# Patient Record
Sex: Female | Born: 1950 | Hispanic: No | Marital: Single | Smoking: Never smoker
Health system: Southern US, Community
[De-identification: ages and names within clinical notes are randomized; demographics above are authoritative.]

## PROBLEM LIST (undated history)

## (undated) DIAGNOSIS — K8020 Calculus of gallbladder without cholecystitis without obstruction: Secondary | ICD-10-CM

## (undated) DIAGNOSIS — C801 Malignant (primary) neoplasm, unspecified: Secondary | ICD-10-CM

## (undated) DIAGNOSIS — C7949 Secondary malignant neoplasm of other parts of nervous system: Secondary | ICD-10-CM

## (undated) DIAGNOSIS — Z9221 Personal history of antineoplastic chemotherapy: Secondary | ICD-10-CM

## (undated) DIAGNOSIS — R94131 Abnormal electromyogram [EMG]: Secondary | ICD-10-CM

## (undated) DIAGNOSIS — C7951 Secondary malignant neoplasm of bone: Secondary | ICD-10-CM

## (undated) DIAGNOSIS — D649 Anemia, unspecified: Secondary | ICD-10-CM

## (undated) DIAGNOSIS — C3490 Malignant neoplasm of unspecified part of unspecified bronchus or lung: Secondary | ICD-10-CM

## (undated) DIAGNOSIS — Z923 Personal history of irradiation: Secondary | ICD-10-CM

## (undated) DIAGNOSIS — E039 Hypothyroidism, unspecified: Secondary | ICD-10-CM

## (undated) DIAGNOSIS — M25511 Pain in right shoulder: Secondary | ICD-10-CM

## (undated) DIAGNOSIS — R599 Enlarged lymph nodes, unspecified: Secondary | ICD-10-CM

## (undated) DIAGNOSIS — R0602 Shortness of breath: Secondary | ICD-10-CM

## (undated) DIAGNOSIS — I639 Cerebral infarction, unspecified: Secondary | ICD-10-CM

## (undated) DIAGNOSIS — D242 Benign neoplasm of left breast: Secondary | ICD-10-CM

## (undated) DIAGNOSIS — C50919 Malignant neoplasm of unspecified site of unspecified female breast: Secondary | ICD-10-CM

## (undated) DIAGNOSIS — Z79811 Long term (current) use of aromatase inhibitors: Secondary | ICD-10-CM

## (undated) DIAGNOSIS — R05 Cough: Secondary | ICD-10-CM

## (undated) DIAGNOSIS — C7931 Secondary malignant neoplasm of brain: Secondary | ICD-10-CM

## (undated) DIAGNOSIS — R058 Other specified cough: Secondary | ICD-10-CM

## (undated) HISTORY — DX: Anemia, unspecified: D64.9

## (undated) HISTORY — PX: TONSILLECTOMY: SUR1361

## (undated) HISTORY — DX: Malignant (primary) neoplasm, unspecified: C80.1

## (undated) HISTORY — DX: Benign neoplasm of left breast: D24.2

## (undated) HISTORY — DX: Cerebral infarction, unspecified: I63.9

## (undated) HISTORY — DX: Abnormal electromyogram (EMG): R94.131

## (undated) HISTORY — DX: Hypothyroidism, unspecified: E03.9

## (undated) HISTORY — DX: Malignant neoplasm of unspecified part of unspecified bronchus or lung: C34.90

## (undated) HISTORY — DX: Personal history of irradiation: Z92.3

## (undated) HISTORY — DX: Malignant neoplasm of unspecified site of unspecified female breast: C50.919

## (undated) HISTORY — DX: Enlarged lymph nodes, unspecified: R59.9

## (undated) HISTORY — DX: Calculus of gallbladder without cholecystitis without obstruction: K80.20

---

## 1998-09-03 ENCOUNTER — Ambulatory Visit (HOSPITAL_COMMUNITY): Admission: RE | Admit: 1998-09-03 | Discharge: 1998-09-03 | Payer: Self-pay | Admitting: Family Medicine

## 1998-09-03 ENCOUNTER — Encounter: Payer: Self-pay | Admitting: Family Medicine

## 1998-09-15 ENCOUNTER — Ambulatory Visit (HOSPITAL_COMMUNITY): Admission: RE | Admit: 1998-09-15 | Discharge: 1998-09-15 | Payer: Self-pay | Admitting: Family Medicine

## 1998-09-18 ENCOUNTER — Encounter: Payer: Self-pay | Admitting: Family Medicine

## 1998-09-18 ENCOUNTER — Ambulatory Visit (HOSPITAL_COMMUNITY): Admission: RE | Admit: 1998-09-18 | Discharge: 1998-09-18 | Payer: Self-pay | Admitting: Family Medicine

## 2000-01-28 ENCOUNTER — Ambulatory Visit (HOSPITAL_COMMUNITY): Admission: RE | Admit: 2000-01-28 | Discharge: 2000-01-28 | Payer: Self-pay | Admitting: General Surgery

## 2001-03-29 ENCOUNTER — Encounter (INDEPENDENT_AMBULATORY_CARE_PROVIDER_SITE_OTHER): Payer: Self-pay | Admitting: *Deleted

## 2001-03-29 ENCOUNTER — Other Ambulatory Visit: Admission: RE | Admit: 2001-03-29 | Discharge: 2001-03-29 | Payer: Self-pay | Admitting: Family Medicine

## 2001-03-29 ENCOUNTER — Encounter: Admission: RE | Admit: 2001-03-29 | Discharge: 2001-03-29 | Payer: Self-pay | Admitting: Family Medicine

## 2001-03-29 ENCOUNTER — Encounter: Payer: Self-pay | Admitting: Family Medicine

## 2001-04-30 DIAGNOSIS — D242 Benign neoplasm of left breast: Secondary | ICD-10-CM

## 2001-04-30 DIAGNOSIS — C50919 Malignant neoplasm of unspecified site of unspecified female breast: Secondary | ICD-10-CM

## 2001-04-30 HISTORY — PX: BREAST LUMPECTOMY: SHX2

## 2001-04-30 HISTORY — DX: Benign neoplasm of left breast: D24.2

## 2001-04-30 HISTORY — DX: Malignant neoplasm of unspecified site of unspecified female breast: C50.919

## 2001-05-15 ENCOUNTER — Encounter: Payer: Self-pay | Admitting: General Surgery

## 2001-05-15 ENCOUNTER — Encounter: Admission: RE | Admit: 2001-05-15 | Discharge: 2001-05-15 | Payer: Self-pay | Admitting: General Surgery

## 2001-05-16 ENCOUNTER — Encounter: Admission: RE | Admit: 2001-05-16 | Discharge: 2001-05-16 | Payer: Self-pay | Admitting: General Surgery

## 2001-05-16 ENCOUNTER — Encounter (INDEPENDENT_AMBULATORY_CARE_PROVIDER_SITE_OTHER): Payer: Self-pay | Admitting: *Deleted

## 2001-05-16 ENCOUNTER — Ambulatory Visit (HOSPITAL_BASED_OUTPATIENT_CLINIC_OR_DEPARTMENT_OTHER): Admission: RE | Admit: 2001-05-16 | Discharge: 2001-05-16 | Payer: Self-pay | Admitting: General Surgery

## 2001-05-16 ENCOUNTER — Encounter: Payer: Self-pay | Admitting: General Surgery

## 2001-05-16 HISTORY — PX: BREAST LUMPECTOMY: SHX2

## 2001-05-25 ENCOUNTER — Ambulatory Visit: Admission: RE | Admit: 2001-05-25 | Discharge: 2001-08-23 | Payer: Self-pay | Admitting: Radiation Oncology

## 2001-06-13 ENCOUNTER — Encounter: Payer: Self-pay | Admitting: *Deleted

## 2001-06-13 ENCOUNTER — Ambulatory Visit (HOSPITAL_COMMUNITY): Admission: RE | Admit: 2001-06-13 | Discharge: 2001-06-13 | Payer: Self-pay | Admitting: *Deleted

## 2001-06-20 ENCOUNTER — Ambulatory Visit (HOSPITAL_COMMUNITY): Admission: RE | Admit: 2001-06-20 | Discharge: 2001-06-20 | Payer: Self-pay | Admitting: *Deleted

## 2001-06-20 ENCOUNTER — Encounter: Payer: Self-pay | Admitting: *Deleted

## 2001-09-26 ENCOUNTER — Ambulatory Visit: Admission: RE | Admit: 2001-09-26 | Discharge: 2001-12-25 | Payer: Self-pay | Admitting: Radiation Oncology

## 2001-12-14 ENCOUNTER — Encounter: Admission: RE | Admit: 2001-12-14 | Discharge: 2001-12-14 | Payer: Self-pay | Admitting: Radiation Oncology

## 2001-12-28 ENCOUNTER — Ambulatory Visit: Admission: RE | Admit: 2001-12-28 | Discharge: 2002-03-28 | Payer: Self-pay | Admitting: Radiation Oncology

## 2003-01-02 ENCOUNTER — Encounter: Admission: RE | Admit: 2003-01-02 | Discharge: 2003-01-02 | Payer: Self-pay | Admitting: *Deleted

## 2003-01-02 ENCOUNTER — Encounter: Payer: Self-pay | Admitting: *Deleted

## 2003-08-15 ENCOUNTER — Encounter: Admission: RE | Admit: 2003-08-15 | Discharge: 2003-08-15 | Payer: Self-pay | Admitting: Radiation Oncology

## 2003-11-17 ENCOUNTER — Encounter: Admission: RE | Admit: 2003-11-17 | Discharge: 2003-11-17 | Payer: Self-pay | Admitting: Family Medicine

## 2004-03-31 ENCOUNTER — Encounter (INDEPENDENT_AMBULATORY_CARE_PROVIDER_SITE_OTHER): Payer: Self-pay | Admitting: *Deleted

## 2004-03-31 LAB — CONVERTED CEMR LAB

## 2004-04-27 ENCOUNTER — Encounter: Admission: RE | Admit: 2004-04-27 | Discharge: 2004-04-27 | Payer: Self-pay | Admitting: Family Medicine

## 2004-08-18 ENCOUNTER — Encounter: Admission: RE | Admit: 2004-08-18 | Discharge: 2004-08-18 | Payer: Self-pay

## 2004-12-01 ENCOUNTER — Ambulatory Visit: Payer: Self-pay | Admitting: Family Medicine

## 2005-03-09 ENCOUNTER — Ambulatory Visit: Payer: Self-pay | Admitting: Family Medicine

## 2005-09-01 ENCOUNTER — Encounter: Admission: RE | Admit: 2005-09-01 | Discharge: 2005-09-01 | Payer: Self-pay | Admitting: General Surgery

## 2006-02-21 ENCOUNTER — Encounter: Admission: RE | Admit: 2006-02-21 | Discharge: 2006-02-21 | Payer: Self-pay | Admitting: Family Medicine

## 2006-03-01 ENCOUNTER — Encounter: Payer: Self-pay | Admitting: Family Medicine

## 2006-05-17 ENCOUNTER — Ambulatory Visit: Payer: Self-pay

## 2006-12-28 DIAGNOSIS — C50919 Malignant neoplasm of unspecified site of unspecified female breast: Secondary | ICD-10-CM | POA: Insufficient documentation

## 2006-12-28 DIAGNOSIS — E039 Hypothyroidism, unspecified: Secondary | ICD-10-CM | POA: Insufficient documentation

## 2006-12-28 DIAGNOSIS — R7303 Prediabetes: Secondary | ICD-10-CM

## 2006-12-29 ENCOUNTER — Encounter (INDEPENDENT_AMBULATORY_CARE_PROVIDER_SITE_OTHER): Payer: Self-pay | Admitting: *Deleted

## 2007-10-15 ENCOUNTER — Telehealth: Payer: Self-pay | Admitting: Family Medicine

## 2007-12-11 ENCOUNTER — Ambulatory Visit: Payer: Self-pay | Admitting: Family Medicine

## 2007-12-11 ENCOUNTER — Encounter: Payer: Self-pay | Admitting: Family Medicine

## 2007-12-13 LAB — CONVERTED CEMR LAB
AST: 15 units/L (ref 0–37)
Albumin: 4.6 g/dL (ref 3.5–5.2)
BUN: 10 mg/dL (ref 6–23)
CO2: 19 meq/L (ref 19–32)
Calcium: 9.5 mg/dL (ref 8.4–10.5)
Chloride: 107 meq/L (ref 96–112)
Cholesterol: 204 mg/dL — ABNORMAL HIGH (ref 0–200)
Creatinine, Ser: 0.75 mg/dL (ref 0.40–1.20)
Glucose, Bld: 110 mg/dL — ABNORMAL HIGH (ref 70–99)
HCT: 39.4 % (ref 36.0–46.0)
HDL: 44 mg/dL (ref >39)
Hemoglobin: 13.1 g/dL (ref 12.0–15.0)
RBC: 4.7 M/uL (ref 3.87–5.11)
RDW: 13.4 % (ref 11.5–15.5)
Total CHOL/HDL Ratio: 4.6
Triglycerides: 133 mg/dL (ref <150)

## 2007-12-14 ENCOUNTER — Telehealth: Payer: Self-pay | Admitting: *Deleted

## 2008-01-16 ENCOUNTER — Telehealth: Payer: Self-pay | Admitting: Family Medicine

## 2008-04-03 ENCOUNTER — Encounter: Admission: RE | Admit: 2008-04-03 | Discharge: 2008-04-03 | Payer: Self-pay | Admitting: Family Medicine

## 2008-07-09 ENCOUNTER — Telehealth: Payer: Self-pay | Admitting: Family Medicine

## 2008-07-24 ENCOUNTER — Telehealth: Payer: Self-pay | Admitting: Family Medicine

## 2008-07-24 ENCOUNTER — Encounter (INDEPENDENT_AMBULATORY_CARE_PROVIDER_SITE_OTHER): Payer: Self-pay | Admitting: *Deleted

## 2009-04-06 ENCOUNTER — Telehealth: Payer: Self-pay | Admitting: Family Medicine

## 2009-04-08 ENCOUNTER — Encounter: Admission: RE | Admit: 2009-04-08 | Discharge: 2009-04-08 | Payer: Self-pay | Admitting: Family Medicine

## 2009-04-09 ENCOUNTER — Ambulatory Visit: Payer: Self-pay | Admitting: Family Medicine

## 2009-04-09 ENCOUNTER — Encounter: Payer: Self-pay | Admitting: Family Medicine

## 2009-04-10 ENCOUNTER — Encounter: Payer: Self-pay | Admitting: Family Medicine

## 2009-04-13 ENCOUNTER — Telehealth: Payer: Self-pay | Admitting: Family Medicine

## 2009-04-16 ENCOUNTER — Telehealth: Payer: Self-pay | Admitting: Family Medicine

## 2010-04-09 ENCOUNTER — Ambulatory Visit: Payer: Self-pay | Admitting: Family Medicine

## 2010-04-09 ENCOUNTER — Encounter: Payer: Self-pay | Admitting: Family Medicine

## 2010-04-09 DIAGNOSIS — G4700 Insomnia, unspecified: Secondary | ICD-10-CM | POA: Insufficient documentation

## 2010-04-09 LAB — CONVERTED CEMR LAB
ALT: 17 units/L (ref 0–35)
Bilirubin Urine: NEGATIVE
Blood in Urine, dipstick: NEGATIVE
CO2: 24 meq/L (ref 19–32)
Calcium: 9.5 mg/dL (ref 8.4–10.5)
Chloride: 103 meq/L (ref 96–112)
Creatinine, Ser: 0.93 mg/dL (ref 0.40–1.20)
Glucose, Bld: 125 mg/dL — ABNORMAL HIGH (ref 70–99)
Glucose, Urine, Semiquant: NEGATIVE
HCT: 35.9 % — ABNORMAL LOW (ref 36.0–46.0)
Ketones, urine, test strip: NEGATIVE
MCV: 87.3 fL (ref 78.0–100.0)
Platelets: 250 10*3/uL (ref 150–400)
Protein, U semiquant: NEGATIVE
RBC: 4.11 M/uL (ref 3.87–5.11)
TSH: 14.954 microintl units/mL — ABNORMAL HIGH (ref 0.350–4.500)
Total Bilirubin: 0.4 mg/dL (ref 0.3–1.2)
Total Protein: 7.4 g/dL (ref 6.0–8.3)
Urobilinogen, UA: 0.2
WBC: 7 10*3/uL (ref 4.0–10.5)
pH: 5

## 2010-08-17 ENCOUNTER — Ambulatory Visit: Payer: Self-pay | Admitting: Family Medicine

## 2010-08-17 ENCOUNTER — Encounter: Admission: RE | Admit: 2010-08-17 | Discharge: 2010-08-17 | Payer: Self-pay | Admitting: Family Medicine

## 2010-08-17 ENCOUNTER — Encounter: Payer: Self-pay | Admitting: Family Medicine

## 2010-08-17 DIAGNOSIS — Z780 Asymptomatic menopausal state: Secondary | ICD-10-CM | POA: Insufficient documentation

## 2010-08-18 ENCOUNTER — Encounter: Payer: Self-pay | Admitting: Family Medicine

## 2010-08-18 LAB — CONVERTED CEMR LAB
ALT: 16 units/L (ref 0–35)
AST: 15 units/L (ref 0–37)
CO2: 23 meq/L (ref 19–32)
Chloride: 104 meq/L (ref 96–112)
Cholesterol: 188 mg/dL (ref 0–200)
Creatinine, Ser: 0.79 mg/dL (ref 0.40–1.20)
HCT: 38.5 % (ref 36.0–46.0)
MCHC: 32.5 g/dL (ref 30.0–36.0)
MCV: 84.1 fL (ref 78.0–100.0)
Platelets: 258 10*3/uL (ref 150–400)
RDW: 14.3 % (ref 11.5–15.5)
Sodium: 138 meq/L (ref 135–145)
TSH: 0.032 microintl units/mL — ABNORMAL LOW (ref 0.350–4.500)
Total Bilirubin: 0.4 mg/dL (ref 0.3–1.2)
Total Protein: 7.4 g/dL (ref 6.0–8.3)
Vit D, 25-Hydroxy: 24 ng/mL — ABNORMAL LOW (ref 30–89)
WBC: 7.4 10*3/uL (ref 4.0–10.5)

## 2010-09-02 ENCOUNTER — Telehealth: Payer: Self-pay | Admitting: *Deleted

## 2010-09-09 ENCOUNTER — Encounter: Payer: Self-pay | Admitting: Family Medicine

## 2010-09-15 ENCOUNTER — Telehealth: Payer: Self-pay | Admitting: Family Medicine

## 2010-09-20 DIAGNOSIS — K029 Dental caries, unspecified: Secondary | ICD-10-CM | POA: Insufficient documentation

## 2010-11-20 ENCOUNTER — Encounter: Payer: Self-pay | Admitting: General Surgery

## 2010-11-21 ENCOUNTER — Encounter: Payer: Self-pay | Admitting: General Surgery

## 2010-11-21 ENCOUNTER — Encounter: Payer: Self-pay | Admitting: Family Medicine

## 2010-11-22 ENCOUNTER — Encounter: Payer: Self-pay | Admitting: Family Medicine

## 2010-12-02 NOTE — Progress Notes (Signed)
Summary: referral question  Phone Note Call from Patient Call back at Home Phone [phone]   Caller: Patient Summary of Call: pt is requesting to know about dental referral Initial call taken by: De Nurse,  September 02, 2010 4:56 PM  Follow-up for Phone Call        was faxed on 08-17-10 Follow-up by: Arlyss Repress CMA,,  September 02, 2010 5:39 PM

## 2010-12-02 NOTE — Assessment & Plan Note (Signed)
Summary: chest discomfort & stomach discomfort,tcb   Vital Signs:  Patient profile:   60 year old female Weight:      136 pounds Pulse rate:   72 / minute BP sitting:   92 / 62  (left arm)  Vitals Entered By: Renato Battles slade,cma CC: chest and abdomen discomfort x 1 week. increased urination x 1 week. Is Patient Diabetic? No Pain Assessment Patient in pain? yes     Location: stomach Intensity: 7 Onset of pain  x 1 week   Primary Care Provider:  Jamie Brookes MD  CC:  chest and abdomen discomfort x 1 week. increased urination x 1 week.Marland Kitchen  History of Present Illness: Many complaints:  Thyroid problems-feels that her dose is very low cannot sleep gaining weight stomach is large acid reflux worried about diabetes Wants blood work done does not want hemoccult slides done  She wants to be healthy,looking for direction in this area.  She is not sure who she wants to be doctor. She has misunderstanding about being certified by the hospital and having insurance for diagnostic coverage.   Habits & Providers  Alcohol-Tobacco-Diet     Tobacco Status: never  Current Medications (verified): 1)  Levothroid 100 Mcg Tabs (Levothyroxine Sodium) .... One Daily  Allergies (verified): No Known Drug Allergies  Family History: Reviewed history from 12/28/2006 and no changes required. F - CAD, HTN, M - DM, no CA  Social History: Reviewed history from 12/28/2006 and no changes required. Lives alone, divorced, 1 son; no tobacco or EtOH; vegetarian; unemployed, degree in education  Review of Systems General:  Complains of fatigue and sleep disorder; denies sweats. GI:  Complains of abdominal pain, constipation, gas, and indigestion. Endo:  Complains of weight change.  Physical Exam  General:  Well developed, talkative, [place] female Lungs:  normal respiratory effort and normal breath sounds.   Heart:  normal rate and regular rhythm.   Abdomen:  soft, non-tender, normal bowel  sounds, and no distention.   Psych:  full of questions   Impression & Recommendations:  Problem # 1:  HYPOTHYROIDISM, UNSPECIFIED (ICD-244.9) TSH high, increase to 1 mg, recheck in 8 weeks The following medications were removed from the medication list:    Levothroid 50 Mcg Tabs (Levothyroxine sodium) .Marland Kitchen... Take one by mouth daily Her updated medication list for this problem includes:    Levothroid 100 Mcg Tabs (Levothyroxine sodium) ..... One daily  Orders: Comp Met-[facility] [id number]) CBC-[facility] [phone]) TSH-[facility] [phone]) [facility]- Est  Level 4 (99214)Future Orders: TSH-[facility] ([id number]) ... 04/07/2011  Problem # 2:  SCREENING, DIABETES MELLITUS (ICD-V77.1)  [place], not fasting, will have fasting bs done in August when she comes in for blood work, future orders entered  Orders: [facility]- Est  Level 4 (02725)  Problem # 3:  ACID REFLUX DISEASE (ICD-530.81)  recommended OTC H2 blocker for one month then prn The following medications were removed from the medication list:    Nexium 20 Mg Cpdr (Esomeprazole magnesium) .Marland Kitchen... Take one tablet by mouth daily in the morning Her updated medication list for this problem includes:    Pepcid 40 Mg Tabs (Famotidine) ..... Otc daily  Orders: [facility]- Est  Level 4 (36644)  Problem # 4:  INSOMNIA (ICD-780.52) recommended OTC melantonin  Complete Medication List: 1)  Levothroid 100 Mcg Tabs (Levothyroxine sodium) .... One daily 2)  Pepcid 40 Mg Tabs (Famotidine) .... Otc daily  Other Orders: Urinalysis-[facility] (00000) Future Orders: Glucose-[facility] ([id number]) ... 09/02/2011 Vit D, 25 OH-[facility] ([id number]) ... 04/07/2011 Lipid-[facility] (  [phone]) ... 05/06/2011  Patient Instructions: 1)  melatonin 3 mg at bedtime 2)  Pepcid 40 mg, take at betime for one month 3)  Exercise 40 minutes 5 times a week for one month 4)  Take thyroid medicine in the morning 5)  Return in one month Prescriptions: LEVOTHROID 100 MCG TABS (LEVOTHYROXINE SODIUM) one  daily  #90 x 3   Entered and Authorized by:   Luretha Murphy NP   Signed by:   Luretha Murphy NP on 04/12/2010   Method used:   Electronically to        [id number] (retail)       [address]       [place], [place]  [address]       Ph: [phone]       Fax: [phone]   RxID:   [id number]    Prevention & Chronic Care Immunizations   Influenza vaccine: Not documented    Tetanus booster: 10/31/2000: Done.    Pneumococcal vaccine: Not documented  Colorectal Screening   Hemoccult: Not documented    Colonoscopy: Not documented  Other Screening   Pap smear: Done.  (03/31/2004)    Mammogram: BI-RADS CATEGORY 2:  Benign finding(s).^MM DIGITAL DIAGNOSTIC BILAT  (04/08/2009)   Smoking status: never  (04/09/2010)  Diabetes Mellitus   HgbA1C: 6.6   (04/09/2009)    Eye exam: Not documented    Foot exam: Not documented   High risk foot: Not documented   Foot care education: Not documented    Urine microalbumin/creatinine ratio: Not documented  Lipids   Total Cholesterol: 204  (12/11/2007)   LDL: 133  (12/11/2007)   LDL Direct: Not documented   HDL: 44  (12/11/2007)   Triglycerides: 133  (12/11/2007)  Self-Management Support :    Diabetes self-management support: Not documented    Laboratory Results   Urine Tests  Date/Time Received: April 09, 2010 4:05 PM  Date/Time Reported: April 09, 2010 4:14 PM   Routine Urinalysis   Color: yellow Appearance: Clear Glucose: negative   (Normal Range: Negative) Bilirubin: negative   (Normal Range: Negative) Ketone: negative   (Normal Range: Negative) Spec. Gravity: <1.005   (Normal Range: 1.003-1.035) Blood: negative   (Normal Range: Negative) pH: 5.0   (Normal Range: 5.0-8.0) Protein: negative   (Normal Range: Negative) Urobilinogen: 0.2   (Normal Range: 0-1) Nitrite: negative   (Normal Range: Negative) Leukocyte Esterace: negative   (Normal Range: Negative)     Comments: ...............test performed by......Marland KitchenBonnie A. [place], MLS (ASCP)cm

## 2010-12-02 NOTE — Letter (Signed)
Summary: Generic Letter  [facility]  [address]   [place], [place] [address]   Phone: [phone]  Fax: [phone]    08/18/2010  [facility] [address] [place], [place]  [address]  Dear Ms. [facility],   It was nice to see you and talk with you on the phone.  As we discussed I would like you to: 1. Begin a walking program for 30 minutes daily 2. Begin Vitamin D 2000 International Units daily 3. At your next refill, the dosage of your thyroid replacement will change, the prescription is enclosed.  Please schedule a follow up appointment in 3-4 months.       Sincerely,   Luretha Murphy NP

## 2010-12-02 NOTE — Assessment & Plan Note (Signed)
Summary: F/U VISIT/[facility]   Vital Signs:  Patient profile:   60 year old female Height:      60 inches Weight:      131 pounds BMI:     25.68 Temp:     97.7 degrees F oral Pulse rate:   90 / minute BP sitting:   113 / 77  (right arm) Cuff size:   regular  Vitals Entered By: Tessie Fass CMA (August 17, 2010 8:48 AM) CC: F/U Thyroid Is Patient Diabetic? No Pain Assessment Patient in pain? yes     Location: lower back Intensity: 8   Primary Care Provider:  Jamie Brookes MD  CC:  F/U Thyroid.  History of Present Illness: Here to have TSH checked, was elevated at last visit in June, dose was increased to .  She complains of: back pain, worse when she sits for any length of time, ibuprofen is helpful  hair falling out-thinning is around face and on top.  being overweight, abdomen is larger than she would like, exercises some   Habits & Providers  Alcohol-Tobacco-Diet     Tobacco Status: never  Current Medications (verified): 1)  Levothroid 88 Mcg Tabs (Levothyroxine Sodium) .... One Daily (Dosage Change) 2)  Hydrocortisone 2.5 % Crea (Hydrocortisone) .... Apply To Rash Two Times A Day As Needed, 30 Gm 3)  Vitamin D 2000 Unit Caps (Cholecalciferol) .... One Daily  Allergies (verified): No Known Drug Allergies  Social History: Lives alone, divorced, 1 son; no tobacco or EtOH; vegetarian; unemployed, degree in education Originally from [place]  Review of Systems General:  Denies fatigue, malaise, sweats, and weight loss. CV:  Denies chest pain or discomfort. Resp:  Denies cough and shortness of breath. GI:  Denies abdominal pain and constipation. GU:  Denies discharge and dysuria. MS:  Complains of low back pain. Derm:  Complains of rash. Endo:  Denies cold intolerance, excessive hunger, excessive thirst, excessive urination, heat intolerance, polyuria, and weight change.  Physical Exam  General:  Well appearing [place] female, talks very fast. Mouth:   moderate gingivitis, cares and few broken teeth Lungs:  normal respiratory effort and normal breath sounds.   Heart:  Rate 90, regular, no murmur Abdomen:  distended but soft; active bowel sounds Msk:  normal ROM, no joint tenderness, and no joint swelling.     Detailed Back/Spine Exam  General:    Well-developed, well-nourished, in no acute distress; alert and oriented x 3.    Lumbosacral Exam:  Inspection-deformity:    Normal    normal lordosis Palpation-spinal tenderness:  Normal Range of Motion:    Forward Flexion:   90 degrees    Hyperextension:   5 degrees    Right Lateral Bend:   25 degrees    Left Lateral Bend:   25 degrees Lying Straight Leg Raise:    Right:  negative    Left:  negative Sitting Straight Leg Raise:    Right:  negative    Left:  negative Reverse Straight Leg Raise:    Right:  negative    Left:  negative Contralateral Straight Leg Raise:    Right:  negative    Left:  negative Sciatic Notch:    There is no sciatic notch tenderness. Toe Walking:    Right:  normal Heel Walking:    Right:  normal Patrick's Maneuver:    Right:  negative Fabere Test:    Right:  negative   Impression & Recommendations:  Problem # 1:  HYPOTHYROIDISM, UNSPECIFIED (ICD-244.9)  Overreplaced on 100 micrograms, decrease dosage Her updated medication list for this problem includes:    Levothroid 88 Mcg Tabs (Levothyroxine sodium) ..... One daily (dosage change)  Orders: TSH-[facility] ([id number]) [facility]- Est  Level 4 (99214)  Problem # 2:  LOW BACK PAIN, MILD (ICD-724.2) Gave handout of low back pain exercises, walk daily, stretch daily  Problem # 3:  DENTAL PAIN (ICD-525.9) referral Orders: [facility]- Est  Level 4 (80998) Dental Referral (Dentist)  Problem # 4:  UNSPECIFIED ANEMIA (ICD-285.9) recheck Orders: CBC-[facility] (33825) [facility]- Est  Level 4 (05397)  Problem # 5:  SCREENING, DIABETES MELLITUS (ICD-V77.1) Blood sugar mildely elevated, promote arobic  exercise  Problem # 6:  BREAST CANCER (ICD-174.9) mammogram with no signs of recurrance, she does not see Onc for routine follow up.  Complete Medication List: 1)  Levothroid 88 Mcg Tabs (Levothyroxine sodium) .... One daily (dosage change) 2)  Hydrocortisone 2.5 % Crea (Hydrocortisone) .... Apply to rash two times a day as needed, 30 gm 3)  Vitamin D 2000 Unit Caps (Cholecalciferol) .... One daily  Other Orders: Lipid-[facility] ([id number]) Comp Met-[facility] [id number]) Vit D, 25 OH-[facility] ([id number])  Patient Instructions: 1)  Keep up your exercise 2)  Do back exercises 3)  Change thyroid replacement dosage as instructed 4)  Vitamin D 2000 International Units daily 5)  return in 3 months Prescriptions: LEVOTHROID 88 MCG TABS (LEVOTHYROXINE SODIUM) one daily (dosage change)  #31 x 6   Entered and Authorized by:   Luretha Murphy NP   Signed by:   Luretha Murphy NP on 08/18/2010   Method used:   Electronically to        [id number] (retail)       [address]       [place], [place]  [address]       Ph: [phone]       Fax: [phone]   RxID:   [id number] HYDROCORTISONE 2.5 % CREA (HYDROCORTISONE) apply to rash two times a day as needed, 30 gm  #1 x 1   Entered and Authorized by:   Luretha Murphy NP   Signed by:   Luretha Murphy NP on 08/17/2010   Method used:   Electronically to        [id number] (retail)       [address]       [place], [place]  [address]       Ph: [phone]       Fax: [phone]   RxID:   [id number] LEVOTHROID 100 MCG TABS (LEVOTHYROXINE SODIUM) one daily  #31 x 6   Entered and Authorized by:   Luretha Murphy NP   Signed by:   Luretha Murphy NP on 08/17/2010   Method used:   Electronically to        [id number] (retail)       [address]       [place], [place]  [address]       Ph: [phone]       Fax: [phone]   RxID:   [id number]    Orders Added: 1)  Lipid-[facility]  [[id number]] 2)  Comp Met-[facility] [[id number]] 3)  CBC-[facility] [85027] 4)  TSH-[facility] [[id number]] 5)  Vit D, 25 OH-[facility] [[id number]] 6)  [facility]- Est  Level 4 [37342] 7)  Dental Referral [Dentist]

## 2010-12-02 NOTE — Progress Notes (Signed)
Summary: phn msg  Phone Note Call from Patient Call back at Home Phone [phone]   Caller: Patient Summary of Call: would like to talk to Darl Pikes about pain meds - pls call  Initial call taken by: De Nurse,  September 15, 2010 4:47 PM    New/Updated Medications: PENICILLIN V POTASSIUM 250 MG TABS (PENICILLIN V POTASSIUM) one three times a day for 2 weeks TRAMADOL HCL 50 MG TABS (TRAMADOL HCL) one three times a day as needed pain Prescriptions: TRAMADOL HCL 50 MG TABS (TRAMADOL HCL) one three times a day as needed pain  #90 x 0   Entered and Authorized by:   Luretha Murphy NP   Signed by:   Luretha Murphy NP on 09/20/2010   Method used:   Electronically to        [id number] (retail)       [address]       [place], [place]  [address]       Ph: [phone]       Fax: [phone]   RxID:   [id number] PENICILLIN V POTASSIUM 250 MG TABS (PENICILLIN V POTASSIUM) one three times a day for 2 weeks  #42 x 1   Entered and Authorized by:   Luretha Murphy NP   Signed by:   Luretha Murphy NP on 09/20/2010   Method used:   Electronically to        [id number] (retail)       [address]       [place], [place]  [address]       Ph: [phone]       Fax: [phone]   RxID:   [id number]   Appended Document: dental referral/ts faxed request to the dental clinic.Arlyss Repress CMA,  September 20, 2010 2:43 PM    Clinical Lists Changes  Problems: Added new problem of DENTAL CARIES (ICD-521.00) Orders: Added new Referral order of Dental Referral (Dentist) - Signed

## 2010-12-02 NOTE — Miscellaneous (Signed)
  Clinical Lists Changes  Problems: Removed problem of LOW BACK PAIN, MILD (ICD-724.2) Removed problem of DENTAL PAIN (ICD-525.9) Removed problem of UNSPECIFIED ANEMIA (ICD-285.9) Removed problem of SCREENING OTHER&UNSPEC CARDIOVASCULAR CONDITIONS (ICD-V81.2) Removed problem of SCREENING, DIABETES MELLITUS (ICD-V77.1)

## 2010-12-08 ENCOUNTER — Encounter: Payer: Self-pay | Admitting: *Deleted

## 2011-03-18 NOTE — Op Note (Signed)
[place]  Patient:    Debbie Larsen, Debbie Larsen                         MRN: [id number] Proc. Date: 05/16/01 Adm. Date:  [id number] Attending:  Janalyn Rouse CC:         Arvella Merles, M.D.  Dr. Heath Lark   Operative Report  PREOPERATIVE DIAGNOSIS: 1. Carcinoma of the right breast. 2. Fibroadenoma of the left breast.  POSTOPERATIVE DIAGNOSIS: 1. Carcinoma of the right breast. 2. Fibroadenoma of the left breast.  OPERATION PERFORMED: 1. Blue dye injection. 2. Right axillary sentinel lymph node biopsy. 3. Right partial mastectomy with needle localization and specimen mammography. 4. Excision of left breast mass with needle localization and specimen    mammography.  SURGEON:  Rose Phi. Maple Hudson, M.D.  ANESTHESIA:  General.  DESCRIPTION OF PROCEDURE:  Prior to coming to the operating room the patient had wire localizations done of the presumed fibroadenoma of the left breast and the primary malignant tumor in the right breast.  Also she had 1 mCi of technetium sulfur colloid injected intradermally in the periareolar tissue prior to coming to the operating room.  After she was put to sleep, 5 cc of Lymphazurin blue was injected in the subareolar tissue on the right side and the breast was compressed for five minutes.  After suitable general anesthesia was induced, the patient was placed in supine position with both arms extended on arm boards.  Both breast axillae and arms were prepped and draped in the usual fashion.  We did the left side first which was the presumed benign tumor.  Using the previously placed wire as our guide, a curved incision was made overlying about the 12 oclock position of the left breast and, using the cautery, we identified the wire and then widely excised the wire and surrounding tissue.  Hemostasis was obtained with the cautery.  Specimen mammography confirmed the removal of the lesion. The deeper breast  tissue was approximated with 3-0 Vicryl and the skin with subcuticular 4-0 Monocryl and Steri-Strips.  On the right side, we outlined a curvilinear incision over the presumed area of the primary tumor as defined by the previously placed wire.  This was in the upper outer quadrant of the right breast.  Incision was made and a wide excision was carried out using the wire as a guide.  The specimen was then oriented for the pathologist and submitted for specimen mammography and then to the pathologist for Touch Prep evaluation and margins.  While that was being done, a short transverse incision was made in the right axilla.  We dissected through the subcutaneous tissues to the clavipectoral fascia.   Self-retaining retractor inserted.  Using the Neoprobe as a guide, a hot and blue lymph node was identified and removed.  This was identified as sentinel  node #1.  There was then a deeper node which was not but not very blue and we removed it also and that was submitted as sentinel node #2.  Both of these were then sent to the pathologist for Touch Prep for metastatic disease.  Specimen mammography confirmed removal of the primary lesion and Touch Preps on the margins of the margins of the primary lesion showed clean margins.  The Touch Preps on the nodes were also negative.  Both incisions were closed with 3-0 Vicryl and subcuticular 4-0 Monocryl and Steri-Strips.  Dressing was applied.  The patient was  transferred to the recovery room in satisfactory condition having tolerated the procedure well. DD:  05/16/01 TD:  05/16/01 Job: [id number]

## 2012-01-03 ENCOUNTER — Ambulatory Visit: Payer: Self-pay | Admitting: Family Medicine

## 2012-03-01 ENCOUNTER — Ambulatory Visit (INDEPENDENT_AMBULATORY_CARE_PROVIDER_SITE_OTHER): Payer: Self-pay | Admitting: Family Medicine

## 2012-03-01 ENCOUNTER — Encounter: Payer: Self-pay | Admitting: Family Medicine

## 2012-03-01 VITALS — BP 123/77 | HR 71 | Temp 98.6°F | Wt 135.2 lb

## 2012-03-01 DIAGNOSIS — E039 Hypothyroidism, unspecified: Secondary | ICD-10-CM

## 2012-03-01 DIAGNOSIS — M25511 Pain in right shoulder: Secondary | ICD-10-CM | POA: Insufficient documentation

## 2012-03-01 DIAGNOSIS — R0789 Other chest pain: Secondary | ICD-10-CM

## 2012-03-01 DIAGNOSIS — M5412 Radiculopathy, cervical region: Secondary | ICD-10-CM

## 2012-03-01 LAB — TSH: TSH: 0.104 u[IU]/mL — ABNORMAL LOW (ref 0.350–4.500)

## 2012-03-01 MED ORDER — IBUPROFEN 600 MG PO TABS: 600.0000 mg | ORAL_TABLET | Freq: Four times a day (QID) | ORAL | Status: AC | PRN

## 2012-03-01 NOTE — Assessment & Plan Note (Signed)
No pain currently. Atypical. No consistent with cardiac chest pain. Tenderness to palpation suggest MSK chest pain.  Plan: -NSAIDs -Check TSH, lipids and fasting blood sugar

## 2012-03-01 NOTE — Assessment & Plan Note (Signed)
R shoulder pain with positive impingement signs.  Plan: Stop tylenol Start NAIDs Heat  Check labs to rule out inflammatory arthritis: uric acid, RF, ANA, ESR Patient to follow-up with PCP when back in town in 6 weeks. If labs wnl and pain not significantly improved recommend repeat shoulder exam and steroid injection in office or at [facility]. Patient seems interested in referral to [facility]. She does not seem interested in injection.

## 2012-03-01 NOTE — Assessment & Plan Note (Signed)
Compliant with synthroid. Check TSH.

## 2012-03-01 NOTE — Patient Instructions (Addendum)
Debbie Larsen,  Thank you for coming in today.  Please do the following for your R arm and chest pain: -start motrin 600 mg every 6 hrs for next 2 days, then as needed.  -start exercises -continue to use heat  I will call you with the results of your blood work.  F/u in 6 weeks with your new PCP Dr. Earnest Bailey to follow-up pain and discuss chronic problem.  Dr. Armen Pickup

## 2012-03-01 NOTE — Progress Notes (Signed)
Subjective:     Patient ID: Debbie Larsen, female   DOB: 10-Jun-1951, 61 y.o.   MRN: [id number]  HPI 61 yo F presents for same day visit with the following concerns: 1. Would like TSH checked: taking synthroid. TSH last checked in 2011.  2. R shoulder pain: x 3 weeks. No injury. Pain in posterior shoulder and lateral upper arm. Pain is 8/10. Comes and goes. Pain is exacerbated activity and palpation. She does not work and denies repetitive motion in the shoulder. Pain improved with rest and tylenol. She denies rash and other joint pains.   3. Chest pain: Not currently. Last occurred 3 weeks ago. Located L chest over heart and L lower ribs. Descriebd as sharp. 8/10 in severity.  Comes and goes. Occurs at rest and with activity. Nothing seems to make it better or worse. Associated with nausea, sweating, SOB and fatigue. Not associated with dizziness or presyncope. Not associated with jaw pain. But patient reports that she had an episode of severe R sided jaw pain and tightness (inability to open mouth) yesterday.   Review of Systems As pre HPI     Objective:   Physical Exam BP 123/77  Pulse 71  Temp(Src) 98.6 F (37 C) (Oral)  Wt 135 lb 3.2 oz (61.326 kg) General appearance: alert, cooperative and no distress Neck: no adenopathy, no carotid bruit, no JVD, supple, symmetrical, trachea midline and thyroid not enlarged, symmetric, no tenderness/mass/nodules Lungs: clear to auscultation bilaterally Chest: chest pain reproducible with palpation over precordium and L lower ribs Heart: S1S2, RRR, no murmurs, rubs or gallops Shoulders: symmetrical, little muscle mass, full ROM, tender to palpation anterior shoulder and lateral upper arm. Positve empty-can sign. Motor full. Sensory full. No hand edema. 2+ radial pulse.   Assessment and Plan:  See problem list

## 2012-03-02 LAB — COMPREHENSIVE METABOLIC PANEL
ALT: 16 U/L (ref 0–35)
BUN: 10 mg/dL (ref 6–23)
CO2: 24 mEq/L (ref 19–32)
Calcium: 9.6 mg/dL (ref 8.4–10.5)
Chloride: 106 mEq/L (ref 96–112)
Creat: 0.74 mg/dL (ref 0.50–1.10)
Glucose, Bld: 104 mg/dL — ABNORMAL HIGH (ref 70–99)
Total Bilirubin: 0.3 mg/dL (ref 0.3–1.2)

## 2012-03-02 LAB — LIPID PANEL
Cholesterol: 202 mg/dL — ABNORMAL HIGH (ref 0–200)
HDL: 41 mg/dL (ref >39)
LDL Cholesterol: 124 mg/dL — ABNORMAL HIGH (ref 0–99)
Triglycerides: 185 mg/dL — ABNORMAL HIGH (ref <150)

## 2012-03-02 LAB — RHEUMATOID FACTOR: Rhuematoid fact SerPl-aCnc: <10 IU/mL (ref <=14)

## 2012-03-02 LAB — URIC ACID: Uric Acid, Serum: 6.1 mg/dL (ref 2.4–7.0)

## 2012-03-02 LAB — SEDIMENTATION RATE: Sed Rate: 25 mm/hr — ABNORMAL HIGH (ref 0–22)

## 2012-03-07 ENCOUNTER — Telehealth: Payer: Self-pay | Admitting: Family Medicine

## 2012-03-07 DIAGNOSIS — M5412 Radiculopathy, cervical region: Secondary | ICD-10-CM

## 2012-03-07 NOTE — Telephone Encounter (Signed)
FWD to Dr. Armen Pickup she seen this patient

## 2012-03-07 NOTE — Telephone Encounter (Signed)
Pt is asking to speak to Dr Armen Pickup- was here last week and was supposed to get her results from her test and is insisting that someone call her before the end of today.  Earnest Bailey is her PCP

## 2012-03-08 ENCOUNTER — Encounter: Payer: Self-pay | Admitting: Family Medicine

## 2012-03-08 MED ORDER — LEVOTHYROXINE SODIUM 75 MCG PO TABS: 75.0000 ug | ORAL_TABLET | Freq: Every day | ORAL | Status: DC

## 2012-03-08 NOTE — Telephone Encounter (Signed)
Called patient gave lab results. TSH low. Have decreased synthroid to 75 mcg daily. Will need TSH rechecked in 2-3 months. Will need to f/u with PCP.   Also placed referral to sports medicine for persistent R shoulder pain. Informed patient that she will receive a call from sports medicine.

## 2012-03-08 NOTE — Telephone Encounter (Signed)
Patient is calling back for her results and she has been waiting for a week.  She would like to speak to Dr. Armen Pickup.

## 2012-05-11 ENCOUNTER — Emergency Department (HOSPITAL_COMMUNITY): Payer: Self-pay

## 2012-05-11 ENCOUNTER — Encounter (HOSPITAL_COMMUNITY): Payer: Self-pay | Admitting: *Deleted

## 2012-05-11 ENCOUNTER — Inpatient Hospital Stay (HOSPITAL_COMMUNITY)
Admission: EM | Admit: 2012-05-11 | Discharge: 2012-05-12 | DRG: 066 | Disposition: A | Discharge status: Home / Self Care | Payer: Self-pay | Attending: Family Medicine | Admitting: Family Medicine

## 2012-05-11 DIAGNOSIS — R42 Dizziness and giddiness: Secondary | ICD-10-CM | POA: Diagnosis present

## 2012-05-11 DIAGNOSIS — H8110 Benign paroxysmal vertigo, unspecified ear: Secondary | ICD-10-CM

## 2012-05-11 DIAGNOSIS — I639 Cerebral infarction, unspecified: Secondary | ICD-10-CM | POA: Diagnosis present

## 2012-05-11 DIAGNOSIS — Z853 Personal history of malignant neoplasm of breast: Secondary | ICD-10-CM

## 2012-05-11 DIAGNOSIS — I6350 Cerebral infarction due to unspecified occlusion or stenosis of unspecified cerebral artery: Principal | ICD-10-CM | POA: Diagnosis present

## 2012-05-11 DIAGNOSIS — Z780 Asymptomatic menopausal state: Secondary | ICD-10-CM

## 2012-05-11 DIAGNOSIS — E039 Hypothyroidism, unspecified: Secondary | ICD-10-CM | POA: Diagnosis present

## 2012-05-11 LAB — POCT I-STAT, CHEM 8
BUN: 7 mg/dL (ref 6–23)
Calcium, Ion: 1.17 mmol/L (ref 1.13–1.30)
Chloride: 108 mEq/L (ref 96–112)
Glucose, Bld: 128 mg/dL — ABNORMAL HIGH (ref 70–99)
HCT: 36 % (ref 36.0–46.0)
TCO2: 22 mmol/L (ref 0–100)

## 2012-05-11 LAB — DIFFERENTIAL
Basophils Relative: 0 % (ref 0–1)
Lymphocytes Relative: 29 % (ref 12–46)
Monocytes Relative: 7 % (ref 3–12)
Neutro Abs: 4 10*3/uL (ref 1.7–7.7)
Neutrophils Relative %: 63 % (ref 43–77)

## 2012-05-11 LAB — POCT I-STAT TROPONIN I: Troponin i, poc: 0 ng/mL (ref 0.00–0.08)

## 2012-05-11 LAB — URINALYSIS, ROUTINE W REFLEX MICROSCOPIC
Bilirubin Urine: NEGATIVE
Ketones, ur: NEGATIVE mg/dL
Nitrite: NEGATIVE
Protein, ur: NEGATIVE mg/dL
pH: 6.5 (ref 5.0–8.0)

## 2012-05-11 LAB — PROTIME-INR: INR: 1.09 (ref 0.00–1.49)

## 2012-05-11 LAB — CBC
Hemoglobin: 12.2 g/dL (ref 12.0–15.0)
Hemoglobin: 12.5 g/dL (ref 12.0–15.0)
MCHC: 33.4 g/dL (ref 30.0–36.0)
RBC: 4.33 MIL/uL (ref 3.87–5.11)
RBC: 4.46 MIL/uL (ref 3.87–5.11)
WBC: 6.4 10*3/uL (ref 4.0–10.5)

## 2012-05-11 LAB — APTT: aPTT: 34 seconds (ref 24–37)

## 2012-05-11 LAB — CREATININE, SERUM
Creatinine, Ser: 0.75 mg/dL (ref 0.50–1.10)
GFR calc non Af Amer: >90 mL/min (ref >90)

## 2012-05-11 MED ORDER — MECLIZINE HCL 25 MG PO TABS
25.0000 mg | ORAL_TABLET | Freq: Once | ORAL | Status: AC
  Administered 2012-05-11: 25 mg via ORAL
  Filled 2012-05-11: qty 1

## 2012-05-11 MED ORDER — ACETAMINOPHEN 325 MG PO TABS: 650.0000 mg | ORAL_TABLET | ORAL | Status: DC | PRN

## 2012-05-11 MED ORDER — LEVOTHYROXINE SODIUM 75 MCG PO TABS
75.0000 ug | ORAL_TABLET | Freq: Every day | ORAL | Status: DC
  Administered 2012-05-11: 75 ug via ORAL
  Filled 2012-05-11 (×2): qty 1

## 2012-05-11 MED ORDER — SODIUM CHLORIDE 0.9 % IV SOLN
INTRAVENOUS | Status: DC
  Administered 2012-05-11: 22:00:00 via INTRAVENOUS
  Administered 2012-05-12: 995 mL via INTRAVENOUS

## 2012-05-11 MED ORDER — ASPIRIN 325 MG PO TABS
325.0000 mg | ORAL_TABLET | Freq: Every day | ORAL | Status: DC
  Administered 2012-05-12: 325 mg via ORAL
  Filled 2012-05-11: qty 1

## 2012-05-11 MED ORDER — SIMVASTATIN 40 MG PO TABS
40.0000 mg | ORAL_TABLET | Freq: Every day | ORAL | Status: DC
  Filled 2012-05-11 (×2): qty 1

## 2012-05-11 MED ORDER — SENNOSIDES-DOCUSATE SODIUM 8.6-50 MG PO TABS: 1.0000 | ORAL_TABLET | Freq: Every evening | ORAL | Status: DC | PRN

## 2012-05-11 MED ORDER — ENOXAPARIN SODIUM 40 MG/0.4ML ~~LOC~~ SOLN
40.0000 mg | SUBCUTANEOUS | Status: DC
  Filled 2012-05-11 (×2): qty 0.4

## 2012-05-11 MED ORDER — MECLIZINE HCL 25 MG PO TABS
25.0000 mg | ORAL_TABLET | Freq: Three times a day (TID) | ORAL | Status: DC | PRN
  Administered 2012-05-12 (×3): 25 mg via ORAL
  Filled 2012-05-11 (×3): qty 1

## 2012-05-11 MED ORDER — ONDANSETRON HCL 4 MG/2ML IJ SOLN: 4.0000 mg | Freq: Four times a day (QID) | INTRAMUSCULAR | Status: DC | PRN

## 2012-05-11 MED ORDER — ACETAMINOPHEN 650 MG RE SUPP: 650.0000 mg | RECTAL | Status: DC | PRN

## 2012-05-11 MED ORDER — ASPIRIN 300 MG RE SUPP
300.0000 mg | Freq: Every day | RECTAL | Status: DC
  Filled 2012-05-11 (×2): qty 1

## 2012-05-11 MED ORDER — SODIUM CHLORIDE 0.9 % IV SOLN
Freq: Once | INTRAVENOUS | Status: AC
  Administered 2012-05-11: 15:00:00 via INTRAVENOUS

## 2012-05-11 NOTE — ED Notes (Signed)
Off floor for testing 

## 2012-05-11 NOTE — ED Notes (Signed)
Patient states dizziness improved.

## 2012-05-11 NOTE — ED Notes (Signed)
Pt reports dizziness started Wednesday night after mowing the lawn and showering.  Pt also reports weakness, falling and vomiting Wednesday, but able to keep fluids and food down since. Pt reports laying down increases dizziness. Pt reports chest pain and headache on Wednesday, but denies pain at this time.  Pt reports she feels unsteady walking.  Pt denies history of similar issues

## 2012-05-11 NOTE — ED Provider Notes (Signed)
History     CSN: [id number]  Arrival date & time 05/11/12  4098   First MD Initiated Contact with Patient 05/11/12 1008      Chief Complaint  Patient presents with  . Dizziness  . Nausea    (Consider location/radiation/quality/duration/timing/severity/associated sxs/prior treatment) HPI Comments: Pt is a 61yo female who presents with complaint of dizziness, weakness, nausea, unsteady gait. Pt states two days ago, she mowed the yard, but was feeling well. States was able to eat dinner, go to bed. States woke up early morning not feeling well. States got up, was feeling dizzy, fell while walking to the bathroom. States at that time, had a headache, nausea, diaphoresis, chest pain, unable to walk straght. States had to hold on to things as she walked. States since then, has been feeling dizzy and has had unstable gait. Denies any more chest pain, abdominal pain, nausea, vomiting. States dizziness and nausea worsens when turning her head and laying flat, states room is spinning. Denies dizziness when walking, states "I just feel like i am going to fall." No history of the same     Past Medical History  Diagnosis Date  . BREAST CANCER 04/2001    Qualifier: Diagnosis of  By: Knox Royalty    . 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 Thyroid disease     Past Surgical History  Procedure Date  . Breast lumpectomy 04/2001    excision of L breast fibroadenoma by Dr.   . Mastectomy partial / lumpectomy w/ axillary lymphadenectomy 04/2001    R breast     No family history on file.  History  Substance Use Topics  . Smoking status: Never Smoker   . Smokeless tobacco: Not on file  . Alcohol Use: No    OB History    Grav Para Term Preterm Abortions TAB SAB Ect Mult Living                  Review of Systems  Constitutional: Negative for fever and chills.  HENT: Negative for neck pain and neck stiffness.   Eyes: Negative for photophobia and redness.  Respiratory: Negative.     Cardiovascular: Negative.   Gastrointestinal: Negative for nausea, vomiting and abdominal pain.  Genitourinary: Negative for dysuria.  Musculoskeletal: Positive for gait problem. Negative for joint swelling and arthralgias.  Neurological: Positive for dizziness.    Allergies  Review of patient's allergies indicates no known allergies.  Home Medications   Current Outpatient Rx  Name Route Sig Dispense Refill  . ACETAMINOPHEN 500 MG PO TABS Oral Take 500 mg by mouth every 4 (four) hours as needed. For pain.    Marland Kitchen LEVOTHYROXINE SODIUM 75 MCG PO TABS Oral Take 1 tablet (75 mcg total) by mouth daily. (dosage change) 30 tablet 1    BP 123/77  Pulse 60  Temp 97.7 F (36.5 C) (Oral)  Resp 18  SpO2 99%  Physical Exam  Nursing note and vitals reviewed. Constitutional: She is oriented to person, place, and time. She appears well-developed and well-nourished. No distress.  HENT:  Head: Normocephalic and atraumatic.  Eyes: Conjunctivae and EOM are normal. Pupils are equal, round, and reactive to light.       No nystagmus  Neck: Neck supple.  Cardiovascular: Normal rate, regular rhythm and normal heart sounds.   Pulmonary/Chest: Effort normal and breath sounds normal. No respiratory distress. She has no wheezes. She has no rales.  Abdominal: Soft. Bowel sounds are normal. She exhibits no distension. There  is no tenderness.  Musculoskeletal: Normal range of motion. She exhibits no edema.  Neurological: She is alert and oriented to person, place, and time. No cranial nerve deficit. Coordination normal.       No pronator drift, normal coordination finger to nose, 5/5 and equal grip strength bilaterally.   Skin: Skin is warm and dry.  Psychiatric: She has a normal mood and affect.    ED Course  Procedures (including critical care time)   Date: 05/11/2012  Rate: 52  Rhythm: sinus bradycardia  QRS Axis: normal  Intervals: normal  ST/T Wave abnormalities: normal  Conduction  Disutrbances:none  Narrative Interpretation:   Old EKG Reviewed: none available  Pt with vertigo like symptoms vs a posterior stroke. Will get labs, MRI, meclizine.   Results for orders placed during the hospital encounter of 05/11/12  POCT I-STAT, CHEM 8      Component Value Range   Sodium 141  135 - 145 mEq/L   Potassium 4.1  3.5 - 5.1 mEq/L   Chloride 108  96 - 112 mEq/L   BUN 7  6 - 23 mg/dL   Creatinine, Ser 1.61  0.50 - 1.10 mg/dL   Glucose, Bld 096 (*) 70 - 99 mg/dL   Calcium, Ion 0.45  4.09 - 1.30 mmol/L   TCO2 22  0 - 100 mmol/L   Hemoglobin 12.2  12.0 - 15.0 g/dL   HCT 81.1  91.4 - 78.2 %  POCT I-STAT TROPONIN I      Component Value Range   Troponin i, poc 0.00  0.00 - 0.08 ng/mL   Comment 3           CBC      Component Value Range   WBC 6.4  4.0 - 10.5 K/uL   RBC 4.33  3.87 - 5.11 MIL/uL   Hemoglobin 12.2  12.0 - 15.0 g/dL   HCT 95.6  21.3 - 08.6 %   MCV 84.3  78.0 - 100.0 fL   MCH 28.2  26.0 - 34.0 pg   MCHC 33.4  30.0 - 36.0 g/dL   RDW 57.8  46.9 - 62.9 %   Platelets 238  150 - 400 K/uL  DIFFERENTIAL      Component Value Range   Neutrophils Relative 63  43 - 77 %   Neutro Abs 4.0  1.7 - 7.7 K/uL   Lymphocytes Relative 29  12 - 46 %   Lymphs Abs 1.9  0.7 - 4.0 K/uL   Monocytes Relative 7  3 - 12 %   Monocytes Absolute 0.4  0.1 - 1.0 K/uL   Eosinophils Relative 1  0 - 5 %   Eosinophils Absolute 0.0  0.0 - 0.7 K/uL   Basophils Relative 0  0 - 1 %   Basophils Absolute 0.0  0.0 - 0.1 K/uL  PROTIME-INR      Component Value Range   Prothrombin Time 14.3  11.6 - 15.2 seconds   INR 1.09  0.00 - 1.49  APTT      Component Value Range   aPTT 34  24 - 37 seconds   Mr Brain Wo Contrast  05/11/2012  *RADIOLOGY REPORT*  Clinical Data: Sudden onset of nausea and dizziness.  MRI HEAD WITHOUT CONTRAST  Technique:  Multiplanar, multiecho pulse sequences of the brain and surrounding structures were obtained according to standard protocol without intravenous contrast.   Comparison: None.  Findings: Diffusion imaging shows a punctate (2 mm) acute infarction affecting the left side  of the pons.  Otherwise, the brain has a normal appearance without any evidence of other old or acute infarction, mass lesion, hemorrhage, hydrocephalus or extra- axial collection.  No pituitary mass.  Sinuses, middle ears and mastoids are clear.  No skull or skull base lesion.  IMPRESSION: 2 mm acute infarction affecting the left side of the pons. Otherwise completely normal exam.  Original Report Authenticated By: Thomasenia Sales, M.D.    MRI positive for 2mm acute infarction in the left pons. Results discussed with pt and family in detail several times. Pt is a family practice pt. Will transfer.   Spoke with family practice will put in orders.   1. CVA (cerebral infarction)       MDM          Lottie Mussel, PA 05/11/12 1739

## 2012-05-11 NOTE — ED Notes (Addendum)
Patient removed tele leads-stated she doesn't need-refused to be placed back on monitoring-PA notified

## 2012-05-11 NOTE — ED Notes (Signed)
Patient ambulated 40 feet with no assistance. Patient's heart rate was constant at 64 beats a minute and O2 saturation was constant at 97%.

## 2012-05-11 NOTE — ED Notes (Signed)
Patient went to bathroom and did not get sample-encouraged patient to notify staff when she needs to go again

## 2012-05-11 NOTE — H&P (Signed)
Family Medicine Teaching [facility] Admission History and Physical  Patient name: Laressa Bolinger Medical record number: [id number] Date of birth: [birth year] Age: 61 y.o. Gender: female  Primary Care Provider: Delbert Harness, MD  Chief Complaint: dizziness History of Present Illness: Revonda Ivori Storr is a 61 y.o. year old female with hypothyroidism presenting with acute onset on dizziness yesterday morning.  She was in her usual state of good health until yesterday morning.  She reports being very dizzy and unsteady on her feet since she woke up yesterday with multiple falls over the course of the day.  States that she felt like the room was spinning and could not keep her balance.  Spinning improved with sitting and worsened with lying flat.  Associated with nausea and vomiting.  Did have some left sided chest pain and headache initially, but these resolved with a tylenol.  No focal weakness or paresthesias, no difficulty speaking or report of confusion.  Given continued dizziness, patient presented to [facility].  Patient does report having some dull chest pain after exercise (walking for >30 minutes) that is sometimes associated with dyspnea and diaphoresis.   ED Course: Found to have acute stroke on MRI.   Patient Active Problem List  Diagnosis  . BREAST CANCER  . HYPOTHYROIDISM, UNSPECIFIED  . GLUCOSE INTOLERANCE  . DENTAL CARIES  . INSOMNIA  . POSTMENOPAUSAL STATUS  . Radicular pain of shoulder  . Atypical chest pain   Past Medical History: Past Medical History  Diagnosis Date  . BREAST CANCER 04/2001    Qualifier: Diagnosis of  By: Knox Royalty    . 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 Thyroid disease     Past Surgical History: Past Surgical History  Procedure Date  . Breast lumpectomy 04/2001    excision of L breast fibroadenoma by Dr.   . Breast lumpectomy     Social History: History   Social History  . Marital Status: Divorced    Spouse Name: N/A    Number of  Children: N/A  . Years of Education: N/A   Social History Main Topics  . Smoking status: Never Smoker   . Smokeless tobacco: None  . Alcohol Use: No  . Drug Use: No  . Sexually Active:    Other Topics Concern  . None   Social History Narrative  . None    Family History: No family history on file.  Allergies: No Known Allergies  Current Facility-Administered Medications  Medication Dose Route Frequency Provider Last Rate Last Dose  . 0.9 %  sodium chloride infusion   Intravenous Once Tatyana A Kirichenko, PA 100 mL/hr at 05/11/12 1451    . 0.9 %  sodium chloride infusion   Intravenous Continuous Phebe Colla, MD      . acetaminophen (TYLENOL) tablet 650 mg  650 mg Oral Q4H PRN Phebe Colla, MD       Or  . acetaminophen (TYLENOL) suppository 650 mg  650 mg Rectal Q4H PRN Phebe Colla, MD      . aspirin suppository 300 mg  300 mg Rectal Daily Phebe Colla, MD       Or  . aspirin tablet 325 mg  325 mg Oral Daily Phebe Colla, MD      . enoxaparin (LOVENOX) injection 40 mg  40 mg Subcutaneous Q24H Phebe Colla, MD      . levothyroxine (SYNTHROID, LEVOTHROID) tablet 75 mcg  75 mcg Oral Daily Phebe Colla, MD      .  meclizine (ANTIVERT) tablet 25 mg  25 mg Oral Once Tatyana A Kirichenko, PA   25 mg at 05/11/12 1041  . meclizine (ANTIVERT) tablet 25 mg  25 mg Oral Once Tatyana A Kirichenko, PA   25 mg at 05/11/12 1446  . meclizine (ANTIVERT) tablet 25 mg  25 mg Oral TID PRN Phebe Colla, MD      . ondansetron [facility]) injection 4 mg  4 mg Intravenous Q6H PRN Phebe Colla, MD      . senna-docusate (Senokot-S) tablet 1 tablet  1 tablet Oral QHS PRN Phebe Colla, MD      . simvastatin (ZOCOR) tablet 40 mg  40 mg Oral q1800 Phebe Colla, MD       Review Of Systems: Per HPI with the following additions: none Otherwise 12 point review of systems was performed and was unremarkable.  Physical Exam: Pulse: 60  Blood Pressure: 111/57 RR: 18   O2: 100 on RA Temp: 98.5  General: alert,  cooperative, appears stated age and no distress HEENT: extra ocular movement intact, sclera clear, anicteric, oropharynx clear, no lesions and neck supple with midline trachea Heart: S1, S2 normal, no murmur, rub or gallop, regular rate and rhythm Lungs: clear to auscultation, no wheezes or rales and unlabored breathing Abdomen: abdomen is soft without significant tenderness, masses, organomegaly or guarding Extremities: extremities normal, atraumatic, no cyanosis or edema Skin:no rashes, no ecchymoses, no wounds Neurology: normal without focal findings, mental status, speech normal, alert and oriented x3, PERLA and cranial nerves 2-12 intact  Labs and Imaging:  Lab 05/11/12 2018 05/11/12 1145 05/11/12 1102  WBC 6.6 6.4 --  HGB 12.5 12.2 12.2  HCT 37.4 36.5 36.0  PLT 206 238 --    Lab 05/11/12 2018 05/11/12 1102  NA -- 141  K -- 4.1  CL -- 108  CO2 -- --  BUN -- 7  CREATININE 0.75 0.70  LABGLOM -- --  GLUCOSE -- 128*  CALCIUM -- --    Lab 05/11/12 1145  APTT 34  INR 1.09  PTT --   POC trop: negative UA: unremarkable  EKG: nsr, no ST changes  MRI Brain 2 mm acute infarction affecting the left side of the pons.  Otherwise completely normal exam.  Assessment and Plan: Treonna Sonny Poth is a 61 y.o. year old female with hypothyroidism presenting with acute stroke.  # Stroke: MRI showed small ischemic stroke in the left pons.  Primary symptom was vertigo, which is improved with meclizine. Unclear etiology.  Does have some risk factors with an A1c of 6.6 in 2010 (patient does not report diabetes) and an LDL of 124 in 02/2012.  No prior strokes, no hypertension, not taking aspirin at home.  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 - will check TSH, A1c, echo, carotids, and MRA - NPO for now - speech to evaluate in AM as failed bedside in [facility] - PT/OT evaluation - will start simvastatin 40mg  daily for secondary prevention - will start full dose aspirin for  secondary prevention - discuss neurology consult in the morning  # Vertigo: Likely related to acute stroke given location. - meclizine 25mg  q8hr prn - PT/OT for vestibular exercises (although these will likely be of limited use as etiology is likely central)  # Hypothyroidism: TSH was low at recent check in 02/2012.  Dose was decreased at that time. - will recheck TSH  # Healthcare maintenance:  With report of chest pain after exercise, may be a  good candidate for an outpatient exercise stress test.  Also needs outpatient colonoscopy.  Patient asking lots of questions regarding malignancy being cause for stroke and concern that if her brain vessels are bad the vessels around her heart may also be bad.  Discussed extensively, that these are things that will happen outpatient.   FEN/GI: NPO; NS at 100cc/hr Prophylaxis: SQ lovenox Disposition: admit to floor for stroke work up  BOOTH, ERIN 05/11/2012, 9:00 PM

## 2012-05-11 NOTE — ED Notes (Signed)
Patient states no dysphagia prior to/after dizzy episode

## 2012-05-11 NOTE — ED Notes (Signed)
Report to [facility] for [facility]

## 2012-05-11 NOTE — ED Notes (Signed)
Attempted to call report x2-placed on hold

## 2012-05-12 ENCOUNTER — Inpatient Hospital Stay (HOSPITAL_COMMUNITY): Payer: Self-pay

## 2012-05-12 DIAGNOSIS — I6350 Cerebral infarction due to unspecified occlusion or stenosis of unspecified cerebral artery: Secondary | ICD-10-CM

## 2012-05-12 LAB — TSH: TSH: 0.195 u[IU]/mL — ABNORMAL LOW (ref 0.350–4.500)

## 2012-05-12 LAB — HEMOGLOBIN A1C
Hgb A1c MFr Bld: 6.4 % — ABNORMAL HIGH (ref <5.7)
Mean Plasma Glucose: 137 mg/dL — ABNORMAL HIGH (ref <117)

## 2012-05-12 MED ORDER — LEVOTHYROXINE SODIUM 50 MCG PO TABS: 50.0000 ug | ORAL_TABLET | Freq: Every day | ORAL | Status: DC

## 2012-05-12 MED ORDER — SIMVASTATIN 40 MG PO TABS: 40.0000 mg | ORAL_TABLET | Freq: Every day | ORAL | Status: DC

## 2012-05-12 MED ORDER — ASPIRIN 325 MG PO TABS: 325.0000 mg | ORAL_TABLET | Freq: Every day | ORAL | Status: DC

## 2012-05-12 MED ORDER — MECLIZINE HCL 25 MG PO TABS: 25.0000 mg | ORAL_TABLET | Freq: Three times a day (TID) | ORAL | Status: AC | PRN

## 2012-05-12 NOTE — Progress Notes (Signed)
  Echocardiogram 2D Echocardiogram has been performed.  Kenidee Cregan FRANCES 05/12/2012, 4:49 PM

## 2012-05-12 NOTE — Evaluation (Signed)
Clinical/Bedside Swallow Evaluation Patient Details  Name: Dejia Ebron MRN: [id number] Date of Birth: 09-Oct-1951  Today's Date: 05/12/2012 Time: 4098-1191 SLP Time Calculation (min): 15 min  Past Medical History:  Past Medical History  Diagnosis Date  . BREAST CANCER 04/2001    Qualifier: Diagnosis of  By: Knox Royalty    . 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 Thyroid disease    Past Surgical History:  Past Surgical History  Procedure Date  . Breast lumpectomy 04/2001    excision of L breast fibroadenoma by Dr.   . Breast lumpectomy    HPI:  61 y/o female presented to ED with acute onset of dizziness, nausea and vomiting. Patient presented to [facility] but transferred to [facility] for neurology consult and management.  MRI indicates acute infarction left side of pons.  Patient failed RN stroke swallow screen and referred for BSE per stroke protocol.    Assessment / Plan / Recommendation Clinical Impression  Oropharyngeal swallow functional for regular consistency and thin liquids.  No outward clincial s/s of aspiration noted throughout evaluation.  Area of stroke increases concern for dysphagia however initiation and laryngeal elevation functional. No further Speech Language Pathology indicated in acute care setting or  or next level of care . ST to sign off.    Aspiration Risk  None    Diet Recommendation Regular;Thin liquid   Liquid Administration via: Cup;Straw Medication Administration: Whole meds with liquid Supervision: Patient able to self feed    Other  Recommendations Oral Care Recommendations: Oral care BID   Follow Up Recommendations  None             Swallow Study Prior Functional Status  No prior history of dysphagia     General Date of Onset: 05/11/12 HPI: 61 y/o female presented to ED with acute onset of dizziness, nausea and vomiting. Patient presented to [facility] but transferred to [facility] for neurology consult and management.  MRI indicates acute infarction left side  of pons.  Patient failed RN stroke swallow screen and referred for BSE per stroke protocol Type of Study: Bedside swallow evaluation Previous Swallow Assessment: no prior reports of dysphagia  Diet Prior to this Study: NPO Temperature Spikes Noted: No Respiratory Status: Room air History of Recent Intubation: No Behavior/Cognition: Alert;Cooperative;Pleasant mood Oral Cavity - Dentition: Adequate natural dentition Self-Feeding Abilities: Able to feed self Patient Positioning: Upright in bed Baseline Vocal Quality: Clear Volitional Cough: Strong Volitional Swallow: Able to elicit    Oral/Motor/Sensory Function Overall Oral Motor/Sensory Function: Appears within functional limits for tasks assessed   Ice Chips Ice chips: Within functional limits   Thin Liquid Thin Liquid: Within functional limits Presentation: Cup    Nectar Thick Nectar Thick Liquid: Not tested   Honey Thick Honey Thick Liquid: Not tested   Puree Puree: Within functional limits Presentation: Self Fed   Solid Solid: Within functional limits Presentation: Self Lorretta Harp MS, CCC-SLP [id number] [facility] 05/12/2012,10:23 AM

## 2012-05-12 NOTE — Progress Notes (Signed)
Patient refused Q2 neuro and vital sign checks. Patient did not want to be interrupted throughout the night. The patient did allow Q4 vital/neuro checks.

## 2012-05-12 NOTE — Evaluation (Signed)
Physical Therapy Evaluation Patient Details Name: Debbie Larsen MRN: [id number] DOB: April 14, 1951 Today's Date: 05/12/2012 Time: 4098-1191 PT Time Calculation (min): 39 min  PT Assessment / Plan / Recommendation Clinical Impression  Pt admitted with symptoms of dizziness and falls with small CVA left pons. Pt performed visual tracking, saccades, mobility, and return to supine grossly 8 min all without reproduction of symptoms or difficulty. Pt educated for vestibular system and function due to physician telling pt she may have a vestibular issue. Pt very concerned over vestibular system and extensively educated. Pt currently without any symptoms of peripheral or central vestibular dysfunction and encouraged to use gaze stabilization should symptoms reoccur. At this time pt at baseline without further needs for therapy. Pt educated that if symptoms arise physician referral for OPPT (vestibular) may be beneficial.     PT Assessment  Patent does not need any further PT services    Follow Up Recommendations  Outpatient PT (only if symptoms reoccur, currently not present)    Barriers to Discharge        Equipment Recommendations  None recommended by PT    Recommendations for Other Services     Frequency      Precautions / Restrictions Precautions Precautions: None   Pertinent Vitals/Pain No pain     Mobility  Bed Mobility Bed Mobility: Rolling Right;Rolling Left;Supine to Sit;Sit to Supine Rolling Right: 7: Independent Rolling Left: 7: Independent Supine to Sit: 7: Independent;HOB flat Sit to Supine: 7: Independent;HOB flat Transfers Transfers: Sit to Stand;Stand to Sit Sit to Stand: 7: Independent Stand to Sit: 7: Independent Ambulation/Gait Ambulation/Gait Assistance: 7: Independent Ambulation Distance (Feet): 600 Feet Assistive device: None Gait Pattern: Within Functional Limits Stairs: Yes Stairs Assistance: 7: Independent Stair Management Technique: No rails Number  of Stairs: 3  Modified Rankin (Stroke Patients Only) Pre-Morbid Rankin Score: No symptoms Modified Rankin: No symptoms    Exercises     PT Diagnosis:    PT Problem List:   PT Treatment Interventions:     PT Goals    Visit Information  Last PT Received On: 05/12/12 Assistance Needed: +1    Subjective Data  Subjective: I've been getting dizzy when lying down Patient Stated Goal: to not feel dizzy   Prior Functioning  Home Living Lives With: Alone Type of Home: House Home Access: Stairs to enter Secretary/administrator of Steps: 3 Entrance Stairs-Rails: None Home Layout: One level Bathroom Shower/Tub: Engineer, manufacturing systems: Standard Home Adaptive Equipment: None Prior Function Level of Independence: Independent Able to Take Stairs?: Yes Driving: Yes Vocation: Unemployed Communication Communication: No difficulties    Cognition  Overall Cognitive Status: Appears within functional limits for tasks assessed/performed Arousal/Alertness: Awake/alert Orientation Level: Appears intact for tasks assessed Behavior During Session: [facility] for tasks performed    Extremity/Trunk Assessment Right Upper Extremity Assessment RUE ROM/Strength/Tone: Within functional levels Left Upper Extremity Assessment LUE ROM/Strength/Tone: Within functional levels Right Lower Extremity Assessment RLE ROM/Strength/Tone: Within functional levels Left Lower Extremity Assessment LLE ROM/Strength/Tone: Within functional levels   Balance Balance Balance Assessed: Yes Static Sitting Balance Static Sitting - Balance Support: No upper extremity supported;Feet supported;Feet unsupported Static Sitting - Level of Assistance: 7: Independent Static Sitting - Comment/# of Minutes: 5 Static Standing Balance Static Standing - Balance Support: No upper extremity supported;During functional activity Static Standing - Level of Assistance: 7: Independent Static Standing - Comment/# of Minutes: 3  min High Level Balance High Level Balance Activites: Direction changes;Turns;Head turns High Level Balance Comments: pt able  to stand, bend over and put on shoes while standing. No difficulty with head turns in all directions, direction changes and turns  End of Session PT - End of Session Equipment Utilized During Treatment: Gait belt Activity Tolerance: Patient tolerated treatment well Patient left: in bed;with call bell/phone within reach Nurse Communication: Mobility status  GP     Delorse Lek 05/12/2012, 2:07 PM  Delaney Meigs, PT [phone]

## 2012-05-12 NOTE — Progress Notes (Signed)
RN notified of abnormal BP

## 2012-05-12 NOTE — H&P (Signed)
Family Medicine Teaching Service Attending Note  I interviewed and examined patient Debbie Larsen and reviewed their tests and x-rays.  I discussed with Dr. Elwyn Reach and reviewed their note for today.  I agree with their assessment and plan.     Additionally  This AM feels completely normal - no vertigo or lightheadness or visual changes or any deficits Alert oriented Normal neuro exam including romberg, finger to nose line walking etc Heart - Regular rate and rhythm.  No murmurs, gallops or rubs.     Confusing case - clinically textbook labyrinthitis or BPPV.   Very unusual for CVA. Regardless do TIA work up and start asprin I will try to review MRI with neuroradiology  Continue meclizine since this seems to give symptom relief  Follow up next week

## 2012-05-12 NOTE — Evaluation (Signed)
Speech Language Pathology Evaluation Patient Details Name: Debbie Larsen MRN: [id number] DOB: December 09, 1950 Today's Date: 05/12/2012 Time: 0945-1000 SLP Time Calculation (min): 15 min  Problem List:  Patient Active Problem List  Diagnosis  . BREAST CANCER  . HYPOTHYROIDISM, UNSPECIFIED  . GLUCOSE INTOLERANCE  . DENTAL CARIES  . INSOMNIA  . POSTMENOPAUSAL STATUS  . Radicular pain of shoulder  . Atypical chest pain  . Stroke   Past Medical History:  Past Medical History  Diagnosis Date  . BREAST CANCER 04/2001    Qualifier: Diagnosis of  By: Knox Royalty    . 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 Thyroid disease    Past Surgical History:  Past Surgical History  Procedure Date  . Breast lumpectomy 04/2001    excision of L breast fibroadenoma by Dr.   . Breast lumpectomy    HPI:  61 y/o female presented to [facility] ED with acute onset of dizziness,  nausea and vomiting. Patient transferred to [facility] for neurology consult and management. MRI indicates acurte infarction left side of pons. Patient referred for Cognitive Linguiistic evaluation  per stoke protocol.    Assessment / Plan / Recommendation Clinical Impression  Congitive Linguistic skills functional and judged to be baseline.  No further Speech Language Pathology indicated . ST to sign off.     SLP Assessment  Patient does not need any further Speech Lanaguage Pathology Services    Follow Up Recommendations  None         SLP Evaluation Prior Functioning  Cognitive/Linguistic Baseline: Within functional limits Lives With: Alone Vocation: Unemployed   Cognition  Overall Cognitive Status: Appears within functional limits for tasks assessed    Comprehension  Auditory Comprehension Overall Auditory Comprehension: Appears within functional limits for tasks assessed    Expression Expression Primary Mode of Expression: Verbal Verbal Expression Overall Verbal Expression: Appears within functional limits for tasks  assessed   Oral / Motor Oral Motor/Sensory Function Overall Oral Motor/Sensory Function: Appears within functional limits for tasks assessed Motor Speech Overall Motor Speech: Appears within functional limits for tasks assessed    Moreen Fowler MS, CCC-SLP [phone]  [facility] 05/12/2012, 10:39 AM

## 2012-05-12 NOTE — ED Provider Notes (Signed)
Medical screening examination/treatment/procedure(s) were conducted as a shared visit with non-physician practitioner(s) and myself.  I personally evaluated the patient during the encounter   Rolan Bucco, MD 05/12/12 [phone]

## 2012-05-12 NOTE — Progress Notes (Signed)
FMTS Daily Intern Progress Note  Subjective: Did well overnight.  No further vertigo, nausea.  Getting up to use the restroom with no trouble.  Did have episode of vertigo during exam this morning.   I have reviewed the patient's medications.  Objective Temp:  [97.6 F (36.4 C)-98.5 F (36.9 C)] 98.1 F (36.7 C) (07/13 0300) Pulse Rate:  [50-65] 50  (07/13 0300) Resp:  [16-20] 18  (07/13 0300) BP: (100-123)/(47-77) 100/47 mmHg (07/13 0300) SpO2:  [98 %-100 %] 100 % (07/13 0300) Weight:  [128 lb (58.06 kg)] 128 lb (58.06 kg) (07/13 0300)   Intake/Output Summary (Last 24 hours) at 05/12/12 0634 Last data filed at 05/11/12 1722  Gross per 24 hour  Intake   1000 ml  Output      0 ml  Net   1000 ml    CBG (last 3)  No results found for this basename: GLUCAP:3 in the last 72 hours  General: alert, cooperative, NAD, has lots of questions HEENT: AT/[place], sclera white, MMM, horizontal nystagmus seen when rolled to side CV: RRR, no murmurs Pulm: CTAB, no wheezes or rales Ext: no edema Neuro: alert, oriented, no focal deficits  Labs and Imaging  Lab 05/11/12 2018 05/11/12 1145 05/11/12 1102  WBC 6.6 6.4 --  HGB 12.5 12.2 12.2  HCT 37.4 36.5 36.0  PLT 206 238 --     Lab 05/11/12 2018 05/11/12 1102  NA -- 141  K -- 4.1  CL -- 108  CO2 -- --  BUN -- 7  CREATININE 0.75 0.70  LABGLOM -- --  GLUCOSE -- 128*  CALCIUM -- --   TSH: pending A1c: pending  Assessment and Plan Laiylah Prakriti Carignan is a 61 y.o. year old female with hypothyroidism presenting with acute stroke.   # Stroke: MRI showed small ischemic stroke in the left pons. Primary symptom was vertigo, which is improved with meclizine. Unclear etiology. Does have some risk factors with an A1c of 6.6 in 2010 (patient does not report diabetes) and an LDL of 124 in 02/2012. No prior strokes, no hypertension, not taking aspirin at home. 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  - f/u TSH, A1c, echo, carotids, and  MRA  - speech to evaluate in AM as failed bedside in [facility] - restart diet as able - PT/OT evaluation  - simvastatin 40mg  daily for secondary prevention  - full dose aspirin for secondary prevention  - will hold off on neurology consult while in hospital   # Vertigo: Likely related to acute stroke given location.  - meclizine 25mg  q8hr prn  - PT/OT for vestibular exercises (although these will likely be of limited use as etiology is likely central)   # Hypothyroidism: TSH was low at recent check in 02/2012. Dose was decreased at that time.  - f/u TSH   # Healthcare maintenance: With report of chest pain after exercise, may be a good candidate for an outpatient exercise stress test. Also needs outpatient colonoscopy. Patient asking lots of questions regarding malignancy being cause for stroke and concern that if her brain vessels are bad the vessels around her heart may also be bad. Discussed extensively, that these are things that will happen outpatient.   FEN/GI: NPO; NS at 100cc/hr  Prophylaxis: SQ lovenox  Disposition: pending PT/OT/speech and completion of studies  BOOTH, Noor Witte 05/12/2012, 8:30 AM    BOOTH, Trinette Vera Pager: [phone] 05/12/2012, 6:34 AM

## 2012-05-12 NOTE — Progress Notes (Signed)
Family Medicine Teaching Service Attending Note  I discussed patient Debbie Larsen  with Dr. Elwyn Reach and reviewed their note for today.  I agree with their assessment and plan.

## 2012-05-12 NOTE — Discharge Summary (Signed)
Discharge Summary 05/14/2012 8:09 AM  Debbie Larsen DOB: 1951-03-13 MRN: [id number]  Date of Admission: 05/11/2012 Date of Discharge: 05/12/2012  PCP: Delbert Harness, MD Consultants: none  Reason for Admission: stroke work up  Discharge Diagnosis Primary 1. Acute ischemic stroke 2. Vertigo 3. Hypothyroidism  Hospital Course: Debbie Larsen is a 61 y.o. year old female with hypothyroidism presenting with acute stroke.   1. Stroke: MRI showed small ischemic stroke in the left pons. Primary symptom was vertigo (peripheral vs central), which is improved with meclizine. Unclear etiology. Does have some risk factors with an A1c of 6.6 in 2010 (patient does not report diabetes) and an LDL of 124 in 02/2012. No prior strokes, no hypertension, not taking aspirin at home.  Risk stratification labs obtained as well as echo, carotids dopplers, and MRA.  PT/OT evaluated the patient prior to discharge and did not recommend follow up.  A bedside swallow was normal.  She was started on simvastatin 40mg  and aspirin 325mg  daily for secondary prophylaxis.    2. Vertigo: Likely related to acute stroke given location vs BPPV given positional component.  Started on meclizine with good results.  PT/OT were also consulted for positional exercises.  Recommended outpatient vestibular rehab if symptoms recur.  3. Hypothyroidism: TSH was low at recent check in 02/2012. Dose was decreased at that time. TSH rechecked this admission and low at 0.195.  Will decrease synthroid to on discharge.  4. Diabetes: A1c of 6.4.  Well controlled with diet alone.   Procedures: none  Discharge Medications Medication List  As of 05/14/2012  8:09 AM   START taking these medications         aspirin 325 MG tablet   Take 1 tablet (325 mg total) by mouth daily.      meclizine 25 MG tablet   Commonly known as: ANTIVERT   Take 1 tablet (25 mg total) by mouth 3 (three) times daily as needed for dizziness or nausea.     simvastatin 40 MG tablet   Commonly known as: ZOCOR   Take 1 tablet (40 mg total) by mouth daily at 6 PM.         CHANGE how you take these medications         levothyroxine 50 MCG tablet   Commonly known as: SYNTHROID, LEVOTHROID   Take 1 tablet (50 mcg total) by mouth daily. (dosage change)   What changed: - medication strength - dose         CONTINUE taking these medications         acetaminophen 500 MG tablet   Commonly known as: TYLENOL          Where to get your medications    These are the prescriptions that you need to pick up. We sent them to a specific pharmacy, so you will need to go there to get them.   [id number] - [place], [place] - [address]    [address]   [address]    Phone: [phone]        aspirin 325 MG tablet   levothyroxine 50 MCG tablet   meclizine 25 MG tablet   simvastatin 40 MG tablet            Pertinent Hospital Labs  Lab 05/11/12 2018 05/11/12 1145 05/11/12 1102  WBC 6.6 6.4 --  HGB 12.5 12.2 12.2  HCT 37.4 36.5 36.0  PLT 206 238 --    Lab 05/11/12 2018  05/11/12 1102  NA -- 141  K -- 4.1  CL -- 108  CO2 -- --  BUN -- 7  CREATININE 0.75 0.70  LABGLOM -- --  GLUCOSE -- 128*  CALCIUM -- --   TSH: 0.195 A1c: 6.4  MRI: 2 mm acute infarction affecting the left side of the pons.  Otherwise completely normal exam.  MRA: Slight focal narrowing distal right vertebral. No focal basilar  stenosis. Left vertebral dominant. No proximal flow reducing  lesion of the carotids.  Carotid Dopplers: No significant extracranial carotid artery stenosis demonstrated. Vertebrals are patent with antegrade flow.  Echo:  - Left ventricle: The cavity size was normal. Systolic function was normal. The estimated ejection fraction was in the range of 55% to 60%. Wall motion was normal; there were no regional wall motion abnormalities. - Pericardium, extracardiac: A trivial pericardial effusion was  identified.  Discharge instructions: see AVS  Condition at discharge: stable  Disposition: home  Pending Tests: carotid dopplers, echo  Follow up: Follow-up Information    Schedule an appointment as soon as possible for a visit with Delbert Harness, MD.   Contact information:   [address] [place] [address] [phone]          Follow up Issues:  1. Refer for colonoscopy - pt concerned about possible malignancy 2. Likely good candidate for exercise stress test given good function status and history suggestive of stable angina 3. Follow up vertigo 4. Follow up how she is tolerating the statin.  BOOTH, Jacque Garrels 05/14/2012, 8:09 AM

## 2012-05-12 NOTE — Progress Notes (Addendum)
*  PRELIMINARY RESULTS* Vascular Ultrasound Carotid Duplex (Doppler) has been completed.   No evidence of internal carotid artery stenosis bilaterally. Bilateral antegrade vertebral artery flow.  05/12/2012 9:21 AM Gertie Fey, RDMS, RDCS

## 2012-05-12 NOTE — Progress Notes (Signed)
RN notified of abnormal pulse and BP  

## 2012-05-12 NOTE — Progress Notes (Signed)
Patient discharged home via car by friend. This nurse escorted to car. Olevia Perches

## 2012-05-12 NOTE — Progress Notes (Signed)
OT Discharge Note  Patient is being discharged from OT services secondary to:    Baseline   Observed with PT Maija in hallway and spoke directly to the patient   No visual, sensory or balance deficits noted by PT or patient  Please see latest Therapy Progress Note for current level of functioning and progress toward goals.  Progress and discharge plan and discussed with patient/caregiver and they    Agree    Lucile Shutters   OTR/L Pager: [phone] Office: [phone] .

## 2012-05-14 NOTE — Discharge Summary (Signed)
I have reviewed this discharge summary and agree.    

## 2012-05-16 ENCOUNTER — Encounter: Payer: Self-pay | Admitting: Family Medicine

## 2012-05-16 ENCOUNTER — Ambulatory Visit (INDEPENDENT_AMBULATORY_CARE_PROVIDER_SITE_OTHER): Payer: Self-pay | Admitting: Family Medicine

## 2012-05-16 ENCOUNTER — Ambulatory Visit (INDEPENDENT_AMBULATORY_CARE_PROVIDER_SITE_OTHER): Payer: Self-pay | Admitting: Sports Medicine

## 2012-05-16 VITALS — BP 109/67 | HR 65 | Temp 98.6°F | Wt 129.0 lb

## 2012-05-16 VITALS — BP 97/67 | Ht 60.0 in | Wt 129.0 lb

## 2012-05-16 DIAGNOSIS — E739 Lactose intolerance, unspecified: Secondary | ICD-10-CM

## 2012-05-16 DIAGNOSIS — E039 Hypothyroidism, unspecified: Secondary | ICD-10-CM

## 2012-05-16 DIAGNOSIS — I69398 Other sequelae of cerebral infarction: Secondary | ICD-10-CM

## 2012-05-16 DIAGNOSIS — R42 Dizziness and giddiness: Secondary | ICD-10-CM | POA: Insufficient documentation

## 2012-05-16 DIAGNOSIS — I639 Cerebral infarction, unspecified: Secondary | ICD-10-CM

## 2012-05-16 DIAGNOSIS — I6350 Cerebral infarction due to unspecified occlusion or stenosis of unspecified cerebral artery: Secondary | ICD-10-CM

## 2012-05-16 DIAGNOSIS — M5412 Radiculopathy, cervical region: Secondary | ICD-10-CM

## 2012-05-16 MED ORDER — METFORMIN HCL 500 MG PO TABS: 500.0000 mg | ORAL_TABLET | Freq: Every day | ORAL | Status: DC

## 2012-05-16 MED ORDER — LOVASTATIN 20 MG PO TABS: 20.0000 mg | ORAL_TABLET | Freq: Every day | ORAL | Status: DC

## 2012-05-16 MED ORDER — TRAMADOL HCL 50 MG PO TABS: 50.0000 mg | ORAL_TABLET | Freq: Four times a day (QID) | ORAL | Status: AC | PRN

## 2012-05-16 NOTE — Progress Notes (Signed)
Subjective:     Patient ID: Debbie Larsen, female   DOB: Apr 07, 1951, 61 y.o.   MRN: [id number]  HPI 56 to F presents for hospital follow. She was admitted overnight between 05/11/12 and 05/12/12 for dizziness and found to hvae a small ischemic stroke n the L pons. She was discharged home on ASA 325 mg daily and simvistatin. She was given meclizine for the dizziness. Incidentally, her TSH was found to be low so her synthroid dose was decreased to 50 mcg daily.   1. Dizziness: improved with meclizine and laying elevated. Patient walks without difficulty and denies nausea. She takes meclizine once daily.   2. Lab review: elevated A1c in pre diabetes range. Patient interested in starting medication for this.   3. Hypothyroidism: patient denies weight gain, confusion and weakness except for R arm (chronic). She is taking the reduced dose of synthroid.   Past Medical History  Diagnosis Date  . BREAST CANCER 04/2001    Qualifier: Diagnosis of  By: Knox Royalty    . 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 Thyroid disease    Review of Systems As per HPI   Objective:   Physical Exam BP 109/67  Pulse 65  Temp 98.6 F (37 C) (Oral)  Wt 129 lb (58.514 kg) General appearance: alert, cooperative and no distress Head: Normocephalic, without obvious abnormality, atraumatic Eyes: conjunctivae/corneas clear. PERRL, EOM's intact. Fundi benign. Ears: normal TM's and external ear canals both ears Throat: lips, mucosa, and tongue normal; teeth and gums normal Neurologic: Mental status: Alert, oriented, thought content appropriate Cranial nerves: normal Motor: grossly normal Gait: Normal     Assessment and Plan:

## 2012-05-16 NOTE — Assessment & Plan Note (Signed)
A: improved. P: continue meclizine. Referral to vestibular rehab made.

## 2012-05-16 NOTE — Patient Instructions (Signed)
Debbie Larsen,  Thank you for coming in today. Start metformin 500 mg daily with breakfast. Remember it is common to have abdominal pain and nausea with metformin. If this intolerable I will switch it to something else.   Please f/u in 4 weeks for blood work.  My nurses will contact you when you can go to vestibular rehab.

## 2012-05-16 NOTE — Assessment & Plan Note (Signed)
A: no weakness or focal deficit. P: Continue ASA, changed to lovastatin from simvastatin due to cost.

## 2012-05-16 NOTE — Progress Notes (Signed)
Debbie Larsen is a 61 y.o. female who presents to [facility] today for right shoulder pain.  Patient has had pain in her right shoulder that radiates to her right arm for the last 4 months.  She has the pain is worse at night and is associated with loss of range of motion and weakness.  Additionally she notes numbness into her thumb.  She denies any hand weakness or loss of coordination.  Additionally she denies any neck pain.   She was seen at the family practice Center in May who recommended evaluation sports medicine Center.   She has a history of lumpectomy and x-ray therapy to the right side in 2003 and is concerned this may be lymphedema.   She denies any night sweats weight loss or injury.    PMH reviewed. As noted above right-sided lumpectomy and x-ray therapy for breast cancer 10 years ago History  Substance Use Topics  . Smoking status: Never Smoker   . Smokeless tobacco: Not on file  . Alcohol Use: No   ROS as above otherwise neg   Exam:  BP 97/67  Ht 5' (1.524 m)  Wt 129 lb (58.514 kg)  BMI 25.19 kg/m2 Gen: Well NAD MSK: Shoulder: Inspection reveals significant atrophy especially of the supraspinatus and infraspinatus and deltoid. Palpation is normal with no tenderness over AC joint or bicipital groove. ROM is limited to about 70 external rotation 90 abduction and on her degrees forward flexion Rotator cuff strength significantly diminished. SS 3/5 Infraspin 4/5 sub scap 5/5. Deltoid 4/5 No signs of impingement with negative Neer and Hawkin's tests, empty can sign. Speeds and Yergason's tests normal. No labral pathology noted with negative Obrien's, negative clunk and good stability. Normal scapular function observed. No painful arc and no drop arm sign. No apprehension sign   MSK [place]: Biceps Tendon: Intact in long and shot views in groove.  Sub Scap: Intact with small amount of calcification noted in long and short views.  Super Spin: Very atrophied.  Not  definitively noted on ultrasound.  Muscle belly  presents with significant atrophy. Infraspinatus: Noted in long and short views is intact. Significant atrophy of muscle belly A.c.: Intact with mild effusion

## 2012-05-16 NOTE — Assessment & Plan Note (Signed)
Patient has significant atrophy and weakness of the deltoid, supraspinatus, infraspinatus.   The etiology of this is could be neurogenic.  We suspect she may have a low motor neuron injury.   Plan for nerve conduction studies, with followup in a few weeks.  I will discuss the patient with a PM&R doctor to see if this is technically possible following lymph node dissection with risk for lymphedema.  We'll call the patient in a few days.  Followup with me in 3-4 weeks.  Pain management with tramadol as needed

## 2012-05-16 NOTE — Assessment & Plan Note (Signed)
A: A1c in prediabetes range. Lab Results  Component Value Date   HGBA1C 6.4* 05/12/2012  P: metformin 500 mg q AC breakfast.

## 2012-05-16 NOTE — Assessment & Plan Note (Signed)
A: tolerating decreased med dose.  P: check TFTs in one month.

## 2012-05-16 NOTE — Patient Instructions (Addendum)
Thank you for coming in today. Expect a call from me in a few days.  If you do not hear back call me within 1 week.  Please do the exercises we talked about.  Return in 3-4 weeks unless we arrange differently.  Please take tylenol or ibuprofen as needed for pain.   We think you have weakness and atropty of the shoulder muscles and we are trying to figure out why.

## 2012-05-17 ENCOUNTER — Inpatient Hospital Stay: Payer: Self-pay | Admitting: Family Medicine

## 2012-05-22 NOTE — Addendum Note (Signed)
Addended by: Annita Brod on: 05/22/2012 03:21 PM   Modules accepted: Orders

## 2012-05-23 ENCOUNTER — Telehealth: Payer: Self-pay | Admitting: Family Medicine

## 2012-05-23 NOTE — Telephone Encounter (Signed)
Called pt back today regarding her nerve conduction study. Info faxed to Dr. Jodean Lima office today. Asked pt to call me back if she has not heard anything within 1 week.

## 2012-05-28 ENCOUNTER — Encounter: Payer: Self-pay | Admitting: Physical Medicine & Rehabilitation

## 2012-05-30 ENCOUNTER — Other Ambulatory Visit: Payer: Self-pay | Admitting: *Deleted

## 2012-05-30 DIAGNOSIS — M25519 Pain in unspecified shoulder: Secondary | ICD-10-CM

## 2012-06-04 ENCOUNTER — Ambulatory Visit: Payer: Self-pay | Attending: Sports Medicine | Admitting: Physical Therapy

## 2012-06-04 DIAGNOSIS — R42 Dizziness and giddiness: Secondary | ICD-10-CM | POA: Insufficient documentation

## 2012-06-04 DIAGNOSIS — R269 Unspecified abnormalities of gait and mobility: Secondary | ICD-10-CM | POA: Insufficient documentation

## 2012-06-04 DIAGNOSIS — IMO0001 Reserved for inherently not codable concepts without codable children: Secondary | ICD-10-CM | POA: Insufficient documentation

## 2012-06-05 ENCOUNTER — Encounter: Payer: Self-pay | Admitting: Physical Therapy

## 2012-06-07 ENCOUNTER — Ambulatory Visit: Payer: Self-pay | Admitting: Physical Therapy

## 2012-06-11 ENCOUNTER — Ambulatory Visit: Payer: Self-pay | Admitting: Physical Therapy

## 2012-06-13 ENCOUNTER — Ambulatory Visit: Payer: Self-pay

## 2012-06-14 ENCOUNTER — Ambulatory Visit: Payer: Self-pay | Admitting: Physical Therapy

## 2012-06-18 ENCOUNTER — Ambulatory Visit: Payer: Self-pay

## 2012-06-19 ENCOUNTER — Ambulatory Visit: Payer: Self-pay | Admitting: Physical Therapy

## 2012-06-20 ENCOUNTER — Telehealth: Payer: Self-pay | Admitting: Family Medicine

## 2012-06-20 DIAGNOSIS — I69398 Other sequelae of cerebral infarction: Secondary | ICD-10-CM

## 2012-06-20 NOTE — Telephone Encounter (Signed)
Spoke with pt.  Her instructions say that nurses will call for her appt.  There is no referral in the chart will send back to Dr. Armen Pickup.  Pt informed [name], [name]

## 2012-06-20 NOTE — Telephone Encounter (Signed)
Vestibular rehab order placed.

## 2012-06-20 NOTE — Addendum Note (Signed)
Addended by: [name] on: 06/20/2012 04:27 PM   Modules accepted: Orders

## 2012-06-20 NOTE — Telephone Encounter (Signed)
Is asking to speak to nurse about vestibular rehab for her ear

## 2012-06-21 ENCOUNTER — Ambulatory Visit: Payer: Self-pay

## 2012-06-25 ENCOUNTER — Ambulatory Visit: Payer: Self-pay | Admitting: Physical Therapy

## 2012-06-26 ENCOUNTER — Ambulatory Visit (INDEPENDENT_AMBULATORY_CARE_PROVIDER_SITE_OTHER): Payer: Self-pay | Admitting: Family Medicine

## 2012-06-26 ENCOUNTER — Ambulatory Visit (HOSPITAL_COMMUNITY)
Admission: RE | Admit: 2012-06-26 | Discharge: 2012-06-26 | Disposition: A | Discharge status: Home / Self Care | Payer: Self-pay | Source: Ambulatory Visit | Attending: Family Medicine | Admitting: Family Medicine

## 2012-06-26 ENCOUNTER — Encounter: Payer: Self-pay | Admitting: Family Medicine

## 2012-06-26 VITALS — BP 107/79 | HR 84 | Ht 60.0 in | Wt 130.0 lb

## 2012-06-26 DIAGNOSIS — G629 Polyneuropathy, unspecified: Secondary | ICD-10-CM

## 2012-06-26 DIAGNOSIS — Z780 Asymptomatic menopausal state: Secondary | ICD-10-CM

## 2012-06-26 DIAGNOSIS — C50919 Malignant neoplasm of unspecified site of unspecified female breast: Secondary | ICD-10-CM

## 2012-06-26 DIAGNOSIS — R7309 Other abnormal glucose: Secondary | ICD-10-CM

## 2012-06-26 DIAGNOSIS — R7303 Prediabetes: Secondary | ICD-10-CM

## 2012-06-26 DIAGNOSIS — M25519 Pain in unspecified shoulder: Secondary | ICD-10-CM | POA: Insufficient documentation

## 2012-06-26 DIAGNOSIS — M25511 Pain in right shoulder: Secondary | ICD-10-CM

## 2012-06-26 DIAGNOSIS — G589 Mononeuropathy, unspecified: Secondary | ICD-10-CM

## 2012-06-26 DIAGNOSIS — E039 Hypothyroidism, unspecified: Secondary | ICD-10-CM

## 2012-06-26 DIAGNOSIS — E559 Vitamin D deficiency, unspecified: Secondary | ICD-10-CM

## 2012-06-26 NOTE — Patient Instructions (Signed)
Debbie Larsen,  Thank you for coming in today. I will call or send a letter with blood work results.   1. Bone density scan: at the breast imaging center. 2. R shoulder x-ray at [facility]. 3. Physical medicine referral for pain management to [facility] neurology. 4. The orange card will not cover colonoscopy. I recommend the stool cards.   Please f/u in 2 months.

## 2012-06-26 NOTE — Progress Notes (Signed)
Subjective:     Patient ID: Debbie Larsen, female   DOB: 1951/07/03, 61 y.o.   MRN: [id number]  HPI 61 yo F presents for f/u to discuss the following:  1. Hypothyroidism: compliant with synthroid. Denies hot/cold flashes, chest pain, palpitation, diarrhea, constipation, weight loss or gain.   2. R shoulder pain: persistent. Seen at [facility]. Sent to PT and referred to neurology for nerve conduction study given evidence of supraspinitus atrophy. Reports persistent pain from shoulder to hand. Denies injury. Request x-ray and referral to orthopedics. Taking tylenol and motrin for pain. 6-8/10 to 9-10/10.   3. L ear ringing: patient with ringing in L ear and feels faint and lying on L side. Admits to dizziness mostly while in bed but sometimes during the day. She is awaiting vestibular rehab. She is no longer taking meclizine. She is interested in ENT referral.   4. Prediabetes: stopped metformin due to GI upset. Interested in taking chromium instead, she saw this on doctor Oz. She denies polyuria, polydipsia, sweats and nausea. She eats a well rounded diet. She does not exercise.   4. Health maintenance: due for bone density scan.   Review of Systems As per HPI     Objective:   Physical Exam BP 107/79  Pulse 84  Ht 5' (1.524 m)  Wt 130 lb (58.968 kg)  BMI 25.39 kg/m2 General appearance: alert, cooperative and no distress Ears: normal TM's and external ear canals both ears Neck: no adenopathy, no carotid bruit, no JVD, supple, symmetrical, trachea midline and thyroid not enlarged, symmetric, no tenderness/mass/nodules Lungs: clear to auscultation bilaterally Heart: regular rate and rhythm, S1, S2 normal, no murmur, click, rub or gallop Extremities: extremities normal, atraumatic, no cyanosis or edema, marked atrophy of R supraspinatus.     Assessment and Plan:

## 2012-06-27 ENCOUNTER — Ambulatory Visit: Payer: Self-pay | Admitting: Physical Therapy

## 2012-06-27 LAB — TSH: TSH: 8.145 u[IU]/mL — ABNORMAL HIGH (ref 0.350–4.500)

## 2012-06-27 LAB — VITAMIN B12: Vitamin B-12: 1033 pg/mL — ABNORMAL HIGH (ref 211–911)

## 2012-06-28 ENCOUNTER — Telehealth: Payer: Self-pay | Admitting: Family Medicine

## 2012-06-28 ENCOUNTER — Encounter: Payer: Self-pay | Admitting: Family Medicine

## 2012-06-28 ENCOUNTER — Ambulatory Visit: Payer: Self-pay | Admitting: Physical Therapy

## 2012-06-28 DIAGNOSIS — E559 Vitamin D deficiency, unspecified: Secondary | ICD-10-CM | POA: Insufficient documentation

## 2012-06-28 MED ORDER — LEVOTHYROXINE SODIUM 75 MCG PO TABS: 75.0000 ug | ORAL_TABLET | Freq: Every day | ORAL | Status: DC

## 2012-06-28 MED ORDER — VITAMIN D 50 MCG (2000 UT) PO TABS: 2000.0000 [IU] | ORAL_TABLET | Freq: Every day | ORAL | Status: DC

## 2012-06-28 NOTE — Assessment & Plan Note (Signed)
A: elevated TSH.  P:  increase synthroid to 75 mcg daily.  Repeat TSH in 6 weeks.

## 2012-06-28 NOTE — Addendum Note (Signed)
Addended by: [name] on: 06/28/2012 03:17 PM   Modules accepted: Orders

## 2012-06-28 NOTE — Telephone Encounter (Signed)
Spoke with patient, she is unable to go to [facility] for pain management due to her having the orange card. [facility] Dr. Armen Pickup her referral is not popping up in the workqueue could you please change it to Pain Management instead of physical medicine. Thanks!

## 2012-06-28 NOTE — Telephone Encounter (Signed)
Put in referral to pain clinic. I could not find pain management.

## 2012-06-28 NOTE — Assessment & Plan Note (Addendum)
A: asymptomatic. Not taking metformin.  P: Discontinue metformin Read about chromium: may above blood sugar control in patient with diabetes but not those with normal glucose tolerance. Recommend dietary intake is 20-35 mcg/day.  Best course of management diet and exercise with weight loss. Goal weight: Weight loss plan:

## 2012-06-28 NOTE — Telephone Encounter (Signed)
Pt is asking to speak with nurse about her pain meds - Dr Armen Pickup had spoken with her yesterday and she was told to call the nurse

## 2012-06-28 NOTE — Assessment & Plan Note (Signed)
Replete with OTC vitamin D. 2000 Units daily. Expect 20 mg/dL increase in vitamin D level. Recheck vitamin D level in 12 weeks.

## 2012-06-28 NOTE — Assessment & Plan Note (Addendum)
A: persistent pain. Patient not taking tramadol. R shoulder x-ray significant for mild degenerative process at humeral head.  P: Tylenol for pain daily.  Motrin prn or severe pain.  F/u nerve conduction study.  Referral to PMR.

## 2012-06-29 ENCOUNTER — Ambulatory Visit: Payer: Self-pay | Admitting: Rehabilitative and Restorative Service Providers"

## 2012-07-01 DIAGNOSIS — R94131 Abnormal electromyogram [EMG]: Secondary | ICD-10-CM

## 2012-07-01 HISTORY — DX: Abnormal electromyogram (EMG): R94.131

## 2012-07-03 ENCOUNTER — Other Ambulatory Visit: Payer: Self-pay | Admitting: Family Medicine

## 2012-07-03 ENCOUNTER — Ambulatory Visit: Payer: Self-pay | Attending: Family Medicine | Admitting: Physical Therapy

## 2012-07-03 DIAGNOSIS — Z1231 Encounter for screening mammogram for malignant neoplasm of breast: Secondary | ICD-10-CM

## 2012-07-03 DIAGNOSIS — IMO0001 Reserved for inherently not codable concepts without codable children: Secondary | ICD-10-CM | POA: Insufficient documentation

## 2012-07-03 DIAGNOSIS — R269 Unspecified abnormalities of gait and mobility: Secondary | ICD-10-CM | POA: Insufficient documentation

## 2012-07-03 DIAGNOSIS — R42 Dizziness and giddiness: Secondary | ICD-10-CM | POA: Insufficient documentation

## 2012-07-04 ENCOUNTER — Ambulatory Visit: Payer: Self-pay | Admitting: Physical Therapy

## 2012-07-05 ENCOUNTER — Ambulatory Visit: Payer: Self-pay | Admitting: Physical Therapy

## 2012-07-05 ENCOUNTER — Ambulatory Visit (HOSPITAL_COMMUNITY): Payer: Self-pay

## 2012-07-06 ENCOUNTER — Telehealth: Payer: Self-pay | Admitting: *Deleted

## 2012-07-06 NOTE — Telephone Encounter (Signed)
Pt called today requesting a referral to pain management to be seen for her continued rt shoulder radicular pain with arm and hand numbness and weakness.  She said she is interested in the alternative tx options they offer there- ie acupuncture.  She is scheduled for an EMG there 07/23/12.  Advised pt I would check with Dr. Margaretha Sheffield and let her know on Monday.

## 2012-07-09 ENCOUNTER — Ambulatory Visit: Payer: Self-pay | Admitting: Physical Therapy

## 2012-07-09 NOTE — Telephone Encounter (Signed)
Per Dr. Margaretha Sheffield- advised pt she can come in here for shoulder inj, or gabapentin can be called in for her if she is still having significant pain.  Advised her that referral to PM&R is not appropriate at this time because her pain is still being evaluated.  She is scheduled to have EMG in 2 weeks.  Pt says she will call back to schedule appt with Dr. Denyse Amass to discuss tx options.

## 2012-07-11 ENCOUNTER — Encounter: Payer: Self-pay | Admitting: Rehabilitative and Restorative Service Providers"

## 2012-07-11 ENCOUNTER — Encounter: Payer: Self-pay | Admitting: Physical Therapy

## 2012-07-12 ENCOUNTER — Encounter: Payer: Self-pay | Admitting: Physical Therapy

## 2012-07-12 ENCOUNTER — Encounter: Payer: Self-pay | Admitting: Family Medicine

## 2012-07-13 ENCOUNTER — Ambulatory Visit (INDEPENDENT_AMBULATORY_CARE_PROVIDER_SITE_OTHER): Payer: Self-pay | Admitting: Family Medicine

## 2012-07-13 ENCOUNTER — Encounter: Payer: Self-pay | Admitting: Family Medicine

## 2012-07-13 VITALS — BP 124/76

## 2012-07-13 DIAGNOSIS — R42 Dizziness and giddiness: Secondary | ICD-10-CM

## 2012-07-13 DIAGNOSIS — I69998 Other sequelae following unspecified cerebrovascular disease: Secondary | ICD-10-CM

## 2012-07-13 DIAGNOSIS — R0789 Other chest pain: Secondary | ICD-10-CM

## 2012-07-13 DIAGNOSIS — I69398 Other sequelae of cerebral infarction: Secondary | ICD-10-CM

## 2012-07-13 MED ORDER — LORATADINE 10 MG PO TABS: 10.0000 mg | ORAL_TABLET | Freq: Every day | ORAL | Status: DC

## 2012-07-13 NOTE — Patient Instructions (Signed)
I would like you to start taking loratadine for your ear pressure.  Take it once per day for the next several weeks.  If this is not helping, come back to see [place]. I want you to have your blood pressure checked every couple of days for the next several weeks.  Keep a log of your readings and bring them in to your next appointment. We will try to get things set up for you to see a cardiologist for a stress test.  This may take a couple of weeks.

## 2012-07-13 NOTE — Progress Notes (Signed)
Patient ID: Debbie Larsen, female   DOB: [birth year], 61 y.o.   MRN: [id number] Subjective: The patient is a 61 y.o. year old female who presents today for blood pressure issues and left ear pain.  Beginning yesterday started having problems with feeling lightheaded/dizzy.  She called her nurse friend who checked blood pressure which was ~150 systolic.  Had recheck this AM and was again 150.  Has continued to feel dizzy.  Has intermittent chest discomfort.  Has previously been evaluated for this.  Blood work was relatively unremarkable.  Chest pain is non-exertional, central, and with no radiation.  No SOB.  Notices most in evening when she lies down.  Patient's past medical, social, and family history were reviewed and updated as appropriate. History  Substance Use Topics  . Smoking status: Never Smoker   . Smokeless tobacco: Not on file  . Alcohol Use: No   Objective:  Filed Vitals:   07/13/12 1225  BP: 124/76   Gen: NAD HEENT: TM normal b/l CV: RRR, faint 2/6 systolic murmur heard RUSB, 2+ pulses Chest: Discomfort reproducible with palpation over sternum Resp: CTABl Ext: No edema  Assessment/Plan: Blood pressure, on multiple checks, is normal.  Have encouraged patient to keep log and come back to clinic with friend's blood pressure monitor to compare to ours.  Please also see individual problems in problem list for problem-specific plans.

## 2012-07-13 NOTE — Assessment & Plan Note (Signed)
Patient continues to complain of intermittent vertigo and ear "pressure" with normal exam.  Will try treatment with loratadine for possible allergic component and would likely then try flonase prior to a referral to ENT, which is what patient wants.  I also don't think that a referral to neurology, as requested by patient, is indicated at this time.

## 2012-07-13 NOTE — Assessment & Plan Note (Signed)
Agree with prior assessment that this is atypical chest pain, likely not cardiac.  Considering age, h/o breast cancer with radiation, and hypothyroid will try to arrange for exercise stress.  (EKG from 2 months ago shows nothing that would preclude and patient can walk)  Otherwise no specific therapy indicated.

## 2012-07-16 ENCOUNTER — Encounter: Payer: Self-pay | Admitting: Physical Therapy

## 2012-07-18 ENCOUNTER — Encounter: Payer: Self-pay | Admitting: Physical Therapy

## 2012-07-19 ENCOUNTER — Encounter: Payer: Self-pay | Admitting: Physical Therapy

## 2012-07-20 ENCOUNTER — Encounter: Payer: Self-pay | Admitting: Rehabilitative and Restorative Service Providers"

## 2012-07-23 ENCOUNTER — Encounter: Payer: Self-pay | Admitting: Physical Medicine & Rehabilitation

## 2012-07-23 ENCOUNTER — Ambulatory Visit: Payer: Self-pay | Admitting: Physical Medicine & Rehabilitation

## 2012-07-23 ENCOUNTER — Encounter: Payer: Medicaid Other | Attending: Physical Medicine & Rehabilitation

## 2012-07-23 VITALS — BP 117/74 | HR 88 | Resp 14 | Wt 127.2 lb

## 2012-07-23 DIAGNOSIS — G5690 Unspecified mononeuropathy of unspecified upper limb: Secondary | ICD-10-CM

## 2012-07-23 DIAGNOSIS — G540 Brachial plexus disorders: Secondary | ICD-10-CM

## 2012-07-23 NOTE — Progress Notes (Signed)
  Subjective:    Patient ID: Debbie Larsen, female    DOB: May 30, 1951, 61 y.o.   MRN: [id number]  HPI    Review of Systems     Objective:   Physical Exam        Assessment & Plan:  EMG performed 07/23/2012.  See EMG report under media tab.

## 2012-07-24 ENCOUNTER — Ambulatory Visit
Admission: RE | Admit: 2012-07-24 | Discharge: 2012-07-24 | Disposition: A | Discharge status: Home / Self Care | Payer: Self-pay | Source: Ambulatory Visit | Attending: Family Medicine | Admitting: Family Medicine

## 2012-07-24 DIAGNOSIS — Z1231 Encounter for screening mammogram for malignant neoplasm of breast: Secondary | ICD-10-CM

## 2012-07-24 DIAGNOSIS — Z780 Asymptomatic menopausal state: Secondary | ICD-10-CM

## 2012-07-26 ENCOUNTER — Other Ambulatory Visit: Payer: Self-pay | Admitting: Family Medicine

## 2012-07-26 ENCOUNTER — Telehealth: Payer: Self-pay | Admitting: Family Medicine

## 2012-07-26 ENCOUNTER — Encounter: Payer: Self-pay | Admitting: Physical Medicine & Rehabilitation

## 2012-07-26 DIAGNOSIS — R928 Other abnormal and inconclusive findings on diagnostic imaging of breast: Secondary | ICD-10-CM

## 2012-07-26 DIAGNOSIS — G5690 Unspecified mononeuropathy of unspecified upper limb: Secondary | ICD-10-CM

## 2012-07-26 MED ORDER — GABAPENTIN 300 MG PO CAPS: 300.0000 mg | ORAL_CAPSULE | Freq: Every day | ORAL | Status: DC

## 2012-07-26 NOTE — Telephone Encounter (Signed)
Call patient about her EMG report.   Discussed with Dr. Margaretha Sheffield and musculoskeletal radiologist. Plan for brachial plexus MRI. Will treat neuropathic symptoms with gabapentin at night and followup after the MRI.

## 2012-07-30 ENCOUNTER — Other Ambulatory Visit: Payer: Self-pay | Admitting: Family Medicine

## 2012-07-30 DIAGNOSIS — R928 Other abnormal and inconclusive findings on diagnostic imaging of breast: Secondary | ICD-10-CM

## 2012-07-31 ENCOUNTER — Other Ambulatory Visit: Payer: Self-pay

## 2012-08-03 ENCOUNTER — Institutional Professional Consult (permissible substitution): Payer: Self-pay | Admitting: Cardiovascular Disease

## 2012-08-03 ENCOUNTER — Encounter: Payer: Self-pay | Admitting: Family Medicine

## 2012-08-03 DIAGNOSIS — M8580 Other specified disorders of bone density and structure, unspecified site: Secondary | ICD-10-CM | POA: Insufficient documentation

## 2012-08-07 ENCOUNTER — Ambulatory Visit
Admission: RE | Admit: 2012-08-07 | Discharge: 2012-08-07 | Disposition: A | Discharge status: Home / Self Care | Payer: Self-pay | Source: Ambulatory Visit | Attending: Family Medicine | Admitting: Family Medicine

## 2012-08-07 ENCOUNTER — Ambulatory Visit: Admission: RE | Admit: 2012-08-07 | Payer: Self-pay | Source: Ambulatory Visit

## 2012-08-07 ENCOUNTER — Ambulatory Visit: Payer: Self-pay | Admitting: *Deleted

## 2012-08-07 VITALS — BP 138/70 | HR 70

## 2012-08-07 DIAGNOSIS — R928 Other abnormal and inconclusive findings on diagnostic imaging of breast: Secondary | ICD-10-CM

## 2012-08-07 NOTE — Progress Notes (Signed)
Pt walked into the office at 4:05pm, she has a new pt appt on Thursday this week. She is concerned about the chest pain she has had off and on for a couple weeks now. It occurs usually in the morning or evening. This morning she had the discomfort while making coffee. She is also having trouble with her right shoulder and is schedule for an MRI soon. She denies chest discomfort at this time. She was just worried and thought dr Jens Som could see today, explained to pt dr Jens Som is working in the hosp today not in the office. Explained to pt we would need to do an EKG and if nothing was acute we would recommend transport to [facility] ER via EMS. Pt refused all care at this time and states she will come back on Thursday to be seen. AMA paperwork explained to the pt and she voiced understanding and signed.

## 2012-08-08 ENCOUNTER — Encounter: Payer: Self-pay | Admitting: Family Medicine

## 2012-08-08 ENCOUNTER — Telehealth: Payer: Self-pay | Admitting: Family Medicine

## 2012-08-08 ENCOUNTER — Telehealth: Payer: Self-pay | Admitting: *Deleted

## 2012-08-08 NOTE — Telephone Encounter (Signed)
Pt called requesting that rt breast MRI be added to her current MRI order of the chest so she will only have to go in for MRI once.  Discussed with Dr. Margaretha Sheffield- he advises that the physician coordinating her care re: breast CA issues should order the MRI of her breast.  He will discuss with Dr. Jennette Kettle on Friday re: proceeding with chest MRI.

## 2012-08-08 NOTE — Progress Notes (Signed)
Patient ID: Debbie Larsen, female   DOB: 1951/06/22, 61 y.o.   MRN: [id number] I received copies of her screening mammogram and diagnostic R mammogram today. I have never seen her in clinic. She has seen Dr Armen Pickup and I suppose that is how my name became attached to her mammogram report. She is also being seen by [facility] (Drs Margaretha Sheffield and Denyse Amass) for shoulder pain. Per the notes from nurse at breast center she is applying for  Assistance through the [facility] program before scheduling biopsy. I have made the [facility] aware of her liekly recurrence of breat cancer as it may impact her treatment and evaluation for her shoulder pain. I am routing this note to her PCP, Dr. Armen Pickup and will ask Dr Armen Pickup to call her and make sure that she is set up appropriately for further management of this new suspicious mass.

## 2012-08-08 NOTE — Telephone Encounter (Signed)
Dr Armen Pickup I received a mammogram report that is worrisome for breat cancer recurrence. Looks like teh breast center already has her in the system to get financial assistance and biopsy. I feel like we as her PCP office need to at least call and make sure she does not need anything (like a referral etc). I see you are listed as her PCP. Can you call her and touch base? THANKS! Denny Levy

## 2012-08-09 ENCOUNTER — Encounter: Payer: Self-pay | Admitting: Cardiology

## 2012-08-09 ENCOUNTER — Encounter: Payer: Self-pay | Admitting: Family Medicine

## 2012-08-09 ENCOUNTER — Telehealth: Payer: Self-pay | Admitting: Sports Medicine

## 2012-08-09 ENCOUNTER — Telehealth: Payer: Self-pay | Admitting: Family Medicine

## 2012-08-09 ENCOUNTER — Ambulatory Visit (INDEPENDENT_AMBULATORY_CARE_PROVIDER_SITE_OTHER): Payer: Self-pay | Admitting: Cardiology

## 2012-08-09 VITALS — BP 129/75 | HR 73 | Wt 130.0 lb

## 2012-08-09 DIAGNOSIS — E039 Hypothyroidism, unspecified: Secondary | ICD-10-CM

## 2012-08-09 DIAGNOSIS — R0789 Other chest pain: Secondary | ICD-10-CM

## 2012-08-09 MED ORDER — TRAMADOL HCL 50 MG PO TABS: 50.0000 mg | ORAL_TABLET | Freq: Three times a day (TID) | ORAL | Status: DC | PRN

## 2012-08-09 NOTE — Telephone Encounter (Signed)
1. Called patient. She has applied for BCCCP and is awaiting scheduled lymph node biopsy. She is asking for MRI of breast, chest and shoulder. I am happy to order this if recommended regarding work up. She has R shoulder pain x many months now, which may but related to lymphadenopathy but this is unclear.   2. I called the breast imaging center and Left VM asking if MRI is recommend as it was not mentioned in her report. I do know that oncologist often order MRI for complete staging. ? If I should wait on imaging until she is established with oncologist.   3. Called   IMPRESSION:  At least four right axillary masses with imaging features highly  suspicious for metastatic adenopathy. Ultrasound-guided core  needle biopsy of the largest mass is recommended. The patient  will apply for enrollment in the BCCCP program before scheduling  the biopsy.  RECOMMENDATION:  Right axillary ultrasound-guided core needle biopsy.  BI-RADS CATEGORY 5: Highly suggestive of malignancy - appropriate  action should be taken.

## 2012-08-09 NOTE — Assessment & Plan Note (Signed)
Management per primary care. 

## 2012-08-09 NOTE — Telephone Encounter (Signed)
3. Received call back from [facility] from the breast imaging center, regarding MRI. Patient is set up to register with [facility] program, she has an appt this month. Per Ms. McConnel it is in the patient's best interest to wait for insurance coverage and pathology results prior to imaging in order to make sure the test are covered. If I decide to go forth with imaging now I can call her to help schedule imaging at [facility] or [facility] at her direct #, [phone].

## 2012-08-09 NOTE — Progress Notes (Signed)
HPI: 61 year old female for evaluation of chest pain. Echocardiogram in July of 2013 revealed normal LV function with an ejection fraction of 55-60%. There was a trivial pericardial effusion. Patient states that for the past several months she has had occasional chest pain. It is in the left chest area and described as a pulsating pain. It lasts approximately 15 minutes and resolves spontaneously. It can radiate laterally. No associated symptoms. It improves with ambulating. Because of her chest pain we will asked to evaluate. She denies dyspnea on exertion, orthopnea, PND, pedal edema or syncope. Note she is scheduled to have potential right breast surgery.  Current Outpatient Prescriptions  Medication Sig Dispense Refill  . acetaminophen (TYLENOL) 500 MG tablet Take 500 mg by mouth every 4 (four) hours as needed. For pain.      Marland Kitchen aspirin 325 MG tablet Take 1 tablet (325 mg total) by mouth daily.  30 tablet  0  . cholecalciferol 2000 UNITS tablet Take 1 tablet (2,000 Units total) by mouth daily.      Marland Kitchen levothyroxine (SYNTHROID, LEVOTHROID) 75 MCG tablet Take 1 tablet (75 mcg total) by mouth daily. (dosage change)  30 tablet  1  . loratadine (CLARITIN) 10 MG tablet Take 1 tablet (10 mg total) by mouth daily.  30 tablet  11  . traMADol (ULTRAM) 50 MG tablet Take 1 tablet (50 mg total) by mouth every 8 (eight) hours as needed for pain.  30 tablet  1    No Known Allergies  Past Medical History  Diagnosis Date  . 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 Hypothyroid   . BREAST CANCER 04/2001    s/p radiation therapy, chemotherapy and lumpectomy    Past Surgical History  Procedure Date  . Breast lumpectomy 04/2001    excision of L breast fibroadenoma by Dr.   . Breast lumpectomy 05/16/2001    Right axillary sentinel lymph node biopsy.  . Tonsillectomy     History   Social History  . Marital Status: Divorced    Spouse Name: N/A    Number of Children: 1   . Years of Education: N/A    Occupational History  .     Social History Main Topics  . Smoking status: Never Smoker   . Smokeless tobacco: Not on file  . Alcohol Use: No  . Drug Use: No  . Sexually Active:    Other Topics Concern  . Not on file   Social History Narrative   Lives alone. Has help from friends in her neighborhood. Mom died of cardiac arrest, Aug 02, 2012. Brother lives in [place], [place].Sister lives in [place], [place].     Family History  Problem Relation Age of Onset  . Diabetes Mother   . Hypertension Father   . Hypertension Sister   . Hypothyroidism Brother   . Cancer Neg Hx     ROS: some pain in right breast and dizziness but no fevers or chills, productive cough, hemoptysis, dysphasia, odynophagia, melena, hematochezia, dysuria, hematuria, rash, seizure activity, orthopnea, PND, pedal edema, claudication. Remaining systems are negative.  Physical Exam:   Blood pressure 129/75, pulse 73, weight 130 lb (58.968 kg).  General:  Well developed/well nourished in NAD Skin warm/dry Patient not depressed No peripheral clubbing Back-normal HEENT-normal/normal eyelids Neck supple/normal carotid upstroke bilaterally; no bruits; no JVD; no thyromegaly chest - CTA/ normal expansion CV - RRR/normal S1 and S2; no murmurs, rubs or gallops;  PMI nondisplaced Abdomen -NT/ND, no HSM, no mass, + bowel sounds, no  bruit 2+ femoral pulses, no bruits Ext-no edema, chords, 2+ DP Neuro-grossly nonfocal  ECG sinus rhythm at a rate of 73. RV conduction delay. No ST changes.

## 2012-08-09 NOTE — Telephone Encounter (Signed)
I spoke with Debbie Larsen today on the phone. So recently had a mammogram done which is worrisome for returning metastatic breast cancer. She has at least 4 right axillary masses for which a core needle biopsy has been suggested. At the time of this dictation the patient's PCP has ordered a breast MRI to evaluate further. If in fact she has metastatic breast cancer and significant lymphadenopathy, this may in fact be the cause for her shoulder pain and abnormal findings on her recent EMG/NCS. At this point I have recommended that we cancel the MRI that we've ordered of her brachial plexus and instead I have recommended that she go ahead and continue with workup and treatment with her PCP in the breast Center for what appears to be recurring metastatic breast cancer. She will followup with me when necessary.

## 2012-08-09 NOTE — Assessment & Plan Note (Signed)
Patient symptoms are atypical. Electrocardiogram shows no ST changes. Plan stress echocardiogram for risk stratification particularly with upcoming surgery. If negative no plans for further ischemia evaluation and she would be cleared for surgery.

## 2012-08-09 NOTE — Patient Instructions (Signed)
Your physician recommends that you schedule a follow-up appointment in: AS NEEDED PENDING TEST RESULTS  Your physician has requested that you have a stress echocardiogram. For further information please visit [email or web address]. Please follow instruction sheet as given.    

## 2012-08-09 NOTE — Telephone Encounter (Signed)
Called patient.  Advised that she keep follow appt on 08/17/12 for BCCPS.  Will hold off on all imaging until pathology results as it is likely, that if her pathology is positive for recurrent breast cancer a PET scan will be obtained.   For  R shoulder pain in the meantime: 1. Gabapentin 300 mg q HS- patient has not picked up or taken yet. Advised she use prn  2. Tramadol q 8 hr prn.   Osteopenia: She received my letter. She does not want to take oral calcium. Concerned about calcinosis. She is drinking milk TID. Plan recheck bone density scan in 6-12 mos.   Patient informed me that her mother passed away 1 month from cardiac arrest. This was very sad news as I know that she was close to her mom even though she lies in [place]. Her brother is in [place]. He sister is in [place]. She has not told them of her possible cancer recurrence.

## 2012-08-17 ENCOUNTER — Ambulatory Visit (HOSPITAL_COMMUNITY)
Admission: RE | Admit: 2012-08-17 | Discharge: 2012-08-17 | Disposition: A | Discharge status: Home / Self Care | Payer: Medicaid Other | Source: Ambulatory Visit | Attending: Obstetrics and Gynecology | Admitting: Obstetrics and Gynecology

## 2012-08-17 ENCOUNTER — Other Ambulatory Visit: Payer: Self-pay | Admitting: Obstetrics and Gynecology

## 2012-08-17 ENCOUNTER — Encounter (HOSPITAL_COMMUNITY): Payer: Self-pay

## 2012-08-17 ENCOUNTER — Other Ambulatory Visit (HOSPITAL_COMMUNITY)
Admission: RE | Admit: 2012-08-17 | Discharge: 2012-08-17 | Disposition: A | Discharge status: Home / Self Care | Payer: Medicaid Other | Source: Ambulatory Visit | Attending: Obstetrics and Gynecology | Admitting: Obstetrics and Gynecology

## 2012-08-17 VITALS — BP 100/62 | Temp 97.6°F | Ht 60.0 in | Wt 127.2 lb

## 2012-08-17 DIAGNOSIS — Z01419 Encounter for gynecological examination (general) (routine) without abnormal findings: Secondary | ICD-10-CM

## 2012-08-17 DIAGNOSIS — N63 Unspecified lump in unspecified breast: Secondary | ICD-10-CM

## 2012-08-21 NOTE — Addendum Note (Signed)
Encounter addended by: Saintclair Halsted, RN on: 08/21/2012 10:01 AM<BR>     Documentation filed: Notes Section

## 2012-08-21 NOTE — Progress Notes (Addendum)
Patient referred to [facility] from the [facility] due to multiple right axillary masses that are highly suspicious of malignancy and a biopsy is recommended. Screening mammogram completed at the [facility] 07/24/2012 and right breast diagnostic mammogram completed at the [facility] 08/07/2012.  Pap Smear:    Completed Pap smear today. Last Pap smear was 01/16/2006 and normal. Per patient no history of abnormal Pap smears. Pap smear result above is in EPIC.  Physical exam: Breasts Breasts symmetrical. No skin abnormalities bilateral breasts. No nipple retraction bilateral breasts. No nipple discharge bilateral breasts. No lymphadenopathy left breast. Palpated multiple fixed masses in the right axilla.  No lumps palpated bilateral breasts. No complaints of pain or tenderness on exam. Patient is referred to the [facility] for right axillary biopsy per recommendation. Appointment scheduled for Thursday, August 23, 2012 at 1245.     Pelvic/Bimanual   Ext Genitalia No lesions, no swelling and no discharge observed on external genitalia.         Vagina Vagina pink and normal texture. No lesions or discharge observed in vagina.          Cervix Cervix is present. Cervix pink and of normal texture. No discharge observed on cervix. Cervix is tilted to the left.    Uterus Uterus is present and palpable. Uterus positioned slightly to the left and normal size.       Adnexae Bilateral ovaries present and palpable. No tenderness on palpation.        Rectovaginal No rectal exam completed today since patient had no rectal complaints. No skin abnormalities observed on exam.

## 2012-08-21 NOTE — Assessment & Plan Note (Signed)
Multiple right axillary lumps. Patient is referred to the [facility] for right axillary biopsy per recommendation. Appointment scheduled for Thursday, August 23, 2012 at 1245.

## 2012-08-21 NOTE — Patient Instructions (Signed)
Taught patient how to perform BSE and gave educational materials to take home. Let patient know that if her Pap smear today is normal that her next Pap smear will be due in 3 years. Let her know BCCCP will cover Pap smears every 3 years unless has a history of abnormal Pap smears. Patient is referred to the [facility] for right axillary biopsy per recommendation. Appointment scheduled for Thursday, August 23, 2012 at 1245.  Patient aware of appointment and will be there. Let patient know will follow up with her within the next couple weeks with results. Patient verbalized understanding.

## 2012-08-22 ENCOUNTER — Encounter: Payer: Self-pay | Admitting: *Deleted

## 2012-08-23 ENCOUNTER — Ambulatory Visit
Admission: RE | Admit: 2012-08-23 | Discharge: 2012-08-23 | Disposition: A | Discharge status: Home / Self Care | Payer: No Typology Code available for payment source | Source: Ambulatory Visit | Attending: Obstetrics and Gynecology | Admitting: Obstetrics and Gynecology

## 2012-08-23 DIAGNOSIS — N63 Unspecified lump in unspecified breast: Secondary | ICD-10-CM

## 2012-08-24 ENCOUNTER — Telehealth: Payer: Self-pay | Admitting: Family Medicine

## 2012-08-24 NOTE — Telephone Encounter (Signed)
Called patient to discuss pathology results, invasive carcinoma. She is aware of results. She will see Dr. Jamey Ripa at [facility]. To discuss surgical options.  She is still have R shoulder pain, treating with tylenol only.  She has tramadol available to her if the pain worsens. She prefers tylenol for now.   As she lives alone and has no family in town she asked about home health post surgery. I informed her that that is a possibility. I am happy to arrange home health once surgery scheduled.   She had no other questions or concerns.

## 2012-08-28 ENCOUNTER — Other Ambulatory Visit (INDEPENDENT_AMBULATORY_CARE_PROVIDER_SITE_OTHER): Payer: Self-pay | Admitting: Surgery

## 2012-08-28 ENCOUNTER — Encounter (INDEPENDENT_AMBULATORY_CARE_PROVIDER_SITE_OTHER): Payer: Medicaid Other | Admitting: Surgery

## 2012-08-28 DIAGNOSIS — C50911 Malignant neoplasm of unspecified site of right female breast: Secondary | ICD-10-CM

## 2012-08-29 ENCOUNTER — Telehealth (HOSPITAL_COMMUNITY): Payer: Self-pay | Admitting: *Deleted

## 2012-08-29 NOTE — Telephone Encounter (Signed)
Telephoned patient and set up appointment to fill out [facility] Friday Nov 1 10:30

## 2012-08-31 ENCOUNTER — Ambulatory Visit (HOSPITAL_COMMUNITY)
Admission: RE | Admit: 2012-08-31 | Discharge: 2012-08-31 | Disposition: A | Discharge status: Home / Self Care | Payer: Medicaid Other | Source: Ambulatory Visit | Attending: Surgery | Admitting: Surgery

## 2012-08-31 DIAGNOSIS — C50911 Malignant neoplasm of unspecified site of right female breast: Secondary | ICD-10-CM

## 2012-08-31 DIAGNOSIS — C50919 Malignant neoplasm of unspecified site of unspecified female breast: Secondary | ICD-10-CM | POA: Insufficient documentation

## 2012-08-31 LAB — CREATININE, SERUM
Creatinine, Ser: 0.75 mg/dL (ref 0.50–1.10)
GFR calc Af Amer: >90 mL/min (ref >90)

## 2012-08-31 MED ORDER — GADOBENATE DIMEGLUMINE 529 MG/ML IV SOLN
11.0000 mL | Freq: Once | INTRAVENOUS | Status: AC | PRN
Start: 2012-08-31 — End: 2012-08-31
  Administered 2012-08-31: 11 mL via INTRAVENOUS

## 2012-09-04 ENCOUNTER — Ambulatory Visit (INDEPENDENT_AMBULATORY_CARE_PROVIDER_SITE_OTHER): Payer: Medicaid Other | Admitting: Surgery

## 2012-09-04 ENCOUNTER — Encounter (INDEPENDENT_AMBULATORY_CARE_PROVIDER_SITE_OTHER): Payer: Self-pay | Admitting: Surgery

## 2012-09-04 VITALS — BP 112/68 | HR 74 | Temp 97.2°F | Resp 16 | Ht 60.0 in | Wt 127.1 lb

## 2012-09-04 DIAGNOSIS — C50919 Malignant neoplasm of unspecified site of unspecified female breast: Secondary | ICD-10-CM

## 2012-09-04 NOTE — Progress Notes (Signed)
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 She also needs metastatic workup and I think is going to need postoperative chemotherapy. I reviewed the possible need for a Port-A-Cath. She had her prior cancer she did not need a port. I told she may have metastatic disease. I told her that she'll be presented at the breast cancer conference tomorrow and will make further recommendations about workup et cetera.                 CC:  Chief Complaint  Patient presents with  . Breast Cancer    Axillary node, left    HPI:  Debbie Larsen is a 60 y.o.  female who presents for evaluation of a newly diagnosed recurrence in her right axilla of her breast cancer. On July 17/2002 she underwent a right sentinel lymph node evaluation and partial mastectomy with the low for a right breast cancer. This was receptor positive at 95% and 96% with a Ki-67 of 11%. Ferritin was 1%, negative. 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 One lymph node was positive for metastatic carcinoma. One lymph node was negative for metastatic carcinoma. She has subsequent radiation therapy, 6 megavolt O. Times tangential fields to the right breast and left anterior oblique field to the right supraclavicular/axillary region. The dose was prescribed at 3 cm depth. 15 megavolt electron, right breast boost. She had a total of 5040 and 28 sessions with a 1200 boost in succession.  In August of this year she felt something in the right armpit and this is gotten larger, harder  and has become painful. She was seen in the breast clinic for evaluation and a mammogram ultrasound done of the hard mass was found. This was biopsied and is invasive ductal carcinoma, ER is negative at 0% PR negative at 0% and a Ki-67 at 59%. HER-2/neu was negative. An MRI was done confirming multiple axillary lymph nodes. There is also questionable lesion in the liver.  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  PSH:   has past surgical history that includes Tonsillectomy; Breast lumpectomy (04/2001); and Breast lumpectomy (05/16/2001).  ALLERGIES:  No Known Allergies  MEDICATIONS: Current outpatient prescriptions:levothyroxine (SYNTHROID, LEVOTHROID) 75 MCG tablet, Take 1 tablet (75 mcg total) by mouth daily. (dosage change), Disp: 30 tablet, Rfl: 1;  acetaminophen (TYLENOL) 500 MG tablet, Take 500 mg by mouth every 4 (four) hours as needed. For pain., Disp: , Rfl: ;  aspirin 325 MG tablet, Take 1 tablet (325 mg total) by mouth daily., Disp: 30 tablet, Rfl: 0 cholecalciferol   2000 UNITS tablet, Take 1 tablet (2,000 Units total) by mouth daily., Disp: , Rfl: ;  loratadine (CLARITIN) 10 MG tablet, Take 1 tablet (10 mg total) by mouth daily., Disp: 30 tablet, Rfl: 11;  traMADol (ULTRAM) 50 MG tablet, Take 1 tablet (50 mg total) by mouth every 8 (eight) hours as needed for pain., Disp: 30 tablet, Rfl: 1  ROS: She has filled out our 12 point review of systems and it is negative . EXAM:   VITAL SIGNS: BP 112/68  Pulse 74  Temp 97.2 F (36.2 C) (Temporal)  Resp 16  Ht 5' (1.524 m)  Wt 127 lb 2 oz (57.664 kg)  BMI 24.83 kg/m2 GENERAL:  The patient is alert, oriented, and generally healthy-appearing, NAD. Mood and affect are normal.  HEENT:  The head is normocephalic, the eyes nonicteric, the pupils were round regular and equal. EOMs are normal. Pharynx normal. Dentition good.  NECK:  The neck is  supple and there are no masses or thyromegaly.  LUNGS: Normal respirations and clear to auscultation.  HEART: Regular rhythm, with no murmurs rubs or gallops. Pulses are intact carotid dorsalis pedis and posterior tibial. No significant varicosities are noted.  BREASTS:  breasts status post lumpectomy on the right side no evidence of a breast lesion. The left breast is unremarkable. LYMPHATICS: There are matted right axillary lymph nodes which are slightly tender. There do not appear to be any supraclavicular nodes on either side and no left axillary nodes.  ABDOMEN: Soft, flat, and nontender. No masses or organomegaly is noted. No hernias are noted. Bowel sounds are normal.  EXTREMITIES:  Good range of motion, no edema.   DATA REVIEWED:  I have reviewed pathology reports MRI and mammogram reports    Logann Whitebread J 09/04/2012  CC: Funches, Josalyn, MD, FUNCHES,JOSALYN, MD        

## 2012-09-04 NOTE — Patient Instructions (Signed)
We will discuss your situation at the breast cancer conference tomorrow and I will call you after that. We can then make plans for any other tests and surgery

## 2012-09-05 ENCOUNTER — Telehealth (INDEPENDENT_AMBULATORY_CARE_PROVIDER_SITE_OTHER): Payer: Self-pay | Admitting: Surgery

## 2012-09-05 ENCOUNTER — Other Ambulatory Visit (INDEPENDENT_AMBULATORY_CARE_PROVIDER_SITE_OTHER): Payer: Self-pay | Admitting: Surgery

## 2012-09-05 NOTE — Telephone Encounter (Signed)
Reviewed with her the discussion from breast conference this am - recommendation for staging scans, chemo, and axillary dissection, although the latter may need to be deferred till after Chemo - decision to be made by Dr Darnelle Catalan.

## 2012-09-05 NOTE — Addendum Note (Signed)
Addended byLiliana Cline on: 09/05/2012 02:33 PM   Modules accepted: Orders

## 2012-09-06 ENCOUNTER — Telehealth: Payer: Self-pay | Admitting: *Deleted

## 2012-09-06 ENCOUNTER — Telehealth: Payer: Self-pay | Admitting: Family Medicine

## 2012-09-06 ENCOUNTER — Telehealth: Payer: Self-pay | Admitting: Oncology

## 2012-09-06 ENCOUNTER — Encounter: Payer: Self-pay | Admitting: Radiation Oncology

## 2012-09-06 ENCOUNTER — Other Ambulatory Visit: Payer: Self-pay | Admitting: Oncology

## 2012-09-06 DIAGNOSIS — C50919 Malignant neoplasm of unspecified site of unspecified female breast: Secondary | ICD-10-CM

## 2012-09-06 MED ORDER — LORAZEPAM 0.5 MG PO TABS: 0.5000 mg | ORAL_TABLET | Freq: Two times a day (BID) | ORAL | Status: DC | PRN

## 2012-09-06 NOTE — Telephone Encounter (Signed)
Called patient back to discuss MRI findings and treatment plans.  She is eager to have her Pet scan and surgery done.  She is looking forward to her visit with Dr. Darnelle Catalan (oncologist).  She is still experiencing significant R shoulder pain but has not tried tramadol yet. She was worried about taking too much tylenol. I reassured her that there is no tylenol in tramadol.   Regarding anxiety, she is anxious. She would try ativan prn. Will prescribe. Advised not to mix with tramadol as the combo may be too sedating.   Regarding assistance at home, she lives alone. She would like home health RN post surgery. I will order.   Regarding transportation assistance as she is having difficulty driving:  1. Provided patient with [place] information (phone # and website).  http://www.[place].gov/index.aspx?page=2188 [phone]  2. Reminded patient that there are transportation services available through the cancer center (volunteer drivers).

## 2012-09-06 NOTE — Progress Notes (Signed)
Chart note: The patient was presented this past Wednesday at the multidisciplinary breast cancer conference. She was found to have a right axillary recurrence. I retrieved her records from 2002. She presented with a 1.6 cm invasive ductal carcinoma along with DCIS they came to within 2 mm of the surgical margin within the upper-outer quadrant of the right breast. One of 2 lymph nodes contained metastatic disease. She had a 6 mm deposit within one of the sentinel lymph nodes and there was 1 mm of extracapsular extension. Her primary tumor was strongly ER/PR positive, HER-2 negative with a low proliferation index. She received radiation therapy to her right breast and regional lymph nodes which encompassed the right supraclavicular and axillary regions. 5040 cGy/28 was delivered to her right breast and regional lymph nodes with a boost of 1200 cGy to her right breast tumor bed. I reviewed her fields and dosimetry. She is not a candidate for further radiation therapy to her right axilla. I am forwarding this information to Dr. Jamey Ripa and Dr. Darnelle Catalan.

## 2012-09-06 NOTE — Telephone Encounter (Signed)
Pt is asking for Dr Armen Pickup to call her in regard to her MRI - is expecting the call today

## 2012-09-06 NOTE — Telephone Encounter (Signed)
I called the patient to follow up with her request to see Dr. Darnelle Catalan.  He is out until 11/18 but will schedule her with him as soon as possible.  I reviewed the case with Dr> Donnie Coffin who is familiar with the case because it was presented this am.   I entered a verbal order for a PET scan for staging.

## 2012-09-06 NOTE — Telephone Encounter (Signed)
Received call from [facility] at [facility] stating patient is scheduled for surgery  and if very anxoius about upcoming surgery and her diagnosis.  Pam has spoken with her several times today.  Pam  is asking if PCP could prescribe her something for anxiety . Will forward message to Dr. Armen Pickup. Can call Pam back at [phone].

## 2012-09-06 NOTE — Telephone Encounter (Signed)
Addressed, see phone message.

## 2012-09-06 NOTE — Telephone Encounter (Signed)
Made patient appointment for pet scan on 09-12-2012 arrival time 10:15am

## 2012-09-07 ENCOUNTER — Telehealth: Payer: Self-pay | Admitting: Oncology

## 2012-09-07 NOTE — Telephone Encounter (Signed)
I have talked to the patient several times over the last two days.   She is scheduled for a PET scan ordered by Dr> Donnie Coffin for Dr. Darnelle Catalan, who is out.   She is very anxious and scared.   She has had shoulder pain since last summer and was told early Oct that her axillary nodes were enlarged.   She is very afraid that the cancer is spreading.  She is scheduled for surgery 10/01/12.  I referred her to [facility], [facility] and [facility].   I called her primary clinic to request something for anxiety.  The patient has tramadol at [employer] but was afraid to take it and I strongly encouraged her to take it for pain.   Support given.   I will follow the patient closely.

## 2012-09-10 ENCOUNTER — Other Ambulatory Visit: Payer: Self-pay | Admitting: *Deleted

## 2012-09-10 DIAGNOSIS — C50919 Malignant neoplasm of unspecified site of unspecified female breast: Secondary | ICD-10-CM

## 2012-09-11 ENCOUNTER — Other Ambulatory Visit: Payer: Self-pay | Admitting: *Deleted

## 2012-09-11 DIAGNOSIS — E039 Hypothyroidism, unspecified: Secondary | ICD-10-CM

## 2012-09-12 ENCOUNTER — Encounter: Payer: Self-pay | Admitting: Specialist

## 2012-09-12 ENCOUNTER — Encounter (HOSPITAL_COMMUNITY)
Admission: RE | Admit: 2012-09-12 | Discharge: 2012-09-12 | Disposition: A | Discharge status: Home / Self Care | Payer: Medicaid Other | Source: Ambulatory Visit | Attending: Oncology | Admitting: Oncology

## 2012-09-12 ENCOUNTER — Ambulatory Visit (HOSPITAL_BASED_OUTPATIENT_CLINIC_OR_DEPARTMENT_OTHER): Payer: Self-pay | Admitting: Oncology

## 2012-09-12 ENCOUNTER — Encounter: Payer: Self-pay | Admitting: Oncology

## 2012-09-12 ENCOUNTER — Ambulatory Visit: Payer: Self-pay

## 2012-09-12 ENCOUNTER — Other Ambulatory Visit: Payer: Self-pay

## 2012-09-12 ENCOUNTER — Telehealth: Payer: Self-pay | Admitting: Oncology

## 2012-09-12 VITALS — BP 123/76 | HR 71 | Temp 98.9°F | Resp 20 | Ht 60.0 in | Wt 127.4 lb

## 2012-09-12 DIAGNOSIS — Z170 Estrogen receptor positive status [ER+]: Secondary | ICD-10-CM

## 2012-09-12 DIAGNOSIS — C773 Secondary and unspecified malignant neoplasm of axilla and upper limb lymph nodes: Secondary | ICD-10-CM

## 2012-09-12 DIAGNOSIS — C50919 Malignant neoplasm of unspecified site of unspecified female breast: Secondary | ICD-10-CM | POA: Insufficient documentation

## 2012-09-12 DIAGNOSIS — R918 Other nonspecific abnormal finding of lung field: Secondary | ICD-10-CM | POA: Insufficient documentation

## 2012-09-12 LAB — GLUCOSE, CAPILLARY: Glucose-Capillary: 126 mg/dL — ABNORMAL HIGH (ref 70–99)

## 2012-09-12 MED ORDER — LEVOTHYROXINE SODIUM 75 MCG PO TABS: 75.0000 ug | ORAL_TABLET | Freq: Every day | ORAL | Status: DC

## 2012-09-12 MED ORDER — FLUDEOXYGLUCOSE F - 18 (FDG) INJECTION
17.5000 | Freq: Once | INTRAVENOUS | Status: AC | PRN
  Administered 2012-09-12: 17.5 via INTRAVENOUS

## 2012-09-12 NOTE — Progress Notes (Signed)
CC: Debbie Larsen    HPI: This is a 61 year old woman of [place] extraction who lives in [facility], who presents for evaluation of locally advanced and likely metastatic breast cancer.  Interval History: This patient was initially diagnosed with right breast cancer in 2002. At that time she had an ER/PR positive, HER-2 negative breast cancer. Tumor was 1.6 cm. One out of 2 lymph nodes had cancer noted. See radiation therapy to the right breast regional lymph nodes encompass the right supraclavicular and axillary regions. She also received boost. She did not take tamoxifen therapy. Of note is that she did take adjuvant chemotherapy in the form of CMF which and took approximately 6 cycles. She appears to have right shoulder pain for about a year now. She has had followup mammograms of up until 2011. Other there is no mammogram performed in 2012. She been seen by physical therapy in orthopedic surgery for evaluation of right shoulder pain. Also she had a mammogram on 07/24/2012 which showed a suspicious mass behind eczema of the right breast. Physical exam the time did show multiple involved right axillary lymph node. 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 HER-2 nonamplified the ratio 1.43. Proliferative index was elevated at 50%. MRI scan of both breasts performed on 08/31/2012 showed multiple involved lymph nodes in the right axilla, no obvious masses in either breast. Patient is been seen by surgery and recommendation was initially made to good node dissection pending. She scans. She did have a PET scan yesterday, details are below. Essentially this showed uptake in the right axilla as well as mediastinum and small pulmonary nodules seen which are also FDG avid. Patient is seen today for consultation evaluation.  Past Medical History  Diagnosis Date  . 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 Hypothyroid   . Enlarged lymph nodes     . BREAST CANCER 04/2001    s/p radiation therapy, chemotherapy and lumpectomy    Past Surgical History  Procedure Date  . Tonsillectomy   . Breast lumpectomy 04/2001    excision of L breast fibroadenoma by Dr.   . Breast lumpectomy 05/16/2001    Right axillary sentinel lymph node biopsy.    Family History  Problem Relation Age of Onset  . Diabetes Mother   . Hypertension Father   . Hypertension Sister   . Hypothyroidism Brother   . Cancer Neg Hx     Gynecologic history: G1P1  Social History: She is divorced not currently working. She's been married for 22 years divorced for 18 years. She is an adult son age 48 who works in [place] and has an Publishing copy. She is one brother 1 sister who lives in [place] and 2 siblings who live in [place]. Mother passed away in 08-09-23 of this year and year. Father died complications of heart disease in 2005. Patient herself has been educated with a has  A BSO  Health maintenance:  The patient  reports that she has never smoked. She has never used smokeless tobacco. She reports that she does not drink alcohol or use illicit drugs.   Cholesterol   Bone density   Colonoscopy  (PSA)   (PAP)   Allergies: No Known Allergies     Current Outpatient Prescriptions  Medication Sig Dispense Refill  . acetaminophen (TYLENOL) 500 MG tablet Take 500 mg by mouth every 4 (four) hours as needed. For pain.      Marland Kitchen aspirin 325 MG tablet Take  1 tablet (325 mg total) by mouth daily.  30 tablet  0  . cholecalciferol 2000 UNITS tablet Take 1 tablet (2,000 Units total) by mouth daily.      Marland Kitchen levothyroxine (SYNTHROID, LEVOTHROID) 75 MCG tablet Take 1 tablet (75 mcg total) by mouth daily. (dosage change)  30 tablet  1  . loratadine (CLARITIN) 10 MG tablet Take 1 tablet (10 mg total) by mouth daily.  30 tablet  11  . LORazepam (ATIVAN) 0.5 MG tablet Take 1 tablet (0.5 mg total) by mouth 2 (two) times daily as needed for anxiety.  30 tablet  1  . traMADol (ULTRAM) 50  MG tablet Take 1 tablet (50 mg total) by mouth every 8 (eight) hours as needed for pain.  30 tablet  1   No current facility-administered medications for this visit.   Facility-Administered Medications Ordered in Other Visits  Medication Dose Route Frequency Provider Last Rate Last Dose  . [COMPLETED] fludeoxyglucose F - 18 (FDG) injection 17.5 milli Curie  17.5 milli Curie Intravenous Once PRN Medication Radiologist, MD   17.5 milli Curie at 09/12/12 1055    ROS patient essentially complains of ongoing pain in her right axilla. She is on Vicodin for this. She also notes a slight weakness in her right arm. She denies other complaints.  Physical Exam:  Blood pressure 123/76, pulse 71, temperature 98.9 F (37.2 C), temperature source Oral, resp. rate 20, height 5' (1.524 m), weight 127 lb 6.4 oz (57.788 kg).  Sclerae unicteric Oropharynx clear No peripheral adenopathy Lungs no rales or rhonchi Heart regular rate and rhythm Abd benign MSK no focal spinal tenderness, no peripheral edema Neuro: nonfocal Breasts: There are multiple involved lymph nodes in the right axilla. He had noted to be fixed to the underlying tissue. I cannot detect any masses in either breast. Left axilla is negative.  LABS   Results for orders placed during the hospital encounter of 09/12/12 (from the past 48 hour(s))  GLUCOSE, CAPILLARY     Status: Abnormal   Collection Time   09/12/12 10:50 AM      Component Value Range Comment   Glucose-Capillary 126 (*) 70 - 99 mg/dL        FILMS:  Nm Pet Image Initial (pi) Skull Base To Thigh  09/12/2012  *RADIOLOGY REPORT*  Clinical Data: Subsequent treatment strategy for breast cancer. History right breast cancer in 2002  NUCLEAR MEDICINE PET SKULL BASE TO THIGH  Fasting Blood Glucose:  126  Technique:  17.5 mCi F-18 FDG was injected intravenously.   CT data was obtained and used for attenuation correction and anatomic localization only.  (This was not acquired as a  diagnostic CT examination.) Additional exam technical data entered  on technologist worksheet.  Comparison: None  Findings:  Neck: No hypermetabolic nodes in the neck.  There is misregistration between the CT data set and the PET data set in the head neck.  No hypermetabolic neck lymph nodes are noted.  Of the  Chest: There is hypermetabolic mass in the right axilla measuring 18 mm (image 55) with intense metabolic activity ( SUV max = 9.7. Several smaller hypermetabolic axillary nodes more superiorly are evident on the PET data set image 42 and 47.  In the mediastinum, there is hypermetabolic paratracheal lymph node on the right measuring 18 mm short axis with intense metabolic activity ( SUV max = 16.1).  Hypermetabolic subcarinal and left hilar lymph node.  Hypermetabolic prevascular lymph node measures 10 mm with SUV  max = 8.3.  Review of the lung parenchyma demonstrates several hypermetabolic nodules within the left lung.  These include 7 mm nodule (image 71) in the left upper lobe and an 8 mm nodule within the left lower lobe. These have mild metabolic activity ( S U V max 3.0)  and concerning for metastasis.   There is a fibrotic pattern in the lateral right upper lobe which likely represents radiation change.  Abdomen / Pelvis:There is no discrete abnormal hypermetabolic activity within the right hepatic lobe correspond to the MRI findings.  No hypermetabolic abdominal or pelvic lymph nodes.  Skeleton:No focal hypermetabolic activity to suggest skeletal metastasis.  IMPRESSION:  1.  Hypermetabolic right axillary lymph nodes consistent with local recurrence  of breast cancer. 2.  Hypermetabolic ipsilateral and contralateral mediastinal nodal metastasis. 3.  Hypermetabolic nodules within the left lung most concerning for pulmonary metastasis. 4. No PET CT evidence of hepatic metastasis.   Original Report Authenticated By: Genevive Bi, M.D.      Assessment: 61 year old woman who presents with new breast  cancer involving right breast and axilla with no obvious primary seen in the right breast. This appears to be a different phenotype original cancer. This appears to be a metastatic triple negative breast cancer.    Plan: I spent about 35 minutes discussing my plan with the patient. I emphasized that she needs to have chemotherapy on a semiurgent basis to try 148 any further deterioration in the strength of right hand and arm. She has weakening of the intrinsic muscles of the right hand. I think that surgery to try to remove the lymph nodes in the axilla would cause further damage to the brachial plexus. I recommended single agent chemotherapy as she is somewhat anxious in and frail. I would recommend using Abraxane. She has a port scheduled to be placed in early December so in the interim I would recommend we start with low weekly Abraxane therapy. We will schedule her for chemotherapy teaching class and hopefully can begin chemotherapy next week. I did explain the side effects of chemotherapy tumor. She is interested in some research studies in may want to investigate vaccine studies at an academic center.  Faun Mcqueen 09/12/2012, 6:44 PM

## 2012-09-12 NOTE — Patient Instructions (Addendum)
  YOU HAVE STAGE 4 BREAST CANCER  YOU NEED CHEMOTHERAPY WE WILL BEGIN BY GIVING YOU A DRUG CALLED ABRAXANE , WEEKLY FOR 2-3 WEEKS IN A ROW, THEN 1 WEEK OFF  WE WILL SCHEDULE A CHEMO CLASS SIDE EFFECTS HAIR LOSS MILD FATIGUE NO NAUSEA  DR STRECK WILL THEN PUT A PORT IN FOR GIVING CHEMOTHERAPY LONG TERM

## 2012-09-12 NOTE — Progress Notes (Signed)
The patient came to my office today between medical appointments.  She expressed feeling anxious about the results of her PET scan, which she would get later in the day in her appointment with Dr. Donnie Coffin.  She asked about support programs, specifically programs on meditation.  She remembers some of those offerings being helpful to her ten years ago when she first was diagnosed with breast cancer.  I gave her information on support programs and offered to be of assistance in supporting her.

## 2012-09-12 NOTE — Progress Notes (Signed)
Checked in new pt w/ no financial concerns. °

## 2012-09-13 ENCOUNTER — Encounter: Payer: Self-pay | Admitting: *Deleted

## 2012-09-13 ENCOUNTER — Other Ambulatory Visit: Payer: Self-pay | Admitting: Oncology

## 2012-09-13 ENCOUNTER — Other Ambulatory Visit: Payer: Self-pay

## 2012-09-13 NOTE — Telephone Encounter (Signed)
ched class added for 11/19 per Tami - see also 11/13 pof. Pt has not been contacted to hear back from Tami as it whether pt will have another f/u 11/19 also. Per Tami pt wants to s/w PR again before starting tx and Tami is going to see if PR will see pt 11/19.

## 2012-09-13 NOTE — Progress Notes (Signed)
Mailed after appt letter to pt. 

## 2012-09-14 ENCOUNTER — Telehealth: Payer: Self-pay | Admitting: Oncology

## 2012-09-14 ENCOUNTER — Other Ambulatory Visit: Payer: Self-pay | Admitting: Oncology

## 2012-09-14 ENCOUNTER — Other Ambulatory Visit (INDEPENDENT_AMBULATORY_CARE_PROVIDER_SITE_OTHER): Payer: Self-pay | Admitting: Surgery

## 2012-09-14 NOTE — Telephone Encounter (Signed)
I talked to the patient about her chemo plans, port insertion.   I called [facility] and asked them to draw her labs before she starts chemo.  We discussed her overall situation and she has family out of town.  Unfortunately her family [place] of [place] and they are not able to be with her.   She is not interested in moving.   Her neighbors are the only people that can help her.  I had referred her to the patient and family support team.

## 2012-09-17 ENCOUNTER — Other Ambulatory Visit: Payer: Self-pay | Admitting: Oncology

## 2012-09-18 ENCOUNTER — Other Ambulatory Visit: Payer: Self-pay

## 2012-09-18 ENCOUNTER — Other Ambulatory Visit: Payer: Self-pay | Admitting: *Deleted

## 2012-09-18 ENCOUNTER — Encounter (HOSPITAL_BASED_OUTPATIENT_CLINIC_OR_DEPARTMENT_OTHER)
Admission: RE | Admit: 2012-09-18 | Discharge: 2012-09-18 | Disposition: A | Discharge status: Home / Self Care | Payer: Medicaid Other | Source: Ambulatory Visit | Attending: Surgery | Admitting: Surgery

## 2012-09-18 ENCOUNTER — Encounter (HOSPITAL_COMMUNITY): Payer: Self-pay

## 2012-09-18 ENCOUNTER — Encounter: Payer: Self-pay | Admitting: *Deleted

## 2012-09-18 ENCOUNTER — Other Ambulatory Visit: Payer: Self-pay | Admitting: Oncology

## 2012-09-18 VITALS — BP 135/88 | HR 95 | Temp 98.3°F | Resp 20 | Ht 60.0 in | Wt 125.2 lb

## 2012-09-18 DIAGNOSIS — Z01810 Encounter for preprocedural cardiovascular examination: Secondary | ICD-10-CM | POA: Insufficient documentation

## 2012-09-18 DIAGNOSIS — R0789 Other chest pain: Secondary | ICD-10-CM

## 2012-09-18 DIAGNOSIS — C50919 Malignant neoplasm of unspecified site of unspecified female breast: Secondary | ICD-10-CM

## 2012-09-18 LAB — CBC WITH DIFFERENTIAL/PLATELET
Basophils Absolute: 0 10*3/uL (ref 0.0–0.1)
Eosinophils Absolute: 0 10*3/uL (ref 0.0–0.7)
Eosinophils Relative: 0 % (ref 0–5)
Lymphs Abs: 2.7 10*3/uL (ref 0.7–4.0)
MCH: 28.5 pg (ref 26.0–34.0)
MCV: 84.2 fL (ref 78.0–100.0)
Platelets: 271 10*3/uL (ref 150–400)
RDW: 13.3 % (ref 11.5–15.5)

## 2012-09-18 LAB — LACTATE DEHYDROGENASE: LDH: 193 U/L (ref 94–250)

## 2012-09-18 LAB — COMPREHENSIVE METABOLIC PANEL
AST: 18 U/L (ref 0–37)
Albumin: 4.4 g/dL (ref 3.5–5.2)
Alkaline Phosphatase: 81 U/L (ref 39–117)
Chloride: 103 mEq/L (ref 96–112)
Potassium: 3.7 mEq/L (ref 3.5–5.1)
Sodium: 138 mEq/L (ref 135–145)
Total Bilirubin: 0.3 mg/dL (ref 0.3–1.2)

## 2012-09-18 LAB — SURGICAL PCR SCREEN: MRSA, PCR: NEGATIVE

## 2012-09-18 MED ORDER — CEFAZOLIN SODIUM-DEXTROSE 2-3 GM-% IV SOLR
2.0000 g | INTRAVENOUS | Status: AC
  Administered 2012-09-19: 2 g via INTRAVENOUS
  Filled 2012-09-18: qty 50

## 2012-09-18 MED ORDER — LIDOCAINE-PRILOCAINE 2.5-2.5 % EX CREA: TOPICAL_CREAM | CUTANEOUS | Status: AC | PRN

## 2012-09-18 MED ORDER — PROCHLORPERAZINE MALEATE 10 MG PO TABS: 10.0000 mg | ORAL_TABLET | Freq: Four times a day (QID) | ORAL | Status: DC | PRN

## 2012-09-18 NOTE — Progress Notes (Addendum)
Note on chart that Dr. Donnie Coffin had orders that he wanted done during PAT. Due to them being placed in as an office visit lab reordered after speaking to Ivins, RN and verifying labs to be done as a verbal order. Spoke to nursing office at [facility] regarding separate orders for consent with different wording. State they will leave message for Dr. Jamey Ripa to return call. Revonda Standard, PA requested to review cardiac records

## 2012-09-18 NOTE — Consult Note (Signed)
Cardiology Consult Note   Patient ID: Debbie Larsen MRN: [id number], DOB/AGE: 61-Aug-1952   Admit date: 09/18/2012 Date of Consult: 09/18/2012  Primary Physician: [name], MD Primary Cardiologist: Olga Millers, MD  Reason for consult: pre-op evaluation  HPI: Debbie Larsen is a 61yo female w/ PMHx s/f breast CA (s/p lumpectomy, RXT, CXT), hypothyroidism and hyperlipidemia who presented to [facility] as an outpatient today to undergo lab work prior to Port-A-Cath insertion tomorrow. She has unfortunately developed R axillary, ipsilateral, contralateral lymphadenopathy c/w local breast cancer recurrence and left lung hypermetabolic nodules concerning for pulmonary metastasis. She was seen in the office as a referral in 07/2012 for chest pain. She has no history of CAD, and only cardiac risk factor is HL. She reported experiencing substernal dull chest discomfort on exertion for 2-3 weeks, improved with rest. On follow-up 04/2012 echo was noted to have preserved LVEF without focal WMAs. EKG was without ischemia or prior infarct. Initially, stress echo was scheduled for formal ischemic evaluation; however, the patient's chest pain improved and this was deferred. Since that time, she denies any further chest discomfort, and attributes it to her thoracic lymphadenopathy. She is fairly active going on long walks and working in the yard now without incident.  Problem List: Past Medical History  Diagnosis Date  . 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 Hypothyroid   . Enlarged lymph nodes   . BREAST CANCER 04/2001    s/p radiation therapy, chemotherapy and lumpectomy    Past Surgical History  Procedure Date  . Tonsillectomy   . Breast lumpectomy 04/2001    excision of L breast fibroadenoma by Dr.   . Breast lumpectomy 05/16/2001    Right axillary sentinel lymph node biopsy.     Allergies: No Known Allergies  Home Medications: Prior to Admission medications   Medication Sig Start Date End  Date Taking? Authorizing Provider  acetaminophen (TYLENOL) 500 MG tablet Take 500 mg by mouth every 4 (four) hours as needed. For pain.    Historical Provider, MD  aspirin 325 MG tablet Take 1 tablet (325 mg total) by mouth daily. 05/12/12 05/12/13  Phebe Colla, MD  cholecalciferol 2000 UNITS tablet Take 1 tablet (2,000 Units total) by mouth daily. 06/28/12 06/28/13  [name], MD  levothyroxine (SYNTHROID, LEVOTHROID) 75 MCG tablet Take 1 tablet (75 mcg total) by mouth daily. (dosage change) 09/11/12   [name], MD  lidocaine-prilocaine (EMLA) cream Apply topically as needed. Apply to port 1-2 hours before procedure 09/18/12   Pierce Crane, MD  loratadine (CLARITIN) 10 MG tablet Take 1 tablet (10 mg total) by mouth daily. 07/13/12 07/13/13  Brent Bulla, MD  LORazepam (ATIVAN) 0.5 MG tablet Take 1 tablet (0.5 mg total) by mouth 2 (two) times daily as needed for anxiety. 09/06/12   Josalyn Funches, MD  prochlorperazine (COMPAZINE) 10 MG tablet Take 1 tablet (10 mg total) by mouth every 6 (six) hours as needed. 09/18/12   Pierce Crane, MD  traMADol (ULTRAM) 50 MG tablet Take 1 tablet (50 mg total) by mouth every 8 (eight) hours as needed for pain. 08/09/12   [name], MD    Inpatient Medications:      (Not in a hospital admission)  Family History  Problem Relation Age of Onset  . Diabetes Mother   . Hypertension Father   . Hypertension Sister   . Hypothyroidism Brother   . Cancer Neg Hx      History   Social History  . Marital  Status: Divorced    Spouse Name: N/A    Number of Children: 1   . Years of Education: N/A   Occupational History  .     Social History Main Topics  . Smoking status: Never Smoker   . Smokeless tobacco: Never Used  . Alcohol Use: No  . Drug Use: No  . Sexually Active: Not Currently   Other Topics Concern  . Not on file   Social History Narrative   Lives alone. Has help from friends in her neighborhood. Debbie Larsen died of cardiac arrest,  07/25/2012. Brother lives in [place], [place].Sister lives in [place], [place].      Review of Systems: General: negative for chills, fever, night sweats or weight changes.  Cardiovascular: negative for chest pain, dyspnea on exertion, edema, orthopnea, palpitations, paroxysmal nocturnal dyspnea or shortness of breath Dermatological: negative for rash Respiratory: negative for cough or wheezing Urologic:  negative for hematuria Abdominal: negative for nausea, vomiting, diarrhea, bright red blood per rectum, melena, or hematemesis Neurologic: negative for visual changes, syncope, or dizziness All other systems reviewed and are otherwise negative except as noted above.  Physical Exam: Blood pressure 135/88, pulse 95, temperature 98.3 F (36.8 C), resp. rate 20, height 5' (1.524 m), weight 56.8 kg (125 lb 3.5 oz), SpO2 100.00%.   General: Well developed, well nourished, in no acute distress. Head: Normocephalic, atraumatic, sclera non-icteric, no xanthomas, nares are without discharge.  Neck:  Negative for carotid bruits. JVD not elevated. Lungs: Clear bilaterally to auscultation without wheezes, rales, or rhonchi. Breathing is unlabored. Heart:  RRR with S1 S2. No murmurs, rubs, or gallops appreciated. Abdomen: Soft, non-tender, non-distended with normoactive bowel sounds. No hepatomegaly. No rebound/guarding. No obvious abdominal masses. Msk:  Strength and tone appears normal for age. Extremities:  No clubbing, cyanosis or edema.  Distal pedal pulses are 2+ and equal bilaterally. Neuro:  Alert and oriented X 3. Moves all extremities spontaneously. Psych:  Responds to questions appropriately with a normal affect.  Labs:  None  Radiology/Studies: Mr Breast Bilateral W Wo Contrast  08/31/2012  *RADIOLOGY REPORT*  Clinical Data: right lumpectomy in 2003 with recent ultrasound- guided core biopsy demonstrating metastatic right axillary adenopathy  BILATERAL BREAST MRI WITH AND WITHOUT CONTRAST   Technique: Multiplanar, multisequence MR images of both breasts were obtained prior to and following the intravenous administration of 11ml of multihance.  Three dimensional images were evaluated at the independent DynaCad workstation.  Comparison:  prior studies  Findings: There is minimal background parenchymal enhancement. Neither breast demosntrates evidence of mass or suspicious enhancment.  There are normal appearing left axillary lymph nodes. On the right, in the high axilla, there is a 12 x 10mm lymph node. Immediately inferior to this is a 27 x 22mm lymph node demonstrating rim enhancement.  An 8mm lymph node is immediately anterolateral to this, and there is a 1cm lymph node immediately posteromedial to the dominant lymph node.  None of the lymph nodes demonstrate normal morphology or normal fatty hila.  In the right lobe of the liver, there is a T2-hyperintense, enhancing mass measuring about 1.5cm, showing progressive enhancement throughout the study, and minimal early enhancement.  IMPRESSION: 1.  Abnormal right axillary lymph nodes consistent with metastasis.  2.   No suspicious findings within either breast.  3.   Enhancing mass right lobe of liver.  While hepatic metastasis is not excluded, this may represent a hemangioma.  Recommend further imaging evaluation of this finding.  Liver protocol CT or  MRI, or PET/CT could be considered to further evaluate.  BI-RADS CATEGORY 6:  Known biopsy-proven malignancy - appropriate action should be taken.   THREE-DIMENSIONAL MR IMAGE RENDERING ON INDEPENDENT WORKSTATION:  Three-dimensional MR images were rendered by post-processing of the original MR data on an independent workstation.  The three- dimensional MR images were interpreted, and findings were reported in the accompanying complete MRI report for this study.   Original Report Authenticated By: Esperanza Heir, M.D.    Nm Pet Image Initial (pi) Skull Base To Thigh  09/12/2012  *RADIOLOGY REPORT*   Clinical Data: Subsequent treatment strategy for breast cancer. History right breast cancer in 2002  NUCLEAR MEDICINE PET SKULL BASE TO THIGH  Fasting Blood Glucose:  126  Technique:  17.5 mCi F-18 FDG was injected intravenously.   CT data was obtained and used for attenuation correction and anatomic localization only.  (This was not acquired as a diagnostic CT examination.) Additional exam technical data entered  on technologist worksheet.  Comparison: None  Findings:  Neck: No hypermetabolic nodes in the neck.  There is misregistration between the CT data set and the PET data set in the head neck.  No hypermetabolic neck lymph nodes are noted.  Of the  Chest: There is hypermetabolic mass in the right axilla measuring 18 mm (image 55) with intense metabolic activity ( SUV max = 9.7. Several smaller hypermetabolic axillary nodes more superiorly are evident on the PET data set image 42 and 47.  In the mediastinum, there is hypermetabolic paratracheal lymph node on the right measuring 18 mm short axis with intense metabolic activity ( SUV max = 16.1).  Hypermetabolic subcarinal and left hilar lymph node.  Hypermetabolic prevascular lymph node measures 10 mm with SUV max = 8.3.  Review of the lung parenchyma demonstrates several hypermetabolic nodules within the left lung.  These include 7 mm nodule (image 71) in the left upper lobe and an 8 mm nodule within the left lower lobe. These have mild metabolic activity ( S U V max 3.0)  and concerning for metastasis.   There is a fibrotic pattern in the lateral right upper lobe which likely represents radiation change.  Abdomen / Pelvis:There is no discrete abnormal hypermetabolic activity within the right hepatic lobe correspond to the MRI findings.  No hypermetabolic abdominal or pelvic lymph nodes.  Skeleton:No focal hypermetabolic activity to suggest skeletal metastasis.  IMPRESSION:  1.  Hypermetabolic right axillary lymph nodes consistent with local recurrence  of  breast cancer. 2.  Hypermetabolic ipsilateral and contralateral mediastinal nodal metastasis. 3.  Hypermetabolic nodules within the left lung most concerning for pulmonary metastasis. 4. No PET CT evidence of hepatic metastasis.   Original Report Authenticated By: Genevive Bi, M.D.    [place] Core Biopsy  08/24/2012  **ADDENDUM** CREATED: 08/24/2012 16:14:33  Histologic evaluation demonstrates high-grade invasive carcinoma. This is concordant with the imaging findings.  Results were discussed with the patient by telephone at her request.  She reports no complications from the procedure.  Surgical consultation was scheduled with Dr. Jamey Ripa for 09/04/2012 at 3:40 p.m.  **END ADDENDUM** SIGNED BY: Cain Saupe, M.D.   08/23/2012  *RADIOLOGY REPORT*  Clinical Data:  Right axillary masses.  The patient presents for biopsy of the largest, lower axillary mass.  ULTRASOUND GUIDED CORE BIOPSY OF THE  RIGHT AXILLA  Comparison: Previous exams.  I met with the patient and we discussed the procedure of ultrasound- guided biopsy, including benefits and alternatives.  We discussed the high  likelihood of a successful procedure. We discussed the risks of the procedure, including infection, bleeding, tissue injury, clip migration, and inadequate sampling.  Informed written consent was given.  Using sterile technique 2% lidocaine, ultrasound guidance and a 14 gauge automated biopsy device, biopsy was performed of the largest right axillary mass using a lateral approach.  IMPRESSION: Ultrasound guided biopsy of right axillary mass.  No apparent complications.  Original Report Authenticated By: Daryl Eastern, M.D.     EKG: 08/09/12: NSR, questionable RBBB, LAE, TWI V2  ASSESSMENT:   1. 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 Atypical chest pain, resolved 3. Hypothyroidism 4. Hyperlipidemia  DISCUSSION/PLAN:  Patient scheduled to undergo Port-A-Cath placement tomorrow. She has no  prior history of CAD. She has minimal cardiac risk factors in hyperlipidemia alone. No evidence of prior infarct on ECG or echo. Her chest discomfort, while described with some typical features, has been absent on exertion for the past 3-4 weeks. No DM, CKD, CHF or cerebrovascular disease. From a functional standpoint, she is able to achieve well beyond 4 METs. Port-A-Cath is low-risk procedure. Overall, she is deemed low-risk to undergo Port-A-Cath. No further ischemic work-up.    Signed, R. Hurman Horn, PA-C 09/18/2012, 4:29 PM    Patient seen and examined independently. Alinda Money Arguello's PA-C note reviewed carefully - agree with his assessment and plan. I have edited the note based on my findings.   Her chest pain was quite atypical and echo/ECG are normal. S has not had recurrent symptoms despite being quite active. Doubt CP was cardiac in nature. She is at low-risk for Port-A-Cath placement and can proceed without further cardiac work-up.   Tavin Vernet,MD 5:29 PM

## 2012-09-18 NOTE — Consult Note (Signed)
Anesthesia note: Patient is a 61 year old female posted for insertion of Port-A-Cath on 09/19/2012 by Dr. Jamey Ripa for new right breast cancer (likely metastatic). It is posted for for general anesthesia.  She reports that she may need additional breast surgery in ~ 3 months once her physicians know if she is responding to chemotherapy.  History includes nonsmoker, hypothyroidism, left breast fibroadenoma, right breast cancer status post partial mastectomy with sentinel node biopsy and chemoradiation therapy in 2002.  Oncologist is Dr. Donnie Coffin.  PCP is listed as Dr. [name] [facility].)  She saw Cardiologist Dr. Jens Som on 08/09/12 for evaluation of chest pain. EKG then showed SR with RV conduction delay.  Although he felt her chest pain was atypical, he recommended a stress echo--particularly in light of possible need for future breast surgery.  I do not see that it was done.  (Patient reports that she was told she would only need the stress echo if she had recurrent chest pain, which she denies.  This is not reflected in Dr. Ludwig Clarks office note, however.)  Echo on 05/12/12 showed: - Left ventricle: The cavity size was normal. Systolic function was normal. The estimated ejection fraction was in the range of 55% to 60%. Wall motion was normal; there were no regional wall motion abnormalities. - Tricuspid valve: Mild regurgitation.  - Pericardium, extracardiac: A trivial pericardial effusion was identified.  Carotid duplex on 05/12/12 showed no evidence of significant bilateral ICA stenosis. Vertebrals were patent with antegrade flow.  CBC, CMET noted from 09/18/12.  I reviewed above with Anesthesiologist Dr. Jean Rosenthal.  Will need to clarify with cardiology when and if stress echo should be done.  Dr. Jens Som is in the office today.  I spoke with his nurse Stanton Kidney who suggested patient be evaluated today at the hospital during her PAT appointment.  Arguello, PA-C and Dr.  Gala Romney evaluated Ms. Rana and ultimately felt okay to proceed with planned procedure.  No further ischemic work-up was recommended.  Shonna Chock, PA-C  09/18/12 1735

## 2012-09-18 NOTE — Pre-Procedure Instructions (Signed)
20 Debbie Larsen  09/18/2012   Your procedure is scheduled on:  November 20  Report to [facility] at 1:30 PM.  Call this number if you have problems the morning of surgery: [phone]   Remember:   Do not eat or drink:After Midnight.  Take these medicines the morning of surgery with A SIP OF WATER: Tylenol. Synthroid, Claritin, Ativan (if needed), Tramadol (if needed)   Do not wear jewelry, make-up or nail polish.  Do not wear lotions, powders, or perfumes. You may wear deodorant.  Do not shave 48 hours prior to surgery. Men may shave face and neck.  Do not bring valuables to the hospital.  Contacts, dentures or bridgework may not be worn into surgery.  Leave suitcase in the car. After surgery it may be brought to your room.  For patients admitted to the hospital, checkout time is 11:00 AM the day of discharge.   Patients discharged the day of surgery will not be allowed to drive home.  Name and phone number of your driver: [place]  Special Instructions: Shower using CHG 2 nights before surgery and the night before surgery.  If you shower the day of surgery use CHG.  Use special wash - you have one bottle of CHG for all showers.  You should use approximately 1/3 of the bottle for each shower.   Please read over the following fact sheets that you were given: Pain Booklet, Coughing and Deep Breathing and Surgical Site Infection Prevention

## 2012-09-19 ENCOUNTER — Encounter (HOSPITAL_COMMUNITY): Payer: Self-pay | Admitting: Vascular Surgery

## 2012-09-19 ENCOUNTER — Encounter (HOSPITAL_COMMUNITY)
Admission: RE | Disposition: A | Discharge status: Home / Self Care | Payer: Self-pay | Source: Ambulatory Visit | Attending: Surgery

## 2012-09-19 ENCOUNTER — Ambulatory Visit (HOSPITAL_COMMUNITY): Payer: Medicaid Other

## 2012-09-19 ENCOUNTER — Encounter (HOSPITAL_COMMUNITY): Payer: Self-pay | Admitting: *Deleted

## 2012-09-19 ENCOUNTER — Ambulatory Visit (HOSPITAL_COMMUNITY)
Admission: RE | Admit: 2012-09-19 | Discharge: 2012-09-19 | Disposition: A | Discharge status: Home / Self Care | Payer: Medicaid Other | Source: Ambulatory Visit | Attending: Surgery | Admitting: Surgery

## 2012-09-19 ENCOUNTER — Ambulatory Visit (HOSPITAL_COMMUNITY): Payer: Medicaid Other | Admitting: Vascular Surgery

## 2012-09-19 DIAGNOSIS — Z01810 Encounter for preprocedural cardiovascular examination: Secondary | ICD-10-CM | POA: Insufficient documentation

## 2012-09-19 DIAGNOSIS — Z79899 Other long term (current) drug therapy: Secondary | ICD-10-CM | POA: Insufficient documentation

## 2012-09-19 DIAGNOSIS — E039 Hypothyroidism, unspecified: Secondary | ICD-10-CM | POA: Insufficient documentation

## 2012-09-19 DIAGNOSIS — Z01812 Encounter for preprocedural laboratory examination: Secondary | ICD-10-CM | POA: Insufficient documentation

## 2012-09-19 DIAGNOSIS — C50919 Malignant neoplasm of unspecified site of unspecified female breast: Secondary | ICD-10-CM

## 2012-09-19 DIAGNOSIS — C773 Secondary and unspecified malignant neoplasm of axilla and upper limb lymph nodes: Secondary | ICD-10-CM | POA: Insufficient documentation

## 2012-09-19 DIAGNOSIS — Z853 Personal history of malignant neoplasm of breast: Secondary | ICD-10-CM | POA: Insufficient documentation

## 2012-09-19 DIAGNOSIS — Z8673 Personal history of transient ischemic attack (TIA), and cerebral infarction without residual deficits: Secondary | ICD-10-CM | POA: Insufficient documentation

## 2012-09-19 HISTORY — PX: PORTACATH PLACEMENT: SHX2246

## 2012-09-19 LAB — CANCER ANTIGEN 27.29: CA 27.29: 34 U/mL (ref 0–39)

## 2012-09-19 SURGERY — INSERTION, TUNNELED CENTRAL VENOUS DEVICE, WITH PORT
Anesthesia: General | Site: Chest | Wound class: Clean

## 2012-09-19 MED ORDER — BUPIVACAINE HCL (PF) 0.25 % IJ SOLN
INTRAMUSCULAR | Status: AC
  Filled 2012-09-19: qty 30

## 2012-09-19 MED ORDER — OXYCODONE HCL 5 MG/5ML PO SOLN: 5.0000 mg | Freq: Once | ORAL | Status: DC | PRN

## 2012-09-19 MED ORDER — FENTANYL CITRATE 0.05 MG/ML IJ SOLN
25.0000 ug | INTRAMUSCULAR | Status: DC | PRN
  Administered 2012-09-19: 25 ug via INTRAVENOUS
  Administered 2012-09-19: 18:00:00 via INTRAVENOUS

## 2012-09-19 MED ORDER — ONDANSETRON HCL 4 MG/2ML IJ SOLN
INTRAMUSCULAR | Status: DC | PRN
  Administered 2012-09-19: 4 mg via INTRAVENOUS

## 2012-09-19 MED ORDER — HEPARIN SOD (PORK) LOCK FLUSH 100 UNIT/ML IV SOLN
INTRAVENOUS | Status: DC | PRN
  Administered 2012-09-19: 500 [IU]

## 2012-09-19 MED ORDER — EPHEDRINE SULFATE 50 MG/ML IJ SOLN
INTRAMUSCULAR | Status: DC | PRN
  Administered 2012-09-19 (×2): 10 mg via INTRAVENOUS

## 2012-09-19 MED ORDER — PROMETHAZINE HCL 25 MG/ML IJ SOLN: 6.2500 mg | INTRAMUSCULAR | Status: DC | PRN

## 2012-09-19 MED ORDER — LIDOCAINE HCL (PF) 1 % IJ SOLN
INTRAMUSCULAR | Status: DC | PRN
  Administered 2012-09-19: 5 mL

## 2012-09-19 MED ORDER — CEFAZOLIN SODIUM-DEXTROSE 2-3 GM-% IV SOLR: 2.0000 g | INTRAVENOUS | Status: DC

## 2012-09-19 MED ORDER — SODIUM CHLORIDE 0.9 % IR SOLN
Status: DC | PRN
  Administered 2012-09-19: 17:00:00

## 2012-09-19 MED ORDER — MIDAZOLAM HCL 5 MG/5ML IJ SOLN
INTRAMUSCULAR | Status: DC | PRN
  Administered 2012-09-19: 2 mg via INTRAVENOUS

## 2012-09-19 MED ORDER — HEPARIN SOD (PORK) LOCK FLUSH 100 UNIT/ML IV SOLN
INTRAVENOUS | Status: AC
  Filled 2012-09-19: qty 5

## 2012-09-19 MED ORDER — ONDANSETRON HCL 4 MG/2ML IJ SOLN: 4.0000 mg | Freq: Once | INTRAMUSCULAR | Status: DC | PRN

## 2012-09-19 MED ORDER — BUPIVACAINE HCL (PF) 0.25 % IJ SOLN
INTRAMUSCULAR | Status: DC | PRN
  Administered 2012-09-19: 10 mL

## 2012-09-19 MED ORDER — MIDAZOLAM HCL 2 MG/2ML IJ SOLN: 1.0000 mg | INTRAMUSCULAR | Status: DC | PRN

## 2012-09-19 MED ORDER — OXYCODONE HCL 5 MG PO TABS: 5.0000 mg | ORAL_TABLET | Freq: Once | ORAL | Status: DC | PRN

## 2012-09-19 MED ORDER — LACTATED RINGERS IV SOLN
INTRAVENOUS | Status: DC | PRN
  Administered 2012-09-19 (×2): via INTRAVENOUS

## 2012-09-19 MED ORDER — LACTATED RINGERS IV SOLN
INTRAVENOUS | Status: DC
  Administered 2012-09-19: 14:00:00 via INTRAVENOUS

## 2012-09-19 MED ORDER — LIDOCAINE HCL (PF) 1 % IJ SOLN
INTRAMUSCULAR | Status: AC
  Filled 2012-09-19: qty 30

## 2012-09-19 MED ORDER — FENTANYL CITRATE 0.05 MG/ML IJ SOLN
INTRAMUSCULAR | Status: AC
  Filled 2012-09-19: qty 2

## 2012-09-19 MED ORDER — CHLORHEXIDINE GLUCONATE 4 % EX LIQD: 1.0000 "application " | Freq: Once | CUTANEOUS | Status: DC

## 2012-09-19 MED ORDER — FENTANYL CITRATE 0.05 MG/ML IJ SOLN
INTRAMUSCULAR | Status: DC | PRN
  Administered 2012-09-19: 25 ug via INTRAVENOUS

## 2012-09-19 MED ORDER — CHLORHEXIDINE GLUCONATE 4 % EX LIQD
1.0000 | Freq: Once | CUTANEOUS | Status: DC
Start: 2012-09-20 — End: 2012-09-19

## 2012-09-19 MED ORDER — PROPOFOL 10 MG/ML IV BOLUS
INTRAVENOUS | Status: DC | PRN
  Administered 2012-09-19: 140 mg via INTRAVENOUS

## 2012-09-19 MED ORDER — PHENYLEPHRINE HCL 10 MG/ML IJ SOLN
INTRAMUSCULAR | Status: DC | PRN
  Administered 2012-09-19 (×2): 80 ug via INTRAVENOUS

## 2012-09-19 MED ORDER — LIDOCAINE HCL (CARDIAC) 20 MG/ML IV SOLN
INTRAVENOUS | Status: DC | PRN
  Administered 2012-09-19: 80 mg via INTRAVENOUS

## 2012-09-19 MED ORDER — FENTANYL CITRATE 0.05 MG/ML IJ SOLN: 50.0000 ug | Freq: Once | INTRAMUSCULAR | Status: DC

## 2012-09-19 MED ORDER — HYDROMORPHONE HCL PF 1 MG/ML IJ SOLN: 0.2500 mg | INTRAMUSCULAR | Status: DC | PRN

## 2012-09-19 SURGICAL SUPPLY — 49 items
ADH SKN CLS APL DERMABOND .7 (GAUZE/BANDAGES/DRESSINGS) ×1
BAG DECANTER FOR FLEXI CONT (MISCELLANEOUS) ×2 IMPLANT
BLADE SURG 15 STRL LF DISP TIS (BLADE) ×1 IMPLANT
BLADE SURG 15 STRL SS (BLADE) ×2
CANISTER SUCTION 2500CC (MISCELLANEOUS) ×1 IMPLANT
CHLORAPREP W/TINT 10.5 ML (MISCELLANEOUS) ×2 IMPLANT
CLOTH BEACON ORANGE TIMEOUT ST (SAFETY) ×2 IMPLANT
COVER SURGICAL LIGHT HANDLE (MISCELLANEOUS) ×2 IMPLANT
CRADLE DONUT ADULT HEAD (MISCELLANEOUS) ×2 IMPLANT
DECANTER SPIKE VIAL GLASS SM (MISCELLANEOUS) ×4 IMPLANT
DERMABOND ADVANCED (GAUZE/BANDAGES/DRESSINGS) ×1
DERMABOND ADVANCED .7 DNX12 (GAUZE/BANDAGES/DRESSINGS) ×1 IMPLANT
DRAPE C-ARM 42X72 X-RAY (DRAPES) ×2 IMPLANT
DRAPE LAPAROTOMY TRNSV 102X78 (DRAPE) ×2 IMPLANT
DRAPE UTILITY 15X26 W/TAPE STR (DRAPE) ×4 IMPLANT
ELECT CAUTERY BLADE 6.4 (BLADE) ×2 IMPLANT
ELECT REM PT RETURN 9FT ADLT (ELECTROSURGICAL) ×2
ELECTRODE REM PT RTRN 9FT ADLT (ELECTROSURGICAL) ×1 IMPLANT
GAUZE SPONGE 4X4 16PLY XRAY LF (GAUZE/BANDAGES/DRESSINGS) ×2 IMPLANT
GLOVE EUDERMIC 7 POWDERFREE (GLOVE) ×2 IMPLANT
GOWN PREVENTION PLUS XLARGE (GOWN DISPOSABLE) ×2 IMPLANT
GOWN STRL NON-REIN LRG LVL3 (GOWN DISPOSABLE) ×2 IMPLANT
INTRODUCER 13FR (MISCELLANEOUS) IMPLANT
INTRODUCER COOK 11FR (CATHETERS) IMPLANT
KIT BASIN OR (CUSTOM PROCEDURE TRAY) ×2 IMPLANT
KIT PORT POWER 9.6FR MRI PREA (Catheter) IMPLANT
KIT PORT POWER ISP 8FR (Catheter) IMPLANT
KIT POWER CATH 8FR (Catheter) ×1 IMPLANT
KIT ROOM TURNOVER OR (KITS) ×2 IMPLANT
NDL HYPO 25GX1X1/2 BEV (NEEDLE) ×1 IMPLANT
NEEDLE HYPO 25GX1X1/2 BEV (NEEDLE) ×2 IMPLANT
NS IRRIG 1000ML POUR BTL (IV SOLUTION) ×2 IMPLANT
PACK SURGICAL SETUP 50X90 (CUSTOM PROCEDURE TRAY) ×2 IMPLANT
PAD ARMBOARD 7.5X6 YLW CONV (MISCELLANEOUS) ×4 IMPLANT
PENCIL BUTTON HOLSTER BLD 10FT (ELECTRODE) ×2 IMPLANT
SET INTRODUCER 12FR PACEMAKER (SHEATH) IMPLANT
SET SHEATH INTRODUCER 10FR (MISCELLANEOUS) IMPLANT
SHEATH COOK PEEL AWAY SET 9F (SHEATH) IMPLANT
SUT MNCRL AB 4-0 PS2 18 (SUTURE) ×2 IMPLANT
SUT PROLENE 2 0 SH DA (SUTURE) ×2 IMPLANT
SUT VIC AB 3-0 SH 18 (SUTURE) ×2 IMPLANT
SYR 20ML ECCENTRIC (SYRINGE) ×4 IMPLANT
SYR 5ML LUER SLIP (SYRINGE) ×2 IMPLANT
SYR CONTROL 10ML LL (SYRINGE) ×2 IMPLANT
TOWEL OR 17X24 6PK STRL BLUE (TOWEL DISPOSABLE) ×2 IMPLANT
TOWEL OR 17X26 10 PK STRL BLUE (TOWEL DISPOSABLE) ×2 IMPLANT
TUBE CONNECTING 12X1/4 (SUCTIONS) ×2 IMPLANT
WATER STERILE IRR 1000ML POUR (IV SOLUTION) IMPLANT
YANKAUER SUCT BULB TIP NO VENT (SUCTIONS) ×2 IMPLANT

## 2012-09-19 NOTE — Anesthesia Procedure Notes (Addendum)
Procedure Name: LMA Insertion Date/Time: 09/19/2012 3:47 PM Performed by: Jerilee Hoh Pre-anesthesia Checklist: Patient identified, Emergency Drugs available, Suction available and Patient being monitored Patient Re-evaluated:Patient Re-evaluated prior to inductionOxygen Delivery Method: Circle system utilized Preoxygenation: Pre-oxygenation with 100% oxygen Intubation Type: IV induction LMA: LMA inserted LMA Size: 3.0 Tube type: Oral Number of attempts: 1 Placement Confirmation: positive ETCO2 and breath sounds checked- equal and bilateral Tube secured with: Tape Dental Injury: Teeth and Oropharynx as per pre-operative assessment

## 2012-09-19 NOTE — Interval H&P Note (Signed)
History and Physical Interval Note:  09/19/2012 2:57 PM  Debbie Larsen Debbie Larsen  has presented today for surgery, with the diagnosis of recurrent right breast cancer  The various methods of treatment have been discussed with the patient and family. After consideration of risks, benefits and other options for treatment, the patient has consented to  Procedure(s) (LRB) with comments: INSERTION PORT-A-CATH (N/A) as a surgical intervention .  The patient's history has been reviewed, patient examined, no change in status, stable for surgery.  I have reviewed the patient's chart and labs.  Questions were answered to the patient's satisfaction.     Debbie Larsen

## 2012-09-19 NOTE — Preoperative (Signed)
Beta Blockers   Reason not to administer Beta Blockers:Not Applicable 

## 2012-09-19 NOTE — Anesthesia Postprocedure Evaluation (Signed)
  Anesthesia Post-op Note  Patient: Debbie Larsen  Procedure(s) Performed: Procedure(s) (LRB) with comments: INSERTION PORT-A-CATH (N/A)  Patient Location: PACU  Anesthesia Type:General  Level of Consciousness: awake  Airway and Oxygen Therapy: Patient Spontanous Breathing  Post-op Pain: mild  Post-op Assessment: Post-op Vital signs reviewed, Patient's Cardiovascular Status Stable, Respiratory Function Stable, Patent Airway, No signs of Nausea or vomiting and Pain level controlled  Post-op Vital Signs: stable  Complications: No apparent anesthesia complications

## 2012-09-19 NOTE — Anesthesia Preprocedure Evaluation (Signed)
Anesthesia Evaluation  Patient identified by MRN, date of birth, ID band Patient awake    Reviewed: Allergy & Precautions, H&P , NPO status , Patient's Chart, lab work & pertinent test results  Airway Mallampati: I TM Distance: >3 FB Neck ROM: Full    Dental   Pulmonary  breath sounds clear to auscultation        Cardiovascular Rhythm:Regular Rate:Normal     Neuro/Psych CVA    GI/Hepatic   Endo/Other  Hypothyroidism   Renal/GU      Musculoskeletal   Abdominal   Peds  Hematology   Anesthesia Other Findings   Reproductive/Obstetrics Breast ca                           Anesthesia Physical Anesthesia Plan  ASA: II  Anesthesia Plan: General   Post-op Pain Management:    Induction: Intravenous  Airway Management Planned: LMA  Additional Equipment:   Intra-op Plan:   Post-operative Plan: Extubation in OR  Informed Consent: I have reviewed the patients History and Physical, chart, labs and discussed the procedure including the risks, benefits and alternatives for the proposed anesthesia with the patient or authorized representative who has indicated his/her understanding and acceptance.     Plan Discussed with: CRNA and Surgeon  Anesthesia Plan Comments:         Anesthesia Quick Evaluation

## 2012-09-19 NOTE — Progress Notes (Signed)
Late entry: During PAT visit Dr. Jamey Ripa called back regarding consent order. Per Dr. Jamey Ripa use consent order that contains Porta cath placement only and discontinue other consent order.

## 2012-09-19 NOTE — Op Note (Signed)
Debbie Larsen [facility] [birth year] [id number] 09/14/2012  Preoperative diagnosis: PAC needed  Postoperative diagnosis: Same  Procedure: Portacath Placement  Surgeon: [name], MD, FACS  Anesthesia: General  Clinical History and Indications: The patient is getting ready to begin chemotherapy for her cancer. She  needs a Port-A-Cath for venous access.  Description of Procedure: I have seen the patient in the holding area and confirmed the plans for the procedure as noted above. I reviewed the risks and complications again and the patient has no further questions. She wishes to proceed.   The patient was then taken to the operating room. After satisfactory general anesthesia had been obtained the upper chest and lower neck were prepped and draped as a sterile field. The timeout was done.  The left subclavian vein was entered and the guidewire threaded into the superior vena cava right atrial area under fluoroscopic guidance. An incision was then made on the anterior chest wall and a subcutaneous pocket fashioned for the port reservoir.  The port tubing was then brought through a subcutaneous tunnel from the port site to the guidewire site. The dilator and peel-away sheath were then advanced over the guidewire while monitoring this with fluoroscopy. The guidewire and dilator were removed and the tubing threaded to approximately 22 cm. The peel-away sheath was then removed. The catheter aspirated and flushed easily. Using fluoroscopy the tip was backed out into the superior vena cava right atrial junction area. It aspirated and flushed easily. The reservoir was attached and the locking mechanism engaged. That aspirated and flushed easily.  The reservoir was secured to the fascia with 2 sutures of 2-0 Prolene. A final check with fluoroscopy was done to make sure we had no kinks and good positioning of the tip of the catheter. Everything appeared to be okay. The catheter was aspirated, flushed  with dilute heparin.  The incision was then closed with interrupted 3-0 Vicryl, and 4-0 Monocryl subcuticular with Dermabond on the skin.The port was accessed, aspirated, flushed with dilute then concentrated heparin  There were no operative complications. Estimated blood loss was minimal. All counts were correct. The patient tolerated the procedure well.  [name], MD, FACS 09/19/2012 4:38 PM

## 2012-09-19 NOTE — H&P (View-Only) (Signed)
NAMEAKESHA URESTI [facility] DOB: 27-Jan-1951 MRN: [id number]                                                                                      DATE: 09/04/2012  PCP: [name], MD Referring Provider: [name], MD  IMPRESSION:  Axillary recurrence of a right breast cancer, Possible liver metastasis  PLAN:   I think she will need an axillary dissection although I am concerned about the fact there does seem to be matted. She also needs metastatic workup and I think is going to need postoperative chemotherapy. I reviewed the possible need for a Port-A-Cath. She had her prior cancer she did not need a port. I told she may have metastatic disease. I told her that she'll be presented at the breast cancer conference tomorrow and will make further recommendations about workup et Karie Soda.                 CC:  Chief Complaint  Patient presents with  . Breast Cancer    Axillary node, left    HPI:  Inice Kayzlee Wirtanen is a 61 y.o.  female who presents for evaluation of a newly diagnosed recurrence in her right axilla of her breast cancer. On July 17/2002 she underwent a right sentinel lymph node evaluation and partial mastectomy with the low for a right breast cancer. This was receptor positive at 95% and 96% with a Ki-67 of 11%. Ferritin was 1%, negative. 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 One lymph node was positive for metastatic carcinoma. One lymph node was negative for metastatic carcinoma. She has subsequent radiation therapy, 6 megavolt O. Times tangential fields to the right breast and left anterior oblique field to the right supraclavicular/axillary region. The dose was prescribed at 3 cm depth. 15 megavolt electron, right breast boost. She had a total of 5040 and 28 sessions with a 1200 boost in succession.  In August of this year she felt something in the right armpit and this is gotten larger, harder  and has become painful. She was seen in the breast clinic for evaluation and a mammogram ultrasound done of the hard mass was found. This was biopsied and is invasive ductal carcinoma, ER is negative at 0% PR negative at 0% and a Ki-67 at 59%. HER-2/neu was negative. An MRI was done confirming multiple axillary lymph nodes. There is also questionable lesion in the liver.  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  PSH:   has past surgical history that includes Tonsillectomy; Breast lumpectomy (04/2001); and Breast lumpectomy (05/16/2001).  ALLERGIES:  No Known Allergies  MEDICATIONS: Current outpatient prescriptions:levothyroxine (SYNTHROID, LEVOTHROID) 75 MCG tablet, Take 1 tablet (75 mcg total) by mouth daily. (dosage change), Disp: 30 tablet, Rfl: 1;  acetaminophen (TYLENOL) 500 MG tablet, Take 500 mg by mouth every 4 (four) hours as needed. For pain., Disp: , Rfl: ;  aspirin 325 MG tablet, Take 1 tablet (325 mg total) by mouth daily., Disp: 30 tablet, Rfl: 0 cholecalciferol  2000 UNITS tablet, Take 1 tablet (2,000 Units total) by mouth daily., Disp: , Rfl: ;  loratadine (CLARITIN) 10 MG tablet, Take 1 tablet (10 mg total) by mouth daily., Disp: 30 tablet, Rfl: 11;  traMADol (ULTRAM) 50 MG tablet, Take 1 tablet (50 mg total) by mouth every 8 (eight) hours as needed for pain., Disp: 30 tablet, Rfl: 1  ROS: She has filled out our 12 point review of systems and it is negative . EXAM:   VITAL SIGNS: BP 112/68  Pulse 74  Temp 97.2 F (36.2 C) (Temporal)  Resp 16  Ht 5' (1.524 m)  Wt 127 lb 2 oz (57.664 kg)  BMI 24.83 kg/m2 GENERAL:  The patient is alert, oriented, and generally healthy-appearing, NAD. Mood and affect are normal.  HEENT:  The head is normocephalic, the eyes nonicteric, the pupils were round regular and equal. EOMs are normal. Pharynx normal. Dentition good.  NECK:  The neck is  supple and there are no masses or thyromegaly.  LUNGS: Normal respirations and clear to auscultation.  HEART: Regular rhythm, with no murmurs rubs or gallops. Pulses are intact carotid dorsalis pedis and posterior tibial. No significant varicosities are noted.  BREASTS:  breasts status post lumpectomy on the right side no evidence of a breast lesion. The left breast is unremarkable. LYMPHATICS: There are matted right axillary lymph nodes which are slightly tender. There do not appear to be any supraclavicular nodes on either side and no left axillary nodes.  ABDOMEN: Soft, flat, and nontender. No masses or organomegaly is noted. No hernias are noted. Bowel sounds are normal.  EXTREMITIES:  Good range of motion, no edema.   DATA REVIEWED:  I have reviewed pathology reports MRI and mammogram reports    Josalyn Dettmann J 09/04/2012  CC: [name], MD, [name], MD

## 2012-09-19 NOTE — Transfer of Care (Signed)
Immediate Anesthesia Transfer of Care Note  Patient: Debbie Larsen  Procedure(s) Performed: Procedure(s) (LRB) with comments: INSERTION PORT-A-CATH (N/A)  Patient Location: PACU  Anesthesia Type:General  Level of Consciousness: awake, alert , oriented and patient cooperative  Airway & Oxygen Therapy: Patient Spontanous Breathing and Patient connected to nasal cannula oxygen  Post-op Assessment: Report given to PACU RN, Post -op Vital signs reviewed and stable and Patient moving all extremities  Post vital signs: Reviewed and stable  Complications: No apparent anesthesia complications

## 2012-09-20 ENCOUNTER — Telehealth: Payer: Self-pay | Admitting: *Deleted

## 2012-09-20 ENCOUNTER — Ambulatory Visit (HOSPITAL_BASED_OUTPATIENT_CLINIC_OR_DEPARTMENT_OTHER): Payer: Self-pay

## 2012-09-20 ENCOUNTER — Encounter (HOSPITAL_COMMUNITY): Payer: Self-pay | Admitting: Surgery

## 2012-09-20 VITALS — BP 125/71 | HR 94 | Temp 98.1°F | Resp 20

## 2012-09-20 DIAGNOSIS — C50919 Malignant neoplasm of unspecified site of unspecified female breast: Secondary | ICD-10-CM

## 2012-09-20 DIAGNOSIS — Z5111 Encounter for antineoplastic chemotherapy: Secondary | ICD-10-CM

## 2012-09-20 DIAGNOSIS — C778 Secondary and unspecified malignant neoplasm of lymph nodes of multiple regions: Secondary | ICD-10-CM

## 2012-09-20 MED ORDER — DEXAMETHASONE SODIUM PHOSPHATE 10 MG/ML IJ SOLN
10.0000 mg | Freq: Once | INTRAMUSCULAR | Status: AC
  Administered 2012-09-20: 10 mg via INTRAVENOUS

## 2012-09-20 MED ORDER — ONDANSETRON 8 MG/50ML IVPB (CHCC)
8.0000 mg | Freq: Once | INTRAVENOUS | Status: AC
  Administered 2012-09-20: 8 mg via INTRAVENOUS

## 2012-09-20 MED ORDER — SODIUM CHLORIDE 0.9 % IJ SOLN
10.0000 mL | INTRAMUSCULAR | Status: DC | PRN
  Administered 2012-09-20: 10 mL
  Filled 2012-09-20: qty 10

## 2012-09-20 MED ORDER — PACLITAXEL PROTEIN-BOUND CHEMO INJECTION 100 MG
100.0000 mg/m2 | Freq: Once | INTRAVENOUS | Status: AC
  Administered 2012-09-20: 150 mg via INTRAVENOUS
  Filled 2012-09-20: qty 30

## 2012-09-20 MED ORDER — SODIUM CHLORIDE 0.9 % IV SOLN
Freq: Once | INTRAVENOUS | Status: AC
  Administered 2012-09-20: 10:00:00 via INTRAVENOUS

## 2012-09-20 MED ORDER — HEPARIN SOD (PORK) LOCK FLUSH 100 UNIT/ML IV SOLN
500.0000 [IU] | Freq: Once | INTRAVENOUS | Status: AC | PRN
  Administered 2012-09-20: 500 [IU]
  Filled 2012-09-20: qty 5

## 2012-09-20 NOTE — Telephone Encounter (Signed)
Per staff message and POF I have scheduled appts.  JMW  

## 2012-09-20 NOTE — Patient Instructions (Addendum)
[facility] Discharge Instructions for Patients Receiving Chemotherapy  Today you received the following chemotherapy agents Abraxane To help prevent nausea and vomiting after your treatment, we encourage you to take your nausea medication as prescribed. If you develop nausea and vomiting that is not controlled by your nausea medication, call the clinic. If it is after clinic hours your family physician or the after hours number for the clinic or go to the Emergency Department.   BELOW ARE SYMPTOMS THAT SHOULD BE REPORTED IMMEDIATELY:  *FEVER GREATER THAN 100.5 F  *CHILLS WITH OR WITHOUT FEVER  NAUSEA AND VOMITING THAT IS NOT CONTROLLED WITH YOUR NAUSEA MEDICATION  *UNUSUAL SHORTNESS OF BREATH  *UNUSUAL BRUISING OR BLEEDING  TENDERNESS IN MOUTH AND THROAT WITH OR WITHOUT PRESENCE OF ULCERS  *URINARY PROBLEMS  *BOWEL PROBLEMS  UNUSUAL RASH Items with * indicate a potential emergency and should be followed up as soon as possible.  One of the nurses will contact you 24 hours after your treatment. Please let the nurse know about any problems that you may have experienced. Feel free to call the clinic you have any questions or concerns. The clinic phone number is [phone].   I have been informed and understand all the instructions given to me. I know to contact the clinic, my physician, or go to the Emergency Department if any problems should occur. I do not have any questions at this time, but understand that I may call the clinic during office hours   should I have any questions or need assistance in obtaining follow up care.    __________________________________________  _____________  __________ Signature of Patient or Authorized Representative            Date                   Time    __________________________________________ Nurse's Signature    

## 2012-09-21 ENCOUNTER — Telehealth: Payer: Self-pay | Admitting: *Deleted

## 2012-09-21 NOTE — Telephone Encounter (Signed)
PT. IS EATING AND FORCING FLUIDS. NO NAUSEA OR VOMITING. NO DIARRHEA. LAST BOWEL MOVEMENT ON 09/20/12, PT. STATES SHE FEELS A LITTLE CONSTIPATED. SHE HAS MILK OF MAGNESIUM. ALSO SUGGESTED PT. USES SENNA DAILY. NO PROBLEMS WITH HER MOUTH. NO FEVER. PT. STATES HER LEGS FELT HEAVY YESTERDAY. SHE DOES NOT HAVE ANY PAIN,SWELLING, OR DIFFICULTY WALKING TODAY. INFORMED PT. IF SHE SHOULD HAVE ANY PROBLEMS WHEN THE OFFICE IS CLOSED TO CALL THE OFFICE NUMBER AND THE ANSWERING SERVICE WILL GET THE ON CALL PHYSICIAN TO CALL HER. PT, VOICES UNDERSTANDING.

## 2012-09-24 ENCOUNTER — Telehealth: Payer: Self-pay | Admitting: *Deleted

## 2012-09-24 NOTE — Telephone Encounter (Signed)
Per staff message I have moved chemo from 11/27 to 12/2

## 2012-09-26 ENCOUNTER — Ambulatory Visit: Payer: Self-pay

## 2012-09-26 ENCOUNTER — Other Ambulatory Visit (HOSPITAL_BASED_OUTPATIENT_CLINIC_OR_DEPARTMENT_OTHER): Payer: Self-pay | Admitting: Lab

## 2012-09-26 ENCOUNTER — Telehealth: Payer: Self-pay | Admitting: *Deleted

## 2012-09-26 ENCOUNTER — Ambulatory Visit (HOSPITAL_BASED_OUTPATIENT_CLINIC_OR_DEPARTMENT_OTHER): Payer: Self-pay | Admitting: Oncology

## 2012-09-26 VITALS — BP 124/81 | HR 85 | Temp 98.8°F | Resp 20 | Ht 60.0 in | Wt 127.2 lb

## 2012-09-26 DIAGNOSIS — C50919 Malignant neoplasm of unspecified site of unspecified female breast: Secondary | ICD-10-CM

## 2012-09-26 DIAGNOSIS — Z171 Estrogen receptor negative status [ER-]: Secondary | ICD-10-CM

## 2012-09-26 DIAGNOSIS — C773 Secondary and unspecified malignant neoplasm of axilla and upper limb lymph nodes: Secondary | ICD-10-CM

## 2012-09-26 LAB — COMPREHENSIVE METABOLIC PANEL (CC13)
ALT: 17 U/L (ref 0–55)
Albumin: 4 g/dL (ref 3.5–5.0)
Alkaline Phosphatase: 82 U/L (ref 40–150)
CO2: 26 mEq/L (ref 22–29)
Chloride: 102 mEq/L (ref 98–107)
Glucose: 121 mg/dl — ABNORMAL HIGH (ref 70–99)
Total Bilirubin: 0.35 mg/dL (ref 0.20–1.20)
Total Protein: 7.7 g/dL (ref 6.4–8.3)

## 2012-09-26 LAB — CBC WITH DIFFERENTIAL/PLATELET
Eosinophils Absolute: 0 10*3/uL (ref 0.0–0.5)
LYMPH%: 36.4 % (ref 14.0–49.7)
MCV: 86.3 fL (ref 79.5–101.0)
MONO%: 3.5 % (ref 0.0–14.0)
NEUT#: 3.3 10*3/uL (ref 1.5–6.5)
NEUT%: 58.6 % (ref 38.4–76.8)
Platelets: 239 10*3/uL (ref 145–400)
RBC: 4.2 10*6/uL (ref 3.70–5.45)

## 2012-09-26 NOTE — Telephone Encounter (Signed)
Made patient appointment for 01-16-201 thru 12-20-2012  [facility] email to set up patient for treatment

## 2012-09-26 NOTE — Telephone Encounter (Signed)
Per staff message I have scheduled appts. JMW  

## 2012-09-26 NOTE — Progress Notes (Signed)
Hematology and Oncology Follow Up Visit  Debbie Larsen [id number] [birth year] 60 y.o. 09/26/2012 2:07 PM   DIAGNOSIS:   Recurrent breast cancer in rt axilla, triple-negative.  PAST THERAPY:  CMF x 6 cycles,   Interim History:  Cycle 1 abraxane.  Medications: I have reviewed the patient's current medications.  Allergies: No Known Allergies  Past Medical History, Surgical history, Social history, and Family History were reviewed and updated.  Review of Systems: Constitutional:  Negative for fever, chills, night sweats, anorexia, weight loss, pain. Cardiovascular: no chest pain or dyspnea on exertion Respiratory: no cough, shortness of breath, or wheezing Neurological: no TIA or stroke symptoms Dermatological: negative ENT: negative Skin Gastrointestinal: negative Genito-Urinary: negative Hematological and Lymphatic: negative Breast: negative Musculoskeletal: negative Remaining ROS negative.  Physical Exam:  Blood pressure 124/81, pulse 85, temperature 98.8 F (37.1 C), temperature source Oral, resp. rate 20, height 5' (1.524 m), weight 127 lb 3.2 oz (57.698 kg).  ECOG: 1  HEENT:  Sclerae anicteric, conjunctivae pink.  Oropharynx clear.  No mucositis or candidiasis.  Nodes:  No cervical, supraclavicular, or axillary lymphadenopathy palpated.  Breast Exam:  Right breast is benign.  No masses, discharge, skin change, or nipple inversion.  Left breast is benign.  No masses, discharge, skin change, or nipple inversion. Rt axillary mass, fixed..  Lungs:  Clear to auscultation bilaterally.  No crackles, rhonchi, or wheezes.  Heart:  Regular rate and rhythm.  Abdomen:  Soft, nontender.  Positive bowel sounds.  No organomegaly or masses palpated.  Musculoskeletal:  No focal spinal tenderness to palpation.  Extremities:  Benign.  No peripheral edema or cyanosis.  Skin:  Benign.  Neuro:  Nonfocal.     Lab Results: Lab Results  Component Value Date   WBC 5.6 09/26/2012   HGB  12.3 09/26/2012   HCT 36.3 09/26/2012   MCV 86.3 09/26/2012   PLT 239 09/26/2012     Chemistry      Component Value Date/Time   NA 138 09/18/2012 1531   K 3.7 09/18/2012 1531   CL 103 09/18/2012 1531   CO2 23 09/18/2012 1531   BUN 9 09/18/2012 1531   CREATININE 0.70 09/18/2012 1531   CREATININE 0.74 03/01/2012 1715      Component Value Date/Time   CALCIUM 9.8 09/18/2012 1531   ALKPHOS 81 09/18/2012 1531   AST 18 09/18/2012 1531   ALT 13 09/18/2012 1531   BILITOT 0.3 09/18/2012 1531       Radiological Studies:  Mr Breast Bilateral W Wo Contrast  08/31/2012  *RADIOLOGY REPORT*  Clinical Data: right lumpectomy in 2003 with recent ultrasound- guided core biopsy demonstrating metastatic right axillary adenopathy  BILATERAL BREAST MRI WITH AND WITHOUT CONTRAST  Technique: Multiplanar, multisequence MR images of both breasts were obtained prior to and following the intravenous administration of 11ml of multihance.  Three dimensional images were evaluated at the independent DynaCad workstation.  Comparison:  prior studies  Findings: There is minimal background parenchymal enhancement. Neither breast demosntrates evidence of mass or suspicious enhancment.  There are normal appearing left axillary lymph nodes. On the right, in the high axilla, there is a 12 x 10mm lymph node. Immediately inferior to this is a 27 x 22mm lymph node demonstrating rim enhancement.  An 8mm lymph node is immediately anterolateral to this, and there is a 1cm lymph node immediately posteromedial to the dominant lymph node.  None of the lymph nodes demonstrate normal morphology or normal fatty hila.  In the right lobe  of the liver, there is a T2-hyperintense, enhancing mass measuring about 1.5cm, showing progressive enhancement throughout the study, and minimal early enhancement.  IMPRESSION: 1.  Abnormal right axillary lymph nodes consistent with metastasis.  2.   No suspicious findings within either breast.  3.   Enhancing  mass right lobe of liver.  While hepatic metastasis is not excluded, this may represent a hemangioma.  Recommend further imaging evaluation of this finding.  Liver protocol CT or MRI, or PET/CT could be considered to further evaluate.  BI-RADS CATEGORY 6:  Known biopsy-proven malignancy - appropriate action should be taken.   THREE-DIMENSIONAL MR IMAGE RENDERING ON INDEPENDENT WORKSTATION:  Three-dimensional MR images were rendered by post-processing of the original MR data on an independent workstation.  The three- dimensional MR images were interpreted, and findings were reported in the accompanying complete MRI report for this study.   Original Report Authenticated By: Esperanza Heir, M.D.    Nm Pet Image Initial (pi) Skull Base To Thigh  09/12/2012  *RADIOLOGY REPORT*  Clinical Data: Subsequent treatment strategy for breast cancer. History right breast cancer in 2002  NUCLEAR MEDICINE PET SKULL BASE TO THIGH  Fasting Blood Glucose:  126  Technique:  17.5 mCi F-18 FDG was injected intravenously.   CT data was obtained and used for attenuation correction and anatomic localization only.  (This was not acquired as a diagnostic CT examination.) Additional exam technical data entered  on technologist worksheet.  Comparison: None  Findings:  Neck: No hypermetabolic nodes in the neck.  There is misregistration between the CT data set and the PET data set in the head neck.  No hypermetabolic neck lymph nodes are noted.  Of the  Chest: There is hypermetabolic mass in the right axilla measuring 18 mm (image 55) with intense metabolic activity ( SUV max = 9.7. Several smaller hypermetabolic axillary nodes more superiorly are evident on the PET data set image 42 and 47.  In the mediastinum, there is hypermetabolic paratracheal lymph node on the right measuring 18 mm short axis with intense metabolic activity ( SUV max = 16.1).  Hypermetabolic subcarinal and left hilar lymph node.  Hypermetabolic prevascular lymph node  measures 10 mm with SUV max = 8.3.  Review of the lung parenchyma demonstrates several hypermetabolic nodules within the left lung.  These include 7 mm nodule (image 71) in the left upper lobe and an 8 mm nodule within the left lower lobe. These have mild metabolic activity ( S U V max 3.0)  and concerning for metastasis.   There is a fibrotic pattern in the lateral right upper lobe which likely represents radiation change.  Abdomen / Pelvis:There is no discrete abnormal hypermetabolic activity within the right hepatic lobe correspond to the MRI findings.  No hypermetabolic abdominal or pelvic lymph nodes.  Skeleton:No focal hypermetabolic activity to suggest skeletal metastasis.  IMPRESSION:  1.  Hypermetabolic right axillary lymph nodes consistent with local recurrence  of breast cancer. 2.  Hypermetabolic ipsilateral and contralateral mediastinal nodal metastasis. 3.  Hypermetabolic nodules within the left lung most concerning for pulmonary metastasis. 4. No PET CT evidence of hepatic metastasis.   Original Report Authenticated By: Genevive Bi, M.D.    Dg Chest Port 1 View  09/19/2012  *RADIOLOGY REPORT*  Clinical Data: Left-sided Port-A-Cath placement.  PORTABLE CHEST - 1 VIEW  Comparison: PET CT from 09/12/2012  Findings: 1713 hours.  Left-sided Port-A-Cath tip projects at the distal SVC level, near the junction of the SVC and RA.  No evidence for a kink in the catheter tubing.  No evidence for left-sided pneumothorax. The cardiopericardial silhouette is enlarged. Interstitial markings are diffusely coarsened with chronic features.  Multiple bilateral pulmonary nodules seen on the PET CT are not readily evident on this portable chest x-ray.  IMPRESSION: No evidence for left-sided pneumothorax status post Port-A-Cath placement.   Original Report Authenticated By: Kennith Center, M.D.    Dg Fluoro Guide Cv Line-no Report  09/19/2012  CLINICAL DATA: surgery   FLOURO GUIDE CV LINE  Fluoroscopy was utilized  by the requesting physician.  No radiographic  interpretation.       IMPRESSIONS AND PLAN: A 61 y.o. female with   Recurrent breast cancer, Triple engative on abraxane based chemotherapy. Due for week 2 cycle 1 on 10/01/12.  Spent more than half the time coordinating care, as well as discussion of BMI and its implications.      Debbie Larsen 11/27/20132:07 PM Cell [id number]

## 2012-09-27 LAB — CANCER ANTIGEN 27.29: CA 27.29: 34 U/mL (ref 0–39)

## 2012-10-01 ENCOUNTER — Encounter (HOSPITAL_BASED_OUTPATIENT_CLINIC_OR_DEPARTMENT_OTHER): Payer: Self-pay

## 2012-10-01 ENCOUNTER — Ambulatory Visit (HOSPITAL_BASED_OUTPATIENT_CLINIC_OR_DEPARTMENT_OTHER): Admit: 2012-10-01 | Payer: Self-pay | Admitting: Surgery

## 2012-10-01 ENCOUNTER — Ambulatory Visit: Payer: Self-pay

## 2012-10-01 SURGERY — LYMPHADENECTOMY, AXILLARY
Anesthesia: General | Laterality: Right

## 2012-10-03 ENCOUNTER — Ambulatory Visit: Payer: Self-pay | Admitting: Nutrition

## 2012-10-03 ENCOUNTER — Telehealth: Payer: Self-pay | Admitting: Nutrition

## 2012-10-03 NOTE — Telephone Encounter (Signed)
Patient called me on the telephone requesting information on a healthy vegetarian diet. Patient would like to consume healthy foods throughout chemotherapy for her breast cancer. She will work to ensure that she is eating the best she can. So far she has not experienced significant side effects. She did have one episode of constipation which was resolved with milk of Magnesia. I briefly educated patient on healthy vegetarian diet. I've encouraged plenty of lean protein sources and encouraged patient to achieve weight maintenance throughout treatment. I have provided written education along with recipes and menus for patient to pick up tomorrow when she comes in for treatment at her request. She has my contact information and agrees to call me with further questions or concerns.

## 2012-10-04 ENCOUNTER — Ambulatory Visit (HOSPITAL_BASED_OUTPATIENT_CLINIC_OR_DEPARTMENT_OTHER): Payer: Medicaid Other | Admitting: Oncology

## 2012-10-04 ENCOUNTER — Telehealth: Payer: Self-pay | Admitting: Oncology

## 2012-10-04 ENCOUNTER — Other Ambulatory Visit (HOSPITAL_BASED_OUTPATIENT_CLINIC_OR_DEPARTMENT_OTHER): Payer: Medicaid Other | Admitting: Lab

## 2012-10-04 ENCOUNTER — Ambulatory Visit (HOSPITAL_BASED_OUTPATIENT_CLINIC_OR_DEPARTMENT_OTHER): Payer: Medicaid Other

## 2012-10-04 VITALS — BP 136/80 | HR 84 | Temp 98.9°F | Resp 20 | Ht 60.0 in | Wt 125.3 lb

## 2012-10-04 DIAGNOSIS — C50919 Malignant neoplasm of unspecified site of unspecified female breast: Secondary | ICD-10-CM

## 2012-10-04 DIAGNOSIS — Z5111 Encounter for antineoplastic chemotherapy: Secondary | ICD-10-CM

## 2012-10-04 DIAGNOSIS — C773 Secondary and unspecified malignant neoplasm of axilla and upper limb lymph nodes: Secondary | ICD-10-CM

## 2012-10-04 DIAGNOSIS — Z171 Estrogen receptor negative status [ER-]: Secondary | ICD-10-CM

## 2012-10-04 LAB — CBC WITH DIFFERENTIAL/PLATELET
Basophils Absolute: 0 10*3/uL (ref 0.0–0.1)
EOS%: 0.2 % (ref 0.0–7.0)
HCT: 36.8 % (ref 34.8–46.6)
HGB: 12.4 g/dL (ref 11.6–15.9)
LYMPH%: 39.2 % (ref 14.0–49.7)
MCH: 28.4 pg (ref 25.1–34.0)
MCHC: 33.7 g/dL (ref 31.5–36.0)
MCV: 84.2 fL (ref 79.5–101.0)
MONO%: 13.4 % (ref 0.0–14.0)
NEUT%: 46.6 % (ref 38.4–76.8)
Platelets: 240 10*3/uL (ref 145–400)
lymph#: 2.1 10*3/uL (ref 0.9–3.3)

## 2012-10-04 LAB — COMPREHENSIVE METABOLIC PANEL (CC13)
Albumin: 4.2 g/dL (ref 3.5–5.0)
Alkaline Phosphatase: 92 U/L (ref 40–150)
BUN: 11 mg/dL (ref 7.0–26.0)
Creatinine: 0.8 mg/dL (ref 0.6–1.1)
Glucose: 92 mg/dl (ref 70–99)
Potassium: 4.2 mEq/L (ref 3.5–5.1)

## 2012-10-04 MED ORDER — SODIUM CHLORIDE 0.9 % IV SOLN
Freq: Once | INTRAVENOUS | Status: AC
  Administered 2012-10-04: 15:00:00 via INTRAVENOUS

## 2012-10-04 MED ORDER — PACLITAXEL PROTEIN-BOUND CHEMO INJECTION 100 MG
100.0000 mg/m2 | Freq: Once | INTRAVENOUS | Status: AC
  Administered 2012-10-04: 150 mg via INTRAVENOUS
  Filled 2012-10-04: qty 30

## 2012-10-04 MED ORDER — HEPARIN SOD (PORK) LOCK FLUSH 100 UNIT/ML IV SOLN
500.0000 [IU] | Freq: Once | INTRAVENOUS | Status: AC | PRN
  Administered 2012-10-04: 500 [IU]
  Filled 2012-10-04: qty 5

## 2012-10-04 MED ORDER — DEXAMETHASONE SODIUM PHOSPHATE 10 MG/ML IJ SOLN
10.0000 mg | Freq: Once | INTRAMUSCULAR | Status: AC
  Administered 2012-10-04: 10 mg via INTRAVENOUS

## 2012-10-04 MED ORDER — SODIUM CHLORIDE 0.9 % IJ SOLN
10.0000 mL | INTRAMUSCULAR | Status: DC | PRN
  Administered 2012-10-04: 10 mL
  Filled 2012-10-04: qty 10

## 2012-10-04 MED ORDER — ONDANSETRON 8 MG/50ML IVPB (CHCC)
8.0000 mg | Freq: Once | INTRAVENOUS | Status: AC
  Administered 2012-10-04: 8 mg via INTRAVENOUS

## 2012-10-04 NOTE — Patient Instructions (Addendum)
[facility] Discharge Instructions for Patients Receiving Chemotherapy  Today you received the following chemotherapy agents abraxane  To help prevent nausea and vomiting after your treatment, we encourage you to take your nausea medication  and take it as often as prescribed   If you develop nausea and vomiting that is not controlled by your nausea medication, call the clinic. If it is after clinic hours your family physician or the after hours number for the clinic or go to the Emergency Department.   BELOW ARE SYMPTOMS THAT SHOULD BE REPORTED IMMEDIATELY:  *FEVER GREATER THAN 100.5 F  *CHILLS WITH OR WITHOUT FEVER  NAUSEA AND VOMITING THAT IS NOT CONTROLLED WITH YOUR NAUSEA MEDICATION  *UNUSUAL SHORTNESS OF BREATH  *UNUSUAL BRUISING OR BLEEDING  TENDERNESS IN MOUTH AND THROAT WITH OR WITHOUT PRESENCE OF ULCERS  *URINARY PROBLEMS  *BOWEL PROBLEMS  UNUSUAL RASH Items with * indicate a potential emergency and should be followed up as soon as possible.  One of the nurses will contact you 24 hours after your treatment. Please let the nurse know about any problems that you may have experienced. Feel free to call the clinic you have any questions or concerns. The clinic phone number is [phone].   I have been informed and understand all the instructions given to me. I know to contact the clinic, my physician, or go to the Emergency Department if any problems should occur. I do not have any questions at this time, but understand that I may call the clinic during office hours   should I have any questions or need assistance in obtaining follow up care.    __________________________________________  _____________  __________ Signature of Patient or Authorized Representative            Date                   Time    __________________________________________ Nurse's Signature

## 2012-10-04 NOTE — Telephone Encounter (Signed)
gve the pt her jan,feb 2014 appt calendar

## 2012-10-04 NOTE — Progress Notes (Signed)
Hematology and Oncology Follow Up Visit  Debbie Larsen [id number] [birth year] 60 y.o. 10/04/2012 2:17 PM   DIAGNOSIS:   Recurrent breast cancer in rt axilla, triple-negative.  PAST THERAPY:  CMF x 6 cycles,   Interim History:  Cycle 1 , week 2 abraxane., she is doing fairly well, she notes some weakness, in her index and thumb of the right hand. There is no sensory change. She notes some skin changes in her upper chest.  Medications: I have reviewed the patient's current medications.  Allergies: No Known Allergies  Past Medical History, Surgical history, Social history, and Family History were reviewed and updated.  Review of Systems: Constitutional:  Negative for fever, chills, night sweats, anorexia, weight loss, pain. Cardiovascular: no chest pain or dyspnea on exertion Respiratory: no cough, shortness of breath, or wheezing Neurological: no TIA or stroke symptoms Dermatological: negative ENT: negative Skin Gastrointestinal: negative Genito-Urinary: negative Hematological and Lymphatic: negative Breast: negative Musculoskeletal: negative Remaining ROS negative.  Physical Exam:  Blood pressure 136/80, pulse 84, temperature 98.9 F (37.2 C), resp. rate 20, height 5' (1.524 m), weight 125 lb 4.8 oz (56.836 kg).  ECOG: 1  HEENT:  Sclerae anicteric, conjunctivae pink.  Oropharynx clear.  No mucositis or candidiasis.  Nodes:  No cervical, supraclavicular, or axillary lymphadenopathy palpated.  Breast Exam:  Right breast is benign.  No masses, discharge, skin change, or nipple inversion.  Left breast is benign.  No masses, discharge, skin change, or nipple inversion. Rt axillary mass, fixed..  Lungs:  Clear to auscultation bilaterally.  No crackles, rhonchi, or wheezes.  Heart:  Regular rate and rhythm.  Abdomen:  Soft, nontender.  Positive bowel sounds.  No organomegaly or masses palpated.  Musculoskeletal:  No focal spinal tenderness to palpation.  Extremities:  Benign.  No  peripheral edema or cyanosis.  Skin:  Benign.  Neuro:  Nonfocal. , note of thenar atrophy rt hand. She has weakness of muscles innevated by median nerve.    Lab Results: Lab Results  Component Value Date   WBC 5.3 10/04/2012   HGB 12.4 10/04/2012   HCT 36.8 10/04/2012   MCV 84.2 10/04/2012   PLT 240 10/04/2012     Chemistry      Component Value Date/Time   NA 138 09/26/2012 1318   NA 138 09/18/2012 1531   K 3.9 09/26/2012 1318   K 3.7 09/18/2012 1531   CL 102 09/26/2012 1318   CL 103 09/18/2012 1531   CO2 26 09/26/2012 1318   CO2 23 09/18/2012 1531   BUN 12.0 09/26/2012 1318   BUN 9 09/18/2012 1531   CREATININE 0.8 09/26/2012 1318   CREATININE 0.70 09/18/2012 1531   CREATININE 0.74 03/01/2012 1715      Component Value Date/Time   CALCIUM 9.6 09/26/2012 1318   CALCIUM 9.8 09/18/2012 1531   ALKPHOS 82 09/26/2012 1318   ALKPHOS 81 09/18/2012 1531   AST 13 09/26/2012 1318   AST 18 09/18/2012 1531   ALT 17 09/26/2012 1318   ALT 13 09/18/2012 1531   BILITOT 0.35 09/26/2012 1318   BILITOT 0.3 09/18/2012 1531       Radiological Studies:  Mr Breast Bilateral W Wo Contrast  08/31/2012  *RADIOLOGY REPORT*  Clinical Data: right lumpectomy in 2003 with recent ultrasound- guided core biopsy demonstrating metastatic right axillary adenopathy  BILATERAL BREAST MRI WITH AND WITHOUT CONTRAST  Technique: Multiplanar, multisequence MR images of both breasts were obtained prior to and following the intravenous administration of 11ml of multihance.  Three dimensional images were evaluated at the independent DynaCad workstation.  Comparison:  prior studies  Findings: There is minimal background parenchymal enhancement. Neither breast demosntrates evidence of mass or suspicious enhancment.  There are normal appearing left axillary lymph nodes. On the right, in the high axilla, there is a 12 x 10mm lymph node. Immediately inferior to this is a 27 x 22mm lymph node demonstrating rim enhancement.  An  8mm lymph node is immediately anterolateral to this, and there is a 1cm lymph node immediately posteromedial to the dominant lymph node.  None of the lymph nodes demonstrate normal morphology or normal fatty hila.  In the right lobe of the liver, there is a T2-hyperintense, enhancing mass measuring about 1.5cm, showing progressive enhancement throughout the study, and minimal early enhancement.  IMPRESSION: 1.  Abnormal right axillary lymph nodes consistent with metastasis.  2.   No suspicious findings within either breast.  3.   Enhancing mass right lobe of liver.  While hepatic metastasis is not excluded, this may represent a hemangioma.  Recommend further imaging evaluation of this finding.  Liver protocol CT or MRI, or PET/CT could be considered to further evaluate.  BI-RADS CATEGORY 6:  Known biopsy-proven malignancy - appropriate action should be taken.   THREE-DIMENSIONAL MR IMAGE RENDERING ON INDEPENDENT WORKSTATION:  Three-dimensional MR images were rendered by post-processing of the original MR data on an independent workstation.  The three- dimensional MR images were interpreted, and findings were reported in the accompanying complete MRI report for this study.   Original Report Authenticated By: Esperanza Heir, M.D.    Nm Pet Image Initial (pi) Skull Base To Thigh  09/12/2012  *RADIOLOGY REPORT*  Clinical Data: Subsequent treatment strategy for breast cancer. History right breast cancer in 2002  NUCLEAR MEDICINE PET SKULL BASE TO THIGH  Fasting Blood Glucose:  126  Technique:  17.5 mCi F-18 FDG was injected intravenously.   CT data was obtained and used for attenuation correction and anatomic localization only.  (This was not acquired as a diagnostic CT examination.) Additional exam technical data entered  on technologist worksheet.  Comparison: None  Findings:  Neck: No hypermetabolic nodes in the neck.  There is misregistration between the CT data set and the PET data set in the head neck.  No  hypermetabolic neck lymph nodes are noted.  Of the  Chest: There is hypermetabolic mass in the right axilla measuring 18 mm (image 55) with intense metabolic activity ( SUV max = 9.7. Several smaller hypermetabolic axillary nodes more superiorly are evident on the PET data set image 42 and 47.  In the mediastinum, there is hypermetabolic paratracheal lymph node on the right measuring 18 mm short axis with intense metabolic activity ( SUV max = 40.9).  Hypermetabolic subcarinal and left hilar lymph node.  Hypermetabolic prevascular lymph node measures 10 mm with SUV max = 8.3.  Review of the lung parenchyma demonstrates several hypermetabolic nodules within the left lung.  These include 7 mm nodule (image 71) in the left upper lobe and an 8 mm nodule within the left lower lobe. These have mild metabolic activity ( S U V max 3.0)  and concerning for metastasis.   There is a fibrotic pattern in the lateral right upper lobe which likely represents radiation change.  Abdomen / Pelvis:There is no discrete abnormal hypermetabolic activity within the right hepatic lobe correspond to the MRI findings.  No hypermetabolic abdominal or pelvic lymph nodes.  Skeleton:No focal hypermetabolic activity to suggest  skeletal metastasis.  IMPRESSION:  1.  Hypermetabolic right axillary lymph nodes consistent with local recurrence  of breast cancer. 2.  Hypermetabolic ipsilateral and contralateral mediastinal nodal metastasis. 3.  Hypermetabolic nodules within the left lung most concerning for pulmonary metastasis. 4. No PET CT evidence of hepatic metastasis.   Original Report Authenticated By: Genevive Bi, M.D.    Dg Chest Port 1 View  09/19/2012  *RADIOLOGY REPORT*  Clinical Data: Left-sided Port-A-Cath placement.  PORTABLE CHEST - 1 VIEW  Comparison: PET CT from 09/12/2012  Findings: 1713 hours.  Left-sided Port-A-Cath tip projects at the distal SVC level, near the junction of the SVC and RA. No evidence for a kink in the  catheter tubing.  No evidence for left-sided pneumothorax. The cardiopericardial silhouette is enlarged. Interstitial markings are diffusely coarsened with chronic features.  Multiple bilateral pulmonary nodules seen on the PET CT are not readily evident on this portable chest x-ray.  IMPRESSION: No evidence for left-sided pneumothorax status post Port-A-Cath placement.   Original Report Authenticated By: Kennith Center, M.D.    Dg Fluoro Guide Cv Line-no Report  09/19/2012  CLINICAL DATA: surgery   FLOURO GUIDE CV LINE  Fluoroscopy was utilized by the requesting physician.  No radiographic  interpretation.       IMPRESSIONS AND PLAN: A 61 y.o. female with   Recurrent breast cancer, Triple engative on abraxane based chemotherapy. Due for week 2 cycle 1 on 10/01/12.she still has hard fixed nodes in the rt axilla, I am concerned about possible brachail plexus issues and have a low threshold to switch to a different regimen. She will be sen in 1 week.  Spent more than half the time coordinating care, as well as discussion of BMI and its implications.      Roniya Tetro 12/5/20132:17 PM Cell [phone]

## 2012-10-11 ENCOUNTER — Telehealth: Payer: Self-pay | Admitting: Family Medicine

## 2012-10-11 DIAGNOSIS — Z1211 Encounter for screening for malignant neoplasm of colon: Secondary | ICD-10-CM

## 2012-10-11 DIAGNOSIS — E039 Hypothyroidism, unspecified: Secondary | ICD-10-CM

## 2012-10-11 NOTE — Telephone Encounter (Signed)
Is changing pharmacy's and needs her synthroid called to [facility] [address] - with a 3 month supply

## 2012-10-11 NOTE — Telephone Encounter (Signed)
Pt is also asking to speak with Dr Armen Pickup

## 2012-10-12 ENCOUNTER — Encounter: Payer: Self-pay | Admitting: *Deleted

## 2012-10-12 MED ORDER — LEVOTHYROXINE SODIUM 75 MCG PO TABS: 75.0000 ug | ORAL_TABLET | Freq: Every day | ORAL | Status: DC

## 2012-10-12 NOTE — Telephone Encounter (Signed)
Patient asked about screening colonoscopy placed referral to GI. Patient request screening colonoscopy in January.

## 2012-10-12 NOTE — Telephone Encounter (Addendum)
Synthroid refill sent to [facility]. Called patient. She updated me regarding her cancer treatment.

## 2012-10-12 NOTE — Progress Notes (Signed)
Clinical Social Worker contacted pt at home to address needs with transportation.  CSW informed pt of transportation resources available, and pt was agreeable to CSW contacting [facility].  CSW arranged for [facility] to provided transportation to pt's appointment on 12/19.  CSW informed pt that transportation was arranged and process for scheduling future appointments.  Pt also requested information on obtaining a wig.  CSW informed pt that she was eligible for 2 wig vouchers.  CSW completed voucher and left at front desk for pt to pick up.  CSW encouraged pt to cal with any additional needs or concerns.    Tamala Julian, MSW, LCSW Clinical Social Worker [facility] [phone]

## 2012-10-16 ENCOUNTER — Encounter (HOSPITAL_COMMUNITY): Payer: Self-pay | Admitting: Dietician

## 2012-10-16 NOTE — Progress Notes (Signed)
Breast Cancer Nutrition Class Attendance Note  Date: 10/16/2012  Pt attended [facility]'s Breast Cancer Nutrition Class, "Food For Your Fight". Pt was educated on basic cancer nutrition principles, including plant based diet and principles from [employer]  [employer]) about latest nutrition findings and recommendations. Questions answered. Handouts and recipes provided.   Melody Haver, RD, LDN Pager: [phone]

## 2012-10-16 NOTE — Progress Notes (Signed)
Breast Cancer Nutrition Class Attendance Note  Date: 10/16/2012  Pt attended [facility]'s Breast Cancer Nutrition Class, "Food For Your Fight". Pt was educated on basic cancer nutrition principles, including plant based diet and principles from [employer]  ([employer]) about latest nutrition findings and recommendations. Questions answered. Handouts and recipes provided.   Imanuel Pruiett A. Kayan, RD, LDN Pager: [phone] 

## 2012-10-18 ENCOUNTER — Ambulatory Visit (HOSPITAL_BASED_OUTPATIENT_CLINIC_OR_DEPARTMENT_OTHER): Payer: Medicaid Other | Admitting: Oncology

## 2012-10-18 ENCOUNTER — Encounter: Payer: Self-pay | Admitting: Internal Medicine

## 2012-10-18 ENCOUNTER — Telehealth (INDEPENDENT_AMBULATORY_CARE_PROVIDER_SITE_OTHER): Payer: Self-pay | Admitting: General Surgery

## 2012-10-18 ENCOUNTER — Other Ambulatory Visit (HOSPITAL_BASED_OUTPATIENT_CLINIC_OR_DEPARTMENT_OTHER): Payer: Medicaid Other | Admitting: Lab

## 2012-10-18 ENCOUNTER — Ambulatory Visit (HOSPITAL_BASED_OUTPATIENT_CLINIC_OR_DEPARTMENT_OTHER): Payer: Medicaid Other

## 2012-10-18 VITALS — BP 126/74 | HR 72 | Temp 98.6°F | Resp 20 | Ht 60.0 in | Wt 127.1 lb

## 2012-10-18 DIAGNOSIS — C50919 Malignant neoplasm of unspecified site of unspecified female breast: Secondary | ICD-10-CM

## 2012-10-18 DIAGNOSIS — C773 Secondary and unspecified malignant neoplasm of axilla and upper limb lymph nodes: Secondary | ICD-10-CM

## 2012-10-18 DIAGNOSIS — Z5111 Encounter for antineoplastic chemotherapy: Secondary | ICD-10-CM

## 2012-10-18 DIAGNOSIS — Z171 Estrogen receptor negative status [ER-]: Secondary | ICD-10-CM

## 2012-10-18 LAB — COMPREHENSIVE METABOLIC PANEL (CC13)
Albumin: 3.8 g/dL (ref 3.5–5.0)
BUN: 9 mg/dL (ref 7.0–26.0)
Calcium: 9.1 mg/dL (ref 8.4–10.4)
Chloride: 107 mEq/L (ref 98–107)
Glucose: 111 mg/dl — ABNORMAL HIGH (ref 70–99)
Potassium: 3.9 mEq/L (ref 3.5–5.1)

## 2012-10-18 LAB — CBC WITH DIFFERENTIAL/PLATELET
Basophils Absolute: 0 10*3/uL (ref 0.0–0.1)
Eosinophils Absolute: 0 10*3/uL (ref 0.0–0.5)
HCT: 33.4 % — ABNORMAL LOW (ref 34.8–46.6)
HGB: 11.4 g/dL — ABNORMAL LOW (ref 11.6–15.9)
MCV: 85.6 fL (ref 79.5–101.0)
MONO%: 11.3 % (ref 0.0–14.0)
NEUT#: 2.6 10*3/uL (ref 1.5–6.5)
RDW: 14 % (ref 11.2–14.5)
lymph#: 2.2 10*3/uL (ref 0.9–3.3)

## 2012-10-18 MED ORDER — SODIUM CHLORIDE 0.9 % IV SOLN
Freq: Once | INTRAVENOUS | Status: AC
  Administered 2012-10-18: 15:00:00 via INTRAVENOUS

## 2012-10-18 MED ORDER — DEXAMETHASONE SODIUM PHOSPHATE 10 MG/ML IJ SOLN
10.0000 mg | Freq: Once | INTRAMUSCULAR | Status: AC
  Administered 2012-10-18: 10 mg via INTRAVENOUS

## 2012-10-18 MED ORDER — SODIUM CHLORIDE 0.9 % IJ SOLN
10.0000 mL | INTRAMUSCULAR | Status: DC | PRN
  Administered 2012-10-18: 10 mL
  Filled 2012-10-18: qty 10

## 2012-10-18 MED ORDER — HEPARIN SOD (PORK) LOCK FLUSH 100 UNIT/ML IV SOLN
500.0000 [IU] | Freq: Once | INTRAVENOUS | Status: AC | PRN
  Administered 2012-10-18: 500 [IU]
  Filled 2012-10-18: qty 5

## 2012-10-18 MED ORDER — PACLITAXEL PROTEIN-BOUND CHEMO INJECTION 100 MG
100.0000 mg/m2 | Freq: Once | INTRAVENOUS | Status: AC
  Administered 2012-10-18: 150 mg via INTRAVENOUS
  Filled 2012-10-18: qty 30

## 2012-10-18 MED ORDER — LIDOCAINE-PRILOCAINE 2.5-2.5 % EX CREA: TOPICAL_CREAM | Freq: Once | CUTANEOUS | Status: DC

## 2012-10-18 MED ORDER — ONDANSETRON 8 MG/50ML IVPB (CHCC)
8.0000 mg | Freq: Once | INTRAVENOUS | Status: AC
  Administered 2012-10-18: 8 mg via INTRAVENOUS

## 2012-10-18 NOTE — Patient Instructions (Addendum)
[facility] Discharge Instructions for Patients Receiving Chemotherapy  Today you received the following chemotherapy agents Abraxane  To help prevent nausea and vomiting after your treatment, we encourage you to take your nausea medication as prescribed.   If you develop nausea and vomiting that is not controlled by your nausea medication, call the clinic. If it is after clinic hours your family physician or the after hours number for the clinic or go to the Emergency Department.   BELOW ARE SYMPTOMS THAT SHOULD BE REPORTED IMMEDIATELY:  *FEVER GREATER THAN 100.5 F  *CHILLS WITH OR WITHOUT FEVER  NAUSEA AND VOMITING THAT IS NOT CONTROLLED WITH YOUR NAUSEA MEDICATION  *UNUSUAL SHORTNESS OF BREATH  *UNUSUAL BRUISING OR BLEEDING  TENDERNESS IN MOUTH AND THROAT WITH OR WITHOUT PRESENCE OF ULCERS  *URINARY PROBLEMS  *BOWEL PROBLEMS  UNUSUAL RASH Items with * indicate a potential emergency and should be followed up as soon as possible.  Feel free to call the clinic you have any questions or concerns. The clinic phone number is [phone].   I have been informed and understand all the instructions given to me. I know to contact the clinic, my physician, or go to the Emergency Department if any problems should occur. I do not have any questions at this time, but understand that I may call the clinic during office hours   should I have any questions or need assistance in obtaining follow up care.    __________________________________________  _____________  __________ Signature of Patient or Authorized Representative            Date                   Time    __________________________________________ Nurse's Signature

## 2012-10-18 NOTE — Telephone Encounter (Signed)
Message copied by Liliana Cline on Thu Oct 18, 2012  3:07 PM ------      Message from: Jean Rosenthal A      Created: Thu Oct 18, 2012  1:56 PM       Dr. Jamey Ripa,      Debbie Larsen has been undergoing chemo and is responding well.        Dr. 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                She is being seen be Dr. Darnelle Catalan 11/01/12 who will assume her care.   We may want to restage with a PET- that Dr Darnelle Catalan can decide when he sees her.               THANKS      Tami

## 2012-10-18 NOTE — Telephone Encounter (Signed)
Appt made 11/21/2011 @ 2:40 pm. [facility] making patient aware of appt.

## 2012-10-18 NOTE — Progress Notes (Signed)
Hematology and Oncology Follow Up Visit  Debbie Larsen [id number] 1951-01-04 61 y.o. 10/18/2012 1:18 PM   DIAGNOSIS:   Recurrent breast cancer in rt axilla, triple-negative.  PAST THERAPY:  CMF x 6 cycles,   Interim History:  Cycle 1 , week 3  abraxane., she is doing fairly well, she notes some weakness, in her index and thumb of the right hand. There is no sensory change.she thinks the mass under her axilla is better, her pain control is good. She had a myriad number of questions regarding clinical trials and new data on triple negative cancer using velapirib.  Medications: I have reviewed the patient's current medications.  Allergies: No Known Allergies  Past Medical History, Surgical history, Social history, and Family History were reviewed and updated.  Review of Systems: Constitutional:  Negative for fever, chills, night sweats, anorexia, weight loss, pain. Cardiovascular: no chest pain or dyspnea on exertion Respiratory: no cough, shortness of breath, or wheezing Neurological: no TIA or stroke symptoms Dermatological: negative ENT: negative Skin Gastrointestinal: negative Genito-Urinary: negative Hematological and Lymphatic: negative Breast: negative Musculoskeletal: negative Remaining ROS negative.  Physical Exam:  Blood pressure 126/74, pulse 72, temperature 98.6 F (37 C), temperature source Oral, resp. rate 20, height 5' (1.524 m), weight 127 lb 1.6 oz (57.652 kg).  ECOG: 1  HEENT:  Sclerae anicteric, conjunctivae pink.  Oropharynx clear.  No mucositis or candidiasis.  Nodes:  No cervical, supraclavicular, or axillary lymphadenopathy palpated.  Breast Exam:  Right breast is benign.  No masses, discharge, skin change, or nipple inversion.  Left breast is benign.  No masses, discharge, skin change, or nipple inversion. Rt axillary mass,which I think has shrunk significantly. It is still  Fixed to underlying tissue..  Lungs:  Clear to auscultation bilaterally.   No crackles, rhonchi, or wheezes.  Heart:  Regular rate and rhythm.  Abdomen:  Soft, nontender.  Positive bowel sounds.  No organomegaly or masses palpated.  Musculoskeletal:  No focal spinal tenderness to palpation.  Extremities:  Benign.  No peripheral edema or cyanosis.  Skin:  Benign.  Neuro:  Nonfocal. , note of thenar atrophy rt hand. She has weakness of muscles innevated by median nerve.    Lab Results: Lab Results  Component Value Date   WBC 5.3 10/04/2012   HGB 12.4 10/04/2012   HCT 36.8 10/04/2012   MCV 84.2 10/04/2012   PLT 240 10/04/2012     Chemistry      Component Value Date/Time   NA 139 10/04/2012 1314   NA 138 09/18/2012 1531   K 4.2 10/04/2012 1314   K 3.7 09/18/2012 1531   CL 105 10/04/2012 1314   CL 103 09/18/2012 1531   CO2 25 10/04/2012 1314   CO2 23 09/18/2012 1531   BUN 11.0 10/04/2012 1314   BUN 9 09/18/2012 1531   CREATININE 0.8 10/04/2012 1314   CREATININE 0.70 09/18/2012 1531   CREATININE 0.74 03/01/2012 1715      Component Value Date/Time   CALCIUM 9.9 10/04/2012 1314   CALCIUM 9.8 09/18/2012 1531   ALKPHOS 92 10/04/2012 1314   ALKPHOS 81 09/18/2012 1531   AST 14 10/04/2012 1314   AST 18 09/18/2012 1531   ALT 12 10/04/2012 1314   ALT 13 09/18/2012 1531   BILITOT 0.46 10/04/2012 1314   BILITOT 0.3 09/18/2012 1531       Radiological Studies:  Mr Breast Bilateral W Wo Contrast  08/31/2012  *RADIOLOGY REPORT*  Clinical Data: right lumpectomy in 2003 with recent  ultrasound- guided core biopsy demonstrating metastatic right axillary adenopathy  BILATERAL BREAST MRI WITH AND WITHOUT CONTRAST  Technique: Multiplanar, multisequence MR images of both breasts were obtained prior to and following the intravenous administration of 11ml of multihance.  Three dimensional images were evaluated at the independent DynaCad workstation.  Comparison:  prior studies  Findings: There is minimal background parenchymal enhancement. Neither breast demosntrates evidence of mass or  suspicious enhancment.  There are normal appearing left axillary lymph nodes. On the right, in the high axilla, there is a 12 x 10mm lymph node. Immediately inferior to this is a 27 x 22mm lymph node demonstrating rim enhancement.  An 8mm lymph node is immediately anterolateral to this, and there is a 1cm lymph node immediately posteromedial to the dominant lymph node.  None of the lymph nodes demonstrate normal morphology or normal fatty hila.  In the right lobe of the liver, there is a T2-hyperintense, enhancing mass measuring about 1.5cm, showing progressive enhancement throughout the study, and minimal early enhancement.  IMPRESSION: 1.  Abnormal right axillary lymph nodes consistent with metastasis.  2.   No suspicious findings within either breast.  3.   Enhancing mass right lobe of liver.  While hepatic metastasis is not excluded, this may represent a hemangioma.  Recommend further imaging evaluation of this finding.  Liver protocol CT or MRI, or PET/CT could be considered to further evaluate.  BI-RADS CATEGORY 6:  Known biopsy-proven malignancy - appropriate action should be taken.   THREE-DIMENSIONAL MR IMAGE RENDERING ON INDEPENDENT WORKSTATION:  Three-dimensional MR images were rendered by post-processing of the original MR data on an independent workstation.  The three- dimensional MR images were interpreted, and findings were reported in the accompanying complete MRI report for this study.   Original Report Authenticated By: Esperanza Heir, M.D.    Nm Pet Image Initial (pi) Skull Base To Thigh  09/12/2012  *RADIOLOGY REPORT*  Clinical Data: Subsequent treatment strategy for breast cancer. History right breast cancer in 2002  NUCLEAR MEDICINE PET SKULL BASE TO THIGH  Fasting Blood Glucose:  126  Technique:  17.5 mCi F-18 FDG was injected intravenously.   CT data was obtained and used for attenuation correction and anatomic localization only.  (This was not acquired as a diagnostic CT examination.)  Additional exam technical data entered  on technologist worksheet.  Comparison: None  Findings:  Neck: No hypermetabolic nodes in the neck.  There is misregistration between the CT data set and the PET data set in the head neck.  No hypermetabolic neck lymph nodes are noted.  Of the  Chest: There is hypermetabolic mass in the right axilla measuring 18 mm (image 55) with intense metabolic activity ( SUV max = 9.7. Several smaller hypermetabolic axillary nodes more superiorly are evident on the PET data set image 42 and 47.  In the mediastinum, there is hypermetabolic paratracheal lymph node on the right measuring 18 mm short axis with intense metabolic activity ( SUV max = 11.9).  Hypermetabolic subcarinal and left hilar lymph node.  Hypermetabolic prevascular lymph node measures 10 mm with SUV max = 8.3.  Review of the lung parenchyma demonstrates several hypermetabolic nodules within the left lung.  These include 7 mm nodule (image 71) in the left upper lobe and an 8 mm nodule within the left lower lobe. These have mild metabolic activity ( S U V max 3.0)  and concerning for metastasis.   There is a fibrotic pattern in the lateral right upper lobe  which likely represents radiation change.  Abdomen / Pelvis:There is no discrete abnormal hypermetabolic activity within the right hepatic lobe correspond to the MRI findings.  No hypermetabolic abdominal or pelvic lymph nodes.  Skeleton:No focal hypermetabolic activity to suggest skeletal metastasis.  IMPRESSION:  1.  Hypermetabolic right axillary lymph nodes consistent with local recurrence  of breast cancer. 2.  Hypermetabolic ipsilateral and contralateral mediastinal nodal metastasis. 3.  Hypermetabolic nodules within the left lung most concerning for pulmonary metastasis. 4. No PET CT evidence of hepatic metastasis.   Original Report Authenticated By: Genevive Bi, M.D.    Dg Chest Port 1 View  09/19/2012  *RADIOLOGY REPORT*  Clinical Data: Left-sided  Port-A-Cath placement.  PORTABLE CHEST - 1 VIEW  Comparison: PET CT from 09/12/2012  Findings: 1713 hours.  Left-sided Port-A-Cath tip projects at the distal SVC level, near the junction of the SVC and RA. No evidence for a kink in the catheter tubing.  No evidence for left-sided pneumothorax. The cardiopericardial silhouette is enlarged. Interstitial markings are diffusely coarsened with chronic features.  Multiple bilateral pulmonary nodules seen on the PET CT are not readily evident on this portable chest x-ray.  IMPRESSION: No evidence for left-sided pneumothorax status post Port-A-Cath placement.   Original Report Authenticated By: Kennith Center, M.D.    Dg Fluoro Guide Cv Line-no Report  09/19/2012  CLINICAL DATA: surgery   FLOURO GUIDE CV LINE  Fluoroscopy was utilized by the requesting physician.  No radiographic  interpretation.       IMPRESSIONS AND PLAN: A 61 y.o. female with   Recurrent breast cancer, Triple engative on abraxane based chemotherapy. Due for week 3 cycle 1.she still has hard fixed nodes in the rt axilla,  though Ithink they have shrunk and Debbie Larsen concurs She will be off next week, and  She will return in early jan to begin cycle 2. I think imaging after 2 cycles would be appropriate with possible consideration for node dissection followed by xrt.        Modest Draeger 12/19/20131:18 PM Cell [phone]

## 2012-10-25 ENCOUNTER — Ambulatory Visit: Payer: Self-pay | Admitting: Oncology

## 2012-10-25 ENCOUNTER — Ambulatory Visit: Payer: Self-pay

## 2012-10-25 ENCOUNTER — Other Ambulatory Visit: Payer: Self-pay | Admitting: Lab

## 2012-10-29 ENCOUNTER — Other Ambulatory Visit: Payer: Self-pay | Admitting: *Deleted

## 2012-10-29 DIAGNOSIS — C50919 Malignant neoplasm of unspecified site of unspecified female breast: Secondary | ICD-10-CM

## 2012-11-01 ENCOUNTER — Telehealth: Payer: Self-pay | Admitting: *Deleted

## 2012-11-01 ENCOUNTER — Other Ambulatory Visit (HOSPITAL_BASED_OUTPATIENT_CLINIC_OR_DEPARTMENT_OTHER): Payer: Medicaid Other | Admitting: Lab

## 2012-11-01 ENCOUNTER — Other Ambulatory Visit: Payer: Self-pay | Admitting: Lab

## 2012-11-01 ENCOUNTER — Ambulatory Visit (HOSPITAL_BASED_OUTPATIENT_CLINIC_OR_DEPARTMENT_OTHER): Payer: Medicaid Other

## 2012-11-01 ENCOUNTER — Ambulatory Visit (HOSPITAL_BASED_OUTPATIENT_CLINIC_OR_DEPARTMENT_OTHER): Payer: Medicaid Other | Admitting: Oncology

## 2012-11-01 ENCOUNTER — Ambulatory Visit: Payer: Self-pay | Admitting: Oncology

## 2012-11-01 VITALS — BP 150/76 | HR 79 | Temp 97.9°F | Resp 20 | Ht 60.0 in | Wt 125.8 lb

## 2012-11-01 DIAGNOSIS — C50219 Malignant neoplasm of upper-inner quadrant of unspecified female breast: Secondary | ICD-10-CM

## 2012-11-01 DIAGNOSIS — Z5111 Encounter for antineoplastic chemotherapy: Secondary | ICD-10-CM

## 2012-11-01 DIAGNOSIS — C50919 Malignant neoplasm of unspecified site of unspecified female breast: Secondary | ICD-10-CM

## 2012-11-01 DIAGNOSIS — R911 Solitary pulmonary nodule: Secondary | ICD-10-CM

## 2012-11-01 DIAGNOSIS — C773 Secondary and unspecified malignant neoplasm of axilla and upper limb lymph nodes: Secondary | ICD-10-CM

## 2012-11-01 LAB — CBC WITH DIFFERENTIAL/PLATELET
Eosinophils Absolute: 0 10*3/uL (ref 0.0–0.5)
HCT: 34.1 % — ABNORMAL LOW (ref 34.8–46.6)
LYMPH%: 37.6 % (ref 14.0–49.7)
MCV: 82.6 fL (ref 79.5–101.0)
MONO#: 0.8 10*3/uL (ref 0.1–0.9)
MONO%: 13.3 % (ref 0.0–14.0)
NEUT#: 2.7 10*3/uL (ref 1.5–6.5)
NEUT%: 47.7 % (ref 38.4–76.8)
Platelets: 250 10*3/uL (ref 145–400)
RBC: 4.13 10*6/uL (ref 3.70–5.45)
nRBC: 0 % (ref 0–0)

## 2012-11-01 LAB — COMPREHENSIVE METABOLIC PANEL (CC13)
ALT: 10 U/L (ref 0–55)
BUN: 11 mg/dL (ref 7.0–26.0)
CO2: 23 mEq/L (ref 22–29)
Calcium: 9.6 mg/dL (ref 8.4–10.4)
Chloride: 105 mEq/L (ref 98–107)
Creatinine: 0.8 mg/dL (ref 0.6–1.1)
Glucose: 115 mg/dl — ABNORMAL HIGH (ref 70–99)
Total Bilirubin: 0.35 mg/dL (ref 0.20–1.20)

## 2012-11-01 MED ORDER — SODIUM CHLORIDE 0.9 % IV SOLN
Freq: Once | INTRAVENOUS | Status: AC
  Administered 2012-11-01: 16:00:00 via INTRAVENOUS

## 2012-11-01 MED ORDER — SODIUM CHLORIDE 0.9 % IJ SOLN
10.0000 mL | INTRAMUSCULAR | Status: DC | PRN
  Administered 2012-11-01: 10 mL
  Filled 2012-11-01: qty 10

## 2012-11-01 MED ORDER — PACLITAXEL PROTEIN-BOUND CHEMO INJECTION 100 MG
100.0000 mg/m2 | Freq: Once | INTRAVENOUS | Status: AC
  Administered 2012-11-01: 150 mg via INTRAVENOUS
  Filled 2012-11-01: qty 30

## 2012-11-01 MED ORDER — ONDANSETRON 8 MG/50ML IVPB (CHCC)
8.0000 mg | Freq: Once | INTRAVENOUS | Status: AC
  Administered 2012-11-01: 8 mg via INTRAVENOUS

## 2012-11-01 MED ORDER — HEPARIN SOD (PORK) LOCK FLUSH 100 UNIT/ML IV SOLN
500.0000 [IU] | Freq: Once | INTRAVENOUS | Status: AC | PRN
  Administered 2012-11-01: 500 [IU]
  Filled 2012-11-01: qty 5

## 2012-11-01 MED ORDER — DEXAMETHASONE SODIUM PHOSPHATE 10 MG/ML IJ SOLN
10.0000 mg | Freq: Once | INTRAMUSCULAR | Status: AC
  Administered 2012-11-01: 10 mg via INTRAVENOUS

## 2012-11-01 NOTE — Addendum Note (Signed)
Encounter addended by: Christine Poll Brannock, RN on: 11/01/2012  3:31 PM<BR>     Documentation filed: Visit Diagnoses, Charges VN

## 2012-11-01 NOTE — Telephone Encounter (Signed)
to see berry 1/16 and 1/30; no infusion 1/23; those are 30 min; see me feb 6 at 4 PM, 60 minuts, labs 2 days before that visit

## 2012-11-01 NOTE — Progress Notes (Signed)
ID: Debbie Larsen   DOB: 11-25-1950  MR#: [id number]  WUJ#:[id number]  PCP: Debbie Larsen GYN:  Debbie Larsen  OTHER Larsen: Debbie Larsen   HISTORY OF PRESENT ILLNESS: The patient's 2002-2004 records are being retrieved.  More recently, "Debbie Larsen" develop some right shoulder pain sometime in late 2012. She had had followup mammograms of until 2011. There was no mammogram performed in 2012. She was seen by physical therapy in orthopedic surgery for evaluation of her right shoulder pain. She had a mammogram on 07/24/2012 which showed a suspicious mass behind eczema of the right breast.  Physical exam the time did show multiple involved right axillary lymph nodes. 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 HER-2 nonamplified the ratio by CISH being 1.43. Proliferative index was  50%.   MRI scan of both breasts performed on 08/31/2012 showed multiple involved lymph nodes in the right axilla, no obvious masses in either breast. Unfortunately staging PET scan 09/12/2012 showed in addition to her right axillary recurrence, bilateral mediastinal and left lung nodules consistent with stage IV disease. Her subsequent history is as detailed below.  INTERVAL HISTORY: Debbie Larsen returns today for followup of her breast cancer. She is transitioning to my service. Today is day 1 of her third dose of every 2 week Abraxane  REVIEW OF SYSTEMS: In general, the patient seems to be tolerating treatment well. She tells me her legs are heavy and her eyelids are heavy, but she denies symptoms of peripheral neuropathy. She has minimal nausea, no vomiting. She is not taking anti-emetics other than using some ginger candy. "I can't use my right hand". Specifically she she cannot right or type with it. She denies unusual headaches, dizziness, gait imbalance, or visual changes. A detailed review of systems was otherwise noncontributory    PAST MEDICAL HISTORY: Past Medical History  Diagnosis Date  . 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 Hypothyroid   . Enlarged lymph nodes   . BREAST CANCER 04/2001    s/p radiation therapy, chemotherapy and lumpectomy    PAST SURGICAL HISTORY: Past Surgical History  Procedure Date  . Tonsillectomy   . Breast lumpectomy 04/2001    excision of L breast fibroadenoma by Dr.   . Breast lumpectomy 05/16/2001    Right axillary sentinel lymph node biopsy.  . Portacath placement 09/19/2012    Procedure: INSERTION PORT-A-CATH;  Surgeon: [name], Larsen;  Location: [facility] OR;  Service: General;  Laterality: N/A;    FAMILY HISTORY Family History  Problem Relation Age of Onset  . Diabetes Mother   . Hypertension Father   . Hypertension Sister   . Hypothyroidism Brother   . Cancer Neg Hx    the patient's father died from heart disease at the age of 69. The patient's mother died 2012-07-28 at the age of 30 also from heart disease. The patient has 2 sisters and 2 brothers. There is no history of breast or ovarian cancer in the family to her knowledge.  GYNECOLOGIC HISTORY: GX P1, first live birth age 7. She underwent menopause at the time of her chemotherapy in 2002. She did not use hormone replacement.  SOCIAL HISTORY: The patient is divorced and lives by herself. Her son Debbie Larsen lives in [place], where he works as an Publishing copy. The patient has no grandchildren. She attends the local [employer].   ADVANCED DIRECTIVES: The patient tells me (11/01/2012) her healthcare  power of attorney is her sister Debbie Larsen. She was unable to give me her contact information today  HEALTH MAINTENANCE: History  Substance Use Topics  . Smoking status: Never Smoker   . Smokeless tobacco: Never Used  . Alcohol Use: No     Colonoscopy: never  PAP:  Bone density: never  Lipid panel:  No Known Allergies  Current Outpatient Prescriptions  Medication Sig Dispense Refill  .  acetaminophen (TYLENOL) 500 MG tablet Take 500 mg by mouth every 4 (four) hours as needed. For pain.      Marland Kitchen aspirin 325 MG tablet Take 1 tablet (325 mg total) by mouth daily.  30 tablet  0  . cholecalciferol 2000 UNITS tablet Take 1 tablet (2,000 Units total) by mouth daily.      Marland Kitchen levothyroxine (SYNTHROID, LEVOTHROID) 75 MCG tablet Take 1 tablet (75 mcg total) by mouth daily. (dosage change)  30 tablet  1  . lidocaine-prilocaine (EMLA) cream Apply topically as needed. Apply to port 1-2 hours before procedure  30 g  0  . loratadine (CLARITIN) 10 MG tablet Take 1 tablet (10 mg total) by mouth daily.  30 tablet  11  . LORazepam (ATIVAN) 0.5 MG tablet Take 1 tablet (0.5 mg total) by mouth 2 (two) times daily as needed for anxiety.  30 tablet  1  . prochlorperazine (COMPAZINE) 10 MG tablet Take 1 tablet (10 mg total) by mouth every 6 (six) hours as needed.  30 tablet  2  . traMADol (ULTRAM) 50 MG tablet Take 1 tablet (50 mg total) by mouth every 8 (eight) hours as needed for pain.  30 tablet  1    OBJECTIVE: Middle aged [place] woman who appears anxious Filed Vitals:   11/01/12 1427  BP: 150/76  Pulse: 79  Temp: 97.9 F (36.6 C)  Resp: 20     Body mass index is 24.57 kg/(m^2).    ECOG FS: 2  Sclerae unicteric Oropharynx clear No cervical or supraclavicular adenopathy Lungs no rales or rhonchi, good excursion bilaterally Heart regular rate and rhythm, no murmur appreciated Abd benign MSK no focal spinal tenderness, no peripheral edema, decreased range of motion in the right upper extremity Neuro: Decreased grip on the right, otherwise nonfocal Breasts: I do not palpate any mass in the right breast, and there is no skin or nipple change. The right axilla appears full, without well circumscribed adenopathy. The left breast is unremarkable   LAB RESULTS: Lab Results  Component Value Date   WBC 5.6 10/18/2012   NEUTROABS 2.6 10/18/2012   HGB 11.4* 10/18/2012   HCT 33.4* 10/18/2012    MCV 85.6 10/18/2012   PLT 247 10/18/2012      Chemistry      Component Value Date/Time   NA 139 10/18/2012 1116   NA 138 09/18/2012 1531   K 3.9 10/18/2012 1116   K 3.7 09/18/2012 1531   CL 107 10/18/2012 1116   CL 103 09/18/2012 1531   CO2 24 10/18/2012 1116   CO2 23 09/18/2012 1531   BUN 9.0 10/18/2012 1116   BUN 9 09/18/2012 1531   CREATININE 0.8 10/18/2012 1116   CREATININE 0.70 09/18/2012 1531   CREATININE 0.74 03/01/2012 1715      Component Value Date/Time   CALCIUM 9.1 10/18/2012 1116   CALCIUM 9.8 09/18/2012 1531   ALKPHOS 80 10/18/2012 1116   ALKPHOS 81 09/18/2012 1531   AST 13 10/18/2012 1116   AST 18 09/18/2012 1531   ALT 11  10/18/2012 1116   ALT 13 09/18/2012 1531   BILITOT 0.28 10/18/2012 1116   BILITOT 0.3 09/18/2012 1531       Lab Results  Component Value Date   LABCA2 34 10/04/2012    No components found with this basename: [id number]    No results found for this basename: INR:1;PROTIME:1 in the last 168 hours  Urinalysis    Component Value Date/Time   COLORURINE YELLOW 05/11/2012 1538   APPEARANCEUR CLEAR 05/11/2012 1538   LABSPEC 1.009 05/11/2012 1538   PHURINE 6.5 05/11/2012 1538   GLUCOSEU NEGATIVE 05/11/2012 1538   HGBUR NEGATIVE 05/11/2012 1538   HGBUR negative 04/09/2010 1512   BILIRUBINUR NEGATIVE 05/11/2012 1538   KETONESUR NEGATIVE 05/11/2012 1538   PROTEINUR NEGATIVE 05/11/2012 1538   UROBILINOGEN 0.2 05/11/2012 1538   NITRITE NEGATIVE 05/11/2012 1538   LEUKOCYTESUR NEGATIVE 05/11/2012 1538    STUDIES: No results found.  ASSESSMENT: 62 y.o. [place] woman originally from [place] with stage IV breast cancer  (1) status post right lumpectomy and sentinel lymph node sampling 05/16/2001 for a pT1c pN1, stage IIA invasive ductal carcinoma, grade 2, estrogen receptor 95% and progesterone receptor 96% positive, with an MIB-1 of 11% and no HER-2 amplification by immunocytochemistry.  (2) treated adjuvantly with cyclophosphamide, methotrexate and  fluorouracil, likely for 8 cycles (data unavailable), completed February 2003  (3) completed radiation to the right breast and regional lymph nodes 02/13/1999  (4) the patient apparently refused completion axillary lymph node dissection and antiestrogen therapy (data unavailable).  (5) recurrent disease documented by right axillary lymph node biopsy 08/23/2012, showing a high-grade invasive ductal carcinoma which was estrogen and progesterone receptor negative, with an MIB-1 of 59%, and no HER-2 amplification; staging studies show hyper metabolic mediastinal and left lung lesions, with no involvement of the bones, liver or brain  (6) Abraxane every 2 weeks started 09/20/2012, with good tolerance and initial evidence of response.   PLAN: The patient tells me she did not know she had stage IV breast cancer, and that stage IV breast cancer is incurable. Staff tells me these facts have been discussed with her in the past. I gave her again the information in writing and explained that although we do not know how to cure stage IV breast cancer we do have many ways of treating it.  Currently she seems to be tolerating the Abraxane well, and I think it would be reasonable for her to receive 3 more doses and then get restaged with a repeat PET scan. I do not believe she would benefit from an axillary lymph node dissection in the setting of metastatic disease. Likely this would only make her right arm problems worse.  The patient expresses an interest in clinical trials. After her next PET scan we will refer her to [facility] so she may be able to explore of that option more fully. We do have 2 trials available here for triple-negative disease and we will explore whether she may qualify for those.  She will see my physician's assistant with her next 2 doses of Abraxane, and then will see me again in February 6, with a PET scan 2 days prior to that visit.   MAGRINAT,GUSTAV C    11/01/2012

## 2012-11-01 NOTE — Patient Instructions (Addendum)
[facility] Discharge Instructions for Patients Receiving Chemotherapy  Today you received the following chemotherapy agents Abraxane.  To help prevent nausea and vomiting after your treatment, we encourage you to take your nausea medication.  If you develop nausea and vomiting that is not controlled by your nausea medication, call the clinic. If it is after clinic hours your family physician or the after hours number for the clinic or go to the Emergency Department.   BELOW ARE SYMPTOMS THAT SHOULD BE REPORTED IMMEDIATELY:  *FEVER GREATER THAN 100.5 F  *CHILLS WITH OR WITHOUT FEVER  NAUSEA AND VOMITING THAT IS NOT CONTROLLED WITH YOUR NAUSEA MEDICATION  *UNUSUAL SHORTNESS OF BREATH  *UNUSUAL BRUISING OR BLEEDING  TENDERNESS IN MOUTH AND THROAT WITH OR WITHOUT PRESENCE OF ULCERS  *URINARY PROBLEMS  *BOWEL PROBLEMS  UNUSUAL RASH Items with * indicate a potential emergency and should be followed up as soon as possible.  One of the nurses will contact you 24 hours after your treatment. Please let the nurse know about any problems that you may have experienced. Feel free to call the clinic you have any questions or concerns. The clinic phone number is [phone].   I have been informed and understand all the instructions given to me. I know to contact the clinic, my physician, or go to the Emergency Department if any problems should occur. I do not have any questions at this time, but understand that I may call the clinic during office hours   should I have any questions or need assistance in obtaining follow up care.    __________________________________________  _____________  __________ Signature of Patient or Authorized Representative            Date                   Time    __________________________________________ Nurse's Signature    

## 2012-11-01 NOTE — Telephone Encounter (Signed)
na

## 2012-11-02 ENCOUNTER — Telehealth: Payer: Self-pay | Admitting: *Deleted

## 2012-11-02 NOTE — Telephone Encounter (Signed)
Per staff message I have adjusted appts. JM W 

## 2012-11-03 ENCOUNTER — Encounter: Payer: Self-pay | Admitting: Oncology

## 2012-11-04 ENCOUNTER — Other Ambulatory Visit: Payer: Self-pay | Admitting: Oncology

## 2012-11-07 ENCOUNTER — Other Ambulatory Visit: Payer: Self-pay | Admitting: Family Medicine

## 2012-11-07 DIAGNOSIS — E039 Hypothyroidism, unspecified: Secondary | ICD-10-CM

## 2012-11-07 MED ORDER — LEVOTHYROXINE SODIUM 75 MCG PO TABS: 75.0000 ug | ORAL_TABLET | Freq: Every day | ORAL | Status: DC

## 2012-11-08 ENCOUNTER — Telehealth: Payer: Self-pay | Admitting: Oncology

## 2012-11-08 ENCOUNTER — Other Ambulatory Visit: Payer: Self-pay | Admitting: Oncology

## 2012-11-08 ENCOUNTER — Encounter: Payer: Self-pay | Admitting: Internal Medicine

## 2012-11-08 DIAGNOSIS — C50919 Malignant neoplasm of unspecified site of unspecified female breast: Secondary | ICD-10-CM

## 2012-11-08 NOTE — Telephone Encounter (Signed)
Pt called asking about more clinical trials.   She said she wants a Brain MRI stating she has dizziness.   I will relay her request.   We reviewed the same information about her stage and how she was diagnosis and her treatment.   I also sent a request to medical records to set up a second opinion at [facility].   We reviewed the same information over and over.

## 2012-11-09 ENCOUNTER — Other Ambulatory Visit: Payer: Self-pay | Admitting: Oncology

## 2012-11-09 ENCOUNTER — Ambulatory Visit: Payer: Medicaid Other | Admitting: Internal Medicine

## 2012-11-11 ENCOUNTER — Other Ambulatory Visit: Payer: Self-pay | Admitting: Oncology

## 2012-11-11 NOTE — Progress Notes (Signed)
ID: Debbie Larsen   DOB: [birth year]  MR#: [id number]  NWG#:[id number]  PCP: [name], MD GYN:  SUCicero Duck  OTHER MD: Nicholes Mango, Chipper Herb   HISTORY OF PRESENT ILLNESS: The patient's 2002-2004 records have been retrieved. The sugar the patient underwent a right breast core biopsy 03/29/2001 for an invasive ductal carcinoma associated with necrosis ([id number]). 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 The tumor was estrogen receptor 95% positive, progesterone receptor 96% positive, with an MIB-1 of 11%, and no HER-2 amplification by the HercepTest. She completed 6 cycles of CMF chemotherapy on 12/12/2001. There were minor delays, but no dose reductions. She received radiation to the right breast and regional lymph nodes to a total of 5040 cGy with a right breast boost of 12 [name], completed 02/12/2002  More recently, "Debbie Larsen" develop some right shoulder pain sometime in late 2012. She had had followup mammograms of until 2011. There was no mammogram performed in 2012. She was seen by physical therapy in orthopedic surgery for evaluation of her right shoulder pain. She had a mammogram on 07/24/2012 which showed a suspicious mass behind eczema of the right breast.  Physical exam the time did show multiple involved right axillary lymph nodes. 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 HER-2 nonamplified the ratio by CISH being 1.43. Proliferative index was  50%.   MRI scan of both breasts performed on 08/31/2012 showed multiple involved lymph nodes in the right axilla, no obvious masses in either breast. Unfortunately staging PET scan 09/12/2012 showed in addition to her right axillary recurrence, bilateral mediastinal and left lung nodules consistent  with stage IV disease. Her subsequent history is as detailed below.  INTERVAL HISTORY: Debbie Larsen returns today for followup of her breast cancer. She is transitioning to my service. Today is day 1 of her third dose of every 2 week Abraxane  REVIEW OF SYSTEMS: In general, the patient seems to be tolerating treatment well. She tells me her legs are heavy and her eyelids are heavy, but she denies symptoms of peripheral neuropathy. She has minimal nausea, no vomiting. She is not taking anti-emetics other than using some ginger candy. "I can't use my right hand". Specifically she she cannot right or type with it. She denies unusual headaches, dizziness, gait imbalance, or visual changes. A detailed review of systems was otherwise noncontributory   PAST MEDICAL HISTORY: Past Medical History  Diagnosis Date  . 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 Hypothyroid   . Enlarged lymph nodes   . BREAST CANCER 04/2001    s/p radiation therapy, chemotherapy and lumpectomy    PAST SURGICAL HISTORY: Past Surgical History  Procedure Date  . Tonsillectomy   . Breast lumpectomy 04/2001    excision of L breast fibroadenoma by Dr.   . Breast lumpectomy 05/16/2001    Right axillary sentinel lymph node biopsy.  . Portacath placement 09/19/2012    Procedure: INSERTION PORT-A-CATH;  Surgeon: [name], MD;  Location: [facility] OR;  Service: General;  Laterality: N/A;    FAMILY HISTORY Family History  Problem Relation Age of Onset  . Diabetes Mother   . Hypertension Father   . Hypertension Sister   . Hypothyroidism Brother   . Cancer Neg Hx    the patient's father died from heart disease  at the age of 86. The patient's mother died Jul 29, 2012 at the age of 40 also from heart disease. The patient has 2 sisters and 2 brothers. There is no history of breast or ovarian cancer in the family to her knowledge.  GYNECOLOGIC HISTORY: GX P1, first live birth age 62. She underwent menopause at the time of her  chemotherapy in 2002. She did not use hormone replacement.  SOCIAL HISTORY: The patient is divorced and lives by herself. Her son Keylin Ferryman lives in [place], where he works as an Publishing copy. The patient has no grandchildren. She attends the local [employer].   ADVANCED DIRECTIVES: The patient tells me (11/01/2012) her healthcare power of attorney is her sister Angeleah Labrake. She was unable to give me her contact information today  HEALTH MAINTENANCE: History  Substance Use Topics  . Smoking status: Never Smoker   . Smokeless tobacco: Never Used  . Alcohol Use: No     Colonoscopy: never  PAP:  Bone density: never  Lipid panel:  No Known Allergies  Current Outpatient Prescriptions  Medication Sig Dispense Refill  . acetaminophen (TYLENOL) 500 MG tablet Take 500 mg by mouth every 4 (four) hours as needed. For pain.      Marland Kitchen aspirin 325 MG tablet Take 1 tablet (325 mg total) by mouth daily.  30 tablet  0  . cholecalciferol 2000 UNITS tablet Take 1 tablet (2,000 Units total) by mouth daily.      Marland Kitchen levothyroxine (SYNTHROID, LEVOTHROID) 75 MCG tablet Take 1 tablet (75 mcg total) by mouth daily. (dosage change)  90 tablet  1  . lidocaine-prilocaine (EMLA) cream Apply topically as needed. Apply to port 1-2 hours before procedure  30 g  0  . loratadine (CLARITIN) 10 MG tablet Take 1 tablet (10 mg total) by mouth daily.  30 tablet  11  . LORazepam (ATIVAN) 0.5 MG tablet Take 1 tablet (0.5 mg total) by mouth 2 (two) times daily as needed for anxiety.  30 tablet  1  . prochlorperazine (COMPAZINE) 10 MG tablet Take 1 tablet (10 mg total) by mouth every 6 (six) hours as needed.  30 tablet  2  . traMADol (ULTRAM) 50 MG tablet Take 1 tablet (50 mg total) by mouth every 8 (eight) hours as needed for pain.  30 tablet  1    OBJECTIVE: Middle aged [place] woman who appears anxious There were no vitals filed for this visit.   There is no height or weight on file to calculate BMI.    ECOG FS:  2  Sclerae unicteric Oropharynx clear No cervical or supraclavicular adenopathy Lungs no rales or rhonchi, good excursion bilaterally Heart regular rate and rhythm, no murmur appreciated Abd benign MSK no focal spinal tenderness, no peripheral edema, decreased range of motion in the right upper extremity Neuro: Decreased grip on the right, otherwise nonfocal Breasts: I do not palpate any mass in the right breast, and there is no skin or nipple change. The right axilla appears full, without well circumscribed adenopathy. The left breast is unremarkable   LAB RESULTS: Lab Results  Component Value Date   WBC 5.7 11/01/2012   NEUTROABS 2.7 11/01/2012   HGB 11.5* 11/01/2012   HCT 34.1* 11/01/2012   MCV 82.6 11/01/2012   PLT 250 11/01/2012      Chemistry      Component Value Date/Time   NA 139 11/01/2012 1413   NA 138 09/18/2012 1531   K 4.1 11/01/2012 1413  K 3.7 09/18/2012 1531   CL 105 11/01/2012 1413   CL 103 09/18/2012 1531   CO2 23 11/01/2012 1413   CO2 23 09/18/2012 1531   BUN 11.0 11/01/2012 1413   BUN 9 09/18/2012 1531   CREATININE 0.8 11/01/2012 1413   CREATININE 0.70 09/18/2012 1531   CREATININE 0.74 03/01/2012 1715      Component Value Date/Time   CALCIUM 9.6 11/01/2012 1413   CALCIUM 9.8 09/18/2012 1531   ALKPHOS 88 11/01/2012 1413   ALKPHOS 81 09/18/2012 1531   AST 13 11/01/2012 1413   AST 18 09/18/2012 1531   ALT 10 11/01/2012 1413   ALT 13 09/18/2012 1531   BILITOT 0.35 11/01/2012 1413   BILITOT 0.3 09/18/2012 1531       Lab Results  Component Value Date   LABCA2 34 10/04/2012    No components found with this basename: [id number]    No results found for this basename: INR:1;PROTIME:1 in the last 168 hours  Urinalysis    Component Value Date/Time   COLORURINE YELLOW 05/11/2012 1538   APPEARANCEUR CLEAR 05/11/2012 1538   LABSPEC 1.009 05/11/2012 1538   PHURINE 6.5 05/11/2012 1538   GLUCOSEU NEGATIVE 05/11/2012 1538   HGBUR NEGATIVE 05/11/2012 1538   HGBUR negative 04/09/2010  1512   BILIRUBINUR NEGATIVE 05/11/2012 1538   KETONESUR NEGATIVE 05/11/2012 1538   PROTEINUR NEGATIVE 05/11/2012 1538   UROBILINOGEN 0.2 05/11/2012 1538   NITRITE NEGATIVE 05/11/2012 1538   LEUKOCYTESUR NEGATIVE 05/11/2012 1538    STUDIES: No results found.  ASSESSMENT: 62 y.o. [place] woman originally from [place] with stage IV breast cancer  (1) status post right lumpectomy and sentinel lymph node sampling 05/16/2001 for a pT1c pN1, stage IIA invasive ductal carcinoma, grade 2, estrogen receptor 95% and progesterone receptor 96% positive, with an MIB-1 of 11% and no HER-2 amplification by immunocytochemistry.  (2) treated adjuvantly with cyclophosphamide, methotrexate and fluorouracil, likely for 8 cycles (data unavailable), completed February 2003  (3) completed radiation to the right breast and regional lymph nodes 02/13/1999  (4) the patient apparently refused completion axillary lymph node dissection and antiestrogen therapy (data unavailable).  (5) recurrent disease documented by right axillary lymph node biopsy 08/23/2012, showing a high-grade invasive ductal carcinoma which was estrogen and progesterone receptor negative, with an MIB-1 of 59%, and no HER-2 amplification; staging studies show hyper metabolic mediastinal and left lung lesions, with no involvement of the bones, liver or brain  (6) Abraxane every 2 weeks started 09/20/2012, with good tolerance and initial evidence of response.   PLAN: The patient tells me she did not know she had stage IV breast cancer, and that stage IV breast cancer is incurable. Staff tells me these facts have been discussed with her in the past. I gave her again the information in writing and explained that although we do not know how to cure stage IV breast cancer we do have many ways of treating it.  Currently she seems to be tolerating the Abraxane well, and I think it would be reasonable for her to receive 3 more doses and then get restaged with  a repeat PET scan. I do not believe she would benefit from an axillary lymph node dissection in the setting of metastatic disease. Likely this would only make her right arm problems worse.  The patient expresses an interest in clinical trials. After her next PET scan we will refer her to [facility] so she may be able to explore of that option more fully. We do  have 2 trials available here for triple-negative disease and we will explore whether she may qualify for those.  She will see my physician's assistant with her next 2 doses of Abraxane, and then will see me again in February 6, with a PET scan 2 days prior to that visit.   Ardythe Klute C    11/11/2012

## 2012-11-14 ENCOUNTER — Other Ambulatory Visit: Payer: Self-pay | Admitting: Physician Assistant

## 2012-11-14 ENCOUNTER — Telehealth: Payer: Self-pay | Admitting: *Deleted

## 2012-11-14 DIAGNOSIS — C50919 Malignant neoplasm of unspecified site of unspecified female breast: Secondary | ICD-10-CM

## 2012-11-14 NOTE — Telephone Encounter (Signed)
Looked at patient's schedule the scan has been scheduled

## 2012-11-15 ENCOUNTER — Ambulatory Visit (HOSPITAL_BASED_OUTPATIENT_CLINIC_OR_DEPARTMENT_OTHER): Payer: Medicaid Other | Admitting: Physician Assistant

## 2012-11-15 ENCOUNTER — Ambulatory Visit (HOSPITAL_BASED_OUTPATIENT_CLINIC_OR_DEPARTMENT_OTHER): Payer: Medicaid Other

## 2012-11-15 ENCOUNTER — Other Ambulatory Visit (HOSPITAL_BASED_OUTPATIENT_CLINIC_OR_DEPARTMENT_OTHER): Payer: Medicaid Other | Admitting: Lab

## 2012-11-15 ENCOUNTER — Encounter: Payer: Self-pay | Admitting: Physician Assistant

## 2012-11-15 VITALS — BP 136/84 | HR 85 | Temp 98.1°F | Resp 20 | Ht 60.0 in | Wt 123.4 lb

## 2012-11-15 DIAGNOSIS — C773 Secondary and unspecified malignant neoplasm of axilla and upper limb lymph nodes: Secondary | ICD-10-CM

## 2012-11-15 DIAGNOSIS — C50919 Malignant neoplasm of unspecified site of unspecified female breast: Secondary | ICD-10-CM

## 2012-11-15 DIAGNOSIS — M25519 Pain in unspecified shoulder: Secondary | ICD-10-CM

## 2012-11-15 DIAGNOSIS — C50219 Malignant neoplasm of upper-inner quadrant of unspecified female breast: Secondary | ICD-10-CM

## 2012-11-15 DIAGNOSIS — M25511 Pain in right shoulder: Secondary | ICD-10-CM

## 2012-11-15 DIAGNOSIS — C7800 Secondary malignant neoplasm of unspecified lung: Secondary | ICD-10-CM

## 2012-11-15 DIAGNOSIS — Z170 Estrogen receptor positive status [ER+]: Secondary | ICD-10-CM

## 2012-11-15 DIAGNOSIS — Z5111 Encounter for antineoplastic chemotherapy: Secondary | ICD-10-CM

## 2012-11-15 LAB — CBC WITH DIFFERENTIAL/PLATELET
BASO%: 0.6 % (ref 0.0–2.0)
EOS%: 0.7 % (ref 0.0–7.0)
MCH: 28.4 pg (ref 25.1–34.0)
MCHC: 34.4 g/dL (ref 31.5–36.0)
MONO#: 0.7 10*3/uL (ref 0.1–0.9)
RDW: 14.8 % — ABNORMAL HIGH (ref 11.2–14.5)
WBC: 5.4 10*3/uL (ref 3.9–10.3)
lymph#: 2 10*3/uL (ref 0.9–3.3)

## 2012-11-15 LAB — COMPREHENSIVE METABOLIC PANEL (CC13)
ALT: 9 U/L (ref 0–55)
AST: 11 U/L (ref 5–34)
Albumin: 3.8 g/dL (ref 3.5–5.0)
CO2: 23 mEq/L (ref 22–29)
Calcium: 9.4 mg/dL (ref 8.4–10.4)
Chloride: 105 mEq/L (ref 98–107)
Potassium: 4.1 mEq/L (ref 3.5–5.1)
Sodium: 137 mEq/L (ref 136–145)
Total Protein: 7.4 g/dL (ref 6.4–8.3)

## 2012-11-15 MED ORDER — ONDANSETRON 8 MG/50ML IVPB (CHCC)
8.0000 mg | Freq: Once | INTRAVENOUS | Status: AC
  Administered 2012-11-15: 8 mg via INTRAVENOUS

## 2012-11-15 MED ORDER — SODIUM CHLORIDE 0.9 % IJ SOLN
10.0000 mL | INTRAMUSCULAR | Status: DC | PRN
  Filled 2012-11-15: qty 10

## 2012-11-15 MED ORDER — HEPARIN SOD (PORK) LOCK FLUSH 100 UNIT/ML IV SOLN
500.0000 [IU] | Freq: Once | INTRAVENOUS | Status: DC | PRN
  Filled 2012-11-15: qty 5

## 2012-11-15 MED ORDER — DEXAMETHASONE SODIUM PHOSPHATE 10 MG/ML IJ SOLN
10.0000 mg | Freq: Once | INTRAMUSCULAR | Status: AC
  Administered 2012-11-15: 10 mg via INTRAVENOUS

## 2012-11-15 MED ORDER — SODIUM CHLORIDE 0.9 % IV SOLN
Freq: Once | INTRAVENOUS | Status: AC
  Administered 2012-11-15: 15:00:00 via INTRAVENOUS

## 2012-11-15 MED ORDER — PACLITAXEL PROTEIN-BOUND CHEMO INJECTION 100 MG
100.0000 mg/m2 | Freq: Once | INTRAVENOUS | Status: AC
  Administered 2012-11-15: 150 mg via INTRAVENOUS
  Filled 2012-11-15: qty 30

## 2012-11-15 NOTE — Patient Instructions (Addendum)
[facility] Discharge Instructions for Patients Receiving Chemotherapy  Today you received the following chemotherapy agents abraxane  To help prevent nausea and vomiting after your treatment, we encourage you to take your nausea medication  and take it as often as prescribed   If you develop nausea and vomiting that is not controlled by your nausea medication, call the clinic. If it is after clinic hours your family physician or the after hours number for the clinic or go to the Emergency Department.   BELOW ARE SYMPTOMS THAT SHOULD BE REPORTED IMMEDIATELY:  *FEVER GREATER THAN 100.5 F  *CHILLS WITH OR WITHOUT FEVER  NAUSEA AND VOMITING THAT IS NOT CONTROLLED WITH YOUR NAUSEA MEDICATION  *UNUSUAL SHORTNESS OF BREATH  *UNUSUAL BRUISING OR BLEEDING  TENDERNESS IN MOUTH AND THROAT WITH OR WITHOUT PRESENCE OF ULCERS  *URINARY PROBLEMS  *BOWEL PROBLEMS  UNUSUAL RASH Items with * indicate a potential emergency and should be followed up as soon as possible.  One of the nurses will contact you 24 hours after your treatment. Please let the nurse know about any problems that you may have experienced. Feel free to call the clinic you have any questions or concerns. The clinic phone number is [phone].   I have been informed and understand all the instructions given to me. I know to contact the clinic, my physician, or go to the Emergency Department if any problems should occur. I do not have any questions at this time, but understand that I may call the clinic during office hours   should I have any questions or need assistance in obtaining follow up care.    __________________________________________  _____________  __________ Signature of Patient or Authorized Representative            Date                   Time    __________________________________________ Nurse's Signature    

## 2012-11-15 NOTE — Progress Notes (Signed)
ID: [name]   DOB: 05-Dec-1950  MR#: [id number]  WUJ#:[id number]  PCP: [name], MD GYN:  SUCicero Duck  OTHER MD: Nicholes Mango, Chipper Herb   HISTORY OF PRESENT ILLNESS: The patient's 2002-2004 records have been retrieved. The sugar the patient underwent a right breast core biopsy 03/29/2001 for an invasive ductal carcinoma associated with necrosis ([id number]). 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 The tumor was estrogen receptor 95% positive, progesterone receptor 96% positive, with an MIB-1 of 11%, and no HER-2 amplification by the HercepTest. She completed 6 cycles of CMF chemotherapy on 12/12/2001. There were minor delays, but no dose reductions. She received radiation to the right breast and regional lymph nodes to a total of 5040 cGy with a right breast boost of 12 [name], completed 02/12/2002  More recently, "Debbie Larsen" develop some right shoulder pain sometime in late 2012. She had had followup mammograms of until 2011. There was no mammogram performed in 2012. She was seen by physical therapy in orthopedic surgery for evaluation of her right shoulder pain. She had a mammogram on 07/24/2012 which showed a suspicious mass behind eczema of the right breast.  Physical exam the time did show multiple involved right axillary lymph nodes. 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 HER-2 nonamplified the ratio by C[facility] being 1.43. Proliferative index was  50%.   MRI scan of both breasts performed on 08/31/2012 showed multiple involved lymph nodes in the right axilla, no obvious masses in either breast. Unfortunately staging PET scan 09/12/2012 showed in addition to her right axillary recurrence, bilateral mediastinal and left lung nodules consistent  with stage IV disease. Her subsequent history is as detailed below.  INTERVAL HISTORY: Debbie Larsen returns today for followup of her metastatic breast cancer. She continues to receive Abraxane every 2 weeks and is due for her fifth dose today.  Interval history is generally unremarkable, and with the exception of some mild nausea, Debbie Larsen continues to tolerate the treatment extremely well. Specifically, she has had no emesis, no mouth ulcers, and he denies any signs of peripheral neuropathy.  REVIEW OF SYSTEMS: Debbie Larsen denies fevers or chills. She's had no skin changes and denies abnormal bleeding. She continues to have weakness in the right upper extremity with limited range of motion. She denies any new or unusual myalgias, arthralgias, or bony pain. She's having regular bowel movements. She's had no increased shortness of breath and has an occasional nonproductive cough. No hemoptysis. She denies chest pain or palpitations. She's had no abnormal headaches, dizziness, change in vision, gait imbalance, or seizure activity.  A detailed review of systems is otherwise stable and noncontributory.   PAST MEDICAL HISTORY: Past Medical History  Diagnosis Date  . 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 Hypothyroid   . Enlarged lymph nodes   . BREAST CANCER 04/2001    s/p radiation therapy, chemotherapy and lumpectomy    PAST SURGICAL HISTORY: Past Surgical History  Procedure Date  . Tonsillectomy   . Breast lumpectomy 04/2001    excision of L breast fibroadenoma by Dr.   . Breast lumpectomy 05/16/2001    Right axillary sentinel lymph node biopsy.  . Portacath placement 09/19/2012    Procedure: INSERTION PORT-A-CATH;  Surgeon: [name], MD;  Location: [facility] OR;  Service: General;  Laterality:  N/A;    FAMILY HISTORY Family History  Problem Relation Age of Onset  . Diabetes Mother   . Hypertension Father   . Hypertension Sister   . Hypothyroidism Brother   . Cancer Neg Hx    the patient's father  died from heart disease at the age of 59. The patient's mother died 07-Aug-2012 at the age of 19 also from heart disease. The patient has 2 sisters and 2 brothers. There is no history of breast or ovarian cancer in the family to her knowledge.  GYNECOLOGIC HISTORY: GX P1, first live birth age 66. She underwent menopause at the time of her chemotherapy in 2002. She did not use hormone replacement.  SOCIAL HISTORY: The patient is divorced and lives by herself. Her son Micheala Morissette lives in [place], where he works as an Publishing copy. The patient has no grandchildren. She attends the local [employer].   ADVANCED DIRECTIVES: The patient tells me (11/01/2012) her healthcare power of attorney is her sister Beyonca Wisz. She was unable to give me her contact information today  HEALTH MAINTENANCE: History  Substance Use Topics  . Smoking status: Never Smoker   . Smokeless tobacco: Never Used  . Alcohol Use: No     Colonoscopy: never  PAP:  Bone density: never  Lipid panel:  No Known Allergies  Current Outpatient Prescriptions  Medication Sig Dispense Refill  . acetaminophen (TYLENOL) 500 MG tablet Take 500 mg by mouth every 4 (four) hours as needed. For pain.      Marland Kitchen aspirin 325 MG tablet Take 1 tablet (325 mg total) by mouth daily.  30 tablet  0  . cholecalciferol 2000 UNITS tablet Take 1 tablet (2,000 Units total) by mouth daily.      Marland Kitchen levothyroxine (SYNTHROID, LEVOTHROID) 75 MCG tablet Take 1 tablet (75 mcg total) by mouth daily. (dosage change)  90 tablet  1  . lidocaine-prilocaine (EMLA) cream Apply topically as needed. Apply to port 1-2 hours before procedure  30 g  0  . loratadine (CLARITIN) 10 MG tablet Take 1 tablet (10 mg total) by mouth daily.  30 tablet  11  . LORazepam (ATIVAN) 0.5 MG tablet Take 1 tablet (0.5 mg total) by mouth 2 (two) times daily as needed for anxiety.  30 tablet  1  . prochlorperazine (COMPAZINE) 10 MG tablet Take 1 tablet (10 mg total) by mouth every 6  (six) hours as needed.  30 tablet  2  . traMADol (ULTRAM) 50 MG tablet Take 1 tablet (50 mg total) by mouth every 8 (eight) hours as needed for pain.  30 tablet  1    OBJECTIVE: Middle aged [place] woman who appears anxious Filed Vitals:   11/15/12 1354  BP: 136/84  Pulse: 85  Temp: 98.1 F (36.7 C)  Resp: 20     Body mass index is 24.10 kg/(m^2).    ECOG FS: 2  Sclerae unicteric Oropharynx clear No cervical or supraclavicular adenopathy Lungs no rales or rhonchi, good excursion bilaterally Heart regular rate and rhythm, no murmur appreciated Abdomen benign, positive bowel sounds MSK no focal spinal tenderness, no peripheral edema, decreased range of motion in the right upper extremity Neuro: Decreased grip on the right, strength 3/5 in the right upper extremity, 5 out of 5 in the left. Otherwise nonfocal, alert and oriented x3 Breasts: Deferred. Fullness and thickening is palpated in the right axilla, but no frank adenopathy is noted.    LAB RESULTS: Today's labs are  pending, 11/15/2012.  Lab Results  Component Value Date   WBC 5.7 11/01/2012   NEUTROABS 2.7 11/01/2012   HGB 11.5* 11/01/2012   HCT 34.1* 11/01/2012   MCV 82.6 11/01/2012   PLT 250 11/01/2012      Chemistry      Component Value Date/Time   NA 139 11/01/2012 1413   NA 138 09/18/2012 1531   K 4.1 11/01/2012 1413   K 3.7 09/18/2012 1531   CL 105 11/01/2012 1413   CL 103 09/18/2012 1531   CO2 23 11/01/2012 1413   CO2 23 09/18/2012 1531   BUN 11.0 11/01/2012 1413   BUN 9 09/18/2012 1531   CREATININE 0.8 11/01/2012 1413   CREATININE 0.70 09/18/2012 1531   CREATININE 0.74 03/01/2012 1715      Component Value Date/Time   CALCIUM 9.6 11/01/2012 1413   CALCIUM 9.8 09/18/2012 1531   ALKPHOS 88 11/01/2012 1413   ALKPHOS 81 09/18/2012 1531   AST 13 11/01/2012 1413   AST 18 09/18/2012 1531   ALT 10 11/01/2012 1413   ALT 13 09/18/2012 1531   BILITOT 0.35 11/01/2012 1413   BILITOT 0.3 09/18/2012 1531       Lab Results  Component  Value Date   LABCA2 34 10/04/2012     STUDIES: No results found.   ASSESSMENT: 62 y.o. [place] woman originally from [place] with stage IV breast cancer  (1) status post right lumpectomy and sentinel lymph node sampling 05/16/2001 for a pT1c pN1, stage IIA invasive ductal carcinoma, grade 2, estrogen receptor 95% and progesterone receptor 96% positive, with an MIB-1 of 11% and no HER-2 amplification by immunocytochemistry.  (2) treated adjuvantly with cyclophosphamide, methotrexate and fluorouracil, likely for 8 cycles (data unavailable), completed February 2003  (3) completed radiation to the right breast and regional lymph nodes 02/13/1999  (4) the patient apparently refused completion axillary lymph node dissection and antiestrogen therapy (data unavailable).  (5) recurrent disease documented by right axillary lymph node biopsy 08/23/2012, showing a high-grade invasive ductal carcinoma which was estrogen and progesterone receptor negative, with an MIB-1 of 59%, and no HER-2 amplification; staging studies show hyper metabolic mediastinal and left lung lesions, with no involvement of the bones, liver or brain  (6) Abraxane every 2 weeks started 09/20/2012, with good tolerance and initial evidence of response.   PLAN:  Over half of our 45 minute appointment today was spent again counseling the patient regarding her diagnosis of stage IV breast cancer, reviewing her treatment plan, and coordinating care.  She'll proceed to treatment today as scheduled for her fifth every 2 week dose of Abraxane. She will have her labs drawn with port access in the infusion room. She's also interested in speaking to our genetics counselor, and I will try to organize that as well.   Debbie Larsen had originally been scheduled for restaging scans following her sixth dose, but does not want to wait, and has already scheduled dose scans for January 28, just prior to dose 6 of Abraxane on January 30. She'll be seeing Dr.  Darnelle Catalan that time to review those results and review her treatment plan once again. I will mention that she is also scheduled to see her surgeon, Dr. Jamey Ripa, following the scans in the 28th.  The patient expresses an interest in clinical trials. After her next PET scan we will refer her to [facility] so she may be able to explore of that option more fully. Unfortunately we do not currently have a study available here that  Ms. Pettinato for which would qualify.   Patient voices understanding and agreement with this plan, and will call with any changes or problems.  Kieth Hartis    11/15/2012

## 2012-11-16 ENCOUNTER — Telehealth: Payer: Self-pay | Admitting: Oncology

## 2012-11-16 NOTE — Telephone Encounter (Signed)
CALLED PT TO ADVISE 1/30 APPT TIME HAS BEEN CHANGED FROM 9.15 TO 10.15 DUE TO HER NEEDING TO REVIEW SCANS WITH MD. ALSO GAVE PT APPT FOR GENETICS ON 2/3 AT 1PM AND ADVISED HER 2/4 LABS WILL BE DRAWN TON 2/3.

## 2012-11-20 ENCOUNTER — Encounter (INDEPENDENT_AMBULATORY_CARE_PROVIDER_SITE_OTHER): Payer: Medicaid Other | Admitting: Surgery

## 2012-11-20 ENCOUNTER — Telehealth: Payer: Self-pay | Admitting: Oncology

## 2012-11-20 NOTE — Telephone Encounter (Signed)
Per staff message from Clydie Braun moved 2/3 appt due to not enough time. appt moved to next available 3/17. S/w pt today re change w/new d/t for 3/17 @ 1pm - time per pt. Pt aware she will keep 2/3 lb as this was also a lb to be done prior to 2/6 appt. Pt will get schedule when she comes in 1/30.

## 2012-11-21 ENCOUNTER — Telehealth: Payer: Self-pay | Admitting: Oncology

## 2012-11-21 NOTE — Telephone Encounter (Signed)
Pt appt @ [facility] 12/12/12. Medicals records faxed, slides and scans will be fedex'ed. Pt is aware

## 2012-11-22 ENCOUNTER — Ambulatory Visit: Payer: Self-pay

## 2012-11-22 ENCOUNTER — Other Ambulatory Visit: Payer: Self-pay | Admitting: Lab

## 2012-11-22 ENCOUNTER — Ambulatory Visit: Payer: Self-pay | Admitting: Oncology

## 2012-11-27 ENCOUNTER — Encounter (HOSPITAL_COMMUNITY)
Admission: RE | Admit: 2012-11-27 | Discharge: 2012-11-27 | Disposition: A | Discharge status: Home / Self Care | Payer: Medicaid Other | Source: Ambulatory Visit | Attending: Oncology | Admitting: Oncology

## 2012-11-27 ENCOUNTER — Ambulatory Visit (HOSPITAL_BASED_OUTPATIENT_CLINIC_OR_DEPARTMENT_OTHER): Payer: Medicaid Other

## 2012-11-27 ENCOUNTER — Ambulatory Visit (HOSPITAL_COMMUNITY)
Admission: RE | Admit: 2012-11-27 | Discharge: 2012-11-27 | Disposition: A | Discharge status: Home / Self Care | Payer: Medicaid Other | Source: Ambulatory Visit | Attending: Oncology | Admitting: Oncology

## 2012-11-27 VITALS — BP 149/83 | HR 93 | Temp 98.5°F | Resp 20

## 2012-11-27 DIAGNOSIS — C771 Secondary and unspecified malignant neoplasm of intrathoracic lymph nodes: Secondary | ICD-10-CM | POA: Insufficient documentation

## 2012-11-27 DIAGNOSIS — R918 Other nonspecific abnormal finding of lung field: Secondary | ICD-10-CM | POA: Insufficient documentation

## 2012-11-27 DIAGNOSIS — Z452 Encounter for adjustment and management of vascular access device: Secondary | ICD-10-CM

## 2012-11-27 DIAGNOSIS — R229 Localized swelling, mass and lump, unspecified: Secondary | ICD-10-CM | POA: Insufficient documentation

## 2012-11-27 DIAGNOSIS — C50919 Malignant neoplasm of unspecified site of unspecified female breast: Secondary | ICD-10-CM | POA: Insufficient documentation

## 2012-11-27 DIAGNOSIS — C50219 Malignant neoplasm of upper-inner quadrant of unspecified female breast: Secondary | ICD-10-CM

## 2012-11-27 DIAGNOSIS — M899 Disorder of bone, unspecified: Secondary | ICD-10-CM | POA: Insufficient documentation

## 2012-11-27 DIAGNOSIS — C770 Secondary and unspecified malignant neoplasm of lymph nodes of head, face and neck: Secondary | ICD-10-CM | POA: Insufficient documentation

## 2012-11-27 DIAGNOSIS — R42 Dizziness and giddiness: Secondary | ICD-10-CM | POA: Insufficient documentation

## 2012-11-27 DIAGNOSIS — M949 Disorder of cartilage, unspecified: Secondary | ICD-10-CM | POA: Insufficient documentation

## 2012-11-27 DIAGNOSIS — I319 Disease of pericardium, unspecified: Secondary | ICD-10-CM | POA: Insufficient documentation

## 2012-11-27 MED ORDER — SODIUM CHLORIDE 0.9 % IJ SOLN
10.0000 mL | INTRAMUSCULAR | Status: DC | PRN
  Administered 2012-11-27: 10 mL via INTRAVENOUS
  Filled 2012-11-27: qty 10

## 2012-11-27 MED ORDER — HEPARIN SOD (PORK) LOCK FLUSH 100 UNIT/ML IV SOLN
500.0000 [IU] | Freq: Once | INTRAVENOUS | Status: AC
  Administered 2012-11-27: 500 [IU] via INTRAVENOUS
  Filled 2012-11-27: qty 5

## 2012-11-27 MED ORDER — FLUDEOXYGLUCOSE F - 18 (FDG) INJECTION
18.5000 | Freq: Once | INTRAVENOUS | Status: AC | PRN
  Administered 2012-11-27: 18.5 via INTRAVENOUS

## 2012-11-27 MED ORDER — GADOBENATE DIMEGLUMINE 529 MG/ML IV SOLN
11.0000 mL | Freq: Once | INTRAVENOUS | Status: AC | PRN
  Administered 2012-11-27: 11 mL via INTRAVENOUS

## 2012-11-27 NOTE — Progress Notes (Signed)
Power port accessed for ct scan.  dmr

## 2012-11-28 ENCOUNTER — Other Ambulatory Visit: Payer: Self-pay | Admitting: *Deleted

## 2012-11-28 ENCOUNTER — Encounter (INDEPENDENT_AMBULATORY_CARE_PROVIDER_SITE_OTHER): Payer: Medicaid Other | Admitting: Surgery

## 2012-11-28 ENCOUNTER — Other Ambulatory Visit: Payer: Self-pay | Admitting: Oncology

## 2012-11-28 DIAGNOSIS — C50919 Malignant neoplasm of unspecified site of unspecified female breast: Secondary | ICD-10-CM

## 2012-11-29 ENCOUNTER — Other Ambulatory Visit: Payer: Medicaid Other | Admitting: Lab

## 2012-11-29 ENCOUNTER — Telehealth: Payer: Self-pay | Admitting: Oncology

## 2012-11-29 ENCOUNTER — Other Ambulatory Visit: Payer: Self-pay | Admitting: Lab

## 2012-11-29 ENCOUNTER — Ambulatory Visit: Payer: Self-pay | Admitting: Oncology

## 2012-11-29 ENCOUNTER — Other Ambulatory Visit (HOSPITAL_BASED_OUTPATIENT_CLINIC_OR_DEPARTMENT_OTHER): Payer: Medicaid Other | Admitting: Lab

## 2012-11-29 ENCOUNTER — Ambulatory Visit: Payer: Medicaid Other | Admitting: Physician Assistant

## 2012-11-29 ENCOUNTER — Ambulatory Visit (HOSPITAL_BASED_OUTPATIENT_CLINIC_OR_DEPARTMENT_OTHER): Payer: Medicaid Other | Admitting: Oncology

## 2012-11-29 ENCOUNTER — Ambulatory Visit: Payer: Self-pay

## 2012-11-29 ENCOUNTER — Ambulatory Visit (HOSPITAL_BASED_OUTPATIENT_CLINIC_OR_DEPARTMENT_OTHER): Payer: Medicaid Other

## 2012-11-29 VITALS — BP 137/83 | HR 91 | Temp 98.3°F | Resp 20 | Ht 60.0 in | Wt 124.1 lb

## 2012-11-29 DIAGNOSIS — C50919 Malignant neoplasm of unspecified site of unspecified female breast: Secondary | ICD-10-CM

## 2012-11-29 DIAGNOSIS — C50419 Malignant neoplasm of upper-outer quadrant of unspecified female breast: Secondary | ICD-10-CM

## 2012-11-29 DIAGNOSIS — J984 Other disorders of lung: Secondary | ICD-10-CM

## 2012-11-29 DIAGNOSIS — C773 Secondary and unspecified malignant neoplasm of axilla and upper limb lymph nodes: Secondary | ICD-10-CM

## 2012-11-29 DIAGNOSIS — Z171 Estrogen receptor negative status [ER-]: Secondary | ICD-10-CM

## 2012-11-29 DIAGNOSIS — Z5111 Encounter for antineoplastic chemotherapy: Secondary | ICD-10-CM

## 2012-11-29 LAB — CBC WITH DIFFERENTIAL/PLATELET
BASO%: 0.3 % (ref 0.0–2.0)
Basophils Absolute: 0 10*3/uL (ref 0.0–0.1)
EOS%: 1.2 % (ref 0.0–7.0)
HCT: 35.5 % (ref 34.8–46.6)
HGB: 11.6 g/dL (ref 11.6–15.9)
LYMPH%: 32.1 % (ref 14.0–49.7)
MCH: 27.1 pg (ref 25.1–34.0)
MCHC: 32.7 g/dL (ref 31.5–36.0)
MCV: 82.9 fL (ref 79.5–101.0)
NEUT%: 51 % (ref 38.4–76.8)
Platelets: 271 10*3/uL (ref 145–400)

## 2012-11-29 LAB — COMPREHENSIVE METABOLIC PANEL (CC13)
AST: 12 U/L (ref 5–34)
Albumin: 3.8 g/dL (ref 3.5–5.0)
Alkaline Phosphatase: 77 U/L (ref 40–150)
BUN: 11.9 mg/dL (ref 7.0–26.0)
Calcium: 9.6 mg/dL (ref 8.4–10.4)
Creatinine: 0.8 mg/dL (ref 0.6–1.1)
Glucose: 103 mg/dl — ABNORMAL HIGH (ref 70–99)

## 2012-11-29 MED ORDER — HEPARIN SOD (PORK) LOCK FLUSH 100 UNIT/ML IV SOLN
500.0000 [IU] | Freq: Once | INTRAVENOUS | Status: AC | PRN
  Administered 2012-11-29: 500 [IU]
  Filled 2012-11-29: qty 5

## 2012-11-29 MED ORDER — ONDANSETRON 8 MG/50ML IVPB (CHCC)
8.0000 mg | Freq: Once | INTRAVENOUS | Status: AC
  Administered 2012-11-29: 8 mg via INTRAVENOUS

## 2012-11-29 MED ORDER — SODIUM CHLORIDE 0.9 % IJ SOLN
10.0000 mL | INTRAMUSCULAR | Status: DC | PRN
  Administered 2012-11-29: 10 mL
  Filled 2012-11-29: qty 10

## 2012-11-29 MED ORDER — DEXAMETHASONE SODIUM PHOSPHATE 10 MG/ML IJ SOLN
10.0000 mg | Freq: Once | INTRAMUSCULAR | Status: AC
  Administered 2012-11-29: 10 mg via INTRAVENOUS

## 2012-11-29 MED ORDER — SODIUM CHLORIDE 0.9 % IV SOLN
Freq: Once | INTRAVENOUS | Status: AC
  Administered 2012-11-29: 13:00:00 via INTRAVENOUS

## 2012-11-29 MED ORDER — PACLITAXEL PROTEIN-BOUND CHEMO INJECTION 100 MG
100.0000 mg/m2 | Freq: Once | INTRAVENOUS | Status: AC
  Administered 2012-11-29: 150 mg via INTRAVENOUS
  Filled 2012-11-29: qty 30

## 2012-11-29 NOTE — Patient Instructions (Addendum)
[facility] Discharge Instructions for Patients Receiving Chemotherapy  Today you received the following chemotherapy agents :  Abraxane.  To help prevent nausea and vomiting after your treatment, we encourage you to take your nausea medication as instructed by your physician.    If you develop nausea and vomiting that is not controlled by your nausea medication, call the clinic. If it is after clinic hours your family physician or the after hours number for the clinic or go to the Emergency Department.   BELOW ARE SYMPTOMS THAT SHOULD BE REPORTED IMMEDIATELY:  *FEVER GREATER THAN 100.5 F  *CHILLS WITH OR WITHOUT FEVER  NAUSEA AND VOMITING THAT IS NOT CONTROLLED WITH YOUR NAUSEA MEDICATION  *UNUSUAL SHORTNESS OF BREATH  *UNUSUAL BRUISING OR BLEEDING  TENDERNESS IN MOUTH AND THROAT WITH OR WITHOUT PRESENCE OF ULCERS  *URINARY PROBLEMS  *BOWEL PROBLEMS  UNUSUAL RASH Items with * indicate a potential emergency and should be followed up as soon as possible.  One of the nurses will contact you 24 hours after your treatment. Please let the nurse know about any problems that you may have experienced. Feel free to call the clinic you have any questions or concerns. The clinic phone number is [phone].   I have been informed and understand all the instructions given to me. I know to contact the clinic, my physician, or go to the Emergency Department if any problems should occur. I do not have any questions at this time, but understand that I may call the clinic during office hours   should I have any questions or need assistance in obtaining follow up care.    __________________________________________  _____________  __________ Signature of Patient or Authorized Representative            Date                   Time    __________________________________________ Nurse's Signature    

## 2012-11-29 NOTE — Telephone Encounter (Signed)
gv pt appt schedule for February thru [facility]. Pt needs PM appt - no availability 2/13, 2/27, and 3/13. Pt aware message would be sent to provider re PM appt. Message to desk nurse.

## 2012-11-29 NOTE — Progress Notes (Signed)
ID: Debbie Larsen   DOB: 11-24-1950  MR#: [id number]  ION#:[id number]  PCP: [name], MD GYN:  SUCicero Duck  OTHER MD: Nicholes Mango, Chipper Herb   HISTORY OF PRESENT ILLNESS: The patient's 2002-2004 records have been retrieved. The patient underwent a right breast core biopsy 03/29/2001 for an invasive ductal carcinoma associated with necrosis ([id number]). 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 The tumor was estrogen receptor 95% positive, progesterone receptor 96% positive, with an MIB-1 of 11%, and no HER-2 amplification by the HercepTest. She completed 6 cycles of CMF chemotherapy on 12/12/2001. There were minor delays, but no dose reductions. She received radiation to the right breast and regional lymph nodes to a total of 5040 cGy with a right breast boost of 12 [name], completed 02/12/2002  More recently, "Debbie Larsen" develop some right shoulder pain sometime in late 2012. She had had followup mammograms until 2011. There was no mammogram performed in 2012. She was seen by physical therapy in orthopedic surgery for evaluation of her right shoulder pain. She had a mammogram on 07/24/2012 which showed a suspicious mass inthe right breast. Physical exam at the time did show multiple involved right axillary lymph nodes. 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 HER-2 nonamplified the ratio by CISH being 1.43. Proliferative index was  50%.   MRI scan of both breasts performed on 08/31/2012 showed multiple involved lymph nodes in the right axilla, no obvious masses in either breast. Unfortunately staging PET scan 09/12/2012 showed in addition to her right axillary recurrence, bilateral mediastinal and left lung nodules consistent with stage IV disease. Her  subsequent history is as detailed below.  INTERVAL HISTORY: Debbie Larsen returns today for followup of her metastatic breast cancer. She is receiving Abraxane every 2 weeks, with good tolerance. Since her last visit here she had her restaging scans.  REVIEW OF SYSTEMS: Debbie Larsen has good energy, no nausea or vomiting, and really no significant problems with peripheral neuropathy. She tells me her legs are "heavy" and she has some numbness and tingling in her right hand which is going to be related to her prior axillary surgery. Sometimes her eyes "flicker". Unfortunately her hair did fall out. Otherwise a detailed review of systems today was noncontributory  PAST MEDICAL HISTORY: Past Medical History  Diagnosis Date  . 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 Hypothyroid   . Enlarged lymph nodes   . BREAST CANCER 04/2001    s/p radiation therapy, chemotherapy and lumpectomy    PAST SURGICAL HISTORY: Past Surgical History  Procedure Date  . Tonsillectomy   . Breast lumpectomy 04/2001    excision of L breast fibroadenoma by Dr.   . Breast lumpectomy 05/16/2001    Right axillary sentinel lymph node biopsy.  . Portacath placement 09/19/2012    Procedure: INSERTION PORT-A-CATH;  Surgeon: [name], MD;  Location: [facility] OR;  Service: General;  Laterality: N/A;    FAMILY HISTORY Family History  Problem Relation Age of Onset  . Diabetes Mother   . Hypertension Father   . Hypertension Sister   . Hypothyroidism Brother   . Cancer Neg Hx    the patient's father died from heart disease at the age of 24. The patient's mother died 2012-07-31 at the age of 3 also from heart  disease. The patient has 2 sisters and 2 brothers. There is no history of breast or ovarian cancer in the family to her knowledge.  GYNECOLOGIC HISTORY: GX P1, first live birth age 68. She underwent menopause at the time of her chemotherapy in 2002. She did not use hormone replacement.  SOCIAL HISTORY: The patient is  divorced and lives by herself. Her son Debbie Larsen lives in [place], where he works as an Publishing copy. The patient tells me she does not like to burden him because "he is very emotional". The patient has no grandchildren. She attends the local [employer].   ADVANCED DIRECTIVES: The patient tells me (11/01/2012) her healthcare power of attorney is her sister Debbie Larsen. She was unable to give me her contact information   HEALTH MAINTENANCE: History  Substance Use Topics  . Smoking status: Never Smoker   . Smokeless tobacco: Never Used  . Alcohol Use: No     Colonoscopy: never  PAP:  Bone density: never  Lipid panel:  No Known Allergies  Current Outpatient Prescriptions  Medication Sig Dispense Refill  . acetaminophen (TYLENOL) 500 MG tablet Take 500 mg by mouth every 4 (four) hours as needed. For pain.      Marland Kitchen aspirin 325 MG tablet Take 1 tablet (325 mg total) by mouth daily.  30 tablet  0  . cholecalciferol 2000 UNITS tablet Take 1 tablet (2,000 Units total) by mouth daily.      Marland Kitchen levothyroxine (SYNTHROID, LEVOTHROID) 75 MCG tablet Take 1 tablet (75 mcg total) by mouth daily. (dosage change)  90 tablet  1  . lidocaine-prilocaine (EMLA) cream Apply topically as needed. Apply to port 1-2 hours before procedure  30 g  0  . loratadine (CLARITIN) 10 MG tablet Take 1 tablet (10 mg total) by mouth daily.  30 tablet  11  . LORazepam (ATIVAN) 0.5 MG tablet Take 1 tablet (0.5 mg total) by mouth 2 (two) times daily as needed for anxiety.  30 tablet  1  . prochlorperazine (COMPAZINE) 10 MG tablet Take 1 tablet (10 mg total) by mouth every 6 (six) hours as needed.  30 tablet  2  . traMADol (ULTRAM) 50 MG tablet Take 1 tablet (50 mg total) by mouth every 8 (eight) hours as needed for pain.  30 tablet  1    OBJECTIVE: Middle aged [place] Asian woman in no acute distress  Filed Vitals:   11/29/12 1106  BP: 137/83  Pulse: 91  Temp: 98.3 F (36.8 C)  Resp: 20     Body mass index is 24.24 kg/(m^2).     ECOG FS: 2  Sclerae unicteric Oropharynx clear No cervical or supraclavicular adenopathy Lungs no rales or rhonchi, good excursion bilaterally Heart regular rate and rhythm, no murmur appreciated Abdomen benign MSK no focal spinal tenderness, no peripheral edema, decreased range of motion in the right upper extremity Neuro: Decreased grip on the right, strength 3/5 in the right upper extremity, 5 out of 5 in the left. Otherwise nonfocal, well oriented, anxious but pleasant affect Breasts: Deferred. I do not palpate well-defined adenopathy in the right axilla    LAB RESULTS:  Lab Results  Component Value Date   WBC 6.0 11/29/2012   NEUTROABS 3.1 11/29/2012   HGB 11.6 11/29/2012   HCT 35.5 11/29/2012   MCV 82.9 11/29/2012   PLT 271 11/29/2012      Chemistry      Component Value Date/Time   NA 137 11/15/2012 1343   NA  138 09/18/2012 1531   K 4.1 11/15/2012 1343   K 3.7 09/18/2012 1531   CL 105 11/15/2012 1343   CL 103 09/18/2012 1531   CO2 23 11/15/2012 1343   CO2 23 09/18/2012 1531   BUN 7.0 11/15/2012 1343   BUN 9 09/18/2012 1531   CREATININE 0.8 11/15/2012 1343   CREATININE 0.70 09/18/2012 1531   CREATININE 0.74 03/01/2012 1715      Component Value Date/Time   CALCIUM 9.4 11/15/2012 1343   CALCIUM 9.8 09/18/2012 1531   ALKPHOS 79 11/15/2012 1343   ALKPHOS 81 09/18/2012 1531   AST 11 11/15/2012 1343   AST 18 09/18/2012 1531   ALT 9 11/15/2012 1343   ALT 13 09/18/2012 1531   BILITOT 0.46 11/15/2012 1343   BILITOT 0.3 09/18/2012 1531       Lab Results  Component Value Date   LABCA2 34 10/04/2012     STUDIES: Mr Laqueta Jean ZO Contrast  12-22-2012  *RADIOLOGY REPORT*  Clinical Data: Breast cancer.  Dizziness. Question of intracranial metastatic disease.  MRI HEAD WITHOUT AND WITH CONTRAST  Technique:  Multiplanar, multiecho pulse sequences of the brain and surrounding structures were obtained according to standard protocol without and with intravenous contrast  Contrast: 11mL  MULTIHANCE GADOBENATE DIMEGLUMINE 529 MG/ML IV SOLN  Comparison: 05/11/2012  Findings: No intracranial enhancing lesion to suggest presence of intracranial metastatic disease.  The heterogeneous appearance of bone marrow involving the clivus and upper cervical spine was noted prior exam and is without change.  This may be normal for this patient rather representing infiltration by tumor.  Mild left-sided C5-6 cervical spondylotic changes and spinal stenosis.  Asymmetric partially empty sella stable.  No acute infarct.  No intracranial hemorrhage.  Major intracranial vascular structures are patent.  Minimal paranasal sinus mucosal thickening.  IMPRESSION: No evidence of intracranial metastatic disease.  Please see above.   Original Report Authenticated By: Lacy Duverney, M.D.    Nm Pet Image Restag (ps) Skull Base To Thigh  12-22-2012  *RADIOLOGY REPORT*  Clinical Data: Subsequent treatment strategy for stage IV breast cancer. Abraxane therapy.  NUCLEAR MEDICINE PET SKULL BASE TO THIGH  Fasting Blood Glucose:  110  Technique:  18.5 mCi F-18 FDG was injected intravenously. CT data was obtained and used for attenuation correction and anatomic localization only.  (This was not acquired as a diagnostic CT examination.) Additional exam technical data entered on technologist worksheet.  Comparison:  PET CT 09/12/2012.  Findings:  Neck: No hypermetabolic lymph nodes in the neck.  Chest:  The irregular right axillary mass appears slightly smaller, measuring 1.7 x 1.4 cm on image 55.  It demonstrates less hypermetabolic activity (SUV max 5.8; previously 9.7).  Right paratracheal, subcarinal and bilateral hilar nodal metastases have also improved, all smaller and with less hypermetabolic activity. For example, the right paratracheal node has an SUV max of 7.0 compared with 11.4 previously.  The previously demonstrated left lung nodules are all smaller and without residual hypermetabolic activity.  No new lesions are  identified.  A small pericardial effusion is unchanged.  Abdomen/Pelvis:  No abnormal hypermetabolic activity within the liver, pancreas, adrenal glands, or spleen.  No hypermetabolic lymph nodes in the abdomen or pelvis.  Skeleton:  It is difficult to exclude a small hypermetabolic focus within the anterior cortex of the proximal left femoral diaphysis. This has an SUV max of 3.6.  There is no clear corresponding lytic or blastic lesion on the CT images.  There is no  other evidence of osseous metastatic disease.  IMPRESSION:  1.  Interval decrease size and metabolic activity of the right axillary mass, multiple mediastinal and hilar lymph nodes and left lung nodules consistent with a pulse response to interval therapy. 2.  No evidence of metastatic disease within the abdomen or pelvis. 3.  No definite osseous metastases. However, a tiny cortical lesion involving the proximal left femoral diaphysis anteriorly is difficult to exclude.   Original Report Authenticated By: Carey Bullocks, M.D.      ASSESSMENT: 62 y.o. [place] woman originally from [place] with stage IV breast cancer  (1) status post right lumpectomy and sentinel lymph node sampling 05/16/2001 for a pT1c pN1, stage IIA invasive ductal carcinoma, grade 2, estrogen receptor 95% and progesterone receptor 96% positive, with an MIB-1 of 11% and no HER-2 amplification by immunocytochemistry.  (2) treated adjuvantly with cyclophosphamide, methotrexate and fluorouracil, likely for 8 cycles (data unavailable), completed February 2003  (3) completed radiation to the right breast and regional lymph nodes 02/13/1999  (4) the patient apparently refused completion axillary lymph node dissection and antiestrogen therapy (data unavailable).  (5) recurrent disease documented by right axillary lymph node biopsy 08/23/2012, showing a high-grade invasive ductal carcinoma which was estrogen and progesterone receptor negative, with an MIB-1 of 59%, and no HER-2  amplification; staging studies show hyper metabolic mediastinal and left lung lesions, with no involvement of the bones, liver or brain  (6) Abraxane every 2 weeks started 09/20/2012, with good tolerance and initial evidence of response.   PLAN:  Stool will have her sixth dose of Abraxane today, given every 2 weeks, of 12 doses planned. The brain MRI was negative, and a repeat PET scan shows an excellent initial response. We are going to continue to see her every 2 weeks, and repeat her PET scan after the 12 doses. Of course we will be watching for neuropathy or other possible side effects of concern.  She really has an appointment at [facility] at to consider whatever studies she might qualify for their after she completes her treatments here. She does not qualify for any of the studies we have for metastatic breast cancer here in [place]. She is also scheduled to meet with our genetic counselor March 17.  She knows to call for any problems that may develop before the next visit.   MAGRINAT,GUSTAV C    11/29/2012

## 2012-12-03 ENCOUNTER — Other Ambulatory Visit: Payer: Medicaid Other | Admitting: Lab

## 2012-12-03 ENCOUNTER — Ambulatory Visit (HOSPITAL_BASED_OUTPATIENT_CLINIC_OR_DEPARTMENT_OTHER): Payer: Medicaid Other | Admitting: Genetic Counselor

## 2012-12-03 ENCOUNTER — Encounter: Payer: Medicaid Other | Admitting: Genetic Counselor

## 2012-12-03 DIAGNOSIS — C773 Secondary and unspecified malignant neoplasm of axilla and upper limb lymph nodes: Secondary | ICD-10-CM

## 2012-12-03 DIAGNOSIS — C50219 Malignant neoplasm of upper-inner quadrant of unspecified female breast: Secondary | ICD-10-CM

## 2012-12-03 DIAGNOSIS — Z853 Personal history of malignant neoplasm of breast: Secondary | ICD-10-CM

## 2012-12-03 DIAGNOSIS — IMO0002 Reserved for concepts with insufficient information to code with codable children: Secondary | ICD-10-CM

## 2012-12-03 DIAGNOSIS — C50919 Malignant neoplasm of unspecified site of unspecified female breast: Secondary | ICD-10-CM

## 2012-12-04 ENCOUNTER — Encounter: Payer: Self-pay | Admitting: Genetic Counselor

## 2012-12-04 ENCOUNTER — Other Ambulatory Visit: Payer: Medicaid Other | Admitting: Lab

## 2012-12-04 NOTE — Progress Notes (Signed)
Dr.  Raymond Gurney Magrinat requested a consultation for genetic counseling and risk assessment for Debbie Larsen, a 62 y.o. female, for discussion of her personal history of breast cancer. She presents to clinic today to discuss the possibility of a genetic predisposition to cancer, and to further clarify her risks, as well as her family members' risks for cancer.   HISTORY OF PRESENT ILLNESS: In 2002, at the age of 51, Debbie Larsen was diagnosed with breast cancer. This was treated with chemotherapy, surgery and radiation.  In 2013, at the age of 72, Debbie Larsen was diagnosed with triple negative breast cancer.    Past Medical History  Diagnosis Date  . 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 Hypothyroid   . Enlarged lymph nodes   . BREAST CANCER 04/2001    s/p radiation therapy, chemotherapy and lumpectomy    Past Surgical History  Procedure Date  . Tonsillectomy   . Breast lumpectomy 04/2001    excision of L breast fibroadenoma by Dr.   . Breast lumpectomy 05/16/2001    Right axillary sentinel lymph node biopsy.  . Portacath placement 09/19/2012    Procedure: INSERTION PORT-A-CATH;  Surgeon: [name], MD;  Location: [facility] OR;  Service: General;  Laterality: N/A;    History  Substance Use Topics  . Smoking status: Never Smoker   . Smokeless tobacco: Never Used  . Alcohol Use: No    REPRODUCTIVE HISTORY AND PERSONAL RISK ASSESSMENT FACTORS: Menarche was at age 19 1/2.   Post Menopausal Uterus Intact: Yes Ovaries Intact: Yes G1P1A0 , first live birth at age 59  She has not previously undergone treatment for infertility.   OCP use for 4 years   She has not used HRT in the past.    FAMILY HISTORY:  We obtained a detailed, 4-generation family history.  Significant diagnoses are listed below: Family History  Problem Relation Age of Onset  . Diabetes Mother   . Hypertension Father   . Hypertension Sister   . Hypothyroidism Brother   . Cancer Neg Hx   The  patient was diagnosed with breast cancer at ages 65 and 64.  Her father died at 38 and at the time showed signs of lung scarring.  He had a maternal uncle and cousin who were diagnosed with pulmonary fibrosis.  There is no other family history of cancer.  Patient's maternal ancestors are of [place] descent, and paternal ancestors are of [place] descent. There is no reported Ashkenazi Jewish ancestry. There is no  known consanguinity.  GENETIC COUNSELING RISK ASSESSMENT, DISCUSSION, AND SUGGESTED FOLLOW UP: We reviewed the natural history and genetic etiology of sporadic, familial and hereditary cancer syndromes.  About 5-10% of breast cancer is hereditary.  Of this, about 85% is the result of a BRCA1 or BRCA2 mutation.  We reviewed the red flags of hereditary cancer syndromes and the dominant inheritance patterns.  If the BRCA testing is negative, we discussed that we could be testing for the wrong gene.  We discussed gene panels, and that several cancer genes that are associated with different cancers can be tested at the same time.  Because of the different types of cancer that are in the patient's family, we will consider one of the panel tests if she is negative for BRCA mutations.  We discussed that the chance of her having a hereditary cancer gene, with nobody else in her large family having cancer is low.  Unfortunately, she has [place]  Medicaid, which will not pay for genetic testing.  We discussed sending her to [facility] for testing through their research protocol, waiting until she starts on Medicare, or waiting to see if we receive funding through the [employer].    The patient's personal history is suggestive of the following possible diagnosis: sporadic vs. Hereditary cancer syndrome  We discussed that identification of a hereditary cancer syndrome may help her care providers tailor the patients medical management. If a mutation indicating a hereditary cancer syndrome is  detected in this case, the [employer] recommendations would include increased cancer surveillance and possible prophylactic sugery. If a mutation is detected, the patient will be referred back to the referring provider and to any additional appropriate care providers to discuss the relevant options.   If a mutation is not found in the patient, this will decrease the likelihood of a hereditary cancer syndrome as the explanation for her breast cancer. Cancer surveillance options would be discussed for the patient according to the appropriate standard National Comprehensive Cancer Network and American Cancer Society guidelines, with consideration of their personal and family history risk factors. In this case, the patient will be referred back to their care providers for discussions of management.   In order to estimate her chance of having a BRCA mutation, we used statistical models (Penn II) and laboratory data that take into account her personal medical history, family history and ancestry.  Because each model is different, there can be a lot of variability in the risks they give.  Therefore, these numbers must be considered a rough range and not a precise risk of having a BRCA mutation.  These models estimate that she has approximately a 7% chance of having a mutation. Based on this assessment of her family and personal history, genetic testing is recommended.  After considering the risks, benefits, and limitations, the patient declined testing at this time.   Per the patient's request, we will contact her by telephone to discuss these results. A follow up genetic counseling visit will be scheduled if indicated.  The patient was seen for a total of 60 minutes, greater than 50% of which was spent face-to-face counseling.  This plan is being carried out per Dr. Ruthann Cancer recommendations.  This note will also be sent to the referring provider via the electronic medical  record. The patient will be supplied with a summary of this genetic counseling discussion as well as educational information on the discussed hereditary cancer syndromes following the conclusion of their visit.   Patient was discussed with Dr. Drue Second.   _______________________________________________________________________ For Office Staff:  Number of people involved in session: 3 Was an Intern/ student involved with case: yes

## 2012-12-05 ENCOUNTER — Telehealth: Payer: Self-pay | Admitting: Oncology

## 2012-12-05 NOTE — Telephone Encounter (Signed)
All appts moved to PM except 2/13. No availability w/GM or APP's for PM appt 2/13. S/w pt she is aware and will get new schedule 2/13. GM has no availability 3/13 and was emailed re time. Pt has f/u appts w/APP again 2/13 and 2/27. GM f/u can be resolved at either visit.

## 2012-12-06 ENCOUNTER — Ambulatory Visit: Payer: Medicaid Other | Admitting: Family Medicine

## 2012-12-06 ENCOUNTER — Telehealth: Payer: Self-pay | Admitting: *Deleted

## 2012-12-06 ENCOUNTER — Encounter: Payer: Self-pay | Admitting: Family Medicine

## 2012-12-06 ENCOUNTER — Ambulatory Visit: Payer: Medicaid Other | Admitting: Oncology

## 2012-12-06 VITALS — BP 125/80 | HR 72

## 2012-12-06 DIAGNOSIS — C50919 Malignant neoplasm of unspecified site of unspecified female breast: Secondary | ICD-10-CM

## 2012-12-06 DIAGNOSIS — F411 Generalized anxiety disorder: Secondary | ICD-10-CM

## 2012-12-06 DIAGNOSIS — M25519 Pain in unspecified shoulder: Secondary | ICD-10-CM

## 2012-12-06 NOTE — Progress Notes (Signed)
Subjective:     Patient ID: Debbie Larsen, female   DOB: January 18, 1951, 62 y.o.   MRN: [id number]  HPI 62 yo F patient seen for home visit to evaluate level of function and to assess how she is coping with recurrent breast cancer.   1. Function: Patient lives alone.  She cooks, cleans, exercises.  She does not drive due to R hand weakness and pain.  She has a transportation service to take her to and from appointments    2. Recurrent breat cancer: R breast stage IV.  She is currently undergoing chemo with Abraxane. She has bilateral leg heaviness/numbness post infusion. She also reports lightheadedness upon standing bout 2-3 days post infusion. She is concerned about hypertension. She denies documented elevated BP.  She controls nausea with ginger tea. She admits to regret that her recurrence was not found sooner. She is has been referred to [facility] cancer center to discuss gene directed therapy.   3. R hand weakness: thumb and thenar eminence. Associated with nighttime pain.   Review of Systems As per HPI     Objective:   Physical Exam BP 125/80  Pulse 72 General appearance: alert, cooperative and no distress R hand: thenar wasting. Weakened thumb opposition.      Assessment and Plan:     62 yo F with seen for home visit.  A:  No needs or functional limitations.  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 There is no evidence of hypertension.

## 2012-12-06 NOTE — Telephone Encounter (Signed)
12/06/12 at 4:26pm - Dr. Darnelle Catalan referred the pt to any "triple negative breast cancer trial" on 11/05/12.  The pt's chart was reviewed by Genella Rife, research nurse.  The pt was not eligible for the [employer] trial.  The 2nd trial that the pt may have been eligible for closed to accrual.  Zollie Scale, Dr. Darrall Dears PA, notified the pt that the [facility] had no trials for her type of cancer on 11/15/12.  The pt was referred to Marilu Favre, Teacher, English as a foreign language.  Marilu Favre, Teacher, English as a foreign language, asked the research nurse to call the pt directly and tell her that our research department has no current trials that she is eligible for. The pt was thanked for her interest in clinical trials.  The pt was encouraged to go to [facility] for their clinical trials.

## 2012-12-10 ENCOUNTER — Telehealth: Payer: Self-pay | Admitting: Oncology

## 2012-12-10 NOTE — Telephone Encounter (Signed)
F/u added to 3/13 lb/tx. Per GM pt to see AB. Pt already aware to get new schedule when she comes in 2/13.

## 2012-12-12 ENCOUNTER — Other Ambulatory Visit: Payer: Self-pay | Admitting: Physician Assistant

## 2012-12-12 DIAGNOSIS — C50919 Malignant neoplasm of unspecified site of unspecified female breast: Secondary | ICD-10-CM

## 2012-12-13 ENCOUNTER — Other Ambulatory Visit: Payer: Self-pay | Admitting: *Deleted

## 2012-12-13 ENCOUNTER — Ambulatory Visit: Payer: Medicaid Other | Admitting: Physician Assistant

## 2012-12-13 ENCOUNTER — Ambulatory Visit: Payer: Self-pay

## 2012-12-13 ENCOUNTER — Telehealth: Payer: Self-pay | Admitting: *Deleted

## 2012-12-13 ENCOUNTER — Other Ambulatory Visit: Payer: Medicaid Other | Admitting: Lab

## 2012-12-13 ENCOUNTER — Ambulatory Visit: Payer: Self-pay | Admitting: Oncology

## 2012-12-13 ENCOUNTER — Other Ambulatory Visit: Payer: Self-pay | Admitting: Lab

## 2012-12-13 ENCOUNTER — Telehealth: Payer: Self-pay | Admitting: Oncology

## 2012-12-13 NOTE — Telephone Encounter (Signed)
Sent michelle a staff message to cancel the pt's appts for today and r/s lab and infusion appt for 12/14/2012 per pt's request

## 2012-12-13 NOTE — Telephone Encounter (Signed)
Called and spoke with pt; not able to come due to weather.  Pt stated she will re-schedule.

## 2012-12-14 ENCOUNTER — Telehealth: Payer: Self-pay | Admitting: Oncology

## 2012-12-14 ENCOUNTER — Other Ambulatory Visit (HOSPITAL_BASED_OUTPATIENT_CLINIC_OR_DEPARTMENT_OTHER): Payer: Medicaid Other | Admitting: Lab

## 2012-12-14 ENCOUNTER — Ambulatory Visit (HOSPITAL_BASED_OUTPATIENT_CLINIC_OR_DEPARTMENT_OTHER): Payer: Medicaid Other

## 2012-12-14 VITALS — BP 157/91 | HR 83 | Resp 18

## 2012-12-14 DIAGNOSIS — C50919 Malignant neoplasm of unspecified site of unspecified female breast: Secondary | ICD-10-CM

## 2012-12-14 DIAGNOSIS — C50219 Malignant neoplasm of upper-inner quadrant of unspecified female breast: Secondary | ICD-10-CM

## 2012-12-14 DIAGNOSIS — C773 Secondary and unspecified malignant neoplasm of axilla and upper limb lymph nodes: Secondary | ICD-10-CM

## 2012-12-14 DIAGNOSIS — Z5111 Encounter for antineoplastic chemotherapy: Secondary | ICD-10-CM

## 2012-12-14 DIAGNOSIS — Z170 Estrogen receptor positive status [ER+]: Secondary | ICD-10-CM

## 2012-12-14 LAB — CBC WITH DIFFERENTIAL/PLATELET
Basophils Absolute: 0.1 10*3/uL (ref 0.0–0.1)
EOS%: 0.6 % (ref 0.0–7.0)
Eosinophils Absolute: 0 10*3/uL (ref 0.0–0.5)
HCT: 33.5 % — ABNORMAL LOW (ref 34.8–46.6)
HGB: 10.9 g/dL — ABNORMAL LOW (ref 11.6–15.9)
MCH: 26.7 pg (ref 25.1–34.0)
MCV: 81.8 fL (ref 79.5–101.0)
NEUT#: 2.6 10*3/uL (ref 1.5–6.5)
NEUT%: 48.2 % (ref 38.4–76.8)
RDW: 16.1 % — ABNORMAL HIGH (ref 11.2–14.5)
lymph#: 2 10*3/uL (ref 0.9–3.3)

## 2012-12-14 LAB — COMPREHENSIVE METABOLIC PANEL (CC13)
Albumin: 3.8 g/dL (ref 3.5–5.0)
BUN: 9.7 mg/dL (ref 7.0–26.0)
Calcium: 9.6 mg/dL (ref 8.4–10.4)
Chloride: 106 mEq/L (ref 98–107)
Creatinine: 0.8 mg/dL (ref 0.6–1.1)
Glucose: 120 mg/dl — ABNORMAL HIGH (ref 70–99)
Potassium: 3.9 mEq/L (ref 3.5–5.1)

## 2012-12-14 MED ORDER — PACLITAXEL PROTEIN-BOUND CHEMO INJECTION 100 MG
100.0000 mg/m2 | Freq: Once | INTRAVENOUS | Status: AC
  Administered 2012-12-14: 150 mg via INTRAVENOUS
  Filled 2012-12-14: qty 30

## 2012-12-14 MED ORDER — HEPARIN SOD (PORK) LOCK FLUSH 100 UNIT/ML IV SOLN
500.0000 [IU] | Freq: Once | INTRAVENOUS | Status: AC | PRN
  Administered 2012-12-14: 500 [IU]
  Filled 2012-12-14: qty 5

## 2012-12-14 MED ORDER — LIDOCAINE-PRILOCAINE 2.5-2.5 % EX CREA
TOPICAL_CREAM | Freq: Once | CUTANEOUS | Status: AC
  Administered 2012-12-14: 17:00:00 via TOPICAL

## 2012-12-14 MED ORDER — SODIUM CHLORIDE 0.9 % IV SOLN
Freq: Once | INTRAVENOUS | Status: AC
  Administered 2012-12-14: 15:00:00 via INTRAVENOUS

## 2012-12-14 MED ORDER — DEXAMETHASONE SODIUM PHOSPHATE 10 MG/ML IJ SOLN
10.0000 mg | Freq: Once | INTRAMUSCULAR | Status: AC
  Administered 2012-12-14: 10 mg via INTRAVENOUS

## 2012-12-14 MED ORDER — SODIUM CHLORIDE 0.9 % IJ SOLN
10.0000 mL | INTRAMUSCULAR | Status: DC | PRN
Start: 2012-12-14 — End: 2012-12-14
  Administered 2012-12-14: 10 mL
  Filled 2012-12-14: qty 10

## 2012-12-14 MED ORDER — ONDANSETRON 8 MG/50ML IVPB (CHCC)
8.0000 mg | Freq: Once | INTRAVENOUS | Status: AC
  Administered 2012-12-14: 8 mg via INTRAVENOUS

## 2012-12-14 NOTE — Telephone Encounter (Signed)
Pt is aware of her appt for today@2 :00pm.

## 2012-12-14 NOTE — Patient Instructions (Signed)
[facility] Discharge Instructions for Patients Receiving Chemotherapy  Today you received the following chemotherapy agents: Abraxane. To help prevent nausea and vomiting after your treatment, we encourage you to take your nausea medication, Prochlorperazine as needed.    If you develop nausea and vomiting that is not controlled by your nausea medication, call the clinic. If it is after clinic hours your family physician or the after hours number for the clinic or go to the Emergency Department.   BELOW ARE SYMPTOMS THAT SHOULD BE REPORTED IMMEDIATELY:  *FEVER GREATER THAN 100.5 F  *CHILLS WITH OR WITHOUT FEVER  NAUSEA AND VOMITING THAT IS NOT CONTROLLED WITH YOUR NAUSEA MEDICATION  *UNUSUAL SHORTNESS OF BREATH  *UNUSUAL BRUISING OR BLEEDING  TENDERNESS IN MOUTH AND THROAT WITH OR WITHOUT PRESENCE OF ULCERS  *URINARY PROBLEMS  *BOWEL PROBLEMS  UNUSUAL RASH Items with * indicate a potential emergency and should be followed up as soon as possible.  Feel free to call the clinic you have any questions or concerns. The clinic phone number is [phone].   I have been informed and understand all the instructions given to me. I know to contact the clinic, my physician, or go to the Emergency Department if any problems should occur. I do not have any questions at this time, but understand that I may call the clinic during office hours   should I have any questions or need assistance in obtaining follow up care.

## 2012-12-20 ENCOUNTER — Ambulatory Visit: Payer: Self-pay | Admitting: Oncology

## 2012-12-20 ENCOUNTER — Other Ambulatory Visit: Payer: Self-pay | Admitting: Lab

## 2012-12-20 ENCOUNTER — Ambulatory Visit: Payer: Self-pay

## 2012-12-21 ENCOUNTER — Encounter (INDEPENDENT_AMBULATORY_CARE_PROVIDER_SITE_OTHER): Payer: Medicaid Other | Admitting: Surgery

## 2012-12-25 ENCOUNTER — Other Ambulatory Visit: Payer: Medicaid Other | Admitting: Lab

## 2012-12-25 ENCOUNTER — Ambulatory Visit: Payer: Medicaid Other | Admitting: Physician Assistant

## 2012-12-25 DIAGNOSIS — F411 Generalized anxiety disorder: Secondary | ICD-10-CM | POA: Insufficient documentation

## 2012-12-25 NOTE — Assessment & Plan Note (Signed)
Improved right hand function. Surgery on axillary nodes pending.

## 2012-12-25 NOTE — Assessment & Plan Note (Signed)
Tolerating her chemotherapy for recurrence

## 2012-12-25 NOTE — Assessment & Plan Note (Addendum)
Controlled by tramadol and acetaminophen

## 2012-12-26 ENCOUNTER — Other Ambulatory Visit: Payer: Self-pay | Admitting: Family

## 2012-12-26 DIAGNOSIS — C50919 Malignant neoplasm of unspecified site of unspecified female breast: Secondary | ICD-10-CM

## 2012-12-27 ENCOUNTER — Ambulatory Visit: Payer: Medicaid Other

## 2012-12-27 ENCOUNTER — Other Ambulatory Visit (HOSPITAL_BASED_OUTPATIENT_CLINIC_OR_DEPARTMENT_OTHER): Payer: Medicaid Other | Admitting: Lab

## 2012-12-27 ENCOUNTER — Ambulatory Visit: Payer: Medicaid Other | Admitting: Physician Assistant

## 2012-12-27 ENCOUNTER — Encounter: Payer: Self-pay | Admitting: Family

## 2012-12-27 ENCOUNTER — Ambulatory Visit (HOSPITAL_BASED_OUTPATIENT_CLINIC_OR_DEPARTMENT_OTHER): Payer: Medicaid Other

## 2012-12-27 ENCOUNTER — Ambulatory Visit (HOSPITAL_BASED_OUTPATIENT_CLINIC_OR_DEPARTMENT_OTHER): Payer: Medicaid Other | Admitting: Family

## 2012-12-27 VITALS — BP 124/63 | HR 83 | Temp 98.2°F | Resp 18

## 2012-12-27 DIAGNOSIS — C50919 Malignant neoplasm of unspecified site of unspecified female breast: Secondary | ICD-10-CM

## 2012-12-27 DIAGNOSIS — C50219 Malignant neoplasm of upper-inner quadrant of unspecified female breast: Secondary | ICD-10-CM

## 2012-12-27 DIAGNOSIS — C773 Secondary and unspecified malignant neoplasm of axilla and upper limb lymph nodes: Secondary | ICD-10-CM

## 2012-12-27 DIAGNOSIS — Z170 Estrogen receptor positive status [ER+]: Secondary | ICD-10-CM

## 2012-12-27 DIAGNOSIS — Z5111 Encounter for antineoplastic chemotherapy: Secondary | ICD-10-CM

## 2012-12-27 LAB — CBC WITH DIFFERENTIAL/PLATELET
Basophils Absolute: 0 10*3/uL (ref 0.0–0.1)
EOS%: 0.4 % (ref 0.0–7.0)
Eosinophils Absolute: 0 10*3/uL (ref 0.0–0.5)
HCT: 33.6 % — ABNORMAL LOW (ref 34.8–46.6)
HGB: 10.9 g/dL — ABNORMAL LOW (ref 11.6–15.9)
MCH: 26.3 pg (ref 25.1–34.0)
MONO#: 0.6 10*3/uL (ref 0.1–0.9)
NEUT#: 2.4 10*3/uL (ref 1.5–6.5)
NEUT%: 53.7 % (ref 38.4–76.8)
RDW: 15.1 % — ABNORMAL HIGH (ref 11.2–14.5)
WBC: 4.5 10*3/uL (ref 3.9–10.3)
lymph#: 1.4 10*3/uL (ref 0.9–3.3)

## 2012-12-27 LAB — COMPREHENSIVE METABOLIC PANEL (CC13)
ALT: 11 U/L (ref 0–55)
AST: 14 U/L (ref 5–34)
Calcium: 9.5 mg/dL (ref 8.4–10.4)
Chloride: 106 mEq/L (ref 98–107)
Creatinine: 0.7 mg/dL (ref 0.6–1.1)
Sodium: 140 mEq/L (ref 136–145)
Total Protein: 7.5 g/dL (ref 6.4–8.3)

## 2012-12-27 LAB — CEA: CEA: 2 ng/mL (ref 0.0–5.0)

## 2012-12-27 LAB — CANCER ANTIGEN 27.29: CA 27.29: 27 U/mL (ref 0–39)

## 2012-12-27 MED ORDER — SODIUM CHLORIDE 0.9 % IJ SOLN
10.0000 mL | INTRAMUSCULAR | Status: DC | PRN
  Filled 2012-12-27: qty 10

## 2012-12-27 MED ORDER — PACLITAXEL PROTEIN-BOUND CHEMO INJECTION 100 MG
100.0000 mg/m2 | Freq: Once | INTRAVENOUS | Status: AC
  Administered 2012-12-27: 150 mg via INTRAVENOUS
  Filled 2012-12-27: qty 30

## 2012-12-27 MED ORDER — ONDANSETRON 8 MG/50ML IVPB (CHCC)
8.0000 mg | Freq: Once | INTRAVENOUS | Status: AC
  Administered 2012-12-27: 8 mg via INTRAVENOUS

## 2012-12-27 MED ORDER — HEPARIN SOD (PORK) LOCK FLUSH 100 UNIT/ML IV SOLN
500.0000 [IU] | Freq: Once | INTRAVENOUS | Status: DC | PRN
  Filled 2012-12-27: qty 5

## 2012-12-27 MED ORDER — DEXAMETHASONE SODIUM PHOSPHATE 10 MG/ML IJ SOLN
10.0000 mg | Freq: Once | INTRAMUSCULAR | Status: AC
  Administered 2012-12-27: 10 mg via INTRAVENOUS

## 2012-12-27 MED ORDER — SODIUM CHLORIDE 0.9 % IV SOLN
Freq: Once | INTRAVENOUS | Status: AC
  Administered 2012-12-27: 15:00:00 via INTRAVENOUS

## 2012-12-27 NOTE — Patient Instructions (Signed)
Please contact us at [phone] if you have any questions or concerns. 

## 2012-12-27 NOTE — Progress Notes (Signed)
ID: Debbie Larsen   DOB: 28-Oct-1951  MR#: [id number]  MVH#:[id number]  PCP: [name], MD SU: [name], MD OTHER MD: Nicholes Mango, MD, [name], MD   HISTORY OF PRESENT ILLNESS: The patient's 2002-2004 records have been retrieved. The sugar the patient underwent a right breast core biopsy 03/29/2001 for an invasive ductal carcinoma associated with necrosis ([id number]). 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 The tumor was estrogen receptor 95% positive, progesterone receptor 96% positive, with an MIB-1 of 11%, and no HER-2 amplification by the HercepTest. She completed 6 cycles of CMF chemotherapy on 12/12/2001. There were minor delays, but no dose reductions. She received radiation to the right breast and regional lymph nodes to a total of 5040 cGy with a right breast boost of 12 [name], completed 02/12/2002  More recently, "Debbie Larsen" develop some right shoulder pain sometime in late 2012. She had followup mammograms of until 2011. There was no mammogram performed in 2012. She was seen by physical therapy in orthopedic surgery for evaluation of her right shoulder pain. She had a mammogram on 07/24/2012 which showed a suspicious mass behind eczema of the right breast.  Physical exam the time did show multiple involved right axillary lymph nodes. 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 HER-2 nonamplified the ratio by C[facility] being 1.43. Proliferative index was  50%.   MRI scan of both breasts performed on 08/31/2012 showed multiple involved lymph nodes in the right axilla, no obvious masses in either breast. Unfortunately staging PET scan 09/12/2012 showed in addition to her right axillary recurrence, bilateral mediastinal and left lung nodules  consistent with stage IV disease. Her subsequent history is as detailed below.  INTERVAL HISTORY: Debbie Larsen returns today for followup of her metastatic breast cancer. She continues to receive Abraxane every 2 weeks and is due for her eighth dose today. She was over 30 minutes late for her appointment today and was seen in the infusion area.  Interval history is generally unremarkable, and with the exception of some mild dizziness that she reports having a few days after chemotherapy that resolves spontaneously. Debbie Larsen continues to tolerate the treatment extremely well. Specifically, she has had no emesis, no mouth ulcers, and he denies any signs of peripheral neuropathy.  REVIEW OF SYSTEMS: A detailed review of systems was completed and is otherwise stable and noncontributory. Debbie Larsen denies any other symptomatology.   PAST MEDICAL HISTORY: Past Medical History  Diagnosis Date  . 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 Hypothyroid   . Enlarged lymph nodes   . BREAST CANCER 04/2001    s/p radiation therapy, chemotherapy and lumpectomy  . 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    PAST SURGICAL HISTORY: Past Surgical History  Procedure Laterality Date  . Tonsillectomy    . Breast lumpectomy  04/2001    excision of L breast fibroadenoma by Dr.   . Breast lumpectomy  05/16/2001    Right axillary sentinel lymph node biopsy.  . Portacath placement  09/19/2012    Procedure: INSERTION PORT-A-CATH;  Surgeon: [name], MD;  Location: [facility] OR;  Service: General;  Laterality: N/A;    FAMILY HISTORY Family History  Problem Relation Age of  Onset  . Diabetes Mother   . Hypertension Father   . Hypertension Sister   . Hypothyroidism Brother   . Cancer Neg Hx     The patient's father died from heart disease at the age of 93. The patient's mother died 2012/08/05 at the age of 24 also from heart disease. The  patient has 2 sisters and 2 brothers. There is no history of breast or ovarian cancer in the family to her knowledge.  GYNECOLOGIC HISTORY: G1 P1, first live birth age 69. She underwent menopause at the time of her chemotherapy in 2002. She did not use hormone replacement.  SOCIAL HISTORY: The patient is divorced and lives by herself. Her son Zoeie Ritter lives in [place], where he works as an Publishing copy. The patient has no grandchildren. She attends the local [employer].   ADVANCED DIRECTIVES: The patient tells me (11/01/2012) her healthcare power of attorney is her sister Keydi Giel. She was unable to give me her contact information today  HEALTH MAINTENANCE: History  Substance Use Topics  . Smoking status: Never Smoker   . Smokeless tobacco: Never Used  . Alcohol Use: No     Colonoscopy: never  PAP: 08/17/2012  Bone density: never  Lipid panel: Not on file  No Known Allergies  Current Outpatient Prescriptions  Medication Sig Dispense Refill  . acetaminophen (TYLENOL) 500 MG tablet Take 500 mg by mouth every 4 (four) hours as needed. For pain.      Marland Kitchen aspirin 325 MG tablet Take 325 mg by mouth every other day.      . cholecalciferol 2000 UNITS tablet Take 1 tablet (2,000 Units total) by mouth daily.      Marland Kitchen levothyroxine (SYNTHROID, LEVOTHROID) 75 MCG tablet Take 1 tablet (75 mcg total) by mouth daily. (dosage change)  90 tablet  1  . lidocaine-prilocaine (EMLA) cream Apply topically as needed. Apply to port 1-2 hours before procedure  30 g  0  . loratadine (CLARITIN) 10 MG tablet Take 1 tablet (10 mg total) by mouth daily.  30 tablet  11  . LORazepam (ATIVAN) 0.5 MG tablet Take 1 tablet (0.5 mg total) by mouth 2 (two) times daily as needed for anxiety.  30 tablet  1  . prochlorperazine (COMPAZINE) 10 MG tablet Take 1 tablet (10 mg total) by mouth every 6 (six) hours as needed.  30 tablet  2  . traMADol (ULTRAM) 50 MG tablet Take 1 tablet (50 mg total) by mouth every 8 (eight)  hours as needed for pain.  30 tablet  1   No current facility-administered medications for this visit.   Facility-Administered Medications Ordered in Other Visits  Medication Dose Route Frequency Provider Last Rate Last Dose  . heparin lock flush 100 unit/mL  500 Units Intracatheter Once PRN Lowella Dell, MD      . sodium chloride 0.9 % injection 10 mL  10 mL Intracatheter PRN Lowella Dell, MD        OBJECTIVE:  There were no vitals filed for this visit.  General appearance: Alert, cooperative, well nourished, no apparent distress Head: Normocephalic, without obvious abnormality, atraumatic Eyes: Conjunctivae/corneas clear, PERRLA, EOMI Nose: Nares, septum and mucosa are normal, no drainage or sinus tenderness. Neck: No adenopathy, supple, symmetrical, trachea midline, thyroid not enlarged, no tenderness Resp: Clear to auscultation bilaterally Cardio: Regular rate and rhythm, S1, S2 normal, no murmur, click, rub or gallop, left chest Port-A-Cath accessed at the time of examination Breasts:  Deferred GI: Soft, distended, non-tender, hypoactive bowel sounds, no organomegaly from a seated position Extremities: Extremities normal, atraumatic, no cyanosis or edema, limited strength on RUE Lymph nodes: Cervical, supraclavicular, and axillary nodes normal Neurologic: Grossly normal   LAB RESULTS: Lab Results  Component Value Date   WBC 4.5 12/27/2012   NEUTROABS 2.4 12/27/2012   HGB 10.9* 12/27/2012   HCT 33.6* 12/27/2012   MCV 81.0 12/27/2012   PLT 254 12/27/2012      Chemistry      Component Value Date/Time   NA 139 12/14/2012 1356   NA 138 09/18/2012 1531   K 3.9 12/14/2012 1356   K 3.7 09/18/2012 1531   CL 106 12/14/2012 1356   CL 103 09/18/2012 1531   CO2 23 12/14/2012 1356   CO2 23 09/18/2012 1531   BUN 9.7 12/14/2012 1356   BUN 9 09/18/2012 1531   CREATININE 0.8 12/14/2012 1356   CREATININE 0.70 09/18/2012 1531   CREATININE 0.74 03/01/2012 1715      Component Value  Date/Time   CALCIUM 9.6 12/14/2012 1356   CALCIUM 9.8 09/18/2012 1531   ALKPHOS 77 12/14/2012 1356   ALKPHOS 81 09/18/2012 1531   AST 13 12/14/2012 1356   AST 18 09/18/2012 1531   ALT 12 12/14/2012 1356   ALT 13 09/18/2012 1531   BILITOT 0.33 12/14/2012 1356   BILITOT 0.3 09/18/2012 1531       Lab Results  Component Value Date   LABCA2 34 10/04/2012     STUDIES: Mr Laqueta Jean RU Contrast 11/27/2012  *RADIOLOGY REPORT*  Clinical Data: Breast cancer.  Dizziness. Question of intracranial metastatic disease.  MRI HEAD WITHOUT AND WITH CONTRAST  Technique:  Multiplanar, multiecho pulse sequences of the brain and surrounding structures were obtained according to standard protocol without and with intravenous contrast  Contrast: 11mL MULTIHANCE GADOBENATE DIMEGLUMINE 529 MG/ML IV SOLN  Comparison: 05/11/2012  Findings: No intracranial enhancing lesion to suggest presence of intracranial metastatic disease.  The heterogeneous appearance of bone marrow involving the clivus and upper cervical spine was noted prior exam and is without change.  This may be normal for this patient rather representing infiltration by tumor.  Mild left-sided C5-6 cervical spondylotic changes and spinal stenosis.  Asymmetric partially empty sella stable.  No acute infarct.  No intracranial hemorrhage.  Major intracranial vascular structures are patent.  Minimal paranasal sinus mucosal thickening.  IMPRESSION: No evidence of intracranial metastatic disease.  Please see above.   Original Report Authenticated By: Lacy Duverney, M.D.    Nm Pet Image Restag (ps) Skull Base To Thigh 11/27/2012  *RADIOLOGY REPORT*  Clinical Data: Subsequent treatment strategy for stage IV breast cancer. Abraxane therapy.  NUCLEAR MEDICINE PET SKULL BASE TO THIGH  Fasting Blood Glucose:  110  Technique:  18.5 mCi F-18 FDG was injected intravenously. CT data was obtained and used for attenuation correction and anatomic localization only.  (This was not  acquired as a diagnostic CT examination.) Additional exam technical data entered on technologist worksheet.  Comparison:  PET CT 09/12/2012.  Findings:  Neck: No hypermetabolic lymph nodes in the neck.  Chest:  The irregular right axillary mass appears slightly smaller, measuring 1.7 x 1.4 cm on image 55.  It demonstrates less hypermetabolic activity (SUV max 5.8; previously 9.7).  Right paratracheal, subcarinal and bilateral hilar nodal metastases have also improved, all smaller and with less hypermetabolic activity. For example, the right paratracheal node has an SUV max of 7.0 compared with 11.4 previously.  The  previously demonstrated left lung nodules are all smaller and without residual hypermetabolic activity.  No new lesions are identified.  A small pericardial effusion is unchanged.  Abdomen/Pelvis:  No abnormal hypermetabolic activity within the liver, pancreas, adrenal glands, or spleen.  No hypermetabolic lymph nodes in the abdomen or pelvis.  Skeleton:  It is difficult to exclude a small hypermetabolic focus within the anterior cortex of the proximal left femoral diaphysis. This has an SUV max of 3.6.  There is no clear corresponding lytic or blastic lesion on the CT images.  There is no other evidence of osseous metastatic disease.  IMPRESSION:  1.  Interval decrease size and metabolic activity of the right axillary mass, multiple mediastinal and hilar lymph nodes and left lung nodules consistent with a pulse response to interval therapy. 2.  No evidence of metastatic disease within the abdomen or pelvis. 3.  No definite osseous metastases. However, a tiny cortical lesion involving the proximal left femoral diaphysis anteriorly is difficult to exclude.   Original Report Authenticated By: Carey Bullocks, M.D.     ASSESSMENT: 62 y.o. [place] woman originally from [place] with stage IV breast cancer:  (1) Status post right lumpectomy and sentinel lymph node sampling 05/16/2001 for a pT1c pN1, stage  IIA invasive ductal carcinoma, grade 2, estrogen receptor 95% and progesterone receptor 96% positive, with an MIB-1 of 11% and no HER-2 amplification by immunocytochemistry.  (2) Treated adjuvantly with Cyclophosphamide, Methotrexate and Fluorouracil, likely for 8 cycles (data unavailable), completed February 2003.  (3) Completed radiation to the right breast and regional lymph nodes 02/12/2002.  (4) The patient apparently refused completion axillary lymph node dissection and antiestrogen therapy (data unavailable).  (5) Recurrent disease documented by right axillary lymph node biopsy 08/23/2012, showing a high-grade invasive ductal carcinoma which was estrogen and progesterone receptor negative, with an MIB-1 of 59%, and no HER-2 amplification; staging studies show hyper metabolic mediastinal and left lung lesions, with no involvement of the bones, liver or brain  (6) Abraxane every 2 weeks started 09/20/2012, with good tolerance and initial evidence of response.   PLAN:  Debbie Larsen will proceed to treatment today as scheduled for her eighth  every 2 week dose of Abraxane. She will have her labs drawn with port access in the infusion room. She met with the genetics counselor  on 12/04/2012. She declined genetic testing.  Debbie Larsen had originally been scheduled for restaging scans following her sixth dose, but she did not want to wait.  Debbie Larsen had a PET scan on 11/27/2012 and MRI of the brain with and without contrast on 11/27/2012.  Please refer to the imaging results listed above.  The patient was recently seen at [facility] regarding medical trials.  Unfortunately we do not currently have a study available here that Debbie Larsen.   We plan to see Debbie Larsen again on 01/10/2013 at which time she will be due for another Abraxane infusion if her laboratories are adequate.  We will check a CBC and CMP at that time.  Debbie Larsen voices understanding and agreement with this  plan, and will call with any changes or problems.   Larina Bras, NP-C 12/27/2012   6:15 pm

## 2012-12-27 NOTE — Patient Instructions (Addendum)
[facility] Discharge Instructions for Patients Receiving Chemotherapy  Today you received the following chemotherapy agents   To help prevent nausea and vomiting after your treatment, we encourage you to take your nausea medication  and take it as often as prescribed   If you develop nausea and vomiting that is not controlled by your nausea medication, call the clinic. If it is after clinic hours your family physician or the after hours number for the clinic or go to the Emergency Department.   BELOW ARE SYMPTOMS THAT SHOULD BE REPORTED IMMEDIATELY:  *FEVER GREATER THAN 100.5 F  *CHILLS WITH OR WITHOUT FEVER  NAUSEA AND VOMITING THAT IS NOT CONTROLLED WITH YOUR NAUSEA MEDICATION  *UNUSUAL SHORTNESS OF BREATH  *UNUSUAL BRUISING OR BLEEDING  TENDERNESS IN MOUTH AND THROAT WITH OR WITHOUT PRESENCE OF ULCERS  *URINARY PROBLEMS  *BOWEL PROBLEMS  UNUSUAL RASH Items with * indicate a potential emergency and should be followed up as soon as possible.  One of the nurses will contact you 24 hours after your treatment. Please let the nurse know about any problems that you may have experienced. Feel free to call the clinic you have any questions or concerns. The clinic phone number is [phone].   I have been informed and understand all the instructions given to me. I know to contact the clinic, my physician, or go to the Emergency Department if any problems should occur. I do not have any questions at this time, but understand that I may call the clinic during office hours   should I have any questions or need assistance in obtaining follow up care.    __________________________________________  _____________  __________ Signature of Patient or Authorized Representative            Date                   Time    __________________________________________ Nurse's Signature    

## 2012-12-28 ENCOUNTER — Telehealth: Payer: Self-pay

## 2012-12-28 NOTE — Telephone Encounter (Signed)
Called and spoke with pt regarding lab results. Pt voiced understanding and had no questions at this time. TMB

## 2013-01-10 ENCOUNTER — Ambulatory Visit (HOSPITAL_BASED_OUTPATIENT_CLINIC_OR_DEPARTMENT_OTHER): Payer: Medicaid Other

## 2013-01-10 ENCOUNTER — Encounter: Payer: Self-pay | Admitting: Physician Assistant

## 2013-01-10 ENCOUNTER — Ambulatory Visit (HOSPITAL_BASED_OUTPATIENT_CLINIC_OR_DEPARTMENT_OTHER): Payer: Medicaid Other | Admitting: Physician Assistant

## 2013-01-10 ENCOUNTER — Other Ambulatory Visit (HOSPITAL_BASED_OUTPATIENT_CLINIC_OR_DEPARTMENT_OTHER): Payer: Medicaid Other | Admitting: Lab

## 2013-01-10 ENCOUNTER — Other Ambulatory Visit: Payer: Medicaid Other | Admitting: Lab

## 2013-01-10 DIAGNOSIS — C50919 Malignant neoplasm of unspecified site of unspecified female breast: Secondary | ICD-10-CM

## 2013-01-10 DIAGNOSIS — Z5111 Encounter for antineoplastic chemotherapy: Secondary | ICD-10-CM

## 2013-01-10 DIAGNOSIS — C773 Secondary and unspecified malignant neoplasm of axilla and upper limb lymph nodes: Secondary | ICD-10-CM

## 2013-01-10 DIAGNOSIS — Z170 Estrogen receptor positive status [ER+]: Secondary | ICD-10-CM

## 2013-01-10 DIAGNOSIS — M25511 Pain in right shoulder: Secondary | ICD-10-CM

## 2013-01-10 DIAGNOSIS — C50219 Malignant neoplasm of upper-inner quadrant of unspecified female breast: Secondary | ICD-10-CM

## 2013-01-10 DIAGNOSIS — R911 Solitary pulmonary nodule: Secondary | ICD-10-CM

## 2013-01-10 LAB — CBC WITH DIFFERENTIAL/PLATELET
Basophils Absolute: 0 10*3/uL (ref 0.0–0.1)
EOS%: 0.6 % (ref 0.0–7.0)
Eosinophils Absolute: 0 10*3/uL (ref 0.0–0.5)
HGB: 10.9 g/dL — ABNORMAL LOW (ref 11.6–15.9)
MCH: 26.5 pg (ref 25.1–34.0)
NEUT#: 2.8 10*3/uL (ref 1.5–6.5)
RDW: 15.6 % — ABNORMAL HIGH (ref 11.2–14.5)
WBC: 5.2 10*3/uL (ref 3.9–10.3)
lymph#: 1.9 10*3/uL (ref 0.9–3.3)

## 2013-01-10 LAB — COMPREHENSIVE METABOLIC PANEL (CC13)
AST: 13 U/L (ref 5–34)
Albumin: 3.7 g/dL (ref 3.5–5.0)
BUN: 11.4 mg/dL (ref 7.0–26.0)
Calcium: 9.4 mg/dL (ref 8.4–10.4)
Chloride: 108 mEq/L — ABNORMAL HIGH (ref 98–107)
Potassium: 4.1 mEq/L (ref 3.5–5.1)

## 2013-01-10 MED ORDER — HEPARIN SOD (PORK) LOCK FLUSH 100 UNIT/ML IV SOLN
500.0000 [IU] | Freq: Once | INTRAVENOUS | Status: AC | PRN
  Administered 2013-01-10: 500 [IU]
  Filled 2013-01-10: qty 5

## 2013-01-10 MED ORDER — PACLITAXEL PROTEIN-BOUND CHEMO INJECTION 100 MG
100.0000 mg/m2 | Freq: Once | INTRAVENOUS | Status: AC
  Administered 2013-01-10: 150 mg via INTRAVENOUS
  Filled 2013-01-10: qty 30

## 2013-01-10 MED ORDER — SODIUM CHLORIDE 0.9 % IV SOLN: Freq: Once | INTRAVENOUS | Status: DC

## 2013-01-10 MED ORDER — SODIUM CHLORIDE 0.9 % IJ SOLN
10.0000 mL | INTRAMUSCULAR | Status: DC | PRN
  Administered 2013-01-10: 10 mL
  Filled 2013-01-10: qty 10

## 2013-01-10 MED ORDER — DEXAMETHASONE SODIUM PHOSPHATE 10 MG/ML IJ SOLN
10.0000 mg | Freq: Once | INTRAMUSCULAR | Status: AC
  Administered 2013-01-10: 10 mg via INTRAVENOUS

## 2013-01-10 MED ORDER — ONDANSETRON 8 MG/50ML IVPB (CHCC)
8.0000 mg | Freq: Once | INTRAVENOUS | Status: AC
  Administered 2013-01-10: 8 mg via INTRAVENOUS

## 2013-01-10 NOTE — Progress Notes (Signed)
ID: Debbie Larsen   DOB: 05/22/1951  MR#: [id number]  NWG#:[id number]  PCP: [name], MD SU: [name], MD OTHER MD: Nicholes Mango, MD, [name], MD   HISTORY OF PRESENT ILLNESS: The patient's 2002-2004 records have been retrieved. The sugar the patient underwent a right breast core biopsy 03/29/2001 for an invasive ductal carcinoma associated with necrosis ([id number]). 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 The tumor was estrogen receptor 95% positive, progesterone receptor 96% positive, with an MIB-1 of 11%, and no HER-2 amplification by the HercepTest. She completed 6 cycles of CMF chemotherapy on 12/12/2001. There were minor delays, but no dose reductions. She received radiation to the right breast and regional lymph nodes to a total of 5040 cGy with a right breast boost of 12 [name], completed 02/12/2002  More recently, "Debbie Larsen" develop some right shoulder pain sometime in late 2012. She had followup mammograms of until 2011. There was no mammogram performed in 2012. She was seen by physical therapy in orthopedic surgery for evaluation of her right shoulder pain. She had a mammogram on 07/24/2012 which showed a suspicious mass behind eczema of the right breast.  Physical exam the time did show multiple involved right axillary lymph nodes. 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 HER-2 nonamplified the ratio by [facility] being 1.43. Proliferative index was  50%.   MRI scan of both breasts performed on 08/31/2012 showed multiple involved lymph nodes in the right axilla, no obvious masses in either breast. Unfortunately staging PET scan 09/12/2012 showed in addition to her right axillary recurrence, bilateral mediastinal and left lung nodules  consistent with stage IV disease. Her subsequent history is as detailed below.  INTERVAL HISTORY: [name] returns today for followup of her metastatic breast cancer. She continues to receive Abraxane every 2 weeks and is due for her ninth dose today.   She tolerates treatment reasonably well. She does feel little dizzy for 2-3 days following treatment, then that resolves. She's also had some increased tearing for which she has used over-the-counter eyedrops. This also tends to occur for 2-3 days following treatment, then resolves. She has some numbness in her right hand associated with neuropathic pain, but has no increased signs of peripheral neuropathy. She continues to have some pain and weakness in the right upper Chairman the as well.  Interval history is remarkable for the fact that Debbie Larsen was recently evaluated by Dr. Kandice Hams at [facility] Dr. Amie Critchley has ordered several staging studies to be obtained over the next couple of weeks, after which Debbie Larsen will return for a followup visit.  REVIEW OF SYSTEMS: Debbie Larsen denies any fevers or chills. She's had no skin changes. She denies any signs of abnormal bruising or bleeding. She tells me she is keeping herself well hydrated and denies any nausea or emesis. She's having regular bowel movements, and utilizes milk of magnesia as needed for mild constipation. She has no new cough, no phlegm production, and has shortness of breath only with exertion. She denies orthopnea. She's had no chest pain or palpitations.  A detailed review of systems is otherwise stable and noncontributory.   PAST MEDICAL HISTORY: Past Medical History  Diagnosis Date  . 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 Hypothyroid   . Enlarged lymph nodes   .  BREAST CANCER 04/2001    s/p radiation therapy, chemotherapy and lumpectomy  . 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    PAST SURGICAL HISTORY: Past  Surgical History  Procedure Laterality Date  . Tonsillectomy    . Breast lumpectomy  04/2001    excision of L breast fibroadenoma by Dr.   . Breast lumpectomy  05/16/2001    Right axillary sentinel lymph node biopsy.  . Portacath placement  09/19/2012    Procedure: INSERTION PORT-A-CATH;  Surgeon: [name], MD;  Location: [facility] OR;  Service: General;  Laterality: N/A;    FAMILY HISTORY Family History  Problem Relation Age of Onset  . Diabetes Mother   . Hypertension Father   . Hypertension Sister   . Hypothyroidism Brother   . Cancer Neg Hx     The patient's father died from heart disease at the age of 23. The patient's mother died 10-Aug-2012 at the age of 63 also from heart disease. The patient has 2 sisters and 2 brothers. There is no history of breast or ovarian cancer in the family to her knowledge.  GYNECOLOGIC HISTORY: G1 P1, first live birth age 86. She underwent menopause at the time of her chemotherapy in 2002. She did not use hormone replacement.  SOCIAL HISTORY: The patient is divorced and lives by herself. Her son Debbie Larsen lives in [place], where he works as an Publishing copy. The patient has no grandchildren. She attends the local [employer].   ADVANCED DIRECTIVES: The patient tells me (11/01/2012) her healthcare power of attorney is her sister Debbie Larsen. She was unable to give me her contact information today  HEALTH MAINTENANCE: History  Substance Use Topics  . Smoking status: Never Smoker   . Smokeless tobacco: Never Used  . Alcohol Use: No     Colonoscopy: never  PAP: 08/17/2012  Bone density: never  Lipid panel: Not on file  No Known Allergies  Current Outpatient Prescriptions  Medication Sig Dispense Refill  . acetaminophen (TYLENOL) 500 MG tablet Take 500 mg by mouth every 4 (four) hours as needed. For pain.      Marland Kitchen aspirin 325 MG tablet Take 325 mg by mouth every other day.      . cholecalciferol 2000 UNITS tablet Take 1 tablet (2,000  Units total) by mouth daily.      Marland Kitchen levothyroxine (SYNTHROID, LEVOTHROID) 75 MCG tablet Take 1 tablet (75 mcg total) by mouth daily. (dosage change)  90 tablet  1  . lidocaine-prilocaine (EMLA) cream Apply topically as needed. Apply to port 1-2 hours before procedure  30 g  0  . loratadine (CLARITIN) 10 MG tablet Take 1 tablet (10 mg total) by mouth daily.  30 tablet  11  . LORazepam (ATIVAN) 0.5 MG tablet Take 1 tablet (0.5 mg total) by mouth 2 (two) times daily as needed for anxiety.  30 tablet  1  . prochlorperazine (COMPAZINE) 10 MG tablet Take 1 tablet (10 mg total) by mouth every 6 (six) hours as needed.  30 tablet  2  . traMADol (ULTRAM) 50 MG tablet Take 1 tablet (50 mg total) by mouth every 8 (eight) hours as needed for pain.  30 tablet  1   No current facility-administered medications for this visit.    OBJECTIVE:  Filed Vitals:   01/10/13 1410  BP: 143/85  Pulse: 81  Temp: 98.5 F (36.9 C)  Resp:  20  Body mass index is 23.67 kg/(m^2). ECOG: 1 Filed Vitals:   01/10/13 1410  Height: 5' (1.524 m)  Weight: 121 lb 3.2 oz (54.976 kg)   Eyes: Sclerae anicteric  Oropharynx is clear with no mucositis or evidence of candidiasis. Nodes: No cervical, supraclavicular, or palpable axillary adenopathy noted today. Resp: Clear to auscultation bilaterally, no crackles, rhonchi, or wheezes Cardio: Regular rate and rhythm, Port is intact in the left upper chest wall, currently accessed, with no erythema or evidence of infection. Breasts:  Deferred GI: Soft, mildly distended, non-tender to palpation,  positive bowel sounds Extremities: No peripheral edema. Range of motion and strength are both decrease in the right upper extremity. Neuro: Nonfocal, well oriented, with anxious affect  LAB RESULTS: Lab Results  Component Value Date   WBC 5.2 01/10/2013   NEUTROABS 2.8 01/10/2013   HGB 10.9* 01/10/2013   HCT 33.4* 01/10/2013   MCV 81.3 01/10/2013   PLT 253 01/10/2013      Chemistry       Component Value Date/Time   NA 141 01/10/2013 1257   NA 138 09/18/2012 1531   K 4.1 01/10/2013 1257   K 3.7 09/18/2012 1531   CL 108* 01/10/2013 1257   CL 103 09/18/2012 1531   CO2 24 01/10/2013 1257   CO2 23 09/18/2012 1531   BUN 11.4 01/10/2013 1257   BUN 9 09/18/2012 1531   CREATININE 0.8 01/10/2013 1257   CREATININE 0.70 09/18/2012 1531   CREATININE 0.74 03/01/2012 1715      Component Value Date/Time   CALCIUM 9.4 01/10/2013 1257   CALCIUM 9.8 09/18/2012 1531   ALKPHOS 77 01/10/2013 1257   ALKPHOS 81 09/18/2012 1531   AST 13 01/10/2013 1257   AST 18 09/18/2012 1531   ALT 13 01/10/2013 1257   ALT 13 09/18/2012 1531   BILITOT 0.29 01/10/2013 1257   BILITOT 0.3 09/18/2012 1531       Lab Results  Component Value Date   LABCA2 27 12/27/2012     STUDIES: Mr Laqueta Jean NF Contrast 11/27/2012  *RADIOLOGY REPORT*  Clinical Data: Breast cancer.  Dizziness. Question of intracranial metastatic disease.  MRI HEAD WITHOUT AND WITH CONTRAST  Technique:  Multiplanar, multiecho pulse sequences of the brain and surrounding structures were obtained according to standard protocol without and with intravenous contrast  Contrast: 11mL MULTIHANCE GADOBENATE DIMEGLUMINE 529 MG/ML IV SOLN  Comparison: 05/11/2012  Findings: No intracranial enhancing lesion to suggest presence of intracranial metastatic disease.  The heterogeneous appearance of bone marrow involving the clivus and upper cervical spine was noted prior exam and is without change.  This may be normal for this patient rather representing infiltration by tumor.  Mild left-sided C5-6 cervical spondylotic changes and spinal stenosis.  Asymmetric partially empty sella stable.  No acute infarct.  No intracranial hemorrhage.  Major intracranial vascular structures are patent.  Minimal paranasal sinus mucosal thickening.  IMPRESSION: No evidence of intracranial metastatic disease.  Please see above.   Original Report Authenticated By: Lacy Duverney, M.D.    Nm  Pet Image Restag (ps) Skull Base To Thigh 11/27/2012  *RADIOLOGY REPORT*  Clinical Data: Subsequent treatment strategy for stage IV breast cancer. Abraxane therapy.  NUCLEAR MEDICINE PET SKULL BASE TO THIGH  Fasting Blood Glucose:  110  Technique:  18.5 mCi F-18 FDG was injected intravenously. CT data was obtained and used for attenuation correction and anatomic localization only.  (This was not acquired as a diagnostic CT examination.) Additional exam technical data  entered on technologist worksheet.  Comparison:  PET CT 09/12/2012.  Findings:  Neck: No hypermetabolic lymph nodes in the neck.  Chest:  The irregular right axillary mass appears slightly smaller, measuring 1.7 x 1.4 cm on image 55.  It demonstrates less hypermetabolic activity (SUV max 5.8; previously 9.7).  Right paratracheal, subcarinal and bilateral hilar nodal metastases have also improved, all smaller and with less hypermetabolic activity. For example, the right paratracheal node has an SUV max of 7.0 compared with 11.4 previously.  The previously demonstrated left lung nodules are all smaller and without residual hypermetabolic activity.  No new lesions are identified.  A small pericardial effusion is unchanged.  Abdomen/Pelvis:  No abnormal hypermetabolic activity within the liver, pancreas, adrenal glands, or spleen.  No hypermetabolic lymph nodes in the abdomen or pelvis.  Skeleton:  It is difficult to exclude a small hypermetabolic focus within the anterior cortex of the proximal left femoral diaphysis. This has an SUV max of 3.6.  There is no clear corresponding lytic or blastic lesion on the CT images.  There is no other evidence of osseous metastatic disease.  IMPRESSION:  1.  Interval decrease size and metabolic activity of the right axillary mass, multiple mediastinal and hilar lymph nodes and left lung nodules consistent with a pulse response to interval therapy. 2.  No evidence of metastatic disease within the abdomen or pelvis. 3.   No definite osseous metastases. However, a tiny cortical lesion involving the proximal left femoral diaphysis anteriorly is difficult to exclude.   Original Report Authenticated By: Carey Bullocks, M.D.     ASSESSMENT: 62 y.o. [place] woman originally from [place] with stage IV breast cancer:  (1) Status post right lumpectomy and sentinel lymph node sampling 05/16/2001 for a pT1c pN1, stage IIA invasive ductal carcinoma, grade 2, estrogen receptor 95% and progesterone receptor 96% positive, with an MIB-1 of 11% and no HER-2 amplification by immunocytochemistry.  (2) Treated adjuvantly with Cyclophosphamide, Methotrexate and Fluorouracil, likely for 8 cycles (data unavailable), completed February 2003.  (3) Completed radiation to the right breast and regional lymph nodes 02/12/2002.  (4) The patient apparently refused completion axillary lymph node dissection and antiestrogen therapy (data unavailable).  (5) Recurrent disease documented by right axillary lymph node biopsy 08/23/2012, showing a high-grade invasive ductal carcinoma which was estrogen and progesterone receptor negative, with an MIB-1 of 59%, and no HER-2 amplification; staging studies show hyper metabolic mediastinal and left lung lesions, with no involvement of the bones, liver or brain  (6) Abraxane every 2 weeks started 09/20/2012, with good tolerance and initial evidence of response.   PLAN:  Ms. Bailey will proceed to treatment today as scheduled for her ninth every 2 week dose of Abraxane. She'll receive treatment again in 2 weeks, and return to see Dr. Darnelle Catalan in 4 weeks for followup visit. In the meanwhile, she is already scheduled for repeat scans at [facility], after which she will be following up with Dr. Amie Critchley.  Over half of our 50 minute appointment today was spent reviewing the patient's concerns, answering questions, reviewing her treatment plan, and coordinating care. Ms. Rossa voices understanding and agreement with  this plan, and will call with any changes or problems.   BERRY,AMY, PA-C 01/10/2013   6:15 pm

## 2013-01-10 NOTE — Patient Instructions (Addendum)

## 2013-01-14 ENCOUNTER — Other Ambulatory Visit: Payer: Medicaid Other | Admitting: Lab

## 2013-01-14 ENCOUNTER — Telehealth: Payer: Self-pay | Admitting: Oncology

## 2013-01-14 ENCOUNTER — Encounter: Payer: Medicaid Other | Admitting: Genetic Counselor

## 2013-01-14 ENCOUNTER — Other Ambulatory Visit: Payer: Self-pay | Admitting: Physician Assistant

## 2013-01-14 NOTE — Telephone Encounter (Signed)
Per 3/17 pof moved 4/10 lb/flush to 4/9 and added appt w/GM @ 4pm. tx remains on 4/10 per pof. S/w pt she is aware and will get new schedule 3/27.

## 2013-01-24 ENCOUNTER — Other Ambulatory Visit (HOSPITAL_BASED_OUTPATIENT_CLINIC_OR_DEPARTMENT_OTHER): Payer: Medicaid Other | Admitting: Lab

## 2013-01-24 ENCOUNTER — Ambulatory Visit (HOSPITAL_BASED_OUTPATIENT_CLINIC_OR_DEPARTMENT_OTHER): Payer: Medicaid Other

## 2013-01-24 VITALS — BP 135/82 | HR 64 | Temp 98.2°F | Resp 18

## 2013-01-24 DIAGNOSIS — C50919 Malignant neoplasm of unspecified site of unspecified female breast: Secondary | ICD-10-CM

## 2013-01-24 DIAGNOSIS — C773 Secondary and unspecified malignant neoplasm of axilla and upper limb lymph nodes: Secondary | ICD-10-CM

## 2013-01-24 DIAGNOSIS — Z5111 Encounter for antineoplastic chemotherapy: Secondary | ICD-10-CM

## 2013-01-24 LAB — COMPREHENSIVE METABOLIC PANEL (CC13)
Alkaline Phosphatase: 77 U/L (ref 40–150)
BUN: 12.8 mg/dL (ref 7.0–26.0)
CO2: 24 mEq/L (ref 22–29)
Creatinine: 0.8 mg/dL (ref 0.6–1.1)
Glucose: 117 mg/dl — ABNORMAL HIGH (ref 70–99)
Total Bilirubin: 0.41 mg/dL (ref 0.20–1.20)
Total Protein: 7.2 g/dL (ref 6.4–8.3)

## 2013-01-24 LAB — CBC WITH DIFFERENTIAL/PLATELET
Eosinophils Absolute: 0 10*3/uL (ref 0.0–0.5)
HCT: 33.1 % — ABNORMAL LOW (ref 34.8–46.6)
LYMPH%: 35.1 % (ref 14.0–49.7)
MCV: 81 fL (ref 79.5–101.0)
MONO#: 0.5 10*3/uL (ref 0.1–0.9)
MONO%: 8.8 % (ref 0.0–14.0)
NEUT#: 3.2 10*3/uL (ref 1.5–6.5)
NEUT%: 54.9 % (ref 38.4–76.8)
Platelets: 238 10*3/uL (ref 145–400)
RBC: 4.09 10*6/uL (ref 3.70–5.45)
WBC: 5.8 10*3/uL (ref 3.9–10.3)

## 2013-01-24 MED ORDER — SODIUM CHLORIDE 0.9 % IV SOLN
Freq: Once | INTRAVENOUS | Status: AC
  Administered 2013-01-24: 13:00:00 via INTRAVENOUS

## 2013-01-24 MED ORDER — ONDANSETRON 8 MG/50ML IVPB (CHCC)
8.0000 mg | Freq: Once | INTRAVENOUS | Status: AC
  Administered 2013-01-24: 8 mg via INTRAVENOUS

## 2013-01-24 MED ORDER — SODIUM CHLORIDE 0.9 % IJ SOLN
10.0000 mL | INTRAMUSCULAR | Status: DC | PRN
  Administered 2013-01-24: 10 mL
  Filled 2013-01-24: qty 10

## 2013-01-24 MED ORDER — HEPARIN SOD (PORK) LOCK FLUSH 100 UNIT/ML IV SOLN
500.0000 [IU] | Freq: Once | INTRAVENOUS | Status: AC | PRN
  Administered 2013-01-24: 500 [IU]
  Filled 2013-01-24: qty 5

## 2013-01-24 MED ORDER — PACLITAXEL PROTEIN-BOUND CHEMO INJECTION 100 MG
100.0000 mg/m2 | Freq: Once | INTRAVENOUS | Status: AC
  Administered 2013-01-24: 150 mg via INTRAVENOUS
  Filled 2013-01-24: qty 30

## 2013-01-24 MED ORDER — DEXAMETHASONE SODIUM PHOSPHATE 10 MG/ML IJ SOLN
10.0000 mg | Freq: Once | INTRAMUSCULAR | Status: AC
  Administered 2013-01-24: 10 mg via INTRAVENOUS

## 2013-01-24 NOTE — Patient Instructions (Addendum)
[facility] Discharge Instructions for Patients Receiving Chemotherapy  Today you received the following chemotherapy agents Abraxane.  To help prevent nausea and vomiting after your treatment, we encourage you to take your nausea medication.  If you develop nausea and vomiting that is not controlled by your nausea medication, call the clinic. If it is after clinic hours your family physician or the after hours number for the clinic or go to the Emergency Department.   BELOW ARE SYMPTOMS THAT SHOULD BE REPORTED IMMEDIATELY:  *FEVER GREATER THAN 100.5 F  *CHILLS WITH OR WITHOUT FEVER  NAUSEA AND VOMITING THAT IS NOT CONTROLLED WITH YOUR NAUSEA MEDICATION  *UNUSUAL SHORTNESS OF BREATH  *UNUSUAL BRUISING OR BLEEDING  TENDERNESS IN MOUTH AND THROAT WITH OR WITHOUT PRESENCE OF ULCERS  *URINARY PROBLEMS  *BOWEL PROBLEMS  UNUSUAL RASH Items with * indicate a potential emergency and should be followed up as soon as possible.  One of the nurses will contact you 24 hours after your treatment. Please let the nurse know about any problems that you may have experienced. Feel free to call the clinic you have any questions or concerns. The clinic phone number is [phone].   I have been informed and understand all the instructions given to me. I know to contact the clinic, my physician, or go to the Emergency Department if any problems should occur. I do not have any questions at this time, but understand that I may call the clinic during office hours   should I have any questions or need assistance in obtaining follow up care.    __________________________________________  _____________  __________ Signature of Patient or Authorized Representative            Date                   Time    __________________________________________ Nurse's Signature

## 2013-02-06 ENCOUNTER — Ambulatory Visit (HOSPITAL_BASED_OUTPATIENT_CLINIC_OR_DEPARTMENT_OTHER): Payer: Medicaid Other | Admitting: Oncology

## 2013-02-06 ENCOUNTER — Other Ambulatory Visit: Payer: Self-pay | Admitting: *Deleted

## 2013-02-06 ENCOUNTER — Other Ambulatory Visit: Payer: Medicaid Other | Admitting: Lab

## 2013-02-06 VITALS — BP 132/83 | HR 85 | Temp 99.4°F | Resp 20 | Ht 60.0 in | Wt 118.6 lb

## 2013-02-06 DIAGNOSIS — F411 Generalized anxiety disorder: Secondary | ICD-10-CM

## 2013-02-06 DIAGNOSIS — C773 Secondary and unspecified malignant neoplasm of axilla and upper limb lymph nodes: Secondary | ICD-10-CM

## 2013-02-06 DIAGNOSIS — C50419 Malignant neoplasm of upper-outer quadrant of unspecified female breast: Secondary | ICD-10-CM

## 2013-02-06 DIAGNOSIS — J984 Other disorders of lung: Secondary | ICD-10-CM

## 2013-02-06 DIAGNOSIS — C50919 Malignant neoplasm of unspecified site of unspecified female breast: Secondary | ICD-10-CM

## 2013-02-06 NOTE — Progress Notes (Signed)
ID: [name]   DOB: 1951/04/13  MR#: [id number]  WUJ#:[id number]  PCP: [name], MD SU: [name], MD OTHER MD: Nicholes Mango, [name], [name]   HISTORY OF PRESENT ILLNESS: The patient's 2002-2004 records have been retrieved. The sugar the patient underwent a right breast core biopsy 03/29/2001 for an invasive ductal carcinoma associated with necrosis ([id number]). 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 The tumor was estrogen receptor 95% positive, progesterone receptor 96% positive, with an MIB-1 of 11%, and no HER-2 amplification by the HercepTest. She completed 6 cycles of CMF chemotherapy on 12/12/2001. There were minor delays, but no dose reductions. She received radiation to the right breast and regional lymph nodes to a total of 5040 cGy with a right breast boost of 12 [name], completed 02/12/2002  More recently, "Debbie Larsen" develop some right shoulder pain sometime in late 2012. She had followup mammograms of until 2011. There was no mammogram performed in 2012. She was seen by physical therapy in orthopedic surgery for evaluation of her right shoulder pain. She had a mammogram on 07/24/2012 which showed a suspicious mass behind eczema of the right breast.  Physical exam the time did show multiple involved right axillary lymph nodes. 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 HER-2 nonamplified the ratio by C[facility] being 1.43. Proliferative index was  50%.   MRI scan of both breasts performed on 08/31/2012 showed multiple involved lymph nodes in the right axilla, no obvious masses in either breast. Unfortunately staging PET scan 09/12/2012 showed in addition to her right axillary recurrence, bilateral mediastinal and left lung  nodules consistent with stage IV disease. Her subsequent history is as detailed below.  INTERVAL HISTORY: Debbie Larsen returns today for followup of her metastatic breast cancer. She is due for her 11th of 12 planned every 2 week Abraxane doses tomorrow. Since the last visit here she was evaluated at [facility], with a bone scan CT scan MRI of the right brachial plexus and EMG. I do not yet have copies of those results.  REVIEW OF SYSTEMS: She feels mildly fatigued. She has aches and pains intermittently. 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 She has some gum disease but no tooth problems that she is aware of. She continues to be anxious, but not depressed. A detailed review of systems today was otherwise noncontributory   PAST MEDICAL HISTORY: Past Medical History  Diagnosis Date  . 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 Hypothyroid   . Enlarged lymph nodes   . BREAST CANCER 04/2001    s/p radiation therapy, chemotherapy and lumpectomy  . 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    PAST SURGICAL HISTORY: Past Surgical History  Procedure Laterality Date  . Tonsillectomy    . Breast lumpectomy  04/2001    excision of L breast fibroadenoma by Dr.   . Breast lumpectomy  05/16/2001    Right axillary sentinel lymph node biopsy.  . Portacath placement  09/19/2012    Procedure: INSERTION PORT-A-CATH;  Surgeon: [name], MD;  Location: [facility] OR;  Service: General;  Laterality: N/A;    FAMILY HISTORY Family History  Problem  Relation Age of Onset  . Diabetes Mother   . Hypertension Father   . Hypertension Sister   . Hypothyroidism Brother   . Cancer Neg Hx     The patient's father died from heart disease at the age of 23. The patient's mother died August 09, 2012 at the age of 20 also from heart disease. The patient has 2 sisters and 2 brothers. There is no  history of breast or ovarian cancer in the family to her knowledge.  GYNECOLOGIC HISTORY: G1 P1, first live birth age 62. She underwent menopause at the time of her chemotherapy in 2002. She did not use hormone replacement.  SOCIAL HISTORY: The patient is divorced and lives by herself. Her son [name] lives in [place], where he works as an Publishing copy. The patient has no grandchildren. She attends the local [employer].   ADVANCED DIRECTIVES: The patient tells me (11/01/2012) her healthcare power of attorney is her sister [name]. She was unable to give me her contact information today  HEALTH MAINTENANCE: History  Substance Use Topics  . Smoking status: Never Smoker   . Smokeless tobacco: Never Used  . Alcohol Use: No     Colonoscopy: never  PAP: 08/17/2012  Bone density: never  Lipid panel: Not on file  No Known Allergies  Current Outpatient Prescriptions  Medication Sig Dispense Refill  . acetaminophen (TYLENOL) 500 MG tablet Take 500 mg by mouth every 4 (four) hours as needed. For pain.      Marland Kitchen aspirin 325 MG tablet Take 325 mg by mouth every other day.      . cholecalciferol 2000 UNITS tablet Take 1 tablet (2,000 Units total) by mouth daily.      Marland Kitchen levothyroxine (SYNTHROID, LEVOTHROID) 75 MCG tablet Take 1 tablet (75 mcg total) by mouth daily. (dosage change)  90 tablet  1  . lidocaine-prilocaine (EMLA) cream Apply topically as needed. Apply to port 1-2 hours before procedure  30 g  0  . loratadine (CLARITIN) 10 MG tablet Take 1 tablet (10 mg total) by mouth daily.  30 tablet  11  . LORazepam (ATIVAN) 0.5 MG tablet Take 1 tablet (0.5 mg total) by mouth 2 (two) times daily as needed for anxiety.  30 tablet  1  . prochlorperazine (COMPAZINE) 10 MG tablet Take 1 tablet (10 mg total) by mouth every 6 (six) hours as needed.  30 tablet  2  . traMADol (ULTRAM) 50 MG tablet Take 1 tablet (50 mg total) by mouth every 8 (eight) hours as needed for pain.  30 tablet  1   No  current facility-administered medications for this visit.    OBJECTIVE: Middle-aged [place] Asian woman in no acute distress  Filed Vitals:   02/06/13 1537  BP: 132/83  Pulse: 85  Temp: 99.4 F (37.4 C)  Resp: 20  Body mass index is 23.16 kg/(m^2). ECOG: 1 Filed Vitals:   02/06/13 1537  Height: 5' (1.524 m)  Weight: 118 lb 9.6 oz (53.797 kg)   Sclerae unicteric Oropharynx clear No cervical or supraclavicular adenopathy Lungs no rales or rhonchi Heart regular rate and rhythm Abd soft, nontender, positive bowel sounds MSK no focal spinal tenderness, no peripheral edema and particularly at nose lymphedema of the right upper extremity noted Neuro: nonfocal, well oriented, some pressure of speech Breasts: The right breast is status post lumpectomy and radiation. I do not palpate a well-defined mass and the right axilla is benign to exam. Left breast is unremarkable  LAB RESULTS: Lab Results  Component Value Date   WBC 5.8 01/24/2013   NEUTROABS 3.2 01/24/2013   HGB 11.2* 01/24/2013   HCT 33.1* 01/24/2013   MCV 81.0 01/24/2013   PLT 238 01/24/2013      Chemistry      Component Value Date/Time   NA 139 01/24/2013 1254   NA 138 09/18/2012 1531   K 4.0 01/24/2013 1254   K 3.7 09/18/2012 1531   CL 107 01/24/2013 1254   CL 103 09/18/2012 1531   CO2 24 01/24/2013 1254   CO2 23 09/18/2012 1531   BUN 12.8 01/24/2013 1254   BUN 9 09/18/2012 1531   CREATININE 0.8 01/24/2013 1254   CREATININE 0.70 09/18/2012 1531   CREATININE 0.74 03/01/2012 1715      Component Value Date/Time   CALCIUM 9.6 01/24/2013 1254   CALCIUM 9.8 09/18/2012 1531   ALKPHOS 77 01/24/2013 1254   ALKPHOS 81 09/18/2012 1531   AST 13 01/24/2013 1254   AST 18 09/18/2012 1531   ALT 11 01/24/2013 1254   ALT 13 09/18/2012 1531   BILITOT 0.41 01/24/2013 1254   BILITOT 0.3 09/18/2012 1531       Lab Results  Component Value Date   LABCA2 27 12/27/2012     STUDIES: No results found.   ASSESSMENT: 62 y.o.  [place] woman originally from [place] with stage IV breast cancer:  (1) Status post right lumpectomy and sentinel lymph node sampling 05/16/2001 for a pT1c pN1, stage IIA invasive ductal carcinoma, grade 2, estrogen receptor 95% and progesterone receptor 96% positive, with an MIB-1 of 11% and no HER-2 amplification by immunocytochemistry.  (2) Treated adjuvantly with Cyclophosphamide, Methotrexate and Fluorouracil, likely for 8 cycles (data unavailable), completed February 2003.  (3) Completed radiation to the right breast and regional lymph nodes 02/12/2002.  (4) The patient apparently refused completion axillary lymph node dissection and antiestrogen therapy (data unavailable).  (5) Recurrent disease documented by right axillary lymph node biopsy 08/23/2012, showing a high-grade invasive ductal carcinoma which was estrogen and progesterone receptor negative, with an MIB-1 of 59%, and no HER-2 amplification; staging studies showed hyper metabolic mediastinal and left lung lesions, with no involvement of the bones, liver or brain  (6) Abraxane every 2 weeks started 09/20/2012, with good tolerance and evidence of response.  (7) genetic testing pending at [facility]   PLAN:  Debbie Larsen had a thorough workup at [facility], but I do not yet have those results or their recommendations. She is going to receive the 11th of 12 planned doses of Abraxane tomorrow, and then the final 1 on April 24. I am going to add zoledronic acid that day. Between now and the next visit here, which will be April 23, we should have the results from [facility] and should be able to come up with a plan for the next 6 months. She particularly wants to know whether it would be a good idea or not to proceed to surgery and whether we will continue some form of treatment or initiate observation. She knows to call for any problems that may develop before the next visit   Lowella Dell, MD  02/06/2013

## 2013-02-07 ENCOUNTER — Ambulatory Visit (HOSPITAL_BASED_OUTPATIENT_CLINIC_OR_DEPARTMENT_OTHER): Payer: Medicaid Other

## 2013-02-07 ENCOUNTER — Other Ambulatory Visit (HOSPITAL_BASED_OUTPATIENT_CLINIC_OR_DEPARTMENT_OTHER): Payer: Medicaid Other | Admitting: Lab

## 2013-02-07 ENCOUNTER — Telehealth: Payer: Self-pay | Admitting: *Deleted

## 2013-02-07 ENCOUNTER — Other Ambulatory Visit: Payer: Medicaid Other | Admitting: Lab

## 2013-02-07 VITALS — BP 116/67 | HR 64 | Temp 98.3°F | Resp 18

## 2013-02-07 DIAGNOSIS — C50219 Malignant neoplasm of upper-inner quadrant of unspecified female breast: Secondary | ICD-10-CM

## 2013-02-07 DIAGNOSIS — Z5111 Encounter for antineoplastic chemotherapy: Secondary | ICD-10-CM

## 2013-02-07 DIAGNOSIS — C50919 Malignant neoplasm of unspecified site of unspecified female breast: Secondary | ICD-10-CM

## 2013-02-07 DIAGNOSIS — C773 Secondary and unspecified malignant neoplasm of axilla and upper limb lymph nodes: Secondary | ICD-10-CM

## 2013-02-07 LAB — COMPREHENSIVE METABOLIC PANEL (CC13)
AST: 13 U/L (ref 5–34)
Alkaline Phosphatase: 78 U/L (ref 40–150)
BUN: 10.1 mg/dL (ref 7.0–26.0)
Creatinine: 0.8 mg/dL (ref 0.6–1.1)
Glucose: 94 mg/dl (ref 70–99)
Total Bilirubin: 0.44 mg/dL (ref 0.20–1.20)

## 2013-02-07 LAB — CBC WITH DIFFERENTIAL/PLATELET
Basophils Absolute: 0 10*3/uL (ref 0.0–0.1)
EOS%: 0.7 % (ref 0.0–7.0)
Eosinophils Absolute: 0 10*3/uL (ref 0.0–0.5)
HGB: 11.1 g/dL — ABNORMAL LOW (ref 11.6–15.9)
LYMPH%: 42 % (ref 14.0–49.7)
MCH: 26.4 pg (ref 25.1–34.0)
MCV: 80.2 fL (ref 79.5–101.0)
MONO%: 12.4 % (ref 0.0–14.0)
NEUT#: 2.4 10*3/uL (ref 1.5–6.5)
Platelets: 254 10*3/uL (ref 145–400)
RDW: 17.4 % — ABNORMAL HIGH (ref 11.2–14.5)

## 2013-02-07 MED ORDER — HEPARIN SOD (PORK) LOCK FLUSH 100 UNIT/ML IV SOLN
500.0000 [IU] | Freq: Once | INTRAVENOUS | Status: AC | PRN
  Administered 2013-02-07: 500 [IU]
  Filled 2013-02-07: qty 5

## 2013-02-07 MED ORDER — DEXAMETHASONE SODIUM PHOSPHATE 10 MG/ML IJ SOLN
10.0000 mg | Freq: Once | INTRAMUSCULAR | Status: AC
  Administered 2013-02-07: 10 mg via INTRAVENOUS

## 2013-02-07 MED ORDER — PACLITAXEL PROTEIN-BOUND CHEMO INJECTION 100 MG
100.0000 mg/m2 | Freq: Once | INTRAVENOUS | Status: AC
  Administered 2013-02-07: 150 mg via INTRAVENOUS
  Filled 2013-02-07: qty 30

## 2013-02-07 MED ORDER — SODIUM CHLORIDE 0.9 % IV SOLN
Freq: Once | INTRAVENOUS | Status: AC
  Administered 2013-02-07: 14:00:00 via INTRAVENOUS

## 2013-02-07 MED ORDER — SODIUM CHLORIDE 0.9 % IJ SOLN
10.0000 mL | INTRAMUSCULAR | Status: DC | PRN
  Administered 2013-02-07: 10 mL
  Filled 2013-02-07: qty 10

## 2013-02-07 MED ORDER — ONDANSETRON 8 MG/50ML IVPB (CHCC)
8.0000 mg | Freq: Once | INTRAVENOUS | Status: AC
  Administered 2013-02-07: 8 mg via INTRAVENOUS

## 2013-02-07 NOTE — Telephone Encounter (Signed)
sw pt gv her appt d/t for 02/20/13 @4 :30pm. The pt is aware and plan on attending.

## 2013-02-07 NOTE — Patient Instructions (Addendum)
[facility] Discharge Instructions for Patients Receiving Chemotherapy  Today you received the following chemotherapy agent Abraxane.  To help prevent nausea and vomiting after your treatment, we encourage you to take your nausea medication. Begin taking your nausea medication as often as prescribed for by Dr. Darnelle Catalan.    If you develop nausea and vomiting that is not controlled by your nausea medication, call the clinic. If it is after clinic hours your family physician or the after hours number for the clinic or go to the Emergency Department.   BELOW ARE SYMPTOMS THAT SHOULD BE REPORTED IMMEDIATELY:  *FEVER GREATER THAN 100.5 F  *CHILLS WITH OR WITHOUT FEVER  NAUSEA AND VOMITING THAT IS NOT CONTROLLED WITH YOUR NAUSEA MEDICATION  *UNUSUAL SHORTNESS OF BREATH  *UNUSUAL BRUISING OR BLEEDING  TENDERNESS IN MOUTH AND THROAT WITH OR WITHOUT PRESENCE OF ULCERS  *URINARY PROBLEMS  *BOWEL PROBLEMS  UNUSUAL RASH Items with * indicate a potential emergency and should be followed up as soon as possible.  One of the nurses will contact you 24 hours after your treatment. Please let the nurse know about any problems that you may have experienced. Feel free to call the clinic you have any questions or concerns. The clinic phone number is [phone].   I have been informed and understand all the instructions given to me. I know to contact the clinic, my physician, or go to the Emergency Department if any problems should occur. I do not have any questions at this time, but understand that I may call the clinic during office hours   should I have any questions or need assistance in obtaining follow up care.    __________________________________________  _____________  __________ Signature of Patient or Authorized Representative            Date                   Time    __________________________________________ Nurse's Signature

## 2013-02-20 ENCOUNTER — Ambulatory Visit (HOSPITAL_BASED_OUTPATIENT_CLINIC_OR_DEPARTMENT_OTHER): Payer: Medicaid Other | Admitting: Oncology

## 2013-02-20 VITALS — BP 123/77 | HR 91 | Temp 98.7°F | Resp 20 | Ht 60.0 in | Wt 117.9 lb

## 2013-02-20 DIAGNOSIS — C50912 Malignant neoplasm of unspecified site of left female breast: Secondary | ICD-10-CM

## 2013-02-20 DIAGNOSIS — C50919 Malignant neoplasm of unspecified site of unspecified female breast: Secondary | ICD-10-CM

## 2013-02-20 NOTE — Progress Notes (Signed)
ID: Debbie Larsen   DOB: 01-23-1951  MR#: [id number]  OZH#:[id number]  PCP: [name], MD SU: [name], MD OTHER MD: Nicholes Mango, [name], [name]   HISTORY OF PRESENT ILLNESS: The patient's 2002-2004 records have been retrieved. The sugar the patient underwent a right breast core biopsy 03/29/2001 for an invasive ductal carcinoma associated with necrosis ([id number]). 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 The tumor was estrogen receptor 95% positive, progesterone receptor 96% positive, with an MIB-1 of 11%, and no HER-2 amplification by the HercepTest. She completed 6 cycles of CMF chemotherapy on 12/12/2001. There were minor delays, but no dose reductions. She received radiation to the right breast and regional lymph nodes to a total of 5040 cGy with a right breast boost of 12 [name], completed 02/12/2002  More recently, "Debbie Larsen" develop some right shoulder pain sometime in late 2012. She had followup mammograms of until 2011. There was no mammogram performed in 2012. She was seen by physical therapy in orthopedic surgery for evaluation of her right shoulder pain. She had a mammogram on 07/24/2012 which showed a suspicious mass behind eczema of the right breast. Physical exam the time did show multiple involved right axillary lymph nodes. 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 HER-2 nonamplified the ratio by CISH being 1.43. Proliferative index was  50%.   MRI scan of both breasts performed on 08/31/2012 showed multiple involved lymph nodes in the right axilla, no obvious masses in either breast. Unfortunately staging PET scan 09/12/2012 showed in addition to her right axillary recurrence, bilateral mediastinal and left lung  nodules consistent with stage IV disease. Her subsequent history is as detailed below.  INTERVAL HISTORY: Debbie Larsen returns today for followup of her metastatic breast cancer. She will receive her 12th and final planned dose of every 2 week Abraxane tomorrow. She is also here to discuss the recommendations from her visits at [facility].  REVIEW OF SYSTEMS: She continues to have weakness and discomfort in the right upper extremity, with limited motion of the radial side.(Cannot fully flexed the thumb and index fingers). She tells me her energy is low, she doesn't like her hair loss, and she has taste alteration for a few days after each treatment. However she has maintained a good walking program, maintaining her weight, and has normal bowel and bladder function. She denies any unusual headaches, visual changes, or dizziness. There is no left upper extremity discomfort or limitation. A detailed review of systems was otherwise noncontributory  PAST MEDICAL HISTORY: Past Medical History  Diagnosis Date  . 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 Hypothyroid   . Enlarged lymph nodes   . BREAST CANCER 04/2001    s/p radiation therapy, chemotherapy and lumpectomy  . 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    PAST SURGICAL HISTORY: Past Surgical History  Procedure Laterality Date  . Tonsillectomy    . Breast lumpectomy  04/2001    excision of L breast fibroadenoma by Dr.   . Breast lumpectomy  05/16/2001    Right axillary sentinel lymph node biopsy.  . Portacath placement  09/19/2012    Procedure: INSERTION PORT-A-CATH;  Surgeon: [name], MD;  Location: [facility] OR;  Service: General;  Laterality: N/A;    FAMILY HISTORY Family History  Problem Relation Age of Onset  . Diabetes Mother   . Hypertension Father   . Hypertension Sister   . Hypothyroidism Brother   . Cancer Neg Hx   The patient's father died from heart  disease at the age of 75. The patient's mother died 07-14-2012 at the age of 95 also from heart disease. The patient has 2 sisters and 2 brothers. There is no history of breast or ovarian cancer in the family to her knowledge.  GYNECOLOGIC HISTORY: G1 P1, first live birth age 10. She underwent menopause at the time of her chemotherapy in 2002. She did not use hormone replacement.  SOCIAL HISTORY: The patient is divorced and lives by herself. Her son Debbie Larsen lives in [place], where he works as an Publishing copy. The patient has no grandchildren. She attends the local [employer].   ADVANCED DIRECTIVES: The patient tells me (11/01/2012) her healthcare power of attorney is her sister Debbie Larsen. She was unable to give me her contact information   HEALTH MAINTENANCE: History  Substance Use Topics  . Smoking status: Never Smoker   . Smokeless tobacco: Never Used  . Alcohol Use: No     Colonoscopy: never  PAP: 08/17/2012  Bone density: never  Lipid panel: Not on file  No Known Allergies  Current Outpatient Prescriptions  Medication Sig Dispense Refill  . acetaminophen (TYLENOL) 500 MG tablet Take 500 mg by mouth every 4 (four) hours as needed. For pain.      Marland Kitchen aspirin 325 MG tablet Take 325 mg by mouth every other day.      . cholecalciferol 2000 UNITS tablet Take 1 tablet (2,000 Units total) by mouth daily.      Marland Kitchen levothyroxine (SYNTHROID, LEVOTHROID) 75 MCG tablet Take 1 tablet (75 mcg total) by mouth daily. (dosage change)  90 tablet  1  . lidocaine-prilocaine (EMLA) cream Apply topically as needed. Apply to port 1-2 hours before procedure  30 g  0  . loratadine (CLARITIN) 10 MG tablet Take 1 tablet (10 mg total) by mouth daily.  30 tablet  11  . LORazepam (ATIVAN) 0.5 MG tablet Take 1 tablet (0.5 mg total) by mouth 2 (two) times daily as needed for anxiety.  30 tablet  1  . prochlorperazine (COMPAZINE) 10 MG tablet Take 1 tablet (10 mg total) by mouth every 6 (six) hours as  needed.  30 tablet  2  . traMADol (ULTRAM) 50 MG tablet Take 1 tablet (50 mg total) by mouth every 8 (eight) hours as needed for pain.  30 tablet  1   No current facility-administered medications for this visit.    OBJECTIVE: Middle-aged [place] Asian woman who appears anxious Filed Vitals:   02/20/13 1628  BP: 123/77  Pulse: 91  Temp: 98.7 F (37.1 C)  Resp: 20  Body mass index is 23.03 kg/(m^2). ECOG: 1 Filed Vitals:   02/20/13 1628  Height: 5' (1.524 m)  Weight: 117 lb 14.4 oz (53.479 kg)   Sclerae unicteric Oropharynx clear No cervical or supraclavicular adenopathy, no significant limitation of motion in the neck 2 flexion-extension or rotation Lungs no rales or rhonchi Heart regular rate and rhythm Abd soft, nontender, positive bowel sounds MSK no focal spinal tenderness, no peripheral edema; Neuro:  right upper to mid strength is 4+ over 5. She has good grip with the third, fourth and fifth digits of the right hand.. Well  oriented, some pressure of speech, appears surprised at news she has previously received (for example that she has stage IV disease and that it is not curable). Breasts: The right breast is status post lumpectomy and radiation. I do not palpate a well-defined mass and the right axilla is benign to exam. Left breast is unremarkable   LAB RESULTS: Lab Results  Component Value Date   WBC 5.4 02/07/2013   NEUTROABS 2.4 02/07/2013   HGB 11.1* 02/07/2013   HCT 33.6* 02/07/2013   MCV 80.2 02/07/2013   PLT 254 02/07/2013      Chemistry      Component Value Date/Time   NA 139 02/07/2013 1301   NA 138 09/18/2012 1531   K 3.8 02/07/2013 1301   K 3.7 09/18/2012 1531   CL 106 02/07/2013 1301   CL 103 09/18/2012 1531   CO2 23 02/07/2013 1301   CO2 23 09/18/2012 1531   BUN 10.1 02/07/2013 1301   BUN 9 09/18/2012 1531   CREATININE 0.8 02/07/2013 1301   CREATININE 0.70 09/18/2012 1531   CREATININE 0.74 03/01/2012 1715      Component Value Date/Time   CALCIUM 9.3  02/07/2013 1301   CALCIUM 9.8 09/18/2012 1531   ALKPHOS 78 02/07/2013 1301   ALKPHOS 81 09/18/2012 1531   AST 13 02/07/2013 1301   AST 18 09/18/2012 1531   ALT 10 02/07/2013 1301   ALT 13 09/18/2012 1531   BILITOT 0.44 02/07/2013 1301   BILITOT 0.3 09/18/2012 1531       Lab Results  Component Value Date   LABCA2 27 12/27/2012     STUDIES: Staging studies obtained at [facility] 01/18/2013 were reviewed and copies were given to the patient. MRI of the brachial plexus showed nonspecific myositis involving the deltoid, subscapularis, teres minor and infraspinatus. There was no mass demonstrated. Incidental disc bulge at C5-C6 was noted. Whole body bone scan showed focal tracer activity in the left acetabulum, left 12th rib posteriorly, and right sixth rib posteriorly. CT of the chest, abdomen and pelvis shows slight decrease in size of some mediastinal lymph nodes,, persistent unchanged nodularity in the right axilla, normal liver, and a 9 mm sclerotic sacral lesion which was new as compared to November of 2013.   ASSESSMENT: 62 y.o. [place] woman originally from [place] with stage IV breast cancer:  (1) Status post right lumpectomy and sentinel lymph node sampling 05/16/2001 for a pT1c pN1, stage IIA invasive ductal carcinoma, grade 2, estrogen receptor 95% and progesterone receptor 96% positive, with an MIB-1 of 11% and no HER-2 amplification by immunocytochemistry.  (2) Treated adjuvantly with Cyclophosphamide, Methotrexate and Fluorouracil, likely for 8 cycles (data unavailable), completed February 2003.  (3) Completed radiation to the right breast and regional lymph nodes 02/12/2002.  (4) The patient apparently refused completion axillary lymph node dissection and antiestrogen therapy (data unavailable).  (5) Recurrent disease documented by right axillary lymph node biopsy 08/23/2012, showing a high-grade invasive ductal carcinoma which was estrogen and progesterone receptor negative, with an  MIB-1 of 59%, and no HER-2 amplification; staging studies showed hyper metabolic mediastinal and left lung lesions, with no involvement of the liver or brain  (6) Abraxane every 2 weeks started 09/20/2012, with good tolerance and evidence of response, completing 12 doses 02/21/2013.  (7) restaging studies at [facility] March 2014 as follows  (a) bone involvement: bone scan showed focal tracer activity in the left acetabulum, left 12th rib posteriorly, and right sixth rib posteriorly; on CT scan a 9 mm  sclerotic sacral lesion was new as compared to November of 2013.  (b) CT of the chest, abdomen and pelvis showe slight decrease in size of some mediastinal lymph nodes, persistent unchanged nodularity in the right axilla, normal liver, and no new or enlarging pulmonary nodules  (7) genetic testing pending at [facility]  (8) zolendronate started 02/21/2013, to continue monthly  (9) nonspecific myositis RUE documented at [facility] by EMG March 2014; cervical MRI pending   PLAN:  Debbie Larsen had many questions today. I encouraged her to followup on Dr. Toma Aran suggestions, which include starting zoledronic acid, getting her genetics done at [facility], so that, if she does carry a mutation, she can participate in a PARP study available at [facility]. Dr. Amie Critchley also suggested a cervical MRI with referral to neurosurgery depending on results.  Debbie Larsen finds it difficult to get to [facility] and would like to have as much of this done here as possible. She understands she will have to go to [facility] for the genetic testing, and if she does qualify for the PARP study she will have to go to [facility] to participate. The cervical MRI and consideration of neurosurgery can be done here, of course, as can the zoledronic acid.  After much discussion, then, she agreed to start zoledronate tomorrow. She will have an MRI of the cervical spine next week and call me the next day for results. If there is an actionable lesion we will refer her to Dr. Maeola Harman. I am  also referring her to Dr. Maris Berger to evaluate Sue's recurrent bilateral chalazions. Debbie Larsen also wanted referral to a primary care physician, but she did not want to follow my suggestion of of a referral to the [facility] group.  I made her a return appointment mid May. She should have been back to [facility] by then, as well as had the studies discussed above. She knows to call for any problems that may develop before that visit.     Lowella Dell, MD  02/20/2013

## 2013-02-21 ENCOUNTER — Other Ambulatory Visit: Payer: Self-pay | Admitting: Oncology

## 2013-02-21 ENCOUNTER — Ambulatory Visit: Payer: Medicaid Other

## 2013-02-21 ENCOUNTER — Other Ambulatory Visit (HOSPITAL_BASED_OUTPATIENT_CLINIC_OR_DEPARTMENT_OTHER): Payer: Medicaid Other | Admitting: Lab

## 2013-02-21 ENCOUNTER — Other Ambulatory Visit: Payer: Self-pay | Admitting: *Deleted

## 2013-02-21 ENCOUNTER — Ambulatory Visit (HOSPITAL_COMMUNITY)
Admission: RE | Admit: 2013-02-21 | Discharge: 2013-02-21 | Disposition: A | Discharge status: Home / Self Care | Payer: Medicaid Other | Source: Ambulatory Visit | Attending: Oncology | Admitting: Oncology

## 2013-02-21 ENCOUNTER — Ambulatory Visit (HOSPITAL_BASED_OUTPATIENT_CLINIC_OR_DEPARTMENT_OTHER): Payer: Medicaid Other

## 2013-02-21 DIAGNOSIS — C50219 Malignant neoplasm of upper-inner quadrant of unspecified female breast: Secondary | ICD-10-CM

## 2013-02-21 DIAGNOSIS — M8580 Other specified disorders of bone density and structure, unspecified site: Secondary | ICD-10-CM

## 2013-02-21 DIAGNOSIS — C50912 Malignant neoplasm of unspecified site of left female breast: Secondary | ICD-10-CM

## 2013-02-21 DIAGNOSIS — C50919 Malignant neoplasm of unspecified site of unspecified female breast: Secondary | ICD-10-CM

## 2013-02-21 DIAGNOSIS — M5020 Other cervical disc displacement, unspecified cervical region: Secondary | ICD-10-CM | POA: Insufficient documentation

## 2013-02-21 DIAGNOSIS — R209 Unspecified disturbances of skin sensation: Secondary | ICD-10-CM | POA: Insufficient documentation

## 2013-02-21 DIAGNOSIS — Z5111 Encounter for antineoplastic chemotherapy: Secondary | ICD-10-CM

## 2013-02-21 DIAGNOSIS — M542 Cervicalgia: Secondary | ICD-10-CM | POA: Insufficient documentation

## 2013-02-21 DIAGNOSIS — C773 Secondary and unspecified malignant neoplasm of axilla and upper limb lymph nodes: Secondary | ICD-10-CM

## 2013-02-21 DIAGNOSIS — M129 Arthropathy, unspecified: Secondary | ICD-10-CM | POA: Insufficient documentation

## 2013-02-21 DIAGNOSIS — Z79899 Other long term (current) drug therapy: Secondary | ICD-10-CM | POA: Insufficient documentation

## 2013-02-21 DIAGNOSIS — E559 Vitamin D deficiency, unspecified: Secondary | ICD-10-CM

## 2013-02-21 DIAGNOSIS — C7951 Secondary malignant neoplasm of bone: Secondary | ICD-10-CM

## 2013-02-21 DIAGNOSIS — R29898 Other symptoms and signs involving the musculoskeletal system: Secondary | ICD-10-CM | POA: Insufficient documentation

## 2013-02-21 DIAGNOSIS — C7952 Secondary malignant neoplasm of bone marrow: Secondary | ICD-10-CM

## 2013-02-21 LAB — COMPREHENSIVE METABOLIC PANEL (CC13)
Albumin: 3.7 g/dL (ref 3.5–5.0)
Alkaline Phosphatase: 78 U/L (ref 40–150)
BUN: 11.3 mg/dL (ref 7.0–26.0)
CO2: 24 mEq/L (ref 22–29)
Calcium: 9.3 mg/dL (ref 8.4–10.4)
Chloride: 106 mEq/L (ref 98–107)
Glucose: 88 mg/dl (ref 70–99)
Potassium: 4 mEq/L (ref 3.5–5.1)
Sodium: 139 mEq/L (ref 136–145)
Total Protein: 7.3 g/dL (ref 6.4–8.3)

## 2013-02-21 LAB — CBC WITH DIFFERENTIAL/PLATELET
BASO%: 0.7 % (ref 0.0–2.0)
EOS%: 0.4 % (ref 0.0–7.0)
HCT: 33.6 % — ABNORMAL LOW (ref 34.8–46.6)
LYMPH%: 34.4 % (ref 14.0–49.7)
MCH: 26.4 pg (ref 25.1–34.0)
MCHC: 33.2 g/dL (ref 31.5–36.0)
MONO%: 11.6 % (ref 0.0–14.0)
NEUT%: 52.9 % (ref 38.4–76.8)
Platelets: 252 10*3/uL (ref 145–400)
WBC: 5.4 10*3/uL (ref 3.9–10.3)
lymph#: 1.8 10*3/uL (ref 0.9–3.3)

## 2013-02-21 LAB — TSH: TSH: 1.882 u[IU]/mL (ref 0.350–4.500)

## 2013-02-21 LAB — T4: T4, Total: 8.7 ug/dL (ref 5.0–12.5)

## 2013-02-21 LAB — T3: T3, Total: 61.4 ng/dL — ABNORMAL LOW (ref 80.0–204.0)

## 2013-02-21 MED ORDER — SODIUM CHLORIDE 0.9 % IV SOLN
Freq: Once | INTRAVENOUS | Status: AC
  Administered 2013-02-21: 14:00:00 via INTRAVENOUS

## 2013-02-21 MED ORDER — HEPARIN SOD (PORK) LOCK FLUSH 100 UNIT/ML IV SOLN
500.0000 [IU] | Freq: Once | INTRAVENOUS | Status: DC | PRN
  Filled 2013-02-21: qty 5

## 2013-02-21 MED ORDER — GADOBENATE DIMEGLUMINE 529 MG/ML IV SOLN
10.0000 mL | Freq: Once | INTRAVENOUS | Status: AC | PRN
  Administered 2013-02-21: 10 mL via INTRAVENOUS

## 2013-02-21 MED ORDER — SODIUM CHLORIDE 0.9 % IJ SOLN
10.0000 mL | INTRAMUSCULAR | Status: AC | PRN
  Filled 2013-02-21: qty 10

## 2013-02-21 MED ORDER — ONDANSETRON 8 MG/50ML IVPB (CHCC)
8.0000 mg | Freq: Once | INTRAVENOUS | Status: AC
  Administered 2013-02-21: 8 mg via INTRAVENOUS

## 2013-02-21 MED ORDER — ZOLEDRONIC ACID 4 MG/100ML IV SOLN
4.0000 mg | Freq: Once | INTRAVENOUS | Status: AC
  Administered 2013-02-21: 4 mg via INTRAVENOUS
  Filled 2013-02-21: qty 100

## 2013-02-21 MED ORDER — DEXAMETHASONE SODIUM PHOSPHATE 10 MG/ML IJ SOLN
10.0000 mg | Freq: Once | INTRAMUSCULAR | Status: AC
  Administered 2013-02-21: 10 mg via INTRAVENOUS

## 2013-02-21 MED ORDER — SODIUM CHLORIDE 0.9 % IJ SOLN
10.0000 mL | INTRAMUSCULAR | Status: DC | PRN
  Filled 2013-02-21: qty 10

## 2013-02-21 MED ORDER — HEPARIN SOD (PORK) LOCK FLUSH 100 UNIT/ML IV SOLN
500.0000 [IU] | Freq: Once | INTRAVENOUS | Status: AC
  Filled 2013-02-21: qty 5

## 2013-02-21 MED ORDER — PACLITAXEL PROTEIN-BOUND CHEMO INJECTION 100 MG
100.0000 mg/m2 | Freq: Once | INTRAVENOUS | Status: AC
  Administered 2013-02-21: 150 mg via INTRAVENOUS
  Filled 2013-02-21: qty 30

## 2013-02-21 NOTE — Patient Instructions (Addendum)
Zoledronic Acid injection (Hypercalcemia, Oncology) What is this medicine? ZOLEDRONIC ACID (ZOE le dron ik AS id) lowers the amount of calcium loss from bone. It is used to treat too much calcium in your blood from cancer. It is also used to prevent complications of cancer that has spread to the bone. This medicine may be used for other purposes; ask your health care provider or pharmacist if you have questions. What should I tell my health care provider before I take this medicine? They need to know if you have any of these conditions: -aspirin-sensitive asthma -dental disease -kidney disease -an unusual or allergic reaction to zoledronic acid, other medicines, foods, dyes, or preservatives -pregnant or trying to get pregnant -breast-feeding How should I use this medicine? This medicine is for infusion into a vein. It is given by a health care professional in a hospital or clinic setting. Talk to your pediatrician regarding the use of this medicine in children. Special care may be needed. Overdosage: If you think you have taken too much of this medicine contact a poison control center or emergency room at once. NOTE: This medicine is only for you. Do not share this medicine with others. What if I miss a dose? It is important not to miss your dose. Call your doctor or health care professional if you are unable to keep an appointment. What may interact with this medicine? -certain antibiotics given by injection -NSAIDs, medicines for pain and inflammation, like ibuprofen or naproxen -some diuretics like bumetanide, furosemide -teriparatide -thalidomide This list may not describe all possible interactions. Give your health care provider a list of all the medicines, herbs, non-prescription drugs, or dietary supplements you use. Also tell them if you smoke, drink alcohol, or use illegal drugs. Some items may interact with your medicine. What should I watch for while using this medicine? Visit  your doctor or health care professional for regular checkups. It may be some time before you see the benefit from this medicine. Do not stop taking your medicine unless your doctor tells you to. Your doctor may order blood tests or other tests to see how you are doing. Women should inform their doctor if they wish to become pregnant or think they might be pregnant. There is a potential for serious side effects to an unborn child. Talk to your health care professional or pharmacist for more information. You should make sure that you get enough calcium and vitamin D while you are taking this medicine. Discuss the foods you eat and the vitamins you take with your health care professional. Some people who take this medicine have severe bone, joint, and/or muscle pain. This medicine may also increase your risk for a broken thigh bone. Tell your doctor right away if you have pain in your upper leg or groin. Tell your doctor if you have any pain that does not go away or that gets worse. What side effects may I notice from receiving this medicine? Side effects that you should report to your doctor or health care professional as soon as possible: -allergic reactions like skin rash, itching or hives, swelling of the face, lips, or tongue -anxiety, confusion, or depression -breathing problems -changes in vision -feeling faint or lightheaded, falls -jaw burning, cramping, pain -muscle cramps, stiffness, or weakness -trouble passing urine or change in the amount of urine Side effects that usually do not require medical attention (report to your doctor or health care professional if they continue or are bothersome): -bone, joint, or muscle pain -  fever -hair loss -irritation at site where injected -loss of appetite -nausea, vomiting -stomach upset -tired This list may not describe all possible side effects. Call your doctor for medical advice about side effects. You may report side effects to FDA at  1-800-FDA-1088. Where should I keep my medicine? This drug is given in a hospital or clinic and will not be stored at home. NOTE: This sheet is a summary. It may not cover all possible information. If you have questions about this medicine, talk to your doctor, pharmacist, or health care provider.  2013, Elsevier/Gold Standard. (04/15/2011 9:06:58 AM) Nanoparticle Albumin-Bound Paclitaxel injection What is this medicine? NANOPARTICLE ALBUMIN-BOUND PACLITAXEL (Na no PAHR ti kuhl al BYOO muhn-bound PAK li TAX el) is a chemotherapy drug. It targets fast dividing cells, like cancer cells, and causes these cells to die. This medicine is used to treat advanced breast cancer and advanced lung cancer. This medicine may be used for other purposes; ask your health care provider or pharmacist if you have questions. What should I tell my health care provider before I take this medicine? They need to know if you have any of these conditions: -kidney disease -liver disease -low blood counts, like low platelets, red blood cells, or white blood cells -recent or ongoing radiation therapy -an unusual or allergic reaction to paclitaxel, albumin, other chemotherapy, other medicines, foods, dyes, or preservatives -pregnant or trying to get pregnant -breast-feeding How should I use this medicine? This drug is given as an infusion into a vein. It is administered in a hospital or clinic by a specially trained health care professional. Talk to your pediatrician regarding the use of this medicine in children. Special care may be needed. Overdosage: If you think you have taken too much of this medicine contact a poison control center or emergency room at once. NOTE: This medicine is only for you. Do not share this medicine with others. What if I miss a dose? It is important not to miss your dose. Call your doctor or health care professional if you are unable to keep an appointment. What may interact with this  medicine? This medicine may also interact with the following medications: -cyclosporine -dexamethasone -diazepam -ketoconazole -medicines to increase blood counts like filgrastim, pegfilgrastim, sargramostim -other chemotherapy drugs like cisplatin, doxorubicin, epirubicin, etoposide, teniposide, vincristine -quinidine -testosterone -vaccines -verapamil Talk to your doctor or health care professional before taking any of these medicines: -acetaminophen -aspirin -ibuprofen -ketoprofen -naproxen This list may not describe all possible interactions. Give your health care provider a list of all the medicines, herbs, non-prescription drugs, or dietary supplements you use. Also tell them if you smoke, drink alcohol, or use illegal drugs. Some items may interact with your medicine. What should I watch for while using this medicine? Your condition will be monitored carefully while you are receiving this medicine. You will need important blood work done while you are taking this medicine. This drug may make you feel generally unwell. This is not uncommon, as chemotherapy can affect healthy cells as well as cancer cells. Report any side effects. Continue your course of treatment even though you feel ill unless your doctor tells you to stop. In some cases, you may be given additional medicines to help with side effects. Follow all directions for their use. Call your doctor or health care professional for advice if you get a fever, chills or sore throat, or other symptoms of a cold or flu. Do not treat yourself. This drug decreases your body's ability to   fight infections. Try to avoid being around people who are sick. This medicine may increase your risk to bruise or bleed. Call your doctor or health care professional if you notice any unusual bleeding. Be careful brushing and flossing your teeth or using a toothpick because you may get an infection or bleed more easily. If you have any dental work  done, tell your dentist you are receiving this medicine. Avoid taking products that contain aspirin, acetaminophen, ibuprofen, naproxen, or ketoprofen unless instructed by your doctor. These medicines may hide a fever. Do not become pregnant while taking this medicine. Women should inform their doctor if they wish to become pregnant or think they might be pregnant. There is a potential for serious side effects to an unborn child. Talk to your health care professional or pharmacist for more information. Do not breast-feed an infant while taking this medicine. Men are advised not to father a child while receiving this medicine. What side effects may I notice from receiving this medicine? Side effects that you should report to your doctor or health care professional as soon as possible: -allergic reactions like skin rash, itching or hives, swelling of the face, lips, or tongue -low blood counts - This drug may decrease the number of white blood cells, red blood cells and platelets. You may be at increased risk for infections and bleeding. -signs of infection - fever or chills, cough, sore throat, pain or difficulty passing urine -signs of decreased platelets or bleeding - bruising, pinpoint red spots on the skin, black, tarry stools, nosebleeds -signs of decreased red blood cells - unusually weak or tired, fainting spells, lightheadedness -breathing problems -changes in vision -chest pain -high or low blood pressure -mouth sores -nausea and vomiting -pain, swelling, redness or irritation at the injection site -pain, tingling, numbness in the hands or feet -slow or irregular heartbeat -swelling of the ankle, feet, hands Side effects that usually do not require medical attention (report to your doctor or health care professional if they continue or are bothersome): -aches, pains -changes in the color of fingernails -diarrhea -hair loss -loss of appetite This list may not describe all possible  side effects. Call your doctor for medical advice about side effects. You may report side effects to FDA at 1-800-FDA-1088. Where should I keep my medicine? This drug is given in a hospital or clinic and will not be stored at home. NOTE: This sheet is a summary. It may not cover all possible information. If you have questions about this medicine, talk to your doctor, pharmacist, or health care provider.  2013, Elsevier/Gold Standard. (08/22/2011 4:13:49 PM)  

## 2013-02-22 ENCOUNTER — Telehealth: Payer: Self-pay | Admitting: *Deleted

## 2013-02-22 NOTE — Telephone Encounter (Signed)
This RN returned call to pt stating " I have a fever ". Per inquiry pt's temp is 99.5. [name] stated concern due to " I have been getting chemo for 6 months and I usually don't get a fever after "  Discussed above may be secondary to zometa infusion yesterday which was a new drug for her. Discussed use of increased fluids and moderate use of tylenol. This RN also answered questions per pt's inquiry of lab values from yesterday as well as questions regarding " what can I eat ". Pt also inquired about MRI results which this RN informed her with pt requesting copy to be sent to her neurosurgeon Dr Venetia Maxon.  Per end of conversation all questions answered with no additional needs at this time.

## 2013-02-22 NOTE — Telephone Encounter (Signed)
sw pt gv appt for Dr. Jamey Ripa, Dr. Charlotte Sanes, and [facility]. Pt stated she had an MRI completed yesterday.pt is aware all her appts d/t....td

## 2013-02-25 ENCOUNTER — Other Ambulatory Visit: Payer: Self-pay | Admitting: Oncology

## 2013-02-25 DIAGNOSIS — C50919 Malignant neoplasm of unspecified site of unspecified female breast: Secondary | ICD-10-CM

## 2013-02-26 ENCOUNTER — Encounter (INDEPENDENT_AMBULATORY_CARE_PROVIDER_SITE_OTHER): Payer: Medicaid Other | Admitting: Surgery

## 2013-02-26 ENCOUNTER — Other Ambulatory Visit: Payer: Self-pay | Admitting: *Deleted

## 2013-02-26 ENCOUNTER — Other Ambulatory Visit: Payer: Self-pay | Admitting: Oncology

## 2013-02-26 MED ORDER — LEVOTHYROXINE SODIUM 100 MCG PO TABS: 100.0000 ug | ORAL_TABLET | Freq: Every day | ORAL | Status: DC

## 2013-02-27 ENCOUNTER — Ambulatory Visit (HOSPITAL_COMMUNITY): Payer: Medicaid Other

## 2013-03-06 ENCOUNTER — Telehealth: Payer: Self-pay | Admitting: *Deleted

## 2013-03-06 NOTE — Telephone Encounter (Signed)
Pt called to get Val's number and I informed her that Val was not here today and then she proceeded to ask tons of questions about Dr. Donnie Coffin and Tressie Ellis.  Some I could answer, others I could not.  She was in question about her scans being scheduled and that Val was going to take care of that.  I looked and did not see an order, so I told her that I would have to get w/ Val tomorrow.  She then said that she needs to see the doctor cause she is not feeling well and is hurting.  I asked her what her symptoms were and she told me - not feeling well, dizzy, faint, and she feels that it is from her meds.  I told her that I would give the message to Val, however, if she got worse to please go to the ER.  She agreed.

## 2013-03-07 ENCOUNTER — Other Ambulatory Visit: Payer: Self-pay | Admitting: *Deleted

## 2013-03-07 ENCOUNTER — Telehealth: Payer: Self-pay | Admitting: *Deleted

## 2013-03-07 NOTE — Telephone Encounter (Signed)
This RN spoke with pt per her calls regarding obtaining a PET scan for restaging. Debbie Larsen states she has noticed new pain in her pelvis area. Per further questioning she states " bones" vs soft abdominal area. Pain noted more with walking " and when I am cooking and then at night when I am sleeping ".  This RN reviewed above with MD. Obtained recommendation for PET scan.  Discussed with pt - note pt also inquired if reports from [facility] have been scanned into chart. Noted myelogram available but not records per visit with Dr Dossie Der. ( noted as received by RN and MD ). This RN left message with medical records per VM for allocation of above records.  Order for PET scan entered per MD request.

## 2013-03-13 ENCOUNTER — Encounter (HOSPITAL_COMMUNITY)
Admission: RE | Admit: 2013-03-13 | Discharge: 2013-03-13 | Disposition: A | Discharge status: Home / Self Care | Payer: Medicaid Other | Source: Ambulatory Visit | Attending: Oncology | Admitting: Oncology

## 2013-03-13 ENCOUNTER — Encounter (HOSPITAL_COMMUNITY): Payer: Self-pay

## 2013-03-13 DIAGNOSIS — C50919 Malignant neoplasm of unspecified site of unspecified female breast: Secondary | ICD-10-CM | POA: Insufficient documentation

## 2013-03-13 DIAGNOSIS — C7951 Secondary malignant neoplasm of bone: Secondary | ICD-10-CM

## 2013-03-13 DIAGNOSIS — C7800 Secondary malignant neoplasm of unspecified lung: Secondary | ICD-10-CM | POA: Insufficient documentation

## 2013-03-13 HISTORY — DX: Secondary malignant neoplasm of bone: C79.51

## 2013-03-13 LAB — GLUCOSE, CAPILLARY: Glucose-Capillary: 114 mg/dL — ABNORMAL HIGH (ref 70–99)

## 2013-03-13 MED ORDER — FLUDEOXYGLUCOSE F - 18 (FDG) INJECTION
19.4000 | Freq: Once | INTRAVENOUS | Status: AC | PRN
  Administered 2013-03-13: 19.4 via INTRAVENOUS

## 2013-03-14 DIAGNOSIS — Z79811 Long term (current) use of aromatase inhibitors: Secondary | ICD-10-CM

## 2013-03-14 HISTORY — DX: Long term (current) use of aromatase inhibitors: Z79.811

## 2013-03-15 ENCOUNTER — Telehealth: Payer: Self-pay | Admitting: *Deleted

## 2013-03-15 ENCOUNTER — Ambulatory Visit (HOSPITAL_BASED_OUTPATIENT_CLINIC_OR_DEPARTMENT_OTHER): Payer: Medicaid Other | Admitting: Oncology

## 2013-03-15 ENCOUNTER — Encounter: Payer: Self-pay | Admitting: Oncology

## 2013-03-15 ENCOUNTER — Other Ambulatory Visit: Payer: Self-pay | Admitting: *Deleted

## 2013-03-15 ENCOUNTER — Other Ambulatory Visit: Payer: Medicaid Other | Admitting: Lab

## 2013-03-15 VITALS — BP 115/70 | HR 82 | Temp 98.5°F | Resp 20 | Ht 60.0 in | Wt 118.5 lb

## 2013-03-15 DIAGNOSIS — C50919 Malignant neoplasm of unspecified site of unspecified female breast: Secondary | ICD-10-CM

## 2013-03-15 DIAGNOSIS — F329 Major depressive disorder, single episode, unspecified: Secondary | ICD-10-CM

## 2013-03-15 MED ORDER — LETROZOLE 2.5 MG PO TABS
2.5000 mg | ORAL_TABLET | Freq: Every day | ORAL | Status: DC
Start: 2013-03-15 — End: 2013-05-09

## 2013-03-15 NOTE — Telephone Encounter (Signed)
I tried to call and  inform the pt that GCM placed another pof stating that he wanted the pt to see Dr. Venetia Maxon as well as the other dr from the first call. sw Thayer Ohm the coordinator at Dr. Venetia Maxon office she's faxing a referral sheet once its completed and returned they will get the pt scheduled...td

## 2013-03-15 NOTE — Progress Notes (Signed)
Pt is approved for 100% financial assistance effective 03/15/13 - 09/15/13.  I will give pt her approval letter and green card on her next visit on 03/21/13.

## 2013-03-15 NOTE — Telephone Encounter (Signed)
i tried to call the Debbie Larsen and inform them about Dr. Teressa Senter office, but no one answered. Dr. Teressa Senter office request the Debbie Larsen progress notes i gv the form to Selena Batten to fax info. After they review the notes they will call the Debbie Larsen to reschedule them an appt for rehab of the right hand. I will try to call the Debbie Larsen back once MW schedule their tx so that the knowledge of their appt d/t for 5/22 will be given...td

## 2013-03-15 NOTE — Progress Notes (Signed)
ID: [name]   DOB: 14-May-1951  MR#: [id number]  WUJ#:[id number]  PCP: [name], MD SU: [name], MD OTHER MD: Nicholes Mango, [name], [name]   HISTORY OF PRESENT ILLNESS: The patient's 2002-2004 records have been retrieved. The sugar the patient underwent a right breast core biopsy 03/29/2001 for an invasive ductal carcinoma associated with necrosis ([id number]). 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 The tumor was estrogen receptor 95% positive, progesterone receptor 96% positive, with an MIB-1 of 11%, and no HER-2 amplification by the HercepTest. She completed 6 cycles of CMF chemotherapy on 12/12/2001. There were minor delays, but no dose reductions. She received radiation to the right breast and regional lymph nodes to a total of 5040 cGy with a right breast boost of 12 [name], completed 02/12/2002  More recently, "Debbie Larsen" develop some right shoulder pain sometime in late 2012. She had followup mammograms of until 2011. There was no mammogram performed in 2012. She was seen by physical therapy in orthopedic surgery for evaluation of her right shoulder pain. She had a mammogram on 07/24/2012 which showed a suspicious mass behind eczema of the right breast. Physical exam the time did show multiple involved right axillary lymph nodes. 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 HER-2 nonamplified the ratio by CISH being 1.43. Proliferative index was  50%.   MRI scan of both breasts performed on 08/31/2012 showed multiple involved lymph nodes in the right axilla, no obvious masses in either breast. Unfortunately staging PET scan 09/12/2012 showed in addition to her right axillary recurrence, bilateral mediastinal and left lung  nodules consistent with stage IV disease. Her subsequent history is as detailed below.  INTERVAL HISTORY: Debbie Larsen returns today for followup of her metastatic breast cancer. 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 She has a dry cough at times. She has on and off crampy aches, not always in the same places, some loose BMs, and feels anxious though not depressed. A detailed review of systems was otherwise stable.  PAST MEDICAL HISTORY: Past Medical History  Diagnosis Date  . 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 Hypothyroid   . Enlarged lymph nodes   . BREAST CANCER 04/2001    s/p radiation therapy, chemotherapy and lumpectomy  . 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    PAST SURGICAL HISTORY: Past Surgical History  Procedure Laterality Date  . Tonsillectomy    . Breast lumpectomy  04/2001    excision of L breast fibroadenoma by Dr.   . Breast lumpectomy  05/16/2001    Right axillary sentinel lymph node biopsy.  . Portacath placement  09/19/2012    Procedure: INSERTION PORT-A-CATH;  Surgeon: [name], MD;  Location: [facility] OR;  Service: General;  Laterality: N/A;    FAMILY HISTORY Family History  Problem Relation Age of  Onset  . Diabetes Mother   . Hypertension Father   . Hypertension Sister   . Hypothyroidism Brother   . Cancer Neg Hx   The patient's father died from heart disease at the age of 55. The patient's mother died 08-01-2012 at the age of 59 also from heart disease. The patient has 2 sisters and 2 brothers. There is no history of breast or ovarian  cancer in the family to her knowledge.  GYNECOLOGIC HISTORY: G1 P1, first live birth age 63. She underwent menopause at the time of her chemotherapy in 2002. She did not use hormone replacement.  SOCIAL HISTORY: The patient is divorced and lives by herself. Her son Aliz Meritt lives in [place], where he works as an Publishing copy. The patient has no grandchildren. She attends the local [employer].   ADVANCED DIRECTIVES: The patient tells me (11/01/2012) her healthcare power of attorney is her sister Waynetta Metheny. She was unable to give me her contact information   HEALTH MAINTENANCE: History  Substance Use Topics  . Smoking status: Never Smoker   . Smokeless tobacco: Never Used  . Alcohol Use: No     Colonoscopy: never  PAP: 08/17/2012  Bone density: never  Lipid panel: Not on file  No Known Allergies  Current Outpatient Prescriptions  Medication Sig Dispense Refill  . acetaminophen (TYLENOL) 500 MG tablet Take 500 mg by mouth every 4 (four) hours as needed. For pain.      Marland Kitchen aspirin 325 MG tablet Take 325 mg by mouth every other day.      . cholecalciferol 2000 UNITS tablet Take 1 tablet (2,000 Units total) by mouth daily.      Marland Kitchen letrozole (FEMARA) 2.5 MG tablet Take 1 tablet (2.5 mg total) by mouth daily.  30 tablet  12  . levothyroxine (SYNTHROID, LEVOTHROID) 100 MCG tablet Take 1 tablet (100 mcg total) by mouth daily before breakfast.  30 tablet  6  . lidocaine-prilocaine (EMLA) cream Apply topically as needed. Apply to port 1-2 hours before procedure  30 g  0  . loratadine (CLARITIN) 10 MG tablet Take 1 tablet (10 mg total) by mouth daily.  30 tablet  11  . LORazepam (ATIVAN) 0.5 MG tablet Take 1 tablet (0.5 mg total) by mouth 2 (two) times daily as needed for anxiety.  30 tablet  1  . prochlorperazine (COMPAZINE) 10 MG tablet Take 1 tablet (10 mg total) by mouth every 6 (six) hours as needed.  30 tablet  2  . traMADol (ULTRAM) 50 MG tablet Take 1 tablet (50 mg total) by mouth  every 8 (eight) hours as needed for pain.  30 tablet  1   No current facility-administered medications for this visit.   Facility-Administered Medications Ordered in Other Visits  Medication Dose Route Frequency Provider Last Rate Last Dose  . heparin lock flush 100 unit/mL  500 Units Intravenous Once Lowella Dell, MD      . sodium chloride 0.9 % injection 10 mL  10 mL Intravenous PRN Lowella Dell, MD        OBJECTIVE: Middle-aged [place] Asian woman who appears anxious Filed Vitals:   03/15/13 1056  BP: 115/70  Pulse: 82  Temp: 98.5 F (36.9 C)  Resp: 20  Body mass index is 23.14 kg/(m^2). ECOG: 1 Filed Vitals:   03/15/13 1056  Height: 5' (1.524 m)  Weight: 118 lb 8 oz (53.751 kg)   Sclerae unicteric Oropharynx clear No cervical or supraclavicular  adenopathy, fair ROM neck flexion-extension or rotation Lungs no rales or rhonchi, good excursion bilaterally Heart regular rate and rhythm Abd soft, nontender, positive bowel sounds MSK no focal spinal tenderness, no peripheral edema; Neuro:  right upper to mid strength is 4+ over 5. She has good grip with the third, fourth and fifth digits of the right hand.. Well oriented, some pressure of speech, continues to express surprise at news she has previously received (for example that she has stage IV disease and that it is not curable). Breasts: The right breast is status post lumpectomy and radiation. I do not palpate a well-defined mass and the right axilla is benign to exam. Left breast is unremarkable   LAB RESULTS: Lab Results  Component Value Date   WBC 5.4 02/21/2013   NEUTROABS 2.8 02/21/2013   HGB 11.1* 02/21/2013   HCT 33.6* 02/21/2013   MCV 79.5 02/21/2013   PLT 252 02/21/2013      Chemistry      Component Value Date/Time   NA 139 02/21/2013 1309   NA 138 09/18/2012 1531   K 4.0 02/21/2013 1309   K 3.7 09/18/2012 1531   CL 106 02/21/2013 1309   CL 103 09/18/2012 1531   CO2 24 02/21/2013 1309   CO2 23  09/18/2012 1531   BUN 11.3 02/21/2013 1309   BUN 9 09/18/2012 1531   CREATININE 0.8 02/21/2013 1309   CREATININE 0.70 09/18/2012 1531   CREATININE 0.74 03/01/2012 1715      Component Value Date/Time   CALCIUM 9.3 02/21/2013 1309   CALCIUM 9.8 09/18/2012 1531   ALKPHOS 78 02/21/2013 1309   ALKPHOS 81 09/18/2012 1531   AST 13 02/21/2013 1309   AST 18 09/18/2012 1531   ALT 8 02/21/2013 1309   ALT 13 09/18/2012 1531   BILITOT 0.34 02/21/2013 1309   BILITOT 0.3 09/18/2012 1531       Lab Results  Component Value Date   LABCA2 27 12/27/2012     STUDIES: Staging studies obtained at [facility] 01/18/2013 were reviewed and copies were given to the patient.The cervical MRI and PET scans were also discussed and copies provided to [facility]  ASSESSMENT: 62 y.o. [place] woman originally from [place] with stage IV breast cancer:  (1) Status post right lumpectomy and sentinel lymph node sampling 05/16/2001 for a pT1c pN1, stage IIA invasive ductal carcinoma, grade 2, estrogen receptor 95% and progesterone receptor 96% positive, with an MIB-1 of 11% and no HER-2 amplification by immunocytochemistry.  (2) Treated adjuvantly with cyclophosphamide, methotrexate and fluorouracil, likely for 8 cycles (data unavailable), completed February 2003.  (3) Completed radiation to the right breast and regional lymph nodes 02/12/2002.  (4) The patient apparently refused completion axillary lymph node dissection and antiestrogen therapy (data unavailable).  (5) Recurrent disease documented by right axillary lymph node biopsy 08/23/2012, showing a high-grade invasive ductal carcinoma which was estrogen and progesterone receptor negative, with an MIB-1 of 59%, and no HER-2 amplification; staging studies showed hyper metabolic mediastinal and left lung lesions, with no involvement of the liver or brain  (6) Abraxane every 2 weeks started 09/20/2012, with good tolerance and evidence of response, completing 12 doses  02/21/2013.  (7) restaging studies at [facility] March 2014 and here April-May 2014 as follows  (a) bone involvement: [facility] bone scan showed focal tracer activity in the left acetabulum, left 12th rib posteriorly, and right sixth rib posteriorly; on CT scan a 9 mm sclerotic sacral lesion was new as compared to November of 2013.  (  b) [facility] CT of the chest, abdomen and pelvis showed slight decrease in size of some mediastinal lymph nodes, persistent unchanged nodularity in the right axilla, normal liver, and no new or enlarging pulmonary nodules  (c) MRI of the cervical spine shows no obvious lesion to explain the patient's Right arm symptoms  (d) PET scan shows multiple bone lesions (including C7), mild growth in index lymph nodes, no change in possible left lung nodules, and no liver involvement  (7) genetic testing scheduled at [facility] 03/19/2013  (8) zolendronate started 02/21/2013, to continue monthly  (9) nonspecific myositis RUE documented at [facility] by [facility] March 2014; cervical MRI pending  (10) letrozole started 03/14/2013   PLAN:  We had an hour-long discussion of Sue's situation. I think though her more recent biopsy was triple negative, a trial of anti-estrogens may be worthwhile since her original tumor was strongly positive, cancers are heterogeneous, and currently she has primarily bone and nodal involvement, giving [place] a window to try letrozole. The alternative of course is lifelong chemotherapy. We will continue the zolendronate monthly and will see her monthly as well. She will have a chest X ray in June and a repeat PET in July. If the antiestrogens fail to control the tumors we will return to chemotherapy, perhaps again Abraxane. In the meantime she will have genetic testing at [facility] and if positive may qualify for studies at [facility].  She will see Dr Venetia Maxon regarding her R plexopathy (cervical MRI does not suggest a clear cause but PET shows a C7 lesion, which may be true but unrelated). She is also  very distressed that she does not have full use of the Right hand. We are asking Dr Teressa Senter to evaluate for possible rehabbin.  Debbie Larsen is a lovely patient but exceedingly anxious. I am never sure she actually hears what we discuss (she keeps being surprised at hearing that her cancer is not curable). I give her all the information in writing and this at least may allow her to review things in a less stressful environment. I am also concerned that she does not seem to be discussing her situation with her son (she says he "couldn't handle it.")     Lowella Dell, MD  03/15/2013

## 2013-03-18 ENCOUNTER — Telehealth: Payer: Self-pay | Admitting: *Deleted

## 2013-03-18 NOTE — Telephone Encounter (Signed)
Per staff message and POF I have scheduled appts.  JMW  

## 2013-03-19 ENCOUNTER — Telehealth: Payer: Self-pay | Admitting: *Deleted

## 2013-03-19 ENCOUNTER — Other Ambulatory Visit: Payer: Self-pay | Admitting: *Deleted

## 2013-03-19 NOTE — Telephone Encounter (Signed)
i called Dr. Venetia Maxon and Syper office this am to checked to see if the pt has been scheduled yet. At Dr.Syper office Tammi requested for me to send office notes. At Dr. Venetia Maxon office i left another message on Welcome vm.

## 2013-03-19 NOTE — Progress Notes (Signed)
This RN spoke with pt per her call regarding concerns " that I am not doing maintenance chemo ",.  This RN inquired if she has started the letrozole which Debbie Larsen stated " no because I thought I should wait until we do the maintenance chemo "  Per discussion this RN reviewed recommendation to use letrozole first for evaluation of response before resuming chemo-  Questions answered regarding her diagnosis, previous treatment, need for another biopsy as well as possible surgery to remove lymph nodes that have cancer in them.  This RN validated pt's verbalized concern of " I am scared that this cancer is going to spread to my brain "- with Debbie Larsen stating " several women in my support group have had the cancer go to their brain ". Per discussion pt's diagnosis discussed with recommendation for pt to be careful related to " other people stories" again giving pt verbal support for her known diagnosis and history.  Debbie Larsen inquired about targeted therapies which may be of benefit for her " like one you get once a month that I read in a magazine "- this RN informed pt she is not Her 2 + therefore monthly herceptin would not benefit her. Debbie Larsen states she had genetic testing at [facility] " but they said the results would take weeks - should I call [facility] and see if they have any therapies that would help me "  This RN again validated pt's concern for wanting to be under more aggressive therapy due to her verbalized fears- and again reassured to proceed with current recommendation and maintain appointments so we can monitor her and initiate other therapy as appropriate. Informed pt she can call [facility] since she has been seen there before to inquire if they have any studies she would be interested in.  Plan per conversation at present is Debbie Larsen will start the letrozole,and continue zometa as well as  a referral for a second opinion with Dr Welton Flakes will be placed with this RN again reiterating appointment may not be this week as she is  hoping. Note Debbie Larsen stated " but I only need 10-15 minutes of her time- this RN informed her MD would need at least an hour for appropriate discussion for second opinion.  At end of conversation pt stated " I feel better talking with you ".  MD made aware of call and pt's concern.  At end of phone call Debbie Larsen stated she will start the letrozole.

## 2013-03-19 NOTE — Telephone Encounter (Signed)
Faxed pt medical records to Dr Teressa Senter

## 2013-03-19 NOTE — Telephone Encounter (Signed)
i tried to call the pt and inform her about her appts scheduled for 03/21/13, in which is a lab @ 12:45pm and tx @ 1:15pm...td

## 2013-03-20 ENCOUNTER — Telehealth: Payer: Self-pay | Admitting: Oncology

## 2013-03-20 ENCOUNTER — Telehealth: Payer: Self-pay | Admitting: *Deleted

## 2013-03-20 NOTE — Telephone Encounter (Signed)
sw pt informed her that she has an appt for 03/21/13 for lab @ 12:45pm and tx @ 1:15pm. Pt is aware...td

## 2013-03-21 ENCOUNTER — Ambulatory Visit (HOSPITAL_BASED_OUTPATIENT_CLINIC_OR_DEPARTMENT_OTHER): Payer: Medicaid Other

## 2013-03-21 ENCOUNTER — Other Ambulatory Visit (HOSPITAL_BASED_OUTPATIENT_CLINIC_OR_DEPARTMENT_OTHER): Payer: Medicaid Other

## 2013-03-21 ENCOUNTER — Other Ambulatory Visit: Payer: Medicaid Other | Admitting: Lab

## 2013-03-21 ENCOUNTER — Other Ambulatory Visit: Payer: Self-pay | Admitting: *Deleted

## 2013-03-21 VITALS — BP 110/68 | HR 86 | Temp 97.5°F

## 2013-03-21 DIAGNOSIS — C7951 Secondary malignant neoplasm of bone: Secondary | ICD-10-CM

## 2013-03-21 DIAGNOSIS — C50219 Malignant neoplasm of upper-inner quadrant of unspecified female breast: Secondary | ICD-10-CM

## 2013-03-21 DIAGNOSIS — D649 Anemia, unspecified: Secondary | ICD-10-CM

## 2013-03-21 DIAGNOSIS — M8580 Other specified disorders of bone density and structure, unspecified site: Secondary | ICD-10-CM

## 2013-03-21 DIAGNOSIS — C50919 Malignant neoplasm of unspecified site of unspecified female breast: Secondary | ICD-10-CM

## 2013-03-21 DIAGNOSIS — E559 Vitamin D deficiency, unspecified: Secondary | ICD-10-CM

## 2013-03-21 LAB — CBC WITH DIFFERENTIAL/PLATELET
BASO%: 0.6 % (ref 0.0–2.0)
EOS%: 0.4 % (ref 0.0–7.0)
HGB: 10.7 g/dL — ABNORMAL LOW (ref 11.6–15.9)
MCH: 26.8 pg (ref 25.1–34.0)
MCHC: 33.5 g/dL (ref 31.5–36.0)
RBC: 3.99 10*6/uL (ref 3.70–5.45)
RDW: 17.3 % — ABNORMAL HIGH (ref 11.2–14.5)
lymph#: 1.8 10*3/uL (ref 0.9–3.3)

## 2013-03-21 LAB — COMPREHENSIVE METABOLIC PANEL (CC13)
ALT: 13 U/L (ref 0–55)
AST: 14 U/L (ref 5–34)
Albumin: 3.6 g/dL (ref 3.5–5.0)
Calcium: 8.7 mg/dL (ref 8.4–10.4)
Chloride: 106 mEq/L (ref 98–107)
Potassium: 4.1 mEq/L (ref 3.5–5.1)
Sodium: 138 mEq/L (ref 136–145)

## 2013-03-21 LAB — FERRITIN: Ferritin: 68 ng/mL (ref 10–291)

## 2013-03-21 LAB — RETICULOCYTES
Immature Retic Fract: 2.8 % (ref 1.60–10.00)
RBC: 4.03 10*6/uL (ref 3.70–5.45)
Retic %: 1.31 % (ref 0.70–2.10)

## 2013-03-21 MED ORDER — ZOLEDRONIC ACID 4 MG/100ML IV SOLN
4.0000 mg | Freq: Once | INTRAVENOUS | Status: AC
  Administered 2013-03-21: 4 mg via INTRAVENOUS
  Filled 2013-03-21: qty 100

## 2013-03-21 NOTE — Patient Instructions (Signed)
Zoledronic Acid injection (Hypercalcemia, Oncology) What is this medicine? ZOLEDRONIC ACID (ZOE le dron ik AS id) lowers the amount of calcium loss from bone. It is used to treat too much calcium in your blood from cancer. It is also used to prevent complications of cancer that has spread to the bone. This medicine may be used for other purposes; ask your health care provider or pharmacist if you have questions. What should I tell my health care provider before I take this medicine? They need to know if you have any of these conditions: -aspirin-sensitive asthma -dental disease -kidney disease -an unusual or allergic reaction to zoledronic acid, other medicines, foods, dyes, or preservatives -pregnant or trying to get pregnant -breast-feeding How should I use this medicine? This medicine is for infusion into a vein. It is given by a health care professional in a hospital or clinic setting. Talk to your pediatrician regarding the use of this medicine in children. Special care may be needed. Overdosage: If you think you have taken too much of this medicine contact a poison control center or emergency room at once. NOTE: This medicine is only for you. Do not share this medicine with others. What if I miss a dose? It is important not to miss your dose. Call your doctor or health care professional if you are unable to keep an appointment. What may interact with this medicine? -certain antibiotics given by injection -NSAIDs, medicines for pain and inflammation, like ibuprofen or naproxen -some diuretics like bumetanide, furosemide -teriparatide -thalidomide This list may not describe all possible interactions. Give your health care provider a list of all the medicines, herbs, non-prescription drugs, or dietary supplements you use. Also tell them if you smoke, drink alcohol, or use illegal drugs. Some items may interact with your medicine. What should I watch for while using this medicine? Visit  your doctor or health care professional for regular checkups. It may be some time before you see the benefit from this medicine. Do not stop taking your medicine unless your doctor tells you to. Your doctor may order blood tests or other tests to see how you are doing. Women should inform their doctor if they wish to become pregnant or think they might be pregnant. There is a potential for serious side effects to an unborn child. Talk to your health care professional or pharmacist for more information. You should make sure that you get enough calcium and vitamin D while you are taking this medicine. Discuss the foods you eat and the vitamins you take with your health care professional. Some people who take this medicine have severe bone, joint, and/or muscle pain. This medicine may also increase your risk for a broken thigh bone. Tell your doctor right away if you have pain in your upper leg or groin. Tell your doctor if you have any pain that does not go away or that gets worse. What side effects may I notice from receiving this medicine? Side effects that you should report to your doctor or health care professional as soon as possible: -allergic reactions like skin rash, itching or hives, swelling of the face, lips, or tongue -anxiety, confusion, or depression -breathing problems -changes in vision -feeling faint or lightheaded, falls -jaw burning, cramping, pain -muscle cramps, stiffness, or weakness -trouble passing urine or change in the amount of urine Side effects that usually do not require medical attention (report to your doctor or health care professional if they continue or are bothersome): -bone, joint, or muscle pain -  fever -hair loss -irritation at site where injected -loss of appetite -nausea, vomiting -stomach upset -tired This list may not describe all possible side effects. Call your doctor for medical advice about side effects. You may report side effects to FDA at  1-800-FDA-1088. Where should I keep my medicine? This drug is given in a hospital or clinic and will not be stored at home. NOTE: This sheet is a summary. It may not cover all possible information. If you have questions about this medicine, talk to your doctor, pharmacist, or health care provider.  2012, Elsevier/Gold Standard. (04/15/2011 9:06:58 AM) 

## 2013-03-22 ENCOUNTER — Telehealth: Payer: Self-pay | Admitting: *Deleted

## 2013-03-22 ENCOUNTER — Other Ambulatory Visit: Payer: Self-pay | Admitting: *Deleted

## 2013-03-22 ENCOUNTER — Telehealth: Payer: Self-pay | Admitting: Oncology

## 2013-03-22 NOTE — Telephone Encounter (Signed)
Debbie Larsen from Dr.Syphers called stating that he would like to see the pt after she attends her appt on 04/17/13 w/ Dr. Venetia Maxon. She requested for me to have the pt to call her and schedule the appt. sw pt gv her Debbie Larsen number to call and schedule her appt....td

## 2013-03-22 NOTE — Telephone Encounter (Signed)
Rec'ds faxed to Truett Perna [phone] @ Dr. Rush Farmer office.

## 2013-03-26 ENCOUNTER — Telehealth: Payer: Self-pay | Admitting: *Deleted

## 2013-03-26 NOTE — Telephone Encounter (Signed)
sw pt gv appt for 04/17/13 @2 :45pm due to pt has an ov during the am. However pt still wants a different time frame. i connected her with MW to talk about her tx time...td

## 2013-03-26 NOTE — Telephone Encounter (Signed)
Per patient request and POF I have rescheduled appts for 6/18. JMW

## 2013-04-02 ENCOUNTER — Other Ambulatory Visit (HOSPITAL_COMMUNITY): Payer: Self-pay | Admitting: Oncology

## 2013-04-02 DIAGNOSIS — C50911 Malignant neoplasm of unspecified site of right female breast: Secondary | ICD-10-CM

## 2013-04-11 ENCOUNTER — Telehealth: Payer: Self-pay | Admitting: *Deleted

## 2013-04-11 NOTE — Telephone Encounter (Signed)
Debbie Larsen from Dr. Budd Palmer office called to inform me that they are calling the pt this am to schedule an appt w/ them...td

## 2013-04-12 ENCOUNTER — Other Ambulatory Visit: Payer: Self-pay | Admitting: *Deleted

## 2013-04-15 ENCOUNTER — Other Ambulatory Visit: Payer: Self-pay | Admitting: *Deleted

## 2013-04-17 ENCOUNTER — Other Ambulatory Visit: Payer: Medicaid Other | Admitting: Lab

## 2013-04-17 ENCOUNTER — Ambulatory Visit: Payer: Medicaid Other | Admitting: Physician Assistant

## 2013-04-17 ENCOUNTER — Ambulatory Visit: Payer: Medicaid Other

## 2013-04-17 ENCOUNTER — Telehealth: Payer: Self-pay

## 2013-04-17 NOTE — Telephone Encounter (Signed)
Informed by Melodie Bouillon, infusion RN, that pt had not shown for lab and infusion yet.  She tried to call pt's home number listed with no answer and no vm.  This RN attempted to call pt's home with no answer, also spoke with pt's sister listed in emergency contacts- Oceane Fosse, and she states she had tried to call pt earlier with no answer.  She states she will try to get in touch with pt and call our office or have her call our office.  Called and left messages for Atilano Median on identified vm asking to call office back or have pt call office.  Zollie Scale, PA made aware.

## 2013-04-18 ENCOUNTER — Other Ambulatory Visit: Payer: Self-pay | Admitting: Oncology

## 2013-04-18 ENCOUNTER — Other Ambulatory Visit (HOSPITAL_COMMUNITY): Payer: Medicaid Other

## 2013-04-18 ENCOUNTER — Other Ambulatory Visit (HOSPITAL_COMMUNITY): Payer: Self-pay | Admitting: Oncology

## 2013-04-18 ENCOUNTER — Other Ambulatory Visit: Payer: Medicaid Other | Admitting: Lab

## 2013-04-18 ENCOUNTER — Other Ambulatory Visit: Payer: Self-pay | Admitting: *Deleted

## 2013-04-18 ENCOUNTER — Ambulatory Visit
Admission: RE | Admit: 2013-04-18 | Discharge: 2013-04-18 | Disposition: A | Discharge status: Home / Self Care | Payer: Medicaid Other | Source: Ambulatory Visit | Attending: Oncology | Admitting: Oncology

## 2013-04-18 DIAGNOSIS — C50911 Malignant neoplasm of unspecified site of right female breast: Secondary | ICD-10-CM

## 2013-04-18 NOTE — Progress Notes (Signed)
LATE ADDENDUM-  This RN spoke with pt late Tuesday evening post review with MD per pt's questions regarding need to proceed with IV chemo.  Note pt repeated questions in different inquiries - per discussion this RN informed pt need to ask MD questions and write the answers down so she can refer back to them.   Plan per conversation plan is for pt to obtain mammogram as scheduled - she will see MD on 6/20 at 1130.  This RN did not send POF per above appropriately for rescheduling of zometa and AB appointment on 6/18.

## 2013-04-19 ENCOUNTER — Telehealth: Payer: Self-pay | Admitting: *Deleted

## 2013-04-19 ENCOUNTER — Other Ambulatory Visit: Payer: Medicaid Other | Admitting: Lab

## 2013-04-19 ENCOUNTER — Ambulatory Visit: Payer: Medicaid Other | Admitting: Oncology

## 2013-04-19 NOTE — Telephone Encounter (Signed)
Messages left by patient stating she is unable to come in for appointment today due to not feeling well- and is requesting a return call.  This RN spoke with pt who states she felt dizzy this am post some diarrhea yesterday.  Per call [name] stated understanding that she needs to see Dr Dayton Scrape for radiation to her cervical spine. This RN informed pt of time and date of appointment. She states she also had mammogram yesterday with u/s showing increased growth in axillary lymph node - she states she spoke with Dr Manson Passey " who suggested I see Dr Jamey Ripa to get it removed but when I called his office they said Dr Darnelle Catalan would need to speak with Dr Jamey Ripa ".  Of note [name] asked a lot of questions that she would then give an answer to - she asked this RN several in depth medical questions regarding what to do for increased growth and other inquiries about previous treatments. She requested for surgery to remove lymph node to be schedule for next week.  This RN informed [name] current questions need to be answered by MD's due to medical nature. This RN also informed pt surgery scheduling is done per surgical office only.  This RN answered appropriate nursing questions including scheduling concerns and interventions if diarrhea was to recur.   Per call plan pt is requesting is :  1. Obtain zometa next week. 2. See Dr Darnelle Catalan to discuss benefit of surgery and obtain a referral if needed. 3. See Dr Dayton Scrape on 6/26 as scheduled.  POF placed for rescheduling of zometa. This note will be given to MD for review and to obtain an appointment time.

## 2013-04-22 ENCOUNTER — Other Ambulatory Visit: Payer: Medicaid Other | Admitting: Lab

## 2013-04-22 ENCOUNTER — Telehealth: Payer: Self-pay | Admitting: *Deleted

## 2013-04-22 NOTE — Telephone Encounter (Signed)
Per staff phone call and POF I have schedueld appts.  JMW  

## 2013-04-22 NOTE — Telephone Encounter (Signed)
sw pt gv lab and tx appt for 04/25/13 starting at 12:45pm. Pt is aware.Marland Kitchentd

## 2013-04-23 ENCOUNTER — Other Ambulatory Visit: Payer: Self-pay | Admitting: *Deleted

## 2013-04-23 NOTE — Progress Notes (Signed)
This RN per MD review of contact with pt 6/20 requested referral for pt to be seen by surgeon for consult regarding suggestion per pt by radiologist to have lymph node removed.  This RN also received call and fax from the [facility] per pt's call to them for trials that would be of benefit.  Request forwarded to Medical Records.  Referral placed to surgeon.

## 2013-04-24 ENCOUNTER — Telehealth: Payer: Self-pay | Admitting: *Deleted

## 2013-04-24 ENCOUNTER — Encounter: Payer: Self-pay | Admitting: Radiation Oncology

## 2013-04-24 NOTE — Progress Notes (Signed)
Location of Breast Cancer:Right Axilla, metastatic  Histology per Pathology Report: Invasive Carcinoma  Receptor Status: ER(0%), PR (0%), Her2-neu (), Ki-67 (59%)  Did patient present with symptoms (if so, please note symptoms) or was this found on screening mammography?: Screening Mammogram  Past/Anticipated interventions by surgeon, if ZOX:WRUEA axilla needle core biopsy  Past/Anticipated interventions by medical oncology, if any: Chemotherapy prior chemotherapy for right breast cancer - 6 cycles of CMF completed on 12/12/2001, refused anti-estrogen therapy  Lymphedema issues, if any:   Pain issues, if any:  Yes, right shoulder  SAFETY ISSUES: Prior radiation? Yes,  right breast/regional lymph nodes/ 5040 cGy / 28 fractions, with a boost of 1200 cGy / 6 fractions,   Pacemaker/ICD? no  Possible current pregnancy?no  Is the patient on methotrexate? no  Current Complaints / other details:  05/16/01 - diagnosed with Invasive Mammary Carcinoma of the right breast, with a left breast adenoma.   Has bilateral mediastinal and left lung nodules, Letrozole started on 03/14/2013    Delynn Flavin, RN 04/24/2013,4:22 PM

## 2013-04-24 NOTE — Telephone Encounter (Signed)
sw pt gv appt d/t for Dr. Jamey Ripa office. Pt wants to see [facility] on 04/25/13. gv her an appt w/[facility] for 04/25/13 @ 4pm. Pt is aware...td

## 2013-04-25 ENCOUNTER — Other Ambulatory Visit (HOSPITAL_BASED_OUTPATIENT_CLINIC_OR_DEPARTMENT_OTHER): Payer: Medicaid Other

## 2013-04-25 ENCOUNTER — Encounter: Payer: Self-pay | Admitting: Specialist

## 2013-04-25 ENCOUNTER — Telehealth: Payer: Self-pay | Admitting: *Deleted

## 2013-04-25 ENCOUNTER — Encounter: Payer: Self-pay | Admitting: Radiation Oncology

## 2013-04-25 ENCOUNTER — Ambulatory Visit (HOSPITAL_BASED_OUTPATIENT_CLINIC_OR_DEPARTMENT_OTHER): Payer: Medicaid Other

## 2013-04-25 ENCOUNTER — Ambulatory Visit
Admission: RE | Admit: 2013-04-25 | Discharge: 2013-04-25 | Disposition: A | Discharge status: Home / Self Care | Payer: Medicaid Other | Source: Ambulatory Visit | Attending: Radiation Oncology | Admitting: Radiation Oncology

## 2013-04-25 ENCOUNTER — Encounter: Payer: Self-pay | Admitting: *Deleted

## 2013-04-25 ENCOUNTER — Ambulatory Visit (HOSPITAL_BASED_OUTPATIENT_CLINIC_OR_DEPARTMENT_OTHER): Payer: Medicaid Other | Admitting: Oncology

## 2013-04-25 VITALS — BP 102/64 | HR 64 | Temp 98.4°F | Resp 20 | Ht 60.0 in | Wt 117.1 lb

## 2013-04-25 VITALS — BP 102/64 | HR 64 | Temp 98.4°F | Resp 20

## 2013-04-25 VITALS — BP 99/79 | HR 89 | Temp 98.1°F | Resp 20 | Wt 117.1 lb

## 2013-04-25 DIAGNOSIS — C773 Secondary and unspecified malignant neoplasm of axilla and upper limb lymph nodes: Secondary | ICD-10-CM | POA: Insufficient documentation

## 2013-04-25 DIAGNOSIS — C7951 Secondary malignant neoplasm of bone: Secondary | ICD-10-CM

## 2013-04-25 DIAGNOSIS — C50911 Malignant neoplasm of unspecified site of right female breast: Secondary | ICD-10-CM

## 2013-04-25 DIAGNOSIS — Z923 Personal history of irradiation: Secondary | ICD-10-CM | POA: Insufficient documentation

## 2013-04-25 DIAGNOSIS — Z171 Estrogen receptor negative status [ER-]: Secondary | ICD-10-CM | POA: Insufficient documentation

## 2013-04-25 DIAGNOSIS — C7952 Secondary malignant neoplasm of bone marrow: Secondary | ICD-10-CM

## 2013-04-25 DIAGNOSIS — C50219 Malignant neoplasm of upper-inner quadrant of unspecified female breast: Secondary | ICD-10-CM

## 2013-04-25 DIAGNOSIS — C7800 Secondary malignant neoplasm of unspecified lung: Secondary | ICD-10-CM

## 2013-04-25 DIAGNOSIS — C50919 Malignant neoplasm of unspecified site of unspecified female breast: Secondary | ICD-10-CM

## 2013-04-25 DIAGNOSIS — Z9221 Personal history of antineoplastic chemotherapy: Secondary | ICD-10-CM | POA: Insufficient documentation

## 2013-04-25 DIAGNOSIS — M8580 Other specified disorders of bone density and structure, unspecified site: Secondary | ICD-10-CM

## 2013-04-25 DIAGNOSIS — G5690 Unspecified mononeuropathy of unspecified upper limb: Secondary | ICD-10-CM | POA: Insufficient documentation

## 2013-04-25 HISTORY — DX: Other specified cough: R05.8

## 2013-04-25 HISTORY — DX: Personal history of irradiation: Z92.3

## 2013-04-25 HISTORY — DX: Pain in right shoulder: M25.511

## 2013-04-25 HISTORY — DX: Personal history of antineoplastic chemotherapy: Z92.21

## 2013-04-25 HISTORY — DX: Cough: R05

## 2013-04-25 HISTORY — DX: Shortness of breath: R06.02

## 2013-04-25 HISTORY — DX: Secondary malignant neoplasm of bone: C79.51

## 2013-04-25 HISTORY — DX: Long term (current) use of aromatase inhibitors: Z79.811

## 2013-04-25 LAB — CBC WITH DIFFERENTIAL/PLATELET
Eosinophils Absolute: 0 10*3/uL (ref 0.0–0.5)
HCT: 34.4 % — ABNORMAL LOW (ref 34.8–46.6)
LYMPH%: 37.6 % (ref 14.0–49.7)
MCV: 80.6 fL (ref 79.5–101.0)
MONO#: 0.7 10*3/uL (ref 0.1–0.9)
MONO%: 9.3 % (ref 0.0–14.0)
NEUT#: 3.9 10*3/uL (ref 1.5–6.5)
NEUT%: 52.5 % (ref 38.4–76.8)
Platelets: 209 10*3/uL (ref 145–400)
RBC: 4.27 10*6/uL (ref 3.70–5.45)

## 2013-04-25 LAB — COMPREHENSIVE METABOLIC PANEL (CC13)
ALT: 17 U/L (ref 0–55)
Alkaline Phosphatase: 71 U/L (ref 40–150)
Creatinine: 0.8 mg/dL (ref 0.6–1.1)
Glucose: 89 mg/dl (ref 70–140)
Sodium: 135 mEq/L — ABNORMAL LOW (ref 136–145)
Total Bilirubin: 0.44 mg/dL (ref 0.20–1.20)
Total Protein: 7.6 g/dL (ref 6.4–8.3)

## 2013-04-25 MED ORDER — HEPARIN SOD (PORK) LOCK FLUSH 100 UNIT/ML IV SOLN
500.0000 [IU] | Freq: Once | INTRAVENOUS | Status: AC
  Administered 2013-04-25: 500 [IU] via INTRAVENOUS
  Filled 2013-04-25: qty 5

## 2013-04-25 MED ORDER — ZOLEDRONIC ACID 4 MG/100ML IV SOLN
4.0000 mg | Freq: Once | INTRAVENOUS | Status: AC
  Administered 2013-04-25: 4 mg via INTRAVENOUS
  Filled 2013-04-25: qty 100

## 2013-04-25 MED ORDER — ZOLEDRONIC ACID 4 MG/5ML IV CONC: 4.0000 mg | Freq: Once | INTRAVENOUS | Status: DC

## 2013-04-25 MED ORDER — SODIUM CHLORIDE 0.9 % IJ SOLN
10.0000 mL | INTRAMUSCULAR | Status: DC | PRN
  Administered 2013-04-25: 10 mL via INTRAVENOUS
  Filled 2013-04-25: qty 10

## 2013-04-25 NOTE — Progress Notes (Signed)
Histology and Location of Primary Cancer: breast  Sites of Visceral and Bony Metastatic Disease: C7, PET scan 03/13/13  Location(s) of Symptomatic Metastases: C7  Past/Anticipated chemotherapy by medical oncology, if any:  prior chemotherapy for right breast cancer - 6 cycles of CMF completed on 12/12/2001, refused anti-estrogen therapy  Pain on a scale of 0-10 is: 5 in back of neck   If Spine Met(s), symptoms, if any, include:  Bowel/Bladder retention or incontinence (please describe): no  Numbness or weakness in extremities (please describe): weakness of both arms, legs, numbness and weakness in right arm, right thumb and index finger  Current Decadron regimen, if applicable: no  Ambulatory status? Walker? Wheelchair?: ambulatory  SAFETY ISSUES: Prior radiation? right breast/regional lymph nodes/ 5040 cGy / 28 fractions, with a boost of 1200 cGy / 6 fractions  12/26/01- 02/12/02    Pacemaker/ICD? no  Possible current pregnancy? no  Is the patient on methotrexate? no  Current Complaints / other details:  Moderate pain in back of neck, pt states pain medications did not work as well as OTC cream and massaging the area Saw Dr Venetia Maxon 04/17/13

## 2013-04-25 NOTE — Progress Notes (Signed)
Please see the Nurse Progress Note in the MD Initial Consult Encounter for this patient. 

## 2013-04-25 NOTE — Patient Instructions (Addendum)
Zoledronic Acid injection (Paget's Disease, Osteoporosis) What is this medicine? ZOLEDRONIC ACID (ZOE le dron ik AS id) lowers the amount of calcium loss from bone. It is used to treat Paget's disease and osteoporosis in women. This medicine may be used for other purposes; ask your health care provider or pharmacist if you have questions. What should I tell my health care provider before I take this medicine? They need to know if you have any of these conditions: -aspirin-sensitive asthma -dental disease -kidney disease -low levels of calcium in the blood -past surgery on the parathyroid gland or intestines -an unusual or allergic reaction to zoledronic acid, other medicines, foods, dyes, or preservatives -pregnant or trying to get pregnant -breast-feeding How should I use this medicine? This medicine is for infusion into a vein. It is given by a health care professional in a hospital or clinic setting. Talk to your pediatrician regarding the use of this medicine in children. This medicine is not approved for use in children. Overdosage: If you think you have taken too much of this medicine contact a poison control center or emergency room at once. NOTE: This medicine is only for you. Do not share this medicine with others. What if I miss a dose? It is important not to miss your dose. Call your doctor or health care professional if you are unable to keep an appointment. What may interact with this medicine? -certain antibiotics given by injection -NSAIDs, medicines for pain and inflammation, like ibuprofen or naproxen -some diuretics like bumetanide, furosemide -teriparatide This list may not describe all possible interactions. Give your health care provider a list of all the medicines, herbs, non-prescription drugs, or dietary supplements you use. Also tell them if you smoke, drink alcohol, or use illegal drugs. Some items may interact with your medicine. What should I watch for while  using this medicine? Visit your doctor or health care professional for regular checkups. It may be some time before you see the benefit from this medicine. Do not stop taking your medicine unless your doctor tells you to. Your doctor may order blood tests or other tests to see how you are doing. Women should inform their doctor if they wish to become pregnant or think they might be pregnant. There is a potential for serious side effects to an unborn child. Talk to your health care professional or pharmacist for more information. You should make sure that you get enough calcium and vitamin D while you are taking this medicine. Discuss the foods you eat and the vitamins you take with your health care professional. Some people who take this medicine have severe bone, joint, and/or muscle pain. This medicine may also increase your risk for a broken thigh bone. Tell your doctor right away if you have pain in your upper leg or groin. Tell your doctor if you have any pain that does not go away or that gets worse. What side effects may I notice from receiving this medicine? Side effects that you should report to your doctor or health care professional as soon as possible: -allergic reactions like skin rash, itching or hives, swelling of the face, lips, or tongue -breathing problems -changes in vision -feeling faint or lightheaded, falls -jaw burning, cramping, or pain -muscle cramps, stiffness, or weakness -trouble passing urine or change in the amount of urine Side effects that usually do not require medical attention (report to your doctor or health care professional if they continue or are bothersome): -bone, joint, or muscle pain -fever -  irritation at site where injected -loss of appetite -nausea, vomiting -stomach upset -tired This list may not describe all possible side effects. Call your doctor for medical advice about side effects. You may report side effects to FDA at 1-800-FDA-1088. Where  should I keep my medicine? This drug is given in a hospital or clinic and will not be stored at home. NOTE: This sheet is a summary. It may not cover all possible information. If you have questions about this medicine, talk to your doctor, pharmacist, or health care provider.  2013, [employer]. (04/15/2011 9:08:15 AM)  

## 2013-04-25 NOTE — Progress Notes (Signed)
ID: [name]   DOB: 01-04-1951  MR#: [id number]  WUJ#:[id number]  PCP: [name], MD SU: [name], MD OTHER MD: Nicholes Mango, [name], [employer]   HISTORY OF PRESENT ILLNESS: The patient's 2002-2004 records have been retrieved. The sugar the patient underwent a right breast core biopsy 03/29/2001 for an invasive ductal carcinoma associated with necrosis ([id number]). 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 The tumor was estrogen receptor 95% positive, progesterone receptor 96% positive, with an MIB-1 of 11%, and no HER-2 amplification by the HercepTest. She completed 6 cycles of CMF chemotherapy on 12/12/2001. There were minor delays, but no dose reductions. She received radiation to the right breast and regional lymph nodes to a total of 5040 cGy with a right breast boost of 12 [name], completed 02/12/2002  More recently, "Debbie Larsen" develop some right shoulder pain sometime in late 2012. She had followup mammograms of until 2011. There was no mammogram performed in 2012. She was seen by physical therapy in orthopedic surgery for evaluation of her right shoulder pain. She had a mammogram on 07/24/2012 which showed a suspicious mass behind eczema of the right breast. Physical exam the time did show multiple involved right axillary lymph nodes. 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 HER-2 nonamplified the ratio by [facility] being 1.43. Proliferative index was  50%.   MRI scan of both breasts performed on 08/31/2012 showed multiple involved lymph nodes in the right axilla, no obvious masses in either breast. Unfortunately staging PET scan 09/12/2012 showed in addition to her right axillary recurrence, bilateral mediastinal and  left lung nodules consistent with stage IV disease. Her subsequent history is as detailed below.  INTERVAL HISTORY: Debbie Larsen returns today for followup of her metastatic breast cancer. She started the letrozole approximately a month ago and is tolerating it with no side effects that she is aware of.  REVIEW OF SYSTEMS: Debbie Larsen exercises regularly, by walking, going to the gym, and doing some resume and swimming as well. She is deeply involved in researching treatment options, and [facility] see if they have some studies she might be able to participate in. They have requested records on her, which we have faxed. She denies unusual headaches, visual changes, nausea, vomiting, dizziness, or gait imbalance. There has been no cough, phlegm production, pleurisy, or worsening shortness of breath. There has been no change in bowel or bladder habits. Would really bothers her is that the first 2 digits of the right hand don't work all that well and she has discomfort in the right axilla/medial upper right extremity areas. A detailed review of systems was otherwise stable  PAST MEDICAL HISTORY: Past Medical History  Diagnosis Date  . 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 Hypothyroid   . Enlarged lymph nodes   . BREAST CANCER 04/2001    s/p radiation therapy, chemotherapy and lumpectomy  . 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  . S/P radiation therapy     Right breast/regional lymph nodes/ 5040 cGy / 28 fractions/ bost of 1200 cGy/ 6 Fractions  . Status post chemotherapy     6 cycles of CMF completed on 12/12/2001  . Right  shoulder pain     started late 2012  . Dry cough     intermittent  . SOB (shortness of breath)   . Use of letrozole (Femara) 03/14/2013  . Metastasis to bone 03/13/13    PET scan results  . Metastasis to bone     breast primary    PAST SURGICAL HISTORY: Past Surgical  History  Procedure Laterality Date  . Tonsillectomy    . Breast lumpectomy  04/2001    excision of L breast fibroadenoma by Dr.   . Breast lumpectomy  05/16/2001    Right axillary sentinel lymph node biopsy.  . Portacath placement  09/19/2012    Procedure: INSERTION PORT-A-CATH;  Surgeon: [name], MD;  Location: [facility] OR;  Service: General;  Laterality: N/A;    FAMILY HISTORY Family History  Problem Relation Age of Onset  . Diabetes Mother   . Hypertension Father   . Hypertension Sister   . Hypothyroidism Brother   . Cancer Neg Hx   The patient's father died from heart disease at the age of 68. The patient's mother died 07-25-2012 at the age of 2 also from heart disease. The patient has 2 sisters and 2 brothers. There is no history of breast or ovarian cancer in the family to her knowledge.  GYNECOLOGIC HISTORY: G1 P1, first live birth age 83. She underwent menopause at the time of her chemotherapy in 2002. She did not use hormone replacement.  SOCIAL HISTORY: The patient is divorced and lives by herself. Her son Tomisha Reppucci lives in [place], where he works as an Publishing copy. The patient has no grandchildren. She attends the local [employer].   ADVANCED DIRECTIVES: The patient tells me (11/01/2012) her healthcare power of attorney is her sister Angelee Bahr. She was unable to give me her contact information   HEALTH MAINTENANCE: History  Substance Use Topics  . Smoking status: Never Smoker   . Smokeless tobacco: Never Used  . Alcohol Use: No     Colonoscopy: never  PAP: 08/17/2012  Bone density: never  Lipid panel: Not on file  No Known Allergies  Current Outpatient Prescriptions  Medication Sig Dispense Refill  . letrozole (FEMARA) 2.5 MG tablet Take 1 tablet (2.5 mg total) by mouth daily.  30 tablet  12  . levothyroxine (SYNTHROID, LEVOTHROID) 100 MCG tablet Take 1 tablet (100 mcg total) by mouth daily before breakfast.  30 tablet  6  .  lidocaine-prilocaine (EMLA) cream Apply topically as needed. Apply to port 1-2 hours before procedure  30 g  0   No current facility-administered medications for this visit.   Facility-Administered Medications Ordered in Other Visits  Medication Dose Route Frequency Provider Last Rate Last Dose  . heparin lock flush 100 unit/mL  500 Units Intravenous Once Lowella Dell, MD      . sodium chloride 0.9 % injection 10 mL  10 mL Intravenous PRN Lowella Dell, MD        OBJECTIVE: Middle-aged [place] woman in no acute distress Filed Vitals:   04/25/13 1458  BP: 102/64  Pulse: 64  Temp: 98.4 F (36.9 C)  Resp: 20  Body mass index is 22.87 kg/(m^2). ECOG: 1 Filed Vitals:   04/25/13 1458  Height: 5' (1.524 m)  Weight: 117 lb 1.6 oz (53.116 kg)   Sclerae unicteric Oropharynx clear No cervical or supraclavicular adenopathy, fair ROM neck on flexion-extension and rotation Lungs no rales or rhonchi, good excursion bilaterally Heart regular  rate and rhythm Abd soft, nontender, positive bowel sounds MSK no focal spinal tenderness, no peripheral edema; Neuro:  right upper arm strength is 4+ over 5. She has good grip with the third, fourth and fifth digits of the right hand.. Well oriented, some pressure of speech, but well oriented and pleasant and affect  Breasts: The right breast is status post lumpectomy and radiation. There is a stable hard palpable mass in the right axilla. There is no evidence of chest wall involvement. Left breast is unremarkable   LAB RESULTS: Lab Results  Component Value Date   WBC 7.5 04/25/2013   NEUTROABS 3.9 04/25/2013   HGB 11.3* 04/25/2013   HCT 34.4* 04/25/2013   MCV 80.6 04/25/2013   PLT 209 04/25/2013      Chemistry      Component Value Date/Time   NA 135* 04/25/2013 1316   NA 138 09/18/2012 1531   K 3.9 04/25/2013 1316   K 3.7 09/18/2012 1531   CL 106 03/21/2013 1422   CL 103 09/18/2012 1531   CO2 23 04/25/2013 1316   CO2 23 09/18/2012  1531   BUN 11.2 04/25/2013 1316   BUN 9 09/18/2012 1531   CREATININE 0.8 04/25/2013 1316   CREATININE 0.70 09/18/2012 1531   CREATININE 0.74 03/01/2012 1715      Component Value Date/Time   CALCIUM 9.3 04/25/2013 1316   CALCIUM 9.8 09/18/2012 1531   ALKPHOS 71 04/25/2013 1316   ALKPHOS 81 09/18/2012 1531   AST 21 04/25/2013 1316   AST 18 09/18/2012 1531   ALT 17 04/25/2013 1316   ALT 13 09/18/2012 1531   BILITOT 0.44 04/25/2013 1316   BILITOT 0.3 09/18/2012 1531       Lab Results  Component Value Date   LABCA2 27 12/27/2012     STUDIES: US Breast Right  04/18/2013   *RADIOLOGY REPORT*  Clinical Data:  History of right breast cancer diagnosed in 2002. The patient had two positive sentinel lymph nodes.  At that time, the patient declined at completion axillary node dissection.  In October 2013, the patient had a right axillary recurrence diagnosed on ultrasound guided core biopsy. PET CT confirmed metastatic disease, and she was treated with chemotherapy.  Today's exam is performed as a followup.  The patient reports bilateral breast pain.  DIGITAL DIAGNOSTIC BILATERAL MAMMOGRAM WITH CAD AND RIGHT BREAST ULTRASOUND:  Comparison:  07/24/2012 and earlier  Findings:  ACR Breast Density Category 2: There is a scattered fibroglandular pattern.  Postoperative changes are seen in the upper portion of the right breast. No suspicious mass, distortion, or microcalcifications are identified to suggest presence of malignancy.  Mammographic images were processed with CAD.  On physical exam, I palpate a mass in the right axilla, medially deep to the axillary scar.  Ultrasound is performed, showing an irregular hypoechoic mass in the right axilla, measuring 2.4 x 1.6 cm.  Previously, this measured 1.3 x 1.5 cm.  Mass is associated dense acoustic shadowing, consistent with known malignancy.  IMPRESSION:  1.  No evidence for abnormality within either breast. 2.  Right axillary mass is larger than on the prior  ultrasound exam, consistent with malignancy.  RECOMMENDATION:  1.  Treatment plan 2.  The patient is scheduled to see Drs. Wendee Hata and Streck next week.  I have discussed the findings and recommendations with the patient. Results were also provided in writing at the conclusion of the visit.  If applicable, a reminder letter will be sent to the patient  regarding the next appointment.  BI-RADS CATEGORY 6:  Known biopsy-proven malignancy - appropriate action should be taken.   Original Report Authenticated By: Norva Pavlov, M.D.   Mm Digital Diagnostic Bilat  04/18/2013   *RADIOLOGY REPORT*  Clinical Data:  History of right breast cancer diagnosed in 2002. The patient had two positive sentinel lymph nodes.  At that time, the patient declined at completion axillary node dissection.  In October 2013, the patient had a right axillary recurrence diagnosed on ultrasound guided core biopsy. PET CT confirmed metastatic disease, and she was treated with chemotherapy.  Today's exam is performed as a followup.  The patient reports bilateral breast pain.  DIGITAL DIAGNOSTIC BILATERAL MAMMOGRAM WITH CAD AND RIGHT BREAST ULTRASOUND:  Comparison:  07/24/2012 and earlier  Findings:  ACR Breast Density Category 2: There is a scattered fibroglandular pattern.  Postoperative changes are seen in the upper portion of the right breast. No suspicious mass, distortion, or microcalcifications are identified to suggest presence of malignancy.  Mammographic images were processed with CAD.  On physical exam, I palpate a mass in the right axilla, medially deep to the axillary scar.  Ultrasound is performed, showing an irregular hypoechoic mass in the right axilla, measuring 2.4 x 1.6 cm.  Previously, this measured 1.3 x 1.5 cm.  Mass is associated dense acoustic shadowing, consistent with known malignancy.  IMPRESSION:  1.  No evidence for abnormality within either breast. 2.  Right axillary mass is larger than on the prior ultrasound  exam, consistent with malignancy.  RECOMMENDATION:  1.  Treatment plan 2.  The patient is scheduled to see Drs. Rashad Obeid and Streck next week.  I have discussed the findings and recommendations with the patient. Results were also provided in writing at the conclusion of the visit.  If applicable, a reminder letter will be sent to the patient regarding the next appointment.  BI-RADS CATEGORY 6:  Known biopsy-proven malignancy - appropriate action should be taken.   Original Report Authenticated By: Norva Pavlov, M.D.    ASSESSMENT: 62 y.o. [place] woman originally from [place] with stage IV breast cancer:  (1) Status post right lumpectomy and sentinel lymph node sampling 05/16/2001 for a pT1c pN1, stage IIA invasive ductal carcinoma, grade 2, estrogen receptor 95% and progesterone receptor 96% positive, with an MIB-1 of 11% and no HER-2 amplification by immunocytochemistry.  (2) Treated adjuvantly with cyclophosphamide, methotrexate and fluorouracil, likely for 8 cycles (data unavailable), completed February 2003.  (3) Completed radiation to the right breast and regional lymph nodes 02/12/2002.  (4) The patient apparently refused completion axillary lymph node dissection and antiestrogen therapy (data unavailable).  (5) Recurrent disease documented by right axillary lymph node biopsy 08/23/2012, showing a high-grade invasive ductal carcinoma which was estrogen and progesterone receptor negative, with an MIB-1 of 59%, and no HER-2 amplification; staging studies showed hyper metabolic mediastinal and left lung lesions, with no involvement of the liver or brain  (6) Abraxane every 2 weeks started 09/20/2012, with good tolerance and evidence of response, completing 12 doses 02/21/2013.  (7) restaging studies at [facility] March 2014 and here April-May 2014 as follows  (a) bone involvement: [facility] bone scan showed focal tracer activity in the left acetabulum, left 12th rib posteriorly, and right sixth rib  posteriorly; on CT scan a 9 mm sclerotic sacral lesion was new as compared to November of 2013.  (b) [facility] CT of the chest, abdomen and pelvis showed slight decrease in size of some mediastinal lymph nodes, persistent unchanged nodularity  in the right axilla, normal liver, and no new or enlarging pulmonary nodules  (c) MRI of the cervical spine shows no obvious lesion to explain the patient's Right arm symptoms  (d) PET scan shows multiple bone lesions (including C7), mild growth in index lymph nodes, no change in possible left lung nodules, and no liver involvement  (7) genetic testing obtained at [facility] 03/19/2013, results pending  (8) zolendronate started 02/21/2013, continuing monthly   (9) nonspecific myositis RUE documented at [facility] by [facility]G March 2014; cervical MRI DOES not explain symptoms, which are likely due to a right axillary radiculopathy  (10) letrozole started 03/14/2013   PLAN:  Debbie Larsen is tolerating the letrozole without any side effects that she is aware of. Of course this is really a "[facility] pass", and I hope that at least part of her tumor may have remained estrogen receptor positive. She is a ready scheduled for repeat PET scan July 15, and she will see me 2 days later to review results. If there has been no evidence of response we will likely resume chemotherapy with Abraxane. It gets to approximately 2 or 2-1/2 weeks. In that. She is hoping to get some radiation to her neck lesion and possibly have some surgery under [facility].   She asked me if I thought surgery to the right axilla might make her hand work better and I told her it might make her hand worse, and might also a cause lymphedema. We have discussed this previously but she is intent on  removing the right axillary lymph node "that grew" and if that is done she is interested in obtaining a chemosensitivity study. I have alerted Dr. Jamey Ripa to that possibility.  I again encouraged her to discuss her situation with her  son. She tells me he is on a business trip to [place] and she does not want to cause him any worries. She knows to call for any problems that may develop before her return visit here.    Lowella Dell, MD  04/25/2013

## 2013-04-25 NOTE — Telephone Encounter (Signed)
On 04/23/2013 this RN received a call from Kelli Hope at the [facility] requesting records per pt's call to them and lengthy discussion inquiring about possible trials that could benefit her.  This RN obtained faxed request verifying above and forwarded fax request to [facility] for allocation of medical records.  Message left today by Kelli Hope stating she is still waiting for records. Message forwarded to Laurel Dimmer in [facility] for follow up.

## 2013-04-25 NOTE — Progress Notes (Signed)
The patient came to my office to inquire about several things: how she could engage the palliative care team at our [facility] and how she find out more about available clinical trials.  I suggested she talk either with her physician, with Mena Pauls, RN, or with one of the breast cancer navigators.  I listened to her for quite awhile, as she talked about her desire for more information about options for treatment and services.  She also indicated she wished to complete a healthcare power of attorney, and I suggested she call Kathrin Penner, social worker, to assist her.  I gave her Lauren's contact information.

## 2013-04-25 NOTE — Progress Notes (Signed)
Followup/reconsultation note:  CC: Dr. Marikay Alar Magrinat, Dr. Maeola Harman, Dr. Margaretmary Dys, Dr. 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 I first met the patient in August of 2002 when she presented with T1 N1b  invasive ductal carcinoma of the right breast. On 05/16/2001 she underwent a right partial mastectomy and a sentinel lymph node biopsy. One of 2 lymph nodes contained metastatic disease. The tumor was strongly ER/PR positive and HER-2 negative with a low proliferation index. At that time she declined a completion axillary node dissection. She completed 6 cycles of CMF chemotherapy and then received right breast and nodal irradiation and completed this on 02/12/2002. She states that she developed right shoulder discomfort in late 2012. She is found to have multiple right axillary lymph nodes and biopsy of a right axillary node on 08/23/2012 showed high-grade invasive ductal carcinoma which was ER/PR negative without HER-2/neu amplification. MRI of both breasts on 08/31/2012 showed multiple involved lymph nodes the right axilla. A PET scan on 09/12/2012 showed a right axillary recurrence but also bilateral mediastinal and left lung nodules consistent with stage IV disease. She was treated with Abraxane with evidence of response from November 2013 through April 2014. Restaging studies at [facility] and here showed metastatic disease to bone [facility] bone scan) and regression of mediastinal adenopathy but persistent nodularity within the right axilla consistent with an axillary recurrence. A PET scan on 03/13/2013 showed worsening right axillary disease in addition to worsening right peritracheal adenopathy with additional mediastinal and left hilar adenopathy. She had interval  development of bony metastatic disease to C7 vertebral body and pelvic bones. She tells me she developed progressive weakness of her right hand in the fall of 2013, but this remained stable during her chemotherapy. There was concern that she had a neuropathy that conceivably could be secondary to previous radiation therapy from 12 years ago. She had a cervical MRI scan on 02/21/2013 with "marginally diagnostic images" because of motion degradation. There was nothing obvious to explain her neuropathy.  I understand that she is being referred to Dr. Laroy Apple for further evaluation. She was started on letrozole on 03/14/2013. She does report slight discomfort along her posterior neck/cervical spine which may be related to movement.  Physical examination: Alert and oriented. She is in no acute distress. Head neck examination: Grossly unremarkable. Nodes: There is no palpable cervical or supraclavicular lymphadenopathy. There is a 2.5-3 cm mass just deep to her right axillary scar from her previous sentinel lymph node biopsy. Breasts: No masses are palpable. Abdomen: Without hepatomegaly. Neurologic examination: Cranial nerves 2-12 intact. Motor examination there is decreased sensation to light touch on the right thumb, index, and middle finger. There is atrophy of the right shoulder muscles. She has slight weakness of her right grip with significant weakness of right thumb abduction.  Laboratory data: Lab Results  Component Value Date   WBC 7.5 04/25/2013   HGB 11.3* 04/25/2013   HCT 34.4* 04/25/2013   MCV 80.6 04/25/2013   PLT 209 04/25/2013    Impression: Recurrent and metastatic invasive ductal carcinoma of the right breast. She does have right upper extremity symptoms referable to the C6/C7 dermatome. I reviewed her previous radiation therapy fields and dosimetry, and there is no reason to believe that she has a radiation  therapy plexopathy or neuropathy. Furthermore, her symptoms seem to be coincidental  with her axillary recurrence. 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 It is unclear as to whether not she has metastatic disease to her cervical spine as suggested by her PET scan, that could explain her right hand weakness/numbness. I would suggest repeating her MRI scan with sedation to get a better look at her lower cervical spine/brachial plexus. I understand that she was seen by Dr. Venetia Maxon who obtained plain films of her cervical spine on June 18. The report is not yet available. If she is felt to have symptomatic disease from her cervical spine that she can be treated with conventional radiation therapy. She is not a candidate for further radiation therapy to her right axilla.  Plan: I have requested that she return here in 2 weeks for a followup visit after she has been evaluated by Dr. Venetia Maxon and Dr. Teressa Senter.   40 minutes was spent face-to-face with the patient, primarily counseling the patient and coordinating her care.

## 2013-04-26 ENCOUNTER — Other Ambulatory Visit: Payer: Self-pay | Admitting: Family Medicine

## 2013-04-30 ENCOUNTER — Telehealth: Payer: Self-pay | Admitting: *Deleted

## 2013-04-30 NOTE — Telephone Encounter (Signed)
This RN received VM from Ralph Leyden with [facility] stating she has still not received requested medical records. Pt is calling her very concerned due to the patients desire to know if there are clinical trials available for her.  Per message from [place] she asked for " any records to start with and then I can't get her complete set per request I faxed to you " Note Aram Beecham refaxed request.  This RN obtained most recent dictations, labs, only noted pathology and recent scans and faxed them to [place]. This RN will hand deliver request to medical records for additional information.

## 2013-05-02 ENCOUNTER — Ambulatory Visit (INDEPENDENT_AMBULATORY_CARE_PROVIDER_SITE_OTHER): Payer: Medicaid Other | Admitting: Surgery

## 2013-05-02 ENCOUNTER — Encounter (INDEPENDENT_AMBULATORY_CARE_PROVIDER_SITE_OTHER): Payer: Self-pay | Admitting: Surgery

## 2013-05-02 VITALS — BP 118/70 | HR 82 | Resp 18 | Ht 60.0 in | Wt 116.0 lb

## 2013-05-02 DIAGNOSIS — C50919 Malignant neoplasm of unspecified site of unspecified female breast: Secondary | ICD-10-CM

## 2013-05-02 NOTE — Patient Instructions (Signed)
We will schedule surgery-removal of the lymph nodes from the right armpit. We will need to keep you in the hospital after surgery for a day or 2.

## 2013-05-02 NOTE — Progress Notes (Signed)
NAME: Debbie Larsen       DOB: 05/21/1951           DATE: 05/02/2013       MRN:[id number]  CC:  Chief Complaint  Patient presents with  . Breast Cancer    Post chemo    HPI: this patient presented several months ago with right axillary lymphadenopathy which on biopsy proved to be recurrent right breast cancer. She also has distant disease. She's had about 6 months of chemotherapy. There's been little change in the axilla. She is having pain in the axillary area and down her arm. Her oncologist sent her back to consider an axillary dissection at this time.she believes she is otherwise stable health. There've been no other medical problems.  Past history family history etc. Are noted in electronic medical record placed into this note.  EXAM: VITAL SIGNS:  BP 118/70  Pulse 82  Resp 18  Ht 5' (1.524 m)  Wt 116 lb (52.617 kg)  BMI 22.65 kg/m2  GENERAL:  The patient is alert, oriented, and generally healthy-appearing, NAD. Mood and affect are normal.  HEENT:  The head is normocephalic, the eyes nonicteric, the pupils were round regular and equal. EOMs are normal. Pharynx normal. Dentition good.  NECK:  The neck is supple and there are no masses or thyromegaly.  LUNGS: Normal respirations and clear to auscultation.  HEART: Regular rhythm, with no murmurs rubs or gallops. Pulses are intact carotid dorsalis pedis and posterior tibial. No significant varicosities are noted.  BREASTS:  there is no masses in the breasts. They're both well healed.  Lymphatics: There is fixed axillary adenopathy the right side quite hard, smaller than I remember (although larger by ultrasound)  ABDOMEN: Soft, flat, and nontender. No masses or organomegaly is noted. No hernias are noted. Bowel sounds are normal.  EXTREMITIES:  Good range of motion, no edema.  IMP: recurrent breast cancer with fixed nodes in the right axilla  PLAN: right axillary dissection. I had a lengthy discussion with the  patient about the surgery. We went over in detail the surgery, potential complications such as lymphedema bleeding infection and neurologic injury. I've also told her that I don't think the surgery will effect her long-term survival. She said she did having some surgery done on her breast and she is no evidence of recurrent breast cancer in the breast. In addition, she continues to have distant disease. I doubt that she has a complete understanding of the benefit of the axillary dissection but is concerned because she is having painand is hopeful that this will improve the symptoms. I told her that she may have yet recurrent disease in her axilla despite surgery and we are just trying to debulk the tumor to give her some improvement in symptoms. After a lengthy discussion I think she does understand this. She is anxious to go ahead and schedule surgery.I spent about 30 minutes in counseling, 45 total  Warren Lindahl J 05/02/2013   

## 2013-05-06 ENCOUNTER — Telehealth: Payer: Self-pay | Admitting: Radiation Oncology

## 2013-05-06 NOTE — Telephone Encounter (Signed)
Faxed face sheet, FUN 04/25/13, path 08/23/12, path 05/16/01, path 03/29/01 to [facility], [phone].  OK per SW in RJM's absence.  Received confirmation.

## 2013-05-08 NOTE — Progress Notes (Addendum)
Histology and Location of Primary Cancer: breast   Sites of Visceral and Bony Metastatic Disease: C7, PET scan 03/13/13   Location(s) of Symptomatic Metastases: C7 , r axilla  Past/Anticipated chemotherapy by medical oncology, if any: prior chemotherapy for right breast cancer - 6 cycles of CMF completed on 12/12/2001, refused anti-estrogen therapy   Pain on a scale of 0-10 is: 0  If Spine Met(s), symptoms, if any, include:  Bowel/Bladder retention or incontinence (please describe): no  Numbness or weakness in extremities (please describe): weakness of both arms, legs, numbness and weakness in right arm, right thumb and index finger  Current Decadron regimen, if applicable: no Ambulatory status? Walker? Wheelchair?: ambulatory   SAFETY ISSUES:  Prior radiation? right breast/regional lymph nodes/ 5040 cGy / 28 fractions, with a boost of 1200 cGy / 6 fractions 12/26/01- 02/12/02  Pacemaker/ICD? no  Possible current pregnancy? no  Is the patient on methotrexate? no  Current Complaints / other details: Saw Dr Venetia Maxon 04/17/13 and Dr. Jamey Ripa 05/02/2013. Pt scheduled for axillary dissection 05/15/13, Dr Venetia Maxon. She states she is unsure about surgery, fearing pain from the surgery. She denies pain today, states her right arm "is the same".

## 2013-05-09 ENCOUNTER — Encounter (HOSPITAL_COMMUNITY): Payer: Self-pay | Admitting: *Deleted

## 2013-05-09 ENCOUNTER — Ambulatory Visit
Admission: RE | Admit: 2013-05-09 | Discharge: 2013-05-09 | Disposition: A | Discharge status: Home / Self Care | Payer: Medicaid Other | Source: Ambulatory Visit | Attending: Radiation Oncology | Admitting: Radiation Oncology

## 2013-05-09 ENCOUNTER — Encounter (HOSPITAL_COMMUNITY): Payer: Self-pay | Admitting: Pharmacy Technician

## 2013-05-09 ENCOUNTER — Encounter: Payer: Self-pay | Admitting: Radiation Oncology

## 2013-05-09 ENCOUNTER — Inpatient Hospital Stay (HOSPITAL_COMMUNITY): Admission: RE | Admit: 2013-05-09 | Payer: Medicaid Other | Source: Ambulatory Visit

## 2013-05-09 ENCOUNTER — Other Ambulatory Visit: Payer: Self-pay | Admitting: Radiation Oncology

## 2013-05-09 VITALS — BP 121/78 | HR 73 | Temp 97.7°F | Resp 20 | Wt 116.0 lb

## 2013-05-09 DIAGNOSIS — C7951 Secondary malignant neoplasm of bone: Secondary | ICD-10-CM

## 2013-05-09 DIAGNOSIS — M549 Dorsalgia, unspecified: Secondary | ICD-10-CM | POA: Insufficient documentation

## 2013-05-09 DIAGNOSIS — C7949 Secondary malignant neoplasm of other parts of nervous system: Secondary | ICD-10-CM

## 2013-05-09 DIAGNOSIS — C50919 Malignant neoplasm of unspecified site of unspecified female breast: Secondary | ICD-10-CM | POA: Insufficient documentation

## 2013-05-09 NOTE — Progress Notes (Signed)
Need orders please pt will be Same Day surgery on 05/15/13 - thank you

## 2013-05-09 NOTE — Progress Notes (Signed)
CC: Dr. Marikay Alar Magrinat, Dr. Maeola Harman, Dr. Margaretmary Dys, Dr. Laroy Apple  Followup note: Debbie Larsen visits today after being seen by me approximately 2 weeks ago for evaluation of her recurrent and metastatic invasive ductal carcinoma the right breast. I did not not feel that she had a radiation therapy plexopathy or neuropathy based on her radiation therapy fields and doses. My impression was that perhaps her axillary recurrence may be responsible for her neurologic symptoms. However, she is having worsening mid to lower cervical spine pain, and she wonders if she is a candidate for palliative radiation therapy. I do not have Dr. Forest Gleason x-ray report from cervical spine x-rays done on June 18. The report will be faxed to [place] today. Dr. Venetia Maxon I raised the possibility of having her undergo a repeat MRI scan with sedation to get a better look at her lower cervical spine and brachial plexus. She plans to see Dr. Teressa Senter following her axillary surgery which has been scheduled by Dr. Jamey Ripa for next week.  Physical examination: There is pain described along her C5-C6 vertebra. She has normal flexion, extension and rotation of her cervical spine. Neurologic examination is unchanged.  Impression: Rule out the possibility of metastatic disease to her cervical spine which may be contributing to her right upper extremity neurologic deficits.  Plan: I plan to review her cervical spine x-ray report from Dr. Venetia Maxon later today. I'll speak with Dr. Kathrynn Running to see if he and Dr. Venetia Maxon want to repeat her MRI scan with sedation.Marland Kitchen She would very much like to see Dr. Kathrynn Running for a followup visit, particularly she is a candidate for radiosurgery, in early August.

## 2013-05-10 ENCOUNTER — Other Ambulatory Visit (INDEPENDENT_AMBULATORY_CARE_PROVIDER_SITE_OTHER): Payer: Self-pay | Admitting: Surgery

## 2013-05-10 ENCOUNTER — Encounter: Payer: Self-pay | Admitting: *Deleted

## 2013-05-10 NOTE — Progress Notes (Signed)
[facility] Advance Directives Clinical Social Work  Clinical Social Work was referred by patient to review and complete healthcare advance directives.  Clinical Social Worker met with patient in CSW office.  The patient designated her brother as their primary healthcare agent and sister as their secondary agent.  Patient chose not to complete living will at this time.    Clinical Social Worker notarized documents and made copies for patient/family. Clinical Social Worker will send documents to medical records to be scanned into patient's chart. Clinical Social Worker encouraged patient/family to contact with any additional questions or concerns.  Ms. Cutsforth also requested CSW complete form for Medicaid home care services to utilize after her upcoming surgery. CSW will send to MD to be signed and faxed.  Kathrin Penner, MSW, LCSW Clinical Social Worker [facility] [phone]

## 2013-05-14 ENCOUNTER — Other Ambulatory Visit (HOSPITAL_COMMUNITY): Payer: Medicaid Other

## 2013-05-15 ENCOUNTER — Ambulatory Visit: Payer: Medicaid Other | Admitting: Oncology

## 2013-05-15 ENCOUNTER — Encounter (HOSPITAL_COMMUNITY): Payer: Self-pay | Admitting: *Deleted

## 2013-05-15 ENCOUNTER — Encounter (HOSPITAL_COMMUNITY)
Admission: RE | Disposition: A | Discharge status: Home / Self Care | Payer: Self-pay | Source: Ambulatory Visit | Attending: Surgery

## 2013-05-15 ENCOUNTER — Other Ambulatory Visit: Payer: Self-pay | Admitting: Radiation Therapy

## 2013-05-15 ENCOUNTER — Ambulatory Visit (HOSPITAL_COMMUNITY)
Admission: RE | Admit: 2013-05-15 | Discharge: 2013-05-16 | Disposition: A | Discharge status: Home / Self Care | Payer: Medicaid Other | Source: Ambulatory Visit | Attending: Surgery | Admitting: Surgery

## 2013-05-15 ENCOUNTER — Encounter (HOSPITAL_COMMUNITY): Payer: Self-pay | Admitting: Certified Registered Nurse Anesthetist

## 2013-05-15 ENCOUNTER — Ambulatory Visit (HOSPITAL_COMMUNITY): Payer: Medicaid Other | Admitting: Certified Registered Nurse Anesthetist

## 2013-05-15 DIAGNOSIS — C50919 Malignant neoplasm of unspecified site of unspecified female breast: Secondary | ICD-10-CM | POA: Diagnosis present

## 2013-05-15 DIAGNOSIS — C7951 Secondary malignant neoplasm of bone: Secondary | ICD-10-CM

## 2013-05-15 DIAGNOSIS — Z853 Personal history of malignant neoplasm of breast: Secondary | ICD-10-CM

## 2013-05-15 DIAGNOSIS — E039 Hypothyroidism, unspecified: Secondary | ICD-10-CM | POA: Insufficient documentation

## 2013-05-15 DIAGNOSIS — C772 Secondary and unspecified malignant neoplasm of intra-abdominal lymph nodes: Secondary | ICD-10-CM | POA: Insufficient documentation

## 2013-05-15 DIAGNOSIS — C773 Secondary and unspecified malignant neoplasm of axilla and upper limb lymph nodes: Secondary | ICD-10-CM

## 2013-05-15 DIAGNOSIS — C50911 Malignant neoplasm of unspecified site of right female breast: Secondary | ICD-10-CM

## 2013-05-15 DIAGNOSIS — Z9221 Personal history of antineoplastic chemotherapy: Secondary | ICD-10-CM | POA: Insufficient documentation

## 2013-05-15 DIAGNOSIS — C7800 Secondary malignant neoplasm of unspecified lung: Secondary | ICD-10-CM | POA: Insufficient documentation

## 2013-05-15 HISTORY — PX: LIPOMA EXCISION: SHX5283

## 2013-05-15 SURGERY — EXCISION LIPOMA
Anesthesia: General | Site: Abdomen | Laterality: Right | Wound class: Clean

## 2013-05-15 MED ORDER — FENTANYL CITRATE 0.05 MG/ML IJ SOLN
25.0000 ug | INTRAMUSCULAR | Status: DC | PRN
  Administered 2013-05-15 (×2): 50 ug via INTRAVENOUS

## 2013-05-15 MED ORDER — LETROZOLE 2.5 MG PO TABS
2.5000 mg | ORAL_TABLET | Freq: Every day | ORAL | Status: DC
  Filled 2013-05-15 (×2): qty 1

## 2013-05-15 MED ORDER — LEVOTHYROXINE SODIUM 75 MCG PO TABS
75.0000 ug | ORAL_TABLET | Freq: Every day | ORAL | Status: DC
  Administered 2013-05-15: 75 ug via ORAL
  Filled 2013-05-15 (×3): qty 1

## 2013-05-15 MED ORDER — LACTATED RINGERS IV SOLN
INTRAVENOUS | Status: DC | PRN
  Administered 2013-05-15: 10:00:00 via INTRAVENOUS

## 2013-05-15 MED ORDER — BUPIVACAINE HCL 0.25 % IJ SOLN
INTRAMUSCULAR | Status: DC | PRN
  Administered 2013-05-15: 30 mL

## 2013-05-15 MED ORDER — CEFAZOLIN SODIUM 1-5 GM-% IV SOLN
1.0000 g | Freq: Four times a day (QID) | INTRAVENOUS | Status: AC
  Administered 2013-05-15 – 2013-05-16 (×3): 1 g via INTRAVENOUS
  Filled 2013-05-15 (×3): qty 50

## 2013-05-15 MED ORDER — PROMETHAZINE HCL 25 MG/ML IJ SOLN: 6.2500 mg | INTRAMUSCULAR | Status: DC | PRN

## 2013-05-15 MED ORDER — CEFAZOLIN SODIUM-DEXTROSE 2-3 GM-% IV SOLR
2.0000 g | INTRAVENOUS | Status: AC
  Administered 2013-05-15: 2 g via INTRAVENOUS

## 2013-05-15 MED ORDER — ONDANSETRON HCL 4 MG/2ML IJ SOLN
INTRAMUSCULAR | Status: DC | PRN
  Administered 2013-05-15: 4 mg via INTRAVENOUS

## 2013-05-15 MED ORDER — HEPARIN SODIUM (PORCINE) 5000 UNIT/ML IJ SOLN
5000.0000 [IU] | Freq: Three times a day (TID) | INTRAMUSCULAR | Status: DC
  Filled 2013-05-15 (×4): qty 1

## 2013-05-15 MED ORDER — DEXAMETHASONE SODIUM PHOSPHATE 10 MG/ML IJ SOLN
INTRAMUSCULAR | Status: DC | PRN
  Administered 2013-05-15: 10 mg via INTRAVENOUS

## 2013-05-15 MED ORDER — KETOROLAC TROMETHAMINE 30 MG/ML IJ SOLN: 15.0000 mg | Freq: Once | INTRAMUSCULAR | Status: DC | PRN

## 2013-05-15 MED ORDER — SODIUM CHLORIDE 0.9 % IR SOLN
Status: DC | PRN
  Administered 2013-05-15: 1000 mL

## 2013-05-15 MED ORDER — CHLORHEXIDINE GLUCONATE 4 % EX LIQD
1.0000 "application " | Freq: Once | CUTANEOUS | Status: DC
  Filled 2013-05-15: qty 15

## 2013-05-15 MED ORDER — ONDANSETRON HCL 4 MG PO TABS: 4.0000 mg | ORAL_TABLET | Freq: Four times a day (QID) | ORAL | Status: DC | PRN

## 2013-05-15 MED ORDER — PHENYLEPHRINE HCL 10 MG/ML IJ SOLN
INTRAMUSCULAR | Status: DC | PRN
  Administered 2013-05-15: 80 ug via INTRAVENOUS
  Administered 2013-05-15: 40 ug via INTRAVENOUS
  Administered 2013-05-15: 80 ug via INTRAVENOUS

## 2013-05-15 MED ORDER — EPHEDRINE SULFATE 50 MG/ML IJ SOLN
INTRAMUSCULAR | Status: DC | PRN
  Administered 2013-05-15: 5 mg via INTRAVENOUS

## 2013-05-15 MED ORDER — MIDAZOLAM HCL 5 MG/5ML IJ SOLN
INTRAMUSCULAR | Status: DC | PRN
  Administered 2013-05-15: 2 mg via INTRAVENOUS

## 2013-05-15 MED ORDER — FENTANYL CITRATE 0.05 MG/ML IJ SOLN
INTRAMUSCULAR | Status: DC | PRN
  Administered 2013-05-15 (×4): 50 ug via INTRAVENOUS

## 2013-05-15 MED ORDER — OXYCODONE-ACETAMINOPHEN 5-325 MG PO TABS
1.0000 | ORAL_TABLET | ORAL | Status: DC | PRN
  Administered 2013-05-16 (×2): 1 via ORAL
  Filled 2013-05-15: qty 2
  Filled 2013-05-15: qty 1

## 2013-05-15 MED ORDER — METOCLOPRAMIDE HCL 5 MG/ML IJ SOLN
INTRAMUSCULAR | Status: DC | PRN
  Administered 2013-05-15: 10 mg via INTRAVENOUS

## 2013-05-15 MED ORDER — LIDOCAINE HCL (CARDIAC) 20 MG/ML IV SOLN
INTRAVENOUS | Status: DC | PRN
  Administered 2013-05-15: 100 mg via INTRAVENOUS

## 2013-05-15 MED ORDER — PROPOFOL 10 MG/ML IV BOLUS
INTRAVENOUS | Status: DC | PRN
  Administered 2013-05-15: 150 mg via INTRAVENOUS

## 2013-05-15 MED ORDER — ONDANSETRON HCL 4 MG/2ML IJ SOLN: 4.0000 mg | Freq: Four times a day (QID) | INTRAMUSCULAR | Status: DC | PRN

## 2013-05-15 MED ORDER — MORPHINE SULFATE 2 MG/ML IJ SOLN
2.0000 mg | INTRAMUSCULAR | Status: DC | PRN
  Administered 2013-05-15: 2 mg via INTRAVENOUS
  Filled 2013-05-15: qty 1

## 2013-05-15 SURGICAL SUPPLY — 36 items
APL SKNCLS STERI-STRIP NONHPOA (GAUZE/BANDAGES/DRESSINGS)
BENZOIN TINCTURE PRP APPL 2/3 (GAUZE/BANDAGES/DRESSINGS) IMPLANT
BLADE HEX COATED 2.75 (ELECTRODE) ×2 IMPLANT
BLADE SURG SZ10 CARB STEEL (BLADE) ×4 IMPLANT
CANISTER SUCTION 2500CC (MISCELLANEOUS) ×2 IMPLANT
CHLORAPREP W/TINT 26ML (MISCELLANEOUS) ×2 IMPLANT
CLOTH BEACON ORANGE TIMEOUT ST (SAFETY) ×2 IMPLANT
DECANTER SPIKE VIAL GLASS SM (MISCELLANEOUS) IMPLANT
DRAPE LAPAROTOMY T 102X78X121 (DRAPES) IMPLANT
DRAPE LAPAROTOMY TRNSV 102X78 (DRAPE) IMPLANT
DRAPE LG THREE QUARTER DISP (DRAPES) IMPLANT
ELECT REM PT RETURN 9FT ADLT (ELECTROSURGICAL) ×2
ELECTRODE REM PT RTRN 9FT ADLT (ELECTROSURGICAL) ×1 IMPLANT
EVACUATOR SILICONE 100CC (DRAIN) IMPLANT
GLOVE BIOGEL PI IND STRL 7.0 (GLOVE) ×1 IMPLANT
GLOVE BIOGEL PI INDICATOR 7.0 (GLOVE) ×1
GLOVE EUDERMIC 7 POWDERFREE (GLOVE) ×2 IMPLANT
GOWN STRL NON-REIN LRG LVL3 (GOWN DISPOSABLE) ×1 IMPLANT
GOWN STRL REIN XL XLG (GOWN DISPOSABLE) ×4 IMPLANT
KIT BASIN OR (CUSTOM PROCEDURE TRAY) ×2 IMPLANT
MARKER SKIN DUAL TIP RULER LAB (MISCELLANEOUS) IMPLANT
NDL HYPO 25X1 1.5 SAFETY (NEEDLE) ×1 IMPLANT
NEEDLE HYPO 25X1 1.5 SAFETY (NEEDLE) ×2 IMPLANT
NS IRRIG 1000ML POUR BTL (IV SOLUTION) ×2 IMPLANT
PACK BASIC VI WITH GOWN DISP (CUSTOM PROCEDURE TRAY) ×2 IMPLANT
PENCIL BUTTON HOLSTER BLD 10FT (ELECTRODE) ×2 IMPLANT
SOL PREP POV-IOD 16OZ 10% (MISCELLANEOUS) ×2 IMPLANT
SPONGE GAUZE 4X4 12PLY (GAUZE/BANDAGES/DRESSINGS) ×2 IMPLANT
SPONGE LAP 4X18 X RAY DECT (DISPOSABLE) ×2 IMPLANT
STAPLER VISISTAT 35W (STAPLE) IMPLANT
SUT MNCRL AB 4-0 PS2 18 (SUTURE) IMPLANT
SUT VIC AB 3-0 SH 18 (SUTURE) IMPLANT
SYR CONTROL 10ML LL (SYRINGE) ×2 IMPLANT
TOWEL OR 17X26 10 PK STRL BLUE (TOWEL DISPOSABLE) ×2 IMPLANT
WATER STERILE IRR 1500ML POUR (IV SOLUTION) ×2 IMPLANT
YANKAUER SUCT BULB TIP 10FT TU (MISCELLANEOUS) ×1 IMPLANT

## 2013-05-15 NOTE — Interval H&P Note (Signed)
History and Physical Interval Note:  05/15/2013 9:38 AM  Debbie Larsen  has presented today for surgery, with the diagnosis of right breast cancer  The various methods of treatment have been discussed with the patient and family. After consideration of risks, benefits and other options for treatment, the patient has consented to  Procedure(s): RIGHT AXILLARY LYMPH NODE DISSECTION (Right) as a surgical intervention .  The patient's history has been reviewed, patient examined, no change in status, stable for surgery.  I have reviewed the patient's chart and labs.  Questions were answered to the patient's satisfaction.   The right axilla is marked as the operative side.    Shanin Szymanowski J

## 2013-05-15 NOTE — Op Note (Signed)
Debbie Larsen [facility] [birth year] [id number] 05/06/2013  Preoperative diagnosis: current metastatic breast cancer to right axillary lymph nodes, status post chemotherapy  Postoperative diagnosis: same  Procedure: right axillary lymph node dissection  Surgeon: [name], MD, FACS  Anesthesia: General   Clinical History and Indications: this patient underwent a right lumpectomy and sentinel lymph node evaluation about 10 years ago. She recently presented with right axillary metastatic breast cancer. She is undergoing chemotherapy. Because of ongoing presence of axillary disease and symptoms including pain she elected at this time to have an axillary dissection.    Description of Procedure: I saw the patient in the preoperative area and confirmed the plans. Right axillary area was marked as the operative site. The patient was taken to the operating room in satisfactory general anesthesia obtained.the right axillary area and breast were prepped and draped. A timeout was done.  I made a transverse  axillary incision utilizing the prior scar from her sentinel node.I extended it anteriorly to the pectoralis muscle and posteriorly to the latissimus. I raised the small skin flap inferiorly and then divided the tissue down to the chest wall. Identified the latissimus and divided after tissue from the anterior to the latissimus going superiorly for several centimeters. I could bluntly dissected the axillary contents off of the lateral chest wall.I then opened the clavipectoral fascia and freed the x-ray contents from the border of the pectoralis muscle. I had to divide the blood supply coming in to the pectoralis with clips freed from the axillary contents. As I work superiorly I used blunt dissection and identified the axillary vein. I then began to remove gastric contents from medial to lateral and superior to inferior. The long thoracic nerve was readily identified and there was no inflammatory or tumor  in area. However, as I worked more laterally I saw the nerve and blood supply to the latissimus and is tethered somewhat by a hard lymph node obvious metastatic disease. I then freed the remaining gastric contents off of the asked to remain working more laterally and then freed up the x-ray contents from the remainder of the anterior border of the latissimus. In a stable dissected contents off of the vessels and nerve to the latissimus preserving them. This completed the axillary dissection. There is no obvious residual tumor. I can palpate fairly high medially and felt no metastatic disease. Next remain was intact I was able to palpate above this both superficially and deep and can feel no metastatic disease and there was no metastatic disease laterally either.  I irrigated and made sure everything was dry. I infiltrated 30 cc of 0.25% plain Marcaine for postop pain control. A 19 Blake drain was placed and secured with a 2-0 nylon. A final irrigation and checked for hemostasis was made. The incision was then closed with 3-0 Vicryl and 4-0 Monocryl subcuticular plus Dermabond. The patient are the procedure well. There were no complications. Counts were correct. Blood loss was minimal. [name], MD, FACS 05/15/2013 11:09 AM

## 2013-05-15 NOTE — Progress Notes (Signed)
Dr Jamey Ripa notified  Pt refused femera. States she no longer takes this med.

## 2013-05-15 NOTE — Anesthesia Postprocedure Evaluation (Signed)
  Anesthesia Post-op Note  Patient: Debbie Larsen  Procedure(s) Performed: Procedure(s) (LRB): RIGHT AXILLARY LYMPH NODE DISSECTION (Right)  Patient Location: PACU  Anesthesia Type: General  Level of Consciousness: awake and alert   Airway and Oxygen Therapy: Patient Spontanous Breathing  Post-op Pain: mild  Post-op Assessment: Post-op Vital signs reviewed, Patient's Cardiovascular Status Stable, Respiratory Function Stable, Patent Airway and No signs of Nausea or vomiting  Last Vitals:  Filed Vitals:   05/15/13 1115  BP: 135/63  Pulse: 81  Temp: 35.8 C  Resp: 14    Post-op Vital Signs: stable   Complications: No apparent anesthesia complications

## 2013-05-15 NOTE — H&P (View-Only) (Signed)
Debbie Larsen [facility]       DOB: [birth year]           DATE: 05/02/2013       AVW:[id number]  CC:  Chief Complaint  Patient presents with  . Breast Cancer    Post chemo    HPI: this patient presented several months ago with right axillary lymphadenopathy which on biopsy proved to be recurrent right breast cancer. She also has distant disease. She's had about 6 months of chemotherapy. There's been little change in the axilla. She is having pain in the axillary area and down her arm. Her oncologist sent her back to consider an axillary dissection at this time.she believes she is otherwise stable health. There've been no other medical problems.  Past history family history etc. Are noted in electronic medical record placed into this note.  EXAM: VITAL SIGNS:  BP 118/70  Pulse 82  Resp 18  Ht 5' (1.524 m)  Wt 116 lb (52.617 kg)  BMI 22.65 kg/m2  GENERAL:  The patient is alert, oriented, and generally healthy-appearing, NAD. Mood and affect are normal.  HEENT:  The head is normocephalic, the eyes nonicteric, the pupils were round regular and equal. EOMs are normal. Pharynx normal. Dentition good.  NECK:  The neck is supple and there are no masses or thyromegaly.  LUNGS: Normal respirations and clear to auscultation.  HEART: Regular rhythm, with no murmurs rubs or gallops. Pulses are intact carotid dorsalis pedis and posterior tibial. No significant varicosities are noted.  BREASTS:  there is no masses in the breasts. They're both well healed.  Lymphatics: There is fixed axillary adenopathy the right side quite hard, smaller than I remember (although larger by ultrasound)  ABDOMEN: Soft, flat, and nontender. No masses or organomegaly is noted. No hernias are noted. Bowel sounds are normal.  EXTREMITIES:  Good range of motion, no edema.  IMP: recurrent breast cancer with fixed nodes in the right axilla  PLAN: right axillary dissection. I had a lengthy discussion with the  patient about the surgery. We went over in detail the surgery, potential complications such as lymphedema bleeding infection and neurologic injury. I've also told her that I don't think the surgery will effect her long-term survival. She said she did having some surgery done on her breast and she is no evidence of recurrent breast cancer in the breast. In addition, she continues to have distant disease. I doubt that she has a complete understanding of the benefit of the axillary dissection but is concerned because she is having painand is hopeful that this will improve the symptoms. I told her that she may have yet recurrent disease in her axilla despite surgery and we are just trying to debulk the tumor to give her some improvement in symptoms. After a lengthy discussion I think she does understand this. She is anxious to go ahead and schedule surgery.I spent about 30 minutes in counseling, 45 total  Debbie Larsen J 05/02/2013

## 2013-05-15 NOTE — Transfer of Care (Signed)
Immediate Anesthesia Transfer of Care Note  Patient: Debbie Larsen  Procedure(s) Performed: Procedure(s) (LRB): RIGHT AXILLARY LYMPH NODE DISSECTION (Right)  Patient Location: PACU  Anesthesia Type: General  Level of Consciousness: sedated, patient cooperative and responds to stimulaton  Airway & Oxygen Therapy: Patient Spontanous Breathing and Patient connected to face mask oxgen  Post-op Assessment: Report given to PACU RN and Post -op Vital signs reviewed and stable  Post vital signs: Reviewed and stable  Complications: No apparent anesthesia complications

## 2013-05-15 NOTE — Anesthesia Preprocedure Evaluation (Signed)
Anesthesia Evaluation  Patient identified by MRN, date of birth, ID band Patient awake  General Assessment Comment: Metastasis to bone 03/13/13 PET scan results   Reviewed: Allergy & Precautions, H&P , NPO status , Patient's Chart, lab work & pertinent test results  Airway Mallampati: II TM Distance: >3 FB Neck ROM: Full    Dental no notable dental hx.    Pulmonary neg pulmonary ROS,  breath sounds clear to auscultation  Pulmonary exam normal       Cardiovascular negative cardio ROS  Rhythm:Regular Rate:Normal     Neuro/Psych negative neurological ROS  negative psych ROS   GI/Hepatic negative GI ROS, Neg liver ROS,   Endo/Other  Hypothyroidism   Renal/GU negative Renal ROS  negative genitourinary   Musculoskeletal negative musculoskeletal ROS (+)   Abdominal   Peds negative pediatric ROS (+)  Hematology negative hematology ROS (+)   Anesthesia Other Findings   Reproductive/Obstetrics negative OB ROS                           Anesthesia Physical Anesthesia Plan  ASA: III  Anesthesia Plan: General   Post-op Pain Management:    Induction: Intravenous  Airway Management Planned: LMA  Additional Equipment:   Intra-op Plan:   Post-operative Plan:   Informed Consent: I have reviewed the patients History and Physical, chart, labs and discussed the procedure including the risks, benefits and alternatives for the proposed anesthesia with the patient or authorized representative who has indicated his/her understanding and acceptance.   Dental advisory given  Plan Discussed with: CRNA and Surgeon  Anesthesia Plan Comments:         Anesthesia Quick Evaluation

## 2013-05-15 NOTE — Progress Notes (Signed)
Pt strongly refuses to wear red socks  Advised this is part of our fall prevention

## 2013-05-16 ENCOUNTER — Other Ambulatory Visit (INDEPENDENT_AMBULATORY_CARE_PROVIDER_SITE_OTHER): Payer: Self-pay | Admitting: Surgery

## 2013-05-16 ENCOUNTER — Other Ambulatory Visit: Payer: Medicaid Other | Admitting: Lab

## 2013-05-16 ENCOUNTER — Ambulatory Visit: Payer: Medicaid Other

## 2013-05-16 ENCOUNTER — Ambulatory Visit: Payer: Medicaid Other | Admitting: Oncology

## 2013-05-16 ENCOUNTER — Encounter: Payer: Self-pay | Admitting: *Deleted

## 2013-05-16 MED ORDER — OXYCODONE-ACETAMINOPHEN 5-325 MG PO TABS: 1.0000 | ORAL_TABLET | ORAL | Status: DC | PRN

## 2013-05-16 MED ORDER — ZOLPIDEM TARTRATE ER 6.25 MG PO TBCR: 6.2500 mg | EXTENDED_RELEASE_TABLET | Freq: Every evening | ORAL | Status: DC | PRN

## 2013-05-16 NOTE — Progress Notes (Signed)
1 Day Post-Op   Assessment: s/p Procedure(s): RIGHT AXILLARY LYMPH NODE DISSECTION Patient Active Problem List   Diagnosis Date Noted  . BREAST CANCER, right, IDC, Stage II, recepto + 12/28/2006    Priority: High  . Breast cancer metastasized to axillary lymph node 05/15/2013  . Metastasis to bone   . Anemia 03/21/2013  . Generalized anxiety disorder 12/25/2012  . Breast carcinoma metastatic, lung 11/15/2012  . Osteopenia 08/03/2012  . Vitamin D insufficiency 06/28/2012  . Vertigo following CVA (cerebrovascular accident) 05/16/2012  . Stroke 05/11/2012  . Shoulder pain, right 03/01/2012  . POSTMENOPAUSAL STATUS 08/17/2010  . HYPOTHYROIDISM, UNSPECIFIED 12/28/2006  . Prediabetes 12/28/2006    Stable post op and able to go home  Plan: Discharge  Subjective: Mild incisional pain, otherwise OK  Objective: Vital signs in last 24 hours: Temp:  [96.5 F (35.8 C)-98.7 F (37.1 C)] 97.7 F (36.5 C) (07/17 0536) Pulse Rate:  [55-84] 70 (07/17 0536) Resp:  [9-18] 16 (07/17 0536) BP: (95-135)/(55-88) 112/59 mmHg (07/17 0536) SpO2:  [97 %-100 %] 100 % (07/17 0536) Weight:  [116 lb (52.617 kg)] 116 lb (52.617 kg) (07/16 1900)   Intake/Output from previous day: 07/16 0701 - 07/17 0700 In: 1540 [P.O.:840; I.V.:600; IV Piggyback:100] Out: 2780 [Urine:2750; Drains:30]  General appearance: alert, cooperative and no distress Resp: clear to auscultation bilaterally Cardio: regular rate and rhythm, S1, S2 normal, no murmur, click, rub or gallop  Incision: Looks OK, JP thin  Lab Results:  No results found for this basename: WBC, HGB, HCT, PLT,  in the last 72 hours BMET No results found for this basename: NA, K, CL, CO2, GLUCOSE, BUN, CREATININE, CALCIUM,  in the last 72 hours  MEDS, Scheduled . heparin  5,000 Units Subcutaneous Q8H  . letrozole  2.5 mg Oral Daily  . levothyroxine  75 mcg Oral QHS    Studies/Results: No results found.    LOS: 1 day     [name], MD, [facility], [place] [phone]   05/16/2013 7:28 AM

## 2013-05-16 NOTE — Progress Notes (Signed)
CSW consulted to assist pt with community resources. Pt requested assistance contacting cancer center CSW. Message left for Lauren Mullis  LCSW to contact pt.   Cori Razor LCSW [phone]

## 2013-05-16 NOTE — Progress Notes (Signed)
PT PERFORMED A RETURN DEMONSTRATION SHOWING HOW SHE KNEW HOW TO CORRECTLY EMPTY HER JP DRAIN.  I INSTRUCTED HER ON HOW TO RECORD HER OUTPUT DATE, AMOUNT AND DESCRIPTION OF JP DRAINAGE.

## 2013-05-16 NOTE — Plan of Care (Signed)
Problem: Discharge Progression Outcomes Goal: Tubes and drains discontinued if indicated Outcome: Not Met (add Reason) DISCHARGED HOME WITH JP DRAIN

## 2013-05-16 NOTE — Progress Notes (Signed)
Cancer Target ID order sent to pathology.  Spoke to [place].  Order received.  Will notify pt that test will be sent and results should be back in 7-10 days.

## 2013-05-17 ENCOUNTER — Telehealth (INDEPENDENT_AMBULATORY_CARE_PROVIDER_SITE_OTHER): Payer: Self-pay

## 2013-05-17 ENCOUNTER — Encounter (HOSPITAL_COMMUNITY): Payer: Self-pay | Admitting: Surgery

## 2013-05-17 NOTE — Discharge Summary (Signed)
Patient ID: Debbie Larsen [id number] 62 y.o. Apr 18, 1951  Admission  Date: 05/15/2013  Discharge date and time: 05/16/2013  4:00 PM  Admitting Physician: [name]  Discharge Physician: [name]  Admission Diagnoses: right breast cancer  Discharge Diagnoses: Axillary recurrence breast cancer  Operations: Procedure(s): RIGHT AXILLARY LYMPH NODE DISSECTION  Discharged Condition: good  Hospital Course: She was kept overnight following right axillary LN dissection, did well and able to go home the next day.  Consults: None  Significant Diagnostic Studies: Diagnosis Lymph nodes, regional resection, right axillary content from breast - METASTATIC HIGH GRADE CARCINOMA, SEE COMMENT. Microscopic Comment The history of invasive mammary carcinoma ([id number]) and recent axillary biopsy demonstrate an invasive carcinoma ([id number]) are both noted. The current specimen demonstrates five nodular portions of fibrovascular and adipose axillary soft tissue in which there are well demarcated areas of high grade carcinoma. Although there are no definitive lymph node capsules in any of the nodules, some of the nodules demonstrate peripheral lymphoid inflammation. Differential diagnosis is metastatic soft tissue tumor deposits with associated lymphoid inflammation and total nodal replacement with extra capsular extension by metastatic carcinoma. Breast prognostic profile was performed on the most recent biopsy ([id number]). Estrogen receptor demonstrates 0% positivity; progesterone receptor demonstrates 0%; Ki-67 proliferation rate was 59%, and there was no amplification of Her 2 (1.43). Breast prognostic profile will not be repeated unless clinically indicated. (CRR:gt, 05/16/13) [place] RUND DO Pathologist, Electronic Signature (Case signed 05/16/2013)   Disposition: Home

## 2013-05-17 NOTE — Telephone Encounter (Signed)
I was told that the path was sent as requested. Don't know of any way to tell. I told her that two or three times.

## 2013-05-17 NOTE — Telephone Encounter (Signed)
Pt calling back b/c is very concerned about her path report being sent to [facility] for a clinical study. I advised pt that I was not sure about the path being sent out but I would check with [facility] b/c I did see a note in the system about her path. I lmom for Dawn to call me back. The pt is wanting a copy of her path report. I advised pt that she will get a copy of her path when she comes back to the f/u appt with Dr Jamey Ripa on 8/6. The pt is asking about getting her drain wet in the shower and I advised she should not get the drain wet. The pt stated that homehealth told her she could wrap it up seran wrap to take a shower and I said as long as you don't get the drain wet. The pt is concerned about running a fever and I asked if she checked her temp. She said it was 12 and I said it was not uncommon to have a low grade after surgery. We become concerned when the temp is over 101 so she should call our office if the temp does get to 101. I asked if the pt is drinking plenty of liquids and she is drinking. The pt is eating and taking her pain medicine. The pt asked to take Tylenol for her temp.along with the Percocet. I advised pt that she should not take Tylenol along with Percocet. I advised pt that she could take Ibuprofen along with Percocet. The pt asked how she should take her Percocet and I advised her to take Percocet 1 tab every 4-6 hrs for pain. The pt wanted to know if triage was her over the weekend and I advised no but we have physicians on call over the weekend. I advised pt that if she starts running a fever over 101, redness, drainage with odor, severe n&v,or swelling then the pt would call our office who then would call the answering service then they would page the doctor on call. The pt was then upset about the discharge instructions not being easy to read so I explained that she should call the hospital to let them know that the instructions were hard to read. I advised the pt that if she needs  anything else to call our office. The pt understands.

## 2013-05-17 NOTE — Telephone Encounter (Signed)
Pt calling Darl Pikes at [facility] b/c she is concerned that no one from our office has called pt to check on her after surgery and go over her instructions about after surgery. Pls call pt to check on her.

## 2013-05-20 ENCOUNTER — Other Ambulatory Visit: Payer: Medicaid Other

## 2013-05-20 ENCOUNTER — Telehealth: Payer: Self-pay | Admitting: *Deleted

## 2013-05-20 NOTE — Telephone Encounter (Signed)
Per staff message and POF I have scheduled appts.  JMW  

## 2013-05-20 NOTE — Telephone Encounter (Signed)
I spoke to [name] about sending out her specimen to [facility].  Informed her we should have the results within 2wks.  Received verbal understanding.  Scheduled pt with Dr. Darnelle Catalan on 06/07/13 to discuss results.  R/S zometa infusion and labs to 06/07/13 as well.  Pt to arrive at 1030 for labs.  Gave pt these instructions.  Pt confirmed new date and time of appts.  Pt denied further needs at this time.  :Encourage pt to call with needs.  Gave pt contact information.  Pt relate she is having a "little bit of pain" but it is being controlled "with pain pill".

## 2013-05-22 ENCOUNTER — Encounter: Payer: Self-pay | Admitting: Radiation Oncology

## 2013-05-27 ENCOUNTER — Other Ambulatory Visit (HOSPITAL_COMMUNITY): Payer: Self-pay | Admitting: Oncology

## 2013-05-27 ENCOUNTER — Encounter (INDEPENDENT_AMBULATORY_CARE_PROVIDER_SITE_OTHER): Payer: Self-pay

## 2013-05-27 ENCOUNTER — Telehealth (INDEPENDENT_AMBULATORY_CARE_PROVIDER_SITE_OTHER): Payer: Self-pay | Admitting: *Deleted

## 2013-05-27 ENCOUNTER — Ambulatory Visit (INDEPENDENT_AMBULATORY_CARE_PROVIDER_SITE_OTHER): Payer: Medicaid Other

## 2013-05-27 VITALS — BP 126/82 | HR 68 | Temp 97.0°F | Resp 18

## 2013-05-27 DIAGNOSIS — C50919 Malignant neoplasm of unspecified site of unspecified female breast: Secondary | ICD-10-CM

## 2013-05-27 NOTE — Progress Notes (Signed)
Patient in for nurse only drain removal; J.P drain 10 cc - 12 cc per day x 3 days  per patient; light colored fluid in drain noted; cleansed area with cholera prep; removed one suture ; removed drain; cleansed area with cholera prep ; applied 2x2 dressing; secured with tape; advised patient to call if temp 100.3 or greater, redness, odor, serous drainage. Patient verbalized understanding

## 2013-05-27 NOTE — Telephone Encounter (Signed)
Patient called to report that something messed up with her drain.  Due to the language barrier it was very difficult for this RN to understand what patient was trying to explain.  Finally this RN was able to have patient state that the drain tube is still inside her and the other end is connected to a bulb.  This RN suggested to patient that she come into the office this afternoon to make sure the drain is ok and everything is still intact.  Patient was able to call and find a ride who will hopefully have patient here before 5p today to be seen.  Encouraged patient to get here as fast as she can to ensure we are able to see her and the office is still open.  Patient states she is waiting on her ride then will be on her way.

## 2013-05-28 ENCOUNTER — Encounter (INDEPENDENT_AMBULATORY_CARE_PROVIDER_SITE_OTHER): Payer: Medicaid Other

## 2013-05-28 ENCOUNTER — Ambulatory Visit: Payer: Medicaid Other

## 2013-05-28 ENCOUNTER — Ambulatory Visit: Payer: Medicaid Other | Admitting: Radiation Oncology

## 2013-06-05 ENCOUNTER — Ambulatory Visit (INDEPENDENT_AMBULATORY_CARE_PROVIDER_SITE_OTHER): Payer: Medicaid Other | Admitting: Surgery

## 2013-06-05 ENCOUNTER — Encounter (INDEPENDENT_AMBULATORY_CARE_PROVIDER_SITE_OTHER): Payer: Self-pay | Admitting: Surgery

## 2013-06-05 VITALS — BP 102/62 | HR 72 | Temp 97.9°F | Resp 15 | Ht 61.0 in | Wt 117.6 lb

## 2013-06-05 DIAGNOSIS — Z09 Encounter for follow-up examination after completed treatment for conditions other than malignant neoplasm: Secondary | ICD-10-CM

## 2013-06-05 NOTE — Patient Instructions (Signed)
See me in a month. Plan to go to the ABC class:      ABC CLASS After Breast Cancer Class  After Breast Cancer Class is a specially designed exercise class to assist you in a safe recovery after having breast cancer surgery.  In this class you will learn how to get back to full function whether your drains were just removed or if you had surgery a month ago.  This one-time class is held the 1st and 3rd Monday of every month from 11:00 a.m. until 12:00 noon at the [facility] located at [facility].  This class is FREE and space is limited.  For more information or to register for the next available class, call [phone].  Class Goals   Understand specific stretches to improve the flexibility of your chest and shoulder.   Learn ways to safely strengthen your upper body and improve your posture.   Understand the warning signs of infection and why you may be at risk for an arm infection.   Learn about Lymphedema and prevention.  **You do not attend this class until after surgery.  Drains must be removed to participate.     Micheline Maze, PT, CLT Dwaine Gale, PT, CLT

## 2013-06-05 NOTE — Progress Notes (Signed)
Debbie Larsen [facility]                                            DOB: 08/04/1951 DATE: 06/05/2013                                                  MRN: [id number]  CC:  Chief Complaint  Patient presents with  . Routine Post Op    reck rt breast    HPI: This patient comes in for post op follow-up .Sheunderwent a right axillary dissection on 05/15/13. She feels that she is doing well.Her drain was removed  PE:  VITAL SIGNS: BP 102/62  Pulse 72  Temp(Src) 97.9 F (36.6 C) (Temporal)  Resp 15  Ht 5\' 1"  (1.549 m)  Wt 117 lb 9.6 oz (53.343 kg)  BMI 22.23 kg/m2  General: The patient appears to be healthy, NAD Incison healing nicely; somewhat limited ROM right shoulder  DATA REVIEWED: Path noted  IMPRESSION: The patient is doing well S/P axillary dissection for recurrent breast cancer.    PLAN: RTC 1 month I gave the patient a copy of the pathology report and reviewed it with her ABC class

## 2013-06-07 ENCOUNTER — Other Ambulatory Visit: Payer: Self-pay | Admitting: Oncology

## 2013-06-07 ENCOUNTER — Ambulatory Visit (HOSPITAL_BASED_OUTPATIENT_CLINIC_OR_DEPARTMENT_OTHER): Payer: Medicaid Other

## 2013-06-07 ENCOUNTER — Other Ambulatory Visit (HOSPITAL_BASED_OUTPATIENT_CLINIC_OR_DEPARTMENT_OTHER): Payer: Medicaid Other | Admitting: Lab

## 2013-06-07 ENCOUNTER — Ambulatory Visit (HOSPITAL_BASED_OUTPATIENT_CLINIC_OR_DEPARTMENT_OTHER): Payer: Medicaid Other | Admitting: Oncology

## 2013-06-07 VITALS — BP 130/75 | HR 73 | Temp 98.9°F | Resp 20 | Ht 61.0 in | Wt 118.4 lb

## 2013-06-07 DIAGNOSIS — C7800 Secondary malignant neoplasm of unspecified lung: Secondary | ICD-10-CM

## 2013-06-07 DIAGNOSIS — C50919 Malignant neoplasm of unspecified site of unspecified female breast: Secondary | ICD-10-CM

## 2013-06-07 DIAGNOSIS — C7951 Secondary malignant neoplasm of bone: Secondary | ICD-10-CM

## 2013-06-07 DIAGNOSIS — C773 Secondary and unspecified malignant neoplasm of axilla and upper limb lymph nodes: Secondary | ICD-10-CM

## 2013-06-07 DIAGNOSIS — C7952 Secondary malignant neoplasm of bone marrow: Secondary | ICD-10-CM

## 2013-06-07 DIAGNOSIS — M8580 Other specified disorders of bone density and structure, unspecified site: Secondary | ICD-10-CM

## 2013-06-07 LAB — COMPREHENSIVE METABOLIC PANEL (CC13)
Albumin: 3.6 g/dL (ref 3.5–5.0)
Alkaline Phosphatase: 89 U/L (ref 40–150)
BUN: 11.2 mg/dL (ref 7.0–26.0)
CO2: 22 mEq/L (ref 22–29)
Calcium: 9.5 mg/dL (ref 8.4–10.4)
Chloride: 107 mEq/L (ref 98–109)
Glucose: 106 mg/dl (ref 70–140)
Potassium: 4 mEq/L (ref 3.5–5.1)
Sodium: 140 mEq/L (ref 136–145)
Total Protein: 7.5 g/dL (ref 6.4–8.3)

## 2013-06-07 LAB — CBC WITH DIFFERENTIAL/PLATELET
Basophils Absolute: 0 10*3/uL (ref 0.0–0.1)
Eosinophils Absolute: 0.1 10*3/uL (ref 0.0–0.5)
HGB: 11.9 g/dL (ref 11.6–15.9)
MONO#: 0.7 10*3/uL (ref 0.1–0.9)
NEUT#: 3.2 10*3/uL (ref 1.5–6.5)
RBC: 4.38 10*6/uL (ref 3.70–5.45)
RDW: 15.1 % — ABNORMAL HIGH (ref 11.2–14.5)
WBC: 6.3 10*3/uL (ref 3.9–10.3)
lymph#: 2.2 10*3/uL (ref 0.9–3.3)

## 2013-06-07 MED ORDER — ZOLEDRONIC ACID 4 MG/100ML IV SOLN
4.0000 mg | Freq: Once | INTRAVENOUS | Status: AC
  Administered 2013-06-07: 4 mg via INTRAVENOUS
  Filled 2013-06-07: qty 100

## 2013-06-07 MED ORDER — SODIUM CHLORIDE 0.9 % IV SOLN
INTRAVENOUS | Status: DC
  Administered 2013-06-07: 13:00:00 via INTRAVENOUS

## 2013-06-07 MED ORDER — HEPARIN SOD (PORK) LOCK FLUSH 100 UNIT/ML IV SOLN
500.0000 [IU] | Freq: Once | INTRAVENOUS | Status: AC
  Administered 2013-06-07: 500 [IU] via INTRAVENOUS
  Filled 2013-06-07: qty 5

## 2013-06-07 MED ORDER — SODIUM CHLORIDE 0.9 % IJ SOLN
10.0000 mL | INTRAMUSCULAR | Status: DC | PRN
  Administered 2013-06-07: 10 mL via INTRAVENOUS
  Filled 2013-06-07: qty 10

## 2013-06-07 NOTE — Patient Instructions (Addendum)
[facility] Discharge Instructions for Patients Receiving Chemotherapy  Today you received the following chemotherapy agents: Zometa  To help prevent nausea and vomiting after your treatment, we encourage you to take your nausea medication as directed by your physician.    If you develop nausea and vomiting that is not controlled by your nausea medication, call the clinic.   BELOW ARE SYMPTOMS THAT SHOULD BE REPORTED IMMEDIATELY:  *FEVER GREATER THAN 100.5 F  *CHILLS WITH OR WITHOUT FEVER  NAUSEA AND VOMITING THAT IS NOT CONTROLLED WITH YOUR NAUSEA MEDICATION  *UNUSUAL SHORTNESS OF BREATH  *UNUSUAL BRUISING OR BLEEDING  TENDERNESS IN MOUTH AND THROAT WITH OR WITHOUT PRESENCE OF ULCERS  *URINARY PROBLEMS  *BOWEL PROBLEMS  UNUSUAL RASH Items with * indicate a potential emergency and should be followed up as soon as possible.  Feel free to call the clinic you have any questions or concerns. The clinic phone number is [phone].

## 2013-06-07 NOTE — Progress Notes (Signed)
ID: Debbie Larsen   DOB: 13-Jan-1951  MR#: [id number]  CSN#:[id number]  PCP: Debbie Pickle, MD SU: Debbie Paris, MD OTHER MD: Debbie Larsen, Debbie Larsen, Debbie Larsen   HISTORY OF PRESENT ILLNESS: The patient's 2002-2004 records have been retrieved. The sugar the patient underwent a right breast core biopsy 03/29/2001 for an invasive ductal carcinoma associated with necrosis ([id number]). 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 The tumor was estrogen receptor 95% positive, progesterone receptor 96% positive, with an MIB-1 of 11%, and no HER-2 amplification by the HercepTest. She completed 6 cycles of CMF chemotherapy on 12/12/2001. There were minor delays, but no dose reductions. She received radiation to the right breast and regional lymph nodes to a total of 5040 cGy with a right breast boost of 12 Debbie Larsen, completed 02/12/2002  More recently, "Debbie Larsen" develop some right shoulder pain sometime in late 2012. She had followup mammograms of until 2011. There was no mammogram performed in 2012. She was seen by physical therapy in orthopedic surgery for evaluation of her right shoulder pain. She had a mammogram on 07/24/2012 which showed a suspicious mass behind eczema of the right breast. Physical exam the time did show multiple involved right axillary lymph nodes. 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 HER-2 nonamplified the ratio by C[facility] being 1.43. Proliferative index was  50%.   MRI scan of both breasts performed on 08/31/2012 showed multiple involved lymph nodes in the right axilla, no obvious masses in either breast. Unfortunately staging PET scan 09/12/2012 showed in addition to her right axillary recurrence, bilateral mediastinal and left  lung nodules consistent with stage IV disease. Her subsequent history is as detailed below.  INTERVAL HISTORY: Debbie Larsen returns today for followup of her metastatic breast cancer. Since her last visit here she underwent right axillary lymph node dissection and the material was sent for sequencing. 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  REVIEW OF SYSTEMS: Debbie Larsen tells me that her right arm feels a lot better and her hand feels a lot stronger since she had the axillary dissection. She did well with the surgery, without unusual pain, fever, bleeding, or other complications. She still has joint pain here and there but it is not more frequent or intense than prior. She does not take narcotics for this. A detailed review of systems today was otherwise noncontributory  PAST MEDICAL HISTORY: Past Medical History  Diagnosis Date  . 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 Hypothyroid   . Enlarged lymph nodes   . BREAST CANCER 04/2001    s/p radiation therapy, chemotherapy and lumpectomy  . 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  . S/P radiation therapy     Right breast/regional lymph nodes/ 5040 cGy / 28 fractions/ bost of 1200 cGy/ 6 Fractions  . Status post chemotherapy     6 cycles of CMF completed on 12/12/2001  . Right shoulder pain     started late 2012  . Dry cough     intermittent  . SOB (shortness of breath)   . Use of letrozole (Femara) 03/14/2013  . Metastasis to bone 03/13/13    PET scan results  . Metastasis  to bone     breast primary    PAST SURGICAL HISTORY: Past Surgical History  Procedure Laterality Date  . Tonsillectomy    . Breast lumpectomy  04/2001    excision of L breast fibroadenoma by Dr.   . Breast lumpectomy  05/16/2001    Right axillary sentinel lymph node biopsy.  . Portacath placement  09/19/2012    Procedure: INSERTION PORT-A-CATH;  Surgeon: Debbie Paris, MD;  Location: [facility] OR;  Service: General;  Laterality: N/A;  . Lipoma excision Right 05/15/2013    Procedure: RIGHT AXILLARY LYMPH NODE DISSECTION;  Surgeon: Debbie Paris, MD;  Location: [facility] ORS;  Service: General;  Laterality: Right;    FAMILY HISTORY Family History  Problem Relation Age of Onset  . Diabetes Mother   . Hypertension Father   . Hypertension Sister   . Hypothyroidism Brother   . Cancer Neg Hx   The patient's father died from heart disease at the age of 84. The patient's mother died Jul 11, 2012 at the age of 25 also from heart disease. The patient has 2 sisters and 2 brothers. There is no history of breast or ovarian cancer in the family to her knowledge.  GYNECOLOGIC HISTORY: G1 P1, first live birth age 73. She underwent menopause at the time of her chemotherapy in 2002. She did not use hormone replacement.  SOCIAL HISTORY: The patient is divorced and lives by herself. Her son Debbie Larsen lives in [place], where he works as an Publishing copy. The patient tells me her son lives with 3 roommates" there is no room for me there". (Accordingly going to [employer] for studies is not an option for her). The patient has no grandchildren. She attends the local [employer].   ADVANCED DIRECTIVES: The patient tells me (11/01/2012) her healthcare power of attorney is her sister Debbie Larsen. She was unable to give me her contact information   HEALTH MAINTENANCE: History  Substance Use Topics  . Smoking status: Never Smoker   . Smokeless tobacco: Never Used  . Alcohol Use: No     Colonoscopy: never  PAP: 08/17/2012  Bone density: never  Lipid panel: Not on file  No Known Allergies  Current Outpatient Prescriptions  Medication Sig Dispense Refill  . B Complex-C (B-COMPLEX WITH VITAMIN C) tablet Take 1 tablet by mouth daily.      . calcium-vitamin D (OSCAL WITH D) 500-200 MG-UNIT per tablet Take 1 tablet by mouth daily with breakfast.      .  levothyroxine (SYNTHROID, LEVOTHROID) 75 MCG tablet Take 75 mcg by mouth every evening.      . lidocaine-prilocaine (EMLA) cream Apply topically as needed. Apply to port 1-2 hours before procedure  30 g  0  . Multiple Vitamin (MULTIVITAMIN WITH MINERALS) TABS Take 1 tablet by mouth daily.       No current facility-administered medications for this visit.   Facility-Administered Medications Ordered in Other Visits  Medication Dose Route Frequency Provider Last Rate Last Dose  . heparin lock flush 100 unit/mL  500 Units Intravenous Once Lowella Dell, MD      . sodium chloride 0.9 % injection 10 mL  10 mL Intravenous PRN Lowella Dell, MD       Objective: Middle-aged [place] Asian woman who appears stated age 6 Vitals:   06/07/13 1123  BP: 130/75  Pulse: 73  Temp: 98.9 F (37.2 C)  Resp: 20   Filed Vitals:   06/07/13 1123  Height: 5\' 1"  (1.549 m)  Weight: 118 lb 6.4 oz (53.706 kg)    Sclerae unicteric, pupils equal round and reactive to light Oropharynx clear No cervical or supraclavicular adenopathy Lungs no rales or rhonchi, good excursion bilaterally Heart regular rate and rhythm Abd soft, nontender, positive bowel sounds MSK no focal spinal tenderness, no peripheral edema; Neuro: Nonfocal; well-oriented; pleasant affect Breasts: The right breast is status post lumpectomy and radiation. The right axilla is status post recent dissection. There is no palpable mass and the incision is healing nicely. There is no evidence of chest wall involvement. Left breast is unremarkable   LAB RESULTS: Lab Results  Component Value Date   WBC 6.3 06/07/2013   NEUTROABS 3.2 06/07/2013   HGB 11.9 06/07/2013   HCT 35.7 06/07/2013   MCV 81.4 06/07/2013   PLT 240 06/07/2013      Chemistry      Component Value Date/Time   NA 135* 04/25/2013 1316   NA 138 09/18/2012 1531   K 3.9 04/25/2013 1316   K 3.7 09/18/2012 1531   CL 106 03/21/2013 1422   CL 103 09/18/2012 1531   CO2 23 04/25/2013  1316   CO2 23 09/18/2012 1531   BUN 11.2 04/25/2013 1316   BUN 9 09/18/2012 1531   CREATININE 0.8 04/25/2013 1316   CREATININE 0.70 09/18/2012 1531   CREATININE 0.74 03/01/2012 1715      Component Value Date/Time   CALCIUM 9.3 04/25/2013 1316   CALCIUM 9.8 09/18/2012 1531   ALKPHOS 71 04/25/2013 1316   ALKPHOS 81 09/18/2012 1531   AST 21 04/25/2013 1316   AST 18 09/18/2012 1531   ALT 17 04/25/2013 1316   ALT 13 09/18/2012 1531   BILITOT 0.44 04/25/2013 1316   BILITOT 0.3 09/18/2012 1531       Lab Results  Component Value Date   LABCA2 27 12/27/2012     STUDIES: Patient: SHANQUITA, RONNING D Collected: 05/15/2013 Client:[facility] Accession: [id number] Received: 05/15/2013 Cyndia Bent DOB: 06/19/1951 Age: 87 Gender: F Reported: 05/16/2013 [address] Patient Ph: [phone] MRN #: [id number] [place], [place] [address] Visit #: [id number] Chart #: Phone: [phone] Fax: CC: REPORT OF SURGICAL PATHOLOGY ADDITIONAL INFORMATION: Please see Bio T-3 next generation sequencing report for molecular characterization of this metastatic carcinoma (Bio T report [id number]). (CRR:gt, 06/03/13) [place] RUND DO Pathologist, Electronic Signature ( Signed 06/03/2013) FINAL DIAGNOSIS Diagnosis Lymph nodes, regional resection, right axillary content from breast - METASTATIC HIGH GRADE CARCINOMA, SEE COMMENT. Microscopic Comment The history of invasive mammary carcinoma ([id number]) and recent axillary biopsy demonstrate an invasive carcinoma ([id number]) are both noted. The current specimen demonstrates five nodular portions of fibrovascular and adipose axillary soft tissue in which there are well demarcated areas of high grade carcinoma. Although there are no definitive lymph node capsules in any of the nodules, some of the nodules demonstrate peripheral lymphoid inflammation. Differential diagnosis is metastatic soft tissue tumor deposits with associated lymphoid inflammation and total  nodal replacement with extra capsular extension by metastatic carcinoma. Breast prognostic profile was performed on the most recent biopsy ([id number]). Estrogen receptor demonstrates 0% positivity; progesterone receptor demonstrates 0%; Ki-67 proliferation rate was 59%, and there was no amplification of Her 2 (1.43). Breast prognostic profile will not be repeated unless clinically indicated. (CRR:gt, 05/16/13) [place] RUND DO Pathologist, Electronic Signature  ASSESSMENT: 62 y.o. [place] woman originally from [place] with stage IV breast cancer:  (1) Status post right lumpectomy and sentinel lymph node sampling 05/16/2001 for a pT1c  pN1, stage IIA invasive ductal carcinoma, grade 2, estrogen receptor 95% and progesterone receptor 96% positive, with an MIB-1 of 11% and no HER-2 amplification by immunocytochemistry.  (2) Treated adjuvantly with cyclophosphamide, methotrexate and fluorouracil, likely for 8 cycles (data unavailable), completed February 2003.  (3) Completed radiation to the right breast and regional lymph nodes 02/12/2002.  (4) The patient apparently refused completion axillary lymph node dissection and antiestrogen therapy (data unavailable).  (5) Recurrent disease documented by right axillary lymph node biopsy 08/23/2012, showing a high-grade invasive ductal carcinoma which was estrogen and progesterone receptor negative, with an MIB-1 of 59%, and no HER-2 amplification; staging studies showed hyper metabolic mediastinal and left lung lesions, with no involvement of the liver or brain  (6) Abraxane every 2 weeks started 09/20/2012, with good tolerance and evidence of response, completing 12 doses 02/21/2013.  (7) restaging studies at [facility] March 2014 and here April-May 2014 as follows  (a) bone involvement: [facility] bone scan showed focal tracer activity in the left acetabulum, left 12th rib posteriorly, and right sixth rib posteriorly; on CT scan a 9 mm sclerotic sacral lesion was new  as compared to November of 2013.  (b) [facility] CT of the chest, abdomen and pelvis showed slight decrease in size of some mediastinal lymph nodes, persistent unchanged nodularity in the right axilla, normal liver, and no new or enlarging pulmonary nodules  (c) MRI of the cervical spine shows no obvious lesion to explain the patient's Right arm symptoms  (d) PET scan shows multiple bone lesions (including C7), mild growth in index lymph nodes, no change in possible left lung nodules, and no liver involvement  (7) genetic testing obtained at [facility] 03/19/2013, results pending  (8) zolendronate started 02/21/2013, continuing monthly   (9) nonspecific myositis RUE documented at [facility] by [facility] March 2014; cervical MRI DOES not explain symptoms, which are likely due to a right axillary radiculopathy  (10) status post right axillary lymph node dissection 05/15/2013, special studies showing the tumor to have 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 Debbie Larsen continues to be interested in participating in studies, and now that we know that she carries actionable mutations, that is more of a possibility. We are going to let Dr. Amie Critchley at [facility] know the results. If they don't have anything to offer Debbie Larsen would like to consider [facility] and if that doesn't work there is a study using the Akt inhibitor MK2206 which may be available in the region. I do not know if he would be able to start that study here in a reasonable period of time, but I will check.  Debbie Larsen is going to see me in 2 weeks to see how far we.on this study situation. If there are no good prospects we will probably go back to Abraxane. She knows to call for any problems that may develop before the next visit here.   Lowella Dell, MD  06/07/2013

## 2013-06-11 ENCOUNTER — Other Ambulatory Visit (INDEPENDENT_AMBULATORY_CARE_PROVIDER_SITE_OTHER): Payer: Self-pay | Admitting: Surgery

## 2013-06-11 DIAGNOSIS — C50911 Malignant neoplasm of unspecified site of right female breast: Secondary | ICD-10-CM

## 2013-06-13 ENCOUNTER — Other Ambulatory Visit: Payer: Medicaid Other | Admitting: Lab

## 2013-06-13 ENCOUNTER — Ambulatory Visit: Payer: Medicaid Other

## 2013-06-16 ENCOUNTER — Other Ambulatory Visit: Payer: Self-pay | Admitting: Oncology

## 2013-06-18 ENCOUNTER — Telehealth: Payer: Self-pay | Admitting: Oncology

## 2013-06-20 ENCOUNTER — Telehealth: Payer: Self-pay | Admitting: Oncology

## 2013-06-20 NOTE — Telephone Encounter (Signed)
Pt appt. With Dr. Amie Critchley @ [facility] is 07/03/13@12 :30. Medical records faxed.  Left pt vm to return call

## 2013-06-24 ENCOUNTER — Ambulatory Visit (HOSPITAL_BASED_OUTPATIENT_CLINIC_OR_DEPARTMENT_OTHER): Payer: Medicaid Other | Admitting: Oncology

## 2013-06-24 VITALS — BP 123/73 | HR 87 | Temp 99.1°F | Resp 20 | Ht 61.0 in | Wt 119.8 lb

## 2013-06-24 DIAGNOSIS — C7951 Secondary malignant neoplasm of bone: Secondary | ICD-10-CM

## 2013-06-24 DIAGNOSIS — C50919 Malignant neoplasm of unspecified site of unspecified female breast: Secondary | ICD-10-CM

## 2013-06-24 DIAGNOSIS — C7800 Secondary malignant neoplasm of unspecified lung: Secondary | ICD-10-CM

## 2013-06-24 DIAGNOSIS — C773 Secondary and unspecified malignant neoplasm of axilla and upper limb lymph nodes: Secondary | ICD-10-CM

## 2013-06-24 DIAGNOSIS — C50219 Malignant neoplasm of upper-inner quadrant of unspecified female breast: Secondary | ICD-10-CM

## 2013-06-24 NOTE — Progress Notes (Signed)
ID: Debbie Larsen   DOB: 29-Mar-1951  MR#: [id number]  CSN#:[id number]  PCP: Rodman Pickle, MD SU: [name], MD OTHER MD: Nicholes Mango, [name], [employer]   HISTORY OF PRESENT ILLNESS: The patient's 2002-2004 records have been retrieved. The sugar the patient underwent a right breast core biopsy 03/29/2001 for an invasive ductal carcinoma associated with necrosis ([id number]). 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 The tumor was estrogen receptor 95% positive, progesterone receptor 96% positive, with an MIB-1 of 11%, and no HER-2 amplification by the HercepTest. She completed 6 cycles of CMF chemotherapy on 12/12/2001. There were minor delays, but no dose reductions. She received radiation to the right breast and regional lymph nodes to a total of 5040 cGy with a right breast boost of 12 [name], completed 02/12/2002  More recently, "Debbie Larsen" develop some right shoulder pain sometime in late 2012. She had followup mammograms of until 2011. There was no mammogram performed in 2012. She was seen by physical therapy in orthopedic surgery for evaluation of her right shoulder pain. She had a mammogram on 07/24/2012 which showed a suspicious mass behind eczema of the right breast. Physical exam the time did show multiple involved right axillary lymph nodes. 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 HER-2 nonamplified the ratio by [facility] being 1.43. Proliferative index was  50%.   MRI scan of both breasts performed on 08/31/2012 showed multiple involved lymph nodes in the right axilla, no obvious masses in either breast. Unfortunately staging PET scan 09/12/2012 showed in addition to her right axillary recurrence, bilateral mediastinal and left  lung nodules consistent with stage IV disease. Her subsequent history is as detailed below.  INTERVAL HISTORY: Debbie Larsen returns today for followup of her metastatic breast cancer. After finding that her tumor carries EGFR and Pi3 kinase mutations weekly Aredia both [facility] and [facility] regarding possible studies. Debbie Larsen has an appointment with Kandice Hams at [facility] for next week. I have gone over the response this I received from [facility] as well and have given her the phone numbers of the research coordinators there for her to contact her directly.  REVIEW OF SYSTEMS: She was doing "fine". She denies any right upper extremity pain or swelling at present. She has a little bit of soreness in the right axilla. There have been no fevers, rash, bleeding, weight loss, or fatigue. A detailed review of systems today was entirely stable  PAST MEDICAL HISTORY: Past Medical History  Diagnosis Date  . 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 Hypothyroid   . Enlarged lymph nodes   . BREAST CANCER 04/2001    s/p radiation therapy, chemotherapy and lumpectomy  . 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  . S/P radiation therapy     Right breast/regional lymph nodes/ 5040 cGy / 28 fractions/ bost of 1200 cGy/ 6 Fractions  . Status post chemotherapy     6 cycles of CMF completed on 12/12/2001  . Right shoulder pain     started late 2012  . Dry cough     intermittent  . SOB (shortness of breath)   . Use of letrozole (Femara) 03/14/2013  . Metastasis to bone 03/13/13  PET scan results  . Metastasis to bone     breast primary    PAST SURGICAL HISTORY: Past Surgical History  Procedure Laterality Date  . Tonsillectomy    . Breast lumpectomy  04/2001    excision of L breast fibroadenoma by Dr.   . Breast lumpectomy  05/16/2001    Right axillary sentinel lymph node biopsy.  . Portacath placement  09/19/2012    Procedure:  INSERTION PORT-A-CATH;  Surgeon: [name], MD;  Location: [facility] OR;  Service: General;  Laterality: N/A;  . Lipoma excision Right 05/15/2013    Procedure: RIGHT AXILLARY LYMPH NODE DISSECTION;  Surgeon: [name], MD;  Location: [facility] ORS;  Service: General;  Laterality: Right;    FAMILY HISTORY Family History  Problem Relation Age of Onset  . Diabetes Mother   . Hypertension Father   . Hypertension Sister   . Hypothyroidism Brother   . Cancer Neg Hx   The patient's father died from heart disease at the age of 43. The patient's mother died August 06, 2012 at the age of 65 also from heart disease. The patient has 2 sisters and 2 brothers. There is no history of breast or ovarian cancer in the family to her knowledge.  GYNECOLOGIC HISTORY: G1 P1, first live birth age 16. She underwent menopause at the time of her chemotherapy in 2002. She did not use hormone replacement.  SOCIAL HISTORY: The patient is divorced and lives by herself. Her son Kazoua Gossen lives in [place], where he works as an Publishing copy. The patient tells me her son lives with 3 roommates" there is no room for me there". (Accordingly going to [employer] for studies is not an option for her). The patient has no grandchildren. She attends the local [employer].   ADVANCED DIRECTIVES: The patient tells me (11/01/2012) her healthcare power of attorney is her sister Sulema Braid. She was unable to give me her contact information   HEALTH MAINTENANCE: History  Substance Use Topics  . Smoking status: Never Smoker   . Smokeless tobacco: Never Used  . Alcohol Use: No     Colonoscopy: never  PAP: 08/17/2012  Bone density: never  Lipid panel: Not on file  No Known Allergies  Current Outpatient Prescriptions  Medication Sig Dispense Refill  . B Complex-C (B-COMPLEX WITH VITAMIN C) tablet Take 1 tablet by mouth daily.      . calcium-vitamin D (OSCAL WITH D) 500-200 MG-UNIT per tablet Take 1 tablet  by mouth daily with breakfast.      . levothyroxine (SYNTHROID, LEVOTHROID) 75 MCG tablet Take 75 mcg by mouth every evening.      . lidocaine-prilocaine (EMLA) cream Apply topically as needed. Apply to port 1-2 hours before procedure  30 g  0  . Multiple Vitamin (MULTIVITAMIN WITH MINERALS) TABS Take 1 tablet by mouth daily.       No current facility-administered medications for this visit.   Facility-Administered Medications Ordered in Other Visits  Medication Dose Route Frequency Provider Last Rate Last Dose  . heparin lock flush 100 unit/mL  500 Units Intravenous Once Lowella Dell, MD      . sodium chloride 0.9 % injection 10 mL  10 mL Intravenous PRN Lowella Dell, MD       Objective: Middle-aged [place] Asian woman in no acute distress Filed Vitals:   06/24/13 1207  BP: 123/73  Pulse: 87  Temp: 99.1 F (37.3 C)  Resp: 20   Filed  Vitals:   06/24/13 1207  Height: 5\' 1"  (1.549 m)  Weight: 119 lb 12.8 oz (54.341 kg)    Sclerae unicteric Oropharynx clear No cervical or supraclavicular adenopathy Lungs no rales or rhonchi, Heart regular rate and rhythm Abd soft, nontender, positive bowel sounds MSK no focal spinal tenderness, no peripheral edema; Neuro: Nonfocal; well-oriented; pleasant affect Breasts: The right breast is status post lumpectomy and radiation. The right axilla shows no palpable mass. Left breast is unremarkable   LAB RESULTS: Lab Results  Component Value Date   WBC 6.3 06/07/2013   NEUTROABS 3.2 06/07/2013   HGB 11.9 06/07/2013   HCT 35.7 06/07/2013   MCV 81.4 06/07/2013   PLT 240 06/07/2013      Chemistry      Component Value Date/Time   NA 140 06/07/2013 1052   NA 138 09/18/2012 1531   K 4.0 06/07/2013 1052   K 3.7 09/18/2012 1531   CL 106 03/21/2013 1422   CL 103 09/18/2012 1531   CO2 22 06/07/2013 1052   CO2 23 09/18/2012 1531   BUN 11.2 06/07/2013 1052   BUN 9 09/18/2012 1531   CREATININE 0.8 06/07/2013 1052   CREATININE 0.70 09/18/2012 1531    CREATININE 0.74 03/01/2012 1715      Component Value Date/Time   CALCIUM 9.5 06/07/2013 1052   CALCIUM 9.8 09/18/2012 1531   ALKPHOS 89 06/07/2013 1052   ALKPHOS 81 09/18/2012 1531   AST 21 06/07/2013 1052   AST 18 09/18/2012 1531   ALT 19 06/07/2013 1052   ALT 13 09/18/2012 1531   BILITOT 0.36 06/07/2013 1052   BILITOT 0.3 09/18/2012 1531       Lab Results  Component Value Date   LABCA2 27 12/27/2012     STUDIES: No results found.  ASSESSMENT: 62 y.o. [place] woman originally from [place] with stage IV breast cancer:  (1) Status post right lumpectomy and sentinel lymph node sampling 05/16/2001 for a pT1c pN1, stage IIA invasive ductal carcinoma, grade 2, estrogen receptor 95% and progesterone receptor 96% positive, with an MIB-1 of 11% and no HER-2 amplification by immunocytochemistry.  (2) Treated adjuvantly with cyclophosphamide, methotrexate and fluorouracil, likely for 8 cycles (data unavailable), completed February 2003.  (3) Completed radiation to the right breast and regional lymph nodes 02/12/2002.  (4) The patient apparently refused completion axillary lymph node dissection and antiestrogen therapy (data unavailable).  (5) Recurrent disease documented by right axillary lymph node biopsy 08/23/2012, showing a high-grade invasive ductal carcinoma which was estrogen and progesterone receptor negative, with an MIB-1 of 59%, and no HER-2 amplification; staging studies showed hyper metabolic mediastinal and left lung lesions, with no involvement of the liver or brain  (6) Abraxane every 2 weeks started 09/20/2012, with good tolerance and evidence of response, completing 12 doses 02/21/2013.  (7) restaging studies at [facility] March 2014 and here April-May 2014 as follows  (a) bone involvement: [facility] bone scan showed focal tracer activity in the left acetabulum, left 12th rib posteriorly, and right sixth rib posteriorly; on CT scan a 9 mm sclerotic sacral lesion was new as compared to  November of 2013.  (b) [facility] CT of the chest, abdomen and pelvis showed slight decrease in size of some mediastinal lymph nodes, persistent unchanged nodularity in the right axilla, normal liver, and no new or enlarging pulmonary nodules  (c) MRI of the cervical spine shows no obvious lesion to explain the patient's Right arm symptoms  (d) PET scan shows multiple bone lesions (including  C7), mild growth in index lymph nodes, no change in possible left lung nodules, and no liver involvement  (7) genetic testing obtained at [facility] 03/19/2013, results pending  (8) zolendronate started 02/21/2013, continuing monthly   (9) nonspecific myositis RUE documented at [facility] by EMG March 2014; cervical MRI DOES not explain symptoms, which are likely due to a right axillary radiculopathy  (10) status post right axillary lymph node dissection 05/15/2013, special studies showing the tumor to have mutations in the EGFR gene (A743T (exon 19) and Pi3CA (K111E, H1047R)   PLAN:  She remains very interested in participating in research studies. At the same time she would like the studies to be open here. I explained this is not impossible but it would take time. If she wanted to be treated here, unless we could access some of the studies from [facility] her [facility], it would likely be many months before something would be available. In that case she might do better starting some kind of chemotherapy.  She is reluctant to go back to Abraxane or CMF, though she tolerated both of them well. There are multiple other options including Doxil, eribulin, Gemzar, etc. She understands many of these can be well-tolerated, but we are not going to start these until she makes a definitive decision whether or not she wants to participate in a study either at [facility] or [facility].  We elected that she is [facility], and followup with her appointment with Dr. Amie Critchley at [facility]. Tentatively I am setting  her up for a repeat CT of the chest September 3 and to see me a few days later. At that time I hope she will be ready to make a definitive decision and we can proceed with treatment.  Lowella Dell, MD  06/24/2013

## 2013-06-25 ENCOUNTER — Telehealth: Payer: Self-pay | Admitting: Oncology

## 2013-06-25 NOTE — Telephone Encounter (Signed)
Added f/u appt for 9/9. S/w pt she is aware.

## 2013-06-26 ENCOUNTER — Ambulatory Visit: Payer: Medicaid Other | Attending: Surgery | Admitting: Physical Therapy

## 2013-06-26 DIAGNOSIS — M24519 Contracture, unspecified shoulder: Secondary | ICD-10-CM | POA: Insufficient documentation

## 2013-06-26 DIAGNOSIS — IMO0001 Reserved for inherently not codable concepts without codable children: Secondary | ICD-10-CM | POA: Insufficient documentation

## 2013-06-26 DIAGNOSIS — Z853 Personal history of malignant neoplasm of breast: Secondary | ICD-10-CM | POA: Insufficient documentation

## 2013-06-26 DIAGNOSIS — I890 Lymphedema, not elsewhere classified: Secondary | ICD-10-CM | POA: Insufficient documentation

## 2013-06-27 ENCOUNTER — Other Ambulatory Visit: Payer: Self-pay | Admitting: *Deleted

## 2013-06-27 NOTE — Progress Notes (Signed)
Patient calling in, has been expecting Zometa to be scheduled for 8/29, calling for appt.

## 2013-06-28 ENCOUNTER — Other Ambulatory Visit: Payer: Self-pay | Admitting: *Deleted

## 2013-07-02 ENCOUNTER — Telehealth: Payer: Self-pay | Admitting: Oncology

## 2013-07-02 ENCOUNTER — Encounter (INDEPENDENT_AMBULATORY_CARE_PROVIDER_SITE_OTHER): Payer: Self-pay | Admitting: Surgery

## 2013-07-02 ENCOUNTER — Ambulatory Visit (INDEPENDENT_AMBULATORY_CARE_PROVIDER_SITE_OTHER): Payer: Medicaid Other | Admitting: Surgery

## 2013-07-02 VITALS — BP 128/60 | HR 68 | Resp 16 | Ht 60.0 in | Wt 119.6 lb

## 2013-07-02 DIAGNOSIS — Z09 Encounter for follow-up examination after completed treatment for conditions other than malignant neoplasm: Secondary | ICD-10-CM

## 2013-07-02 NOTE — Patient Instructions (Signed)
See me in three months

## 2013-07-02 NOTE — Progress Notes (Signed)
Debbie Larsen [facility]                                            DOB: 1951-10-16 DATE: 07/02/2013                                                  MRN: [id number]  CC:  Chief Complaint  Patient presents with  . Routine Post Op    HPI: This patient comes in for post op follow-up .Sheunderwent a right axillary dissection on 05/15/13. She feels that she is doing wellShe is still doing PT for her shoulder PE:  VITAL SIGNS: There were no vitals taken for this visit.  General: The patient appears to be healthy, NAD Incison healing nicely; continued limited ROM right shoulder  DATA REVIEWED: Path noted  IMPRESSION: The patient is doing well S/P axillary dissection for recurrent breast cancer.    PLAN: RTC 3 months

## 2013-07-02 NOTE — Telephone Encounter (Signed)
Not able to accommodate zometa on 9/3 as per 8/28 pof asking that appt be moved from 9/11 to 9/3 if poss. S/w pt and per pt request moved lb/zometa to 9/9 w/fu appt. Pt given new d/t for lb/fu/zometa 9/9 @ 2pm.

## 2013-07-03 ENCOUNTER — Encounter: Payer: Self-pay | Admitting: *Deleted

## 2013-07-03 ENCOUNTER — Encounter (HOSPITAL_COMMUNITY): Payer: Self-pay

## 2013-07-03 ENCOUNTER — Ambulatory Visit (HOSPITAL_COMMUNITY)
Admission: RE | Admit: 2013-07-03 | Discharge: 2013-07-03 | Disposition: A | Discharge status: Home / Self Care | Payer: Medicaid Other | Source: Ambulatory Visit | Attending: Oncology | Admitting: Oncology

## 2013-07-03 DIAGNOSIS — Z9221 Personal history of antineoplastic chemotherapy: Secondary | ICD-10-CM | POA: Insufficient documentation

## 2013-07-03 DIAGNOSIS — C50919 Malignant neoplasm of unspecified site of unspecified female breast: Secondary | ICD-10-CM

## 2013-07-03 DIAGNOSIS — R911 Solitary pulmonary nodule: Secondary | ICD-10-CM | POA: Insufficient documentation

## 2013-07-03 MED ORDER — IOHEXOL 300 MG/ML  SOLN
80.0000 mL | Freq: Once | INTRAMUSCULAR | Status: AC | PRN
  Administered 2013-07-03: 80 mL via INTRAVENOUS

## 2013-07-03 NOTE — Progress Notes (Signed)
Spoke to [facility] at [employer] regarding study.  Sent request for path slides and radiology slides to be sent to [facility]  Pt scheduled to see Dr. Archie Balboa on 07/11/13 at 3:30pm.  Informed care team.

## 2013-07-09 ENCOUNTER — Ambulatory Visit (HOSPITAL_BASED_OUTPATIENT_CLINIC_OR_DEPARTMENT_OTHER): Payer: Medicaid Other

## 2013-07-09 ENCOUNTER — Ambulatory Visit (HOSPITAL_BASED_OUTPATIENT_CLINIC_OR_DEPARTMENT_OTHER): Payer: Medicaid Other | Admitting: Oncology

## 2013-07-09 ENCOUNTER — Other Ambulatory Visit: Payer: Self-pay | Admitting: Oncology

## 2013-07-09 ENCOUNTER — Other Ambulatory Visit (HOSPITAL_BASED_OUTPATIENT_CLINIC_OR_DEPARTMENT_OTHER): Payer: Medicaid Other | Admitting: Lab

## 2013-07-09 ENCOUNTER — Encounter: Payer: Self-pay | Admitting: *Deleted

## 2013-07-09 VITALS — BP 117/69 | HR 97 | Temp 98.1°F | Resp 18 | Ht 61.0 in | Wt 118.5 lb

## 2013-07-09 DIAGNOSIS — C7951 Secondary malignant neoplasm of bone: Secondary | ICD-10-CM

## 2013-07-09 DIAGNOSIS — C50919 Malignant neoplasm of unspecified site of unspecified female breast: Secondary | ICD-10-CM

## 2013-07-09 DIAGNOSIS — C50219 Malignant neoplasm of upper-inner quadrant of unspecified female breast: Secondary | ICD-10-CM

## 2013-07-09 DIAGNOSIS — C773 Secondary and unspecified malignant neoplasm of axilla and upper limb lymph nodes: Secondary | ICD-10-CM

## 2013-07-09 DIAGNOSIS — C7800 Secondary malignant neoplasm of unspecified lung: Secondary | ICD-10-CM

## 2013-07-09 LAB — COMPREHENSIVE METABOLIC PANEL (CC13)
AST: 36 U/L — ABNORMAL HIGH (ref 5–34)
Albumin: 4 g/dL (ref 3.5–5.0)
Alkaline Phosphatase: 112 U/L (ref 40–150)
Chloride: 105 mEq/L (ref 98–109)
Glucose: 92 mg/dl (ref 70–140)
Potassium: 4.4 mEq/L (ref 3.5–5.1)
Sodium: 138 mEq/L (ref 136–145)
Total Protein: 8 g/dL (ref 6.4–8.3)

## 2013-07-09 LAB — CBC WITH DIFFERENTIAL/PLATELET
Eosinophils Absolute: 0.1 10*3/uL (ref 0.0–0.5)
MCV: 81.5 fL (ref 79.5–101.0)
MONO%: 8.6 % (ref 0.0–14.0)
NEUT#: 5.4 10*3/uL (ref 1.5–6.5)
RBC: 4.43 10*6/uL (ref 3.70–5.45)
RDW: 14.7 % — ABNORMAL HIGH (ref 11.2–14.5)
WBC: 8.5 10*3/uL (ref 3.9–10.3)
lymph#: 2.3 10*3/uL (ref 0.9–3.3)

## 2013-07-09 MED ORDER — SODIUM CHLORIDE 0.9 % IJ SOLN
10.0000 mL | INTRAMUSCULAR | Status: AC | PRN
  Administered 2013-07-09: 10 mL via INTRAVENOUS
  Filled 2013-07-09: qty 10

## 2013-07-09 MED ORDER — HEPARIN SOD (PORK) LOCK FLUSH 100 UNIT/ML IV SOLN
500.0000 [IU] | Freq: Once | INTRAVENOUS | Status: AC
  Administered 2013-07-09: 500 [IU] via INTRAVENOUS
  Filled 2013-07-09: qty 5

## 2013-07-09 MED ORDER — SODIUM CHLORIDE 0.9 % IV SOLN
INTRAVENOUS | Status: AC
  Administered 2013-07-09: 17:00:00 via INTRAVENOUS

## 2013-07-09 MED ORDER — SODIUM CHLORIDE 0.9 % IV SOLN
4.0000 mg | Freq: Once | INTRAVENOUS | Status: AC
  Administered 2013-07-09: 4 mg via INTRAVENOUS
  Filled 2013-07-09: qty 5

## 2013-07-09 NOTE — Patient Instructions (Addendum)
[facility] Discharge Instructions for Patients Receiving Chemotherapy  Today you received the following chemotherapy agents: zometa  To help prevent nausea and vomiting after your treatment, we encourage you to take your nausea medication.  Take it as often as prescribed.     If you develop nausea and vomiting that is not controlled by your nausea medication, call the clinic. If it is after clinic hours your family physician or the after hours number for the clinic or go to the Emergency Department.   BELOW ARE SYMPTOMS THAT SHOULD BE REPORTED IMMEDIATELY:  *FEVER GREATER THAN 100.5 F  *CHILLS WITH OR WITHOUT FEVER  NAUSEA AND VOMITING THAT IS NOT CONTROLLED WITH YOUR NAUSEA MEDICATION  *UNUSUAL SHORTNESS OF BREATH  *UNUSUAL BRUISING OR BLEEDING  TENDERNESS IN MOUTH AND THROAT WITH OR WITHOUT PRESENCE OF ULCERS  *URINARY PROBLEMS  *BOWEL PROBLEMS  UNUSUAL RASH Items with * indicate a potential emergency and should be followed up as soon as possible.  Feel free to call the clinic you have any questions or concerns. The clinic phone number is [phone].   I have been informed and understand all the instructions given to me. I know to contact the clinic, my physician, or go to the Emergency Department if any problems should occur. I do not have any questions at this time, but understand that I may call the clinic during office hours   should I have any questions or need assistance in obtaining follow up care.    __________________________________________  _____________  __________ Signature of Patient or Authorized Representative            Date                   Time    __________________________________________ Nurse's Signature

## 2013-07-09 NOTE — Progress Notes (Signed)
Patient ID: Debbie Larsen, female   DOB: 1951/02/27, 62 y.o.   MRN: [id number] ID: Debbie Larsen   DOB: [birth year]  MR#: [id number]  CSN#:[id number]  PCP: Rodman Pickle, MD SU: [name], MD OTHER MD: Nicholes Mango, [name], Kandice Hams, Maeola Harman   HISTORY OF PRESENT ILLNESS: The patient's 2002-2004 records have been retrieved. The sugar the patient underwent a right breast core biopsy 03/29/2001 for an invasive ductal carcinoma associated with necrosis ([id number]). 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 The tumor was estrogen receptor 95% positive, progesterone receptor 96% positive, with an MIB-1 of 11%, and no HER-2 amplification by the HercepTest. She completed 6 cycles of CMF chemotherapy on 12/12/2001. There were minor delays, but no dose reductions. She received radiation to the right breast and regional lymph nodes to a total of 5040 cGy with a right breast boost of 12 [name], completed 02/12/2002  More recently, "Debbie Larsen" develop some right shoulder pain sometime in late 2012. She had followup mammograms of until 2011. There was no mammogram performed in 2012. She was seen by physical therapy in orthopedic surgery for evaluation of her right shoulder pain. She had a mammogram on 07/24/2012 which showed a suspicious mass behind eczema of the right breast. Physical exam the time did show multiple involved right axillary lymph nodes. 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 HER-2 nonamplified the ratio by [facility] being 1.43. Proliferative index was  50%.   MRI scan of both breasts performed on 08/31/2012 showed multiple involved lymph nodes in the right axilla, no obvious masses in either breast. Unfortunately staging PET scan 09/12/2012  showed in addition to her right axillary recurrence, bilateral mediastinal and left lung nodules consistent with stage IV disease. Her subsequent history is as detailed below.  INTERVAL HISTORY: Debbie Larsen returns today for followup of her metastatic breast cancer. After finding that her tumor carries EGFR and Pi3 kinase mutations weekly Aredia both [facility] and [facility] regarding possible studies. Debbie Larsen has an appointment with Kandice Hams at [facility] for next week. I have gone over the response this I received from [facility] as well and have given her the phone numbers of the research coordinators there for her to contact her directly.  REVIEW OF SYSTEMS: She was doing "fine". She denies any right upper extremity pain or swelling at present. She has a little bit of soreness in the right axilla. There have been no fevers, rash, bleeding, weight loss, or fatigue. A detailed review of systems today was entirely stable  PAST MEDICAL HISTORY: Past Medical History  Diagnosis Date  . 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 Hypothyroid   . Enlarged lymph nodes   . BREAST CANCER 04/2001    s/p radiation therapy, chemotherapy and lumpectomy  . 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  . S/P radiation therapy     Right breast/regional lymph nodes/ 5040 cGy / 28 fractions/ bost of 1200 cGy/ 6 Fractions  . Status post chemotherapy     6 cycles of CMF completed on 12/12/2001  . Right shoulder pain     started late 2012  . Dry cough     intermittent  . SOB (shortness  of breath)   . Use of letrozole (Femara) 03/14/2013  . Metastasis to bone 03/13/13    PET scan results  . Metastasis to bone     breast primary    PAST SURGICAL HISTORY: Past Surgical History  Procedure Laterality Date  . Tonsillectomy    . Breast lumpectomy  04/2001    excision of L breast fibroadenoma by Dr.   . Breast lumpectomy  05/16/2001    Right  axillary sentinel lymph node biopsy.  . Portacath placement  09/19/2012    Procedure: INSERTION PORT-A-CATH;  Surgeon: [name], MD;  Location: [facility] OR;  Service: General;  Laterality: N/A;  . Lipoma excision Right 05/15/2013    Procedure: RIGHT AXILLARY LYMPH NODE DISSECTION;  Surgeon: [name], MD;  Location: [facility] ORS;  Service: General;  Laterality: Right;    FAMILY HISTORY Family History  Problem Relation Age of Onset  . Diabetes Mother   . Hypertension Father   . Hypertension Sister   . Hypothyroidism Brother   . Cancer Neg Hx   The patient's father died from heart disease at the age of 56. The patient's mother died Aug 05, 2012 at the age of 29 also from heart disease. The patient has 2 sisters and 2 brothers. There is no history of breast or ovarian cancer in the family to her knowledge.  GYNECOLOGIC HISTORY: G1 P1, first live birth age 89. She underwent menopause at the time of her chemotherapy in 2002. She did not use hormone replacement.  SOCIAL HISTORY: The patient is divorced and lives by herself. Her son Terease Marcotte lives in [place], where he works as an Publishing copy. The patient tells me her son lives with 3 roommates" there is no room for me there". (Accordingly going to [employer] for studies is not an option for her). The patient has no grandchildren. She attends the local [employer].   ADVANCED DIRECTIVES: The patient tells me (11/01/2012) her healthcare power of attorney is her sister Bhakti Labella. She was unable to give me her contact information   HEALTH MAINTENANCE: History  Substance Use Topics  . Smoking status: Never Smoker   . Smokeless tobacco: Never Used  . Alcohol Use: No     Colonoscopy: never  PAP: 08/17/2012  Bone density: never  Lipid panel: Not on file  No Known Allergies  Current Outpatient Prescriptions  Medication Sig Dispense Refill  . B Complex-C (B-COMPLEX WITH VITAMIN C) tablet Take 1 tablet by mouth  daily.      . calcium-vitamin D (OSCAL WITH D) 500-200 MG-UNIT per tablet Take 1 tablet by mouth daily with breakfast.      . levothyroxine (SYNTHROID, LEVOTHROID) 75 MCG tablet Take 75 mcg by mouth every evening.      . lidocaine-prilocaine (EMLA) cream Apply topically as needed. Apply to port 1-2 hours before procedure  30 g  0  . Multiple Vitamin (MULTIVITAMIN WITH MINERALS) TABS Take 1 tablet by mouth daily.       No current facility-administered medications for this visit.   Facility-Administered Medications Ordered in Other Visits  Medication Dose Route Frequency Provider Last Rate Last Dose  . heparin lock flush 100 unit/mL  500 Units Intravenous Once Lowella Dell, MD      . sodium chloride 0.9 % injection 10 mL  10 mL Intravenous PRN Lowella Dell, MD       Objective: Middle-aged [place] Asian woman in no acute distress Filed Vitals:   07/09/13  1446  BP: 117/69  Pulse: 97  Temp: 98.1 F (36.7 C)  Resp: 18   Filed Vitals:   07/09/13 1446  Height: 5\' 1"  (1.549 m)  Weight: 53.751 kg (118 lb 8 oz)    Sclerae unicteric Oropharynx clear No cervical or supraclavicular adenopathy Lungs no rales or rhonchi, Heart regular rate and rhythm Abd soft, nontender, positive bowel sounds MSK no focal spinal tenderness, no peripheral edema; Neuro: Nonfocal; well-oriented; pleasant affect Breasts: The right breast is status post lumpectomy and radiation. The right axilla shows no palpable mass. Left breast is unremarkable   LAB RESULTS: Lab Results  Component Value Date   WBC 6.3 06/07/2013   NEUTROABS 3.2 06/07/2013   HGB 11.9 06/07/2013   HCT 35.7 06/07/2013   MCV 81.4 06/07/2013   PLT 240 06/07/2013      Chemistry      Component Value Date/Time   NA 140 06/07/2013 1052   NA 138 09/18/2012 1531   K 4.0 06/07/2013 1052   K 3.7 09/18/2012 1531   CL 106 03/21/2013 1422   CL 103 09/18/2012 1531   CO2 22 06/07/2013 1052   CO2 23 09/18/2012 1531   BUN 11.2 06/07/2013 1052   BUN 9  09/18/2012 1531   CREATININE 0.8 06/07/2013 1052   CREATININE 0.70 09/18/2012 1531   CREATININE 0.74 03/01/2012 1715      Component Value Date/Time   CALCIUM 9.5 06/07/2013 1052   CALCIUM 9.8 09/18/2012 1531   ALKPHOS 89 06/07/2013 1052   ALKPHOS 81 09/18/2012 1531   AST 21 06/07/2013 1052   AST 18 09/18/2012 1531   ALT 19 06/07/2013 1052   ALT 13 09/18/2012 1531   BILITOT 0.36 06/07/2013 1052   BILITOT 0.3 09/18/2012 1531       Lab Results  Component Value Date   LABCA2 27 12/27/2012     STUDIES: No results found.  ASSESSMENT: 62 y.o. [place] woman originally from [place] with stage IV breast cancer:  (1) Status post right lumpectomy and sentinel lymph node sampling 05/16/2001 for a pT1c pN1, stage IIA invasive ductal carcinoma, grade 2, estrogen receptor 95% and progesterone receptor 96% positive, with an MIB-1 of 11% and no HER-2 amplification by immunocytochemistry.  (2) Treated adjuvantly with cyclophosphamide, methotrexate and fluorouracil, likely for 8 cycles (data unavailable), completed February 2003.  (3) Completed radiation to the right breast and regional lymph nodes 02/12/2002.  (4) The patient apparently refused completion axillary lymph node dissection and antiestrogen therapy (data unavailable).  (5) Recurrent disease documented by right axillary lymph node biopsy 08/23/2012, showing a high-grade invasive ductal carcinoma which was estrogen and progesterone receptor negative, with an MIB-1 of 59%, and no HER-2 amplification; staging studies showed hyper metabolic mediastinal and left lung lesions, with no involvement of the liver or brain  (6) Abraxane every 2 weeks started 09/20/2012, with good tolerance and evidence of response, completing 12 doses 02/21/2013.  (7) restaging studies at [facility] March 2014 and here April-May 2014 as follows  (a) bone involvement: [facility] bone scan showed focal tracer activity in the left acetabulum, left 12th rib posteriorly, and right sixth  rib posteriorly; on CT scan a 9 mm sclerotic sacral lesion was new as compared to November of 2013.  (b) [facility] CT of the chest, abdomen and pelvis showed slight decrease in size of some mediastinal lymph nodes, persistent unchanged nodularity in the right axilla, normal liver, and no new or enlarging pulmonary nodules  (c) MRI of the cervical spine shows  no obvious lesion to explain the patient's Right arm symptoms  (d) PET scan shows multiple bone lesions (including C7), mild growth in index lymph nodes, no change in possible left lung nodules, and no liver involvement  (7) genetic testing obtained at [facility] 03/19/2013, results pending  (8) zolendronate started 02/21/2013, continuing monthly   (9) nonspecific myositis RUE documented at [facility] by EMG March 2014; cervical MRI DOES not explain symptoms, which are likely due to a right axillary radiculopathy  (10) status post right axillary lymph node dissection 05/15/2013, special studies showing the tumor to have mutations in the EGFR gene (A743T (exon 19) and Pi3CA (K111E, [id number])   PLAN:  She remains very interested in participating in research studies. At the same time she would like the studies to be open here. I explained this is not impossible but it would take time. If she wanted to be treated here, unless we could access some of the studies from [facility] her [facility], it would likely be many months before something would be available. In that case she might do better starting some kind of chemotherapy.  She is reluctant to go back to Abraxane or CMF, though she tolerated both of them well. There are multiple other options including Doxil, eribulin, Gemzar, etc. She understands many of these can be well-tolerated, but we are not going to start these until she makes a definitive decision whether or not she wants to participate in a study either at [facility] or [facility].  We elected that [facility], and  followup with her appointment with Dr. Amie Critchley at [facility]. Tentatively I am setting her up for a repeat CT of the chest September 3 and to see me a few days later. At that time I hope she will be ready to make a definitive decision and we can proceed with treatment.  Lowella Dell, MD  07/09/2013

## 2013-07-09 NOTE — Progress Notes (Signed)
Per Dr. Darnelle Catalan pt may have consult with Dr. Welton Flakes to discuss triple - breast cancer.  Discussed this with Dr. Welton Flakes.  Scheduled pt to be seen on 07/30/13 at 1145.

## 2013-07-09 NOTE — Progress Notes (Signed)
Patient ID: Debbie Larsen, female   DOB: 1950/12/05, 62 y.o.   MRN: [id number] ID: Debbie Larsen   DOB: 1950/12/16  MR#: [id number]  CSN#:[id number]  PCP: Rodman Pickle, MD SU: [name], MD OTHER MD: Nicholes Mango, [name], Kandice Hams, Austin Miles   HISTORY OF PRESENT ILLNESS: The patient's 2002-2004 records have been retrieved. The sugar the patient underwent a right breast core biopsy 03/29/2001 for an invasive ductal carcinoma associated with necrosis ([id number]). 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 The tumor was estrogen receptor 95% positive, progesterone receptor 96% positive, with an MIB-1 of 11%, and no HER-2 amplification by the HercepTest. She completed 6 cycles of CMF chemotherapy on 12/12/2001. There were minor delays, but no dose reductions. She received radiation to the right breast and regional lymph nodes to a total of 5040 cGy with a right breast boost of 12 [name], completed 02/12/2002  More recently, "Debbie Larsen" develop some right shoulder pain sometime in late 2012. She had followup mammograms of until 2011. There was no mammogram performed in 2012. She was seen by physical therapy in orthopedic surgery for evaluation of her right shoulder pain. She had a mammogram on 07/24/2012 which showed a suspicious mass behind eczema of the right breast. Physical exam the time did show multiple involved right axillary lymph nodes. 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 HER-2 nonamplified the ratio by C[facility] being 1.43. Proliferative index was  50%.   MRI scan of both breasts performed on 08/31/2012 showed multiple involved lymph nodes in the right axilla, no obvious masses in either breast. Unfortunately staging PET scan  09/12/2012 showed in addition to her right axillary recurrence, bilateral mediastinal and left lung nodules consistent with stage IV disease. Her subsequent history is as detailed below.  INTERVAL HISTORY: Debbie Larsen returns today for followup of her metastatic breast cancer. She is tolerating the zolendronate with no obvious side effects. She continues to explore possible studies at [facility] in [facility] and she is reading multiple reports in the literature suggesting possible benefit from an anti-EGFR agents.   REVIEW OF SYSTEMS: Clinically she is doing remarkably well. She has recovered from her right axillary surgery, with no right upper extremity lymphedema. She does have some joint pains, which are not more intense or frequent than before. Aside from anxiety, she has normal activity for her and no specific complaints. A detailed review of systems was noncontributory.   PAST MEDICAL HISTORY: Past Medical History  Diagnosis Date  . 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 Hypothyroid   . Enlarged lymph nodes   . BREAST CANCER 04/2001    s/p radiation therapy, chemotherapy and lumpectomy  . 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  . S/P radiation therapy     Right breast/regional lymph nodes/ 5040 cGy / 28 fractions/ bost of 1200 cGy/ 6 Fractions  . Status post chemotherapy     6 cycles of CMF completed on 12/12/2001  . Right shoulder pain     started late 2012  . Dry cough     intermittent  . SOB (shortness of breath)   . Use of letrozole (Femara) 03/14/2013  . Metastasis to bone 03/13/13  PET scan results  . Metastasis to bone     breast primary    PAST SURGICAL HISTORY: Past Surgical History  Procedure Laterality Date  . Tonsillectomy    . Breast lumpectomy  04/2001    excision of L breast fibroadenoma by Dr.   . Breast lumpectomy  05/16/2001    Right axillary sentinel lymph node biopsy.  . Portacath  placement  09/19/2012    Procedure: INSERTION PORT-A-CATH;  Surgeon: [name], MD;  Location: [facility] OR;  Service: General;  Laterality: N/A;  . Lipoma excision Right 05/15/2013    Procedure: RIGHT AXILLARY LYMPH NODE DISSECTION;  Surgeon: [name], MD;  Location: [facility] ORS;  Service: General;  Laterality: Right;    FAMILY HISTORY Family History  Problem Relation Age of Onset  . Diabetes Mother   . Hypertension Father   . Hypertension Sister   . Hypothyroidism Brother   . Cancer Neg Hx   The patient's father died from heart disease at the age of 91. The patient's mother died 07-20-2012 at the age of 37 also from heart disease. The patient has 2 sisters and 2 brothers. There is no history of breast or ovarian cancer in the family to her knowledge.  GYNECOLOGIC HISTORY: G1 P1, first live birth age 55. She underwent menopause at the time of her chemotherapy in 2002. She did not use hormone replacement.  SOCIAL HISTORY: The patient is divorced and lives by herself. Her son Debbie Larsen lives in [place], where he works as an Publishing copy. The patient tells me her son lives with 3 roommates" there is no room for me there". (Accordingly going to [employer] for studies is not an option for her). The patient has no grandchildren. She attends the local [employer].   ADVANCED DIRECTIVES: The patient tells me (11/01/2012) her healthcare power of attorney is her sister Audyn Dimercurio. She was unable to give me her contact information   HEALTH MAINTENANCE: History  Substance Use Topics  . Smoking status: Never Smoker   . Smokeless tobacco: Never Used  . Alcohol Use: No     Colonoscopy: never  PAP: 08/17/2012  Bone density: never  Lipid panel: Not on file  No Known Allergies  Current Outpatient Prescriptions  Medication Sig Dispense Refill  . B Complex-C (B-COMPLEX WITH VITAMIN C) tablet Take 1 tablet by mouth daily.      . calcium-vitamin D (OSCAL WITH D)  500-200 MG-UNIT per tablet Take 1 tablet by mouth daily with breakfast.      . levothyroxine (SYNTHROID, LEVOTHROID) 75 MCG tablet Take 75 mcg by mouth every evening.      . lidocaine-prilocaine (EMLA) cream Apply topically as needed. Apply to port 1-2 hours before procedure  30 g  0  . Multiple Vitamin (MULTIVITAMIN WITH MINERALS) TABS Take 1 tablet by mouth daily.       No current facility-administered medications for this visit.   Facility-Administered Medications Ordered in Other Visits  Medication Dose Route Frequency Provider Last Rate Last Dose  . heparin lock flush 100 unit/mL  500 Units Intravenous Once Lowella Dell, MD      . sodium chloride 0.9 % injection 10 mL  10 mL Intravenous PRN Lowella Dell, MD       Objective: Middle-aged [place] Asian woman who appears well Filed Vitals:   07/09/13 1446  BP: 117/69  Pulse: 97  Temp: 98.1 F (36.7 C)  Resp: 18   Filed Vitals:  07/09/13 1446  Height: 5\' 1"  (1.549 m)  Weight: 53.751 kg (118 lb 8 oz)    Sclerae unicteric, pupils equal round and reactive Oropharynx clear No cervical or supraclavicular adenopathy Lungs no rales or rhonchi, Heart regular rate and rhythm Abd soft, nontender, positive bowel sounds MSK no focal spinal tenderness, no peripheral edema and specifically no right upper extremity lymphedema Neuro: Nonfocal; well-oriented; anxious affect Breasts: The right breast is status post lumpectomy and radiation. The right axilla shows no palpable mass. The incisions have healed very nicely. Left breast is unremarkable   LAB RESULTS: Lab Results  Component Value Date   WBC 6.3 06/07/2013   NEUTROABS 3.2 06/07/2013   HGB 11.9 06/07/2013   HCT 35.7 06/07/2013   MCV 81.4 06/07/2013   PLT 240 06/07/2013      Chemistry      Component Value Date/Time   NA 140 06/07/2013 1052   NA 138 09/18/2012 1531   K 4.0 06/07/2013 1052   K 3.7 09/18/2012 1531   CL 106 03/21/2013 1422   CL 103 09/18/2012 1531   CO2 22 06/07/2013  1052   CO2 23 09/18/2012 1531   BUN 11.2 06/07/2013 1052   BUN 9 09/18/2012 1531   CREATININE 0.8 06/07/2013 1052   CREATININE 0.70 09/18/2012 1531   CREATININE 0.74 03/01/2012 1715      Component Value Date/Time   CALCIUM 9.5 06/07/2013 1052   CALCIUM 9.8 09/18/2012 1531   ALKPHOS 89 06/07/2013 1052   ALKPHOS 81 09/18/2012 1531   AST 21 06/07/2013 1052   AST 18 09/18/2012 1531   ALT 19 06/07/2013 1052   ALT 13 09/18/2012 1531   BILITOT 0.36 06/07/2013 1052   BILITOT 0.3 09/18/2012 1531       Lab Results  Component Value Date   LABCA2 27 12/27/2012     STUDIES: Ct Chest W Contrast  07/03/2013   *RADIOLOGY REPORT*  Clinical Data: Follow-up metastatic breast cancer with chemotherapy completed February 2014  CT CHEST WITH CONTRAST  Technique:  Multidetector CT imaging of the chest was performed following the standard protocol during bolus administration of intravenous contrast.  Contrast: 80mL OMNIPAQUE IOHEXOL 300 MG/ML  SOLN  Comparison: 03/13/2013  Findings: Subpleural interstitial change anterolateral right upper lobe.  This is stable.  On image 31, there is an 11mm anterior right middle lobe nodule.  Right middle lobe pulmonary nodule image number 33 in the inferior right middle lobe, which measures 5 mm. Further inferiorly in the right middle lobe is a 3 mm pulmonary nodule.  Posteriorly in the right lung base is another 3 mm pulmonary nodule on image number 41.  Ill-defined nodule right lower lobe image number 35, there is a 7 mm pulmonary nodule.  On the left, there is a pulmonary nodule in the lingula on image 20, measuring 5 mm.  There is another lingular nodule measuring 11 mm on image number 22.  There is a spiculated nodule in the left lower lobe medially on image number 35, measuring 12 x 10 mm.  Comparison with the prior study is difficult as it was a PET CT with different technique for CT scanning and there are no recent prior CTs of the thorax.  The dominant nodule in the lingula appears  larger when compared to the prior study.  The dominant nodule in the left lower lobe appears stable to slightly larger. Most of the other nodules described for this study are not seen on the prior exam, but that  does not necessarily indicate that they are all new since they are all rather small and there are interval appearance or apparent enlargement may be due to differences in technique between the examinations.  However, the 11mm nodule in the right middle lobe on image 31 does appear to be new.  There are large right axillary lymph node described on the prior study is either resolved or excised.  Small bilateral axillary lymph nodes appear otherwise stable.  There are small aortopulmonary window lymph nodes, which are stable.  There is an azygos vein node on image number 14, which is 10 mm in short axis diameter and which is stable.  The right subcarinal lymph node measures 13 mm in short axis, stable.  There are no pleural effusions.  Scans through the upper abdomen are negative. No definite focal metastatic lesions identified on this examination, although there is some heterogeneity to the marrow of the thoracic spine, which is nonspecific, but bone metastasis could have this appearance.  IMPRESSION:  1.  Multiple bilateral pulmonary nodules concerning for metastatic disease.  It is difficult to compared to the prior PET CT images but at least a few of the larger lesions appear new or enlarged. 2.  Stable mediastinal adenopathy.  Previously noted enlarged right axillary lymph node not identified currently.   Original Report Authenticated By: Esperanza Heir, M.D.    ASSESSMENT: 62 y.o. [place] woman originally from [place] with stage IV breast cancer:  (1) Status post right lumpectomy and sentinel lymph node sampling 05/16/2001 for a pT1c pN1, stage IIA invasive ductal carcinoma, grade 2, estrogen receptor 95% and progesterone receptor 96% positive, with an MIB-1 of 11% and no HER-2 amplification by  immunocytochemistry.  (2) Treated adjuvantly with cyclophosphamide, methotrexate and fluorouracil, likely for 8 cycles (data unavailable), completed February 2003.  (3) Completed radiation to the right breast and regional lymph nodes 02/12/2002.  (4) The patient apparently refused completion axillary lymph node dissection and antiestrogen therapy (data unavailable).  (5) Recurrent disease documented by right axillary lymph node biopsy 08/23/2012, showing a high-grade invasive ductal carcinoma which was estrogen and progesterone receptor negative, with an MIB-1 of 59%, and no HER-2 amplification; staging studies showed hyper metabolic mediastinal and left lung lesions, with no involvement of the liver or brain  (6) Abraxane every 2 weeks started 09/20/2012, with good tolerance and evidence of response, completing 12 doses 02/21/2013.  (7) restaging studies at [facility] March 2014 and here April-May 2014 as follows  (a) bone involvement: [facility] bone scan showed focal tracer activity in the left acetabulum, left 12th rib posteriorly, and right sixth rib posteriorly; on CT scan a 9 mm sclerotic sacral lesion was new as compared to November of 2013.  (b) [facility] CT of the chest, abdomen and pelvis showed slight decrease in size of some mediastinal lymph nodes, persistent unchanged nodularity in the right axilla, normal liver, and no new or enlarging pulmonary nodules  (c) MRI of the cervical spine shows no obvious lesion to explain the patient's Right arm symptoms  (d) PET scan shows multiple bone lesions (including C7), mild growth in index lymph nodes, no change in possible left lung nodules, and no liver involvement  (7) genetic testing obtained at [facility] 03/19/2013, results pending  (8) zolendronate started 02/21/2013, continuing monthly   (9) nonspecific myositis RUE documented at [facility] by [facility] March 2014; cervical MRI DOES not explain symptoms, which are likely due to a right axillary radiculopathy  (10)  status post right axillary lymph node  dissection 05/15/2013, special studies showing the 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 Debbie Larsen has her fingers in many pops. She is planning to see Dr. Amie Critchley later this month. She is also going to be seeing Dr. Avis Epley at [facility] area she requested to see Dr. Jerolyn Center, our lung specialist, because she has lung metastases and thought he would give her Tarceva for that. She is requesting to see Dr. Welton Flakes because she is from [place] and "perhaps you know something about people like [place]". She is also requesting that we open some studies here that could make use of her genetic findings, or alternatively a request a special release of Tarceva so that it can be used in her situation.  While she explores all these possibilities, I think she really needs to start treatment, and though initially she had told me she did not want to go back to Abraxane, today she was anxious to do so. This is a drug that she tolerated well and had evidence of response. She does not want to receive the zoledronic acid at the same time as the Abraxane, so she will receive zolendronate today and start her every two-week Abraxane next week.  She would like to be seen with every treatment, and specifically would like to see me each treatment. I explained that is not going to be possible because of my schedule is so carotid but she can't see one of my physician's assistants and those appointments have been answered. I will followup with our research team to see if we can find a study she might be able to set that participate in that we could open here. In the meantime I continue to encourage her to keep her contacts up at [facility] and [facility] in case something "up there. (She does understand that starting treatment here will at the very least delay her participation in a study).  At this point  the plan is to continue Abraxane every 2 weeks for at least 3 months before repeating a CT scan of the chest, to review the response in the measurable disease in her lungs. The bone metastases were noted by PET scan at [facility], and we could consider repeating a PET scan here at some point, although the presumption would be that if we get a response in the lungs there would be a response in the bone as well.  She knows to call for any problems that may develop before her next visit here.    Lowella Dell, MD  07/09/2013

## 2013-07-10 ENCOUNTER — Telehealth: Payer: Self-pay | Admitting: *Deleted

## 2013-07-10 MED ORDER — ONDANSETRON HCL 4 MG PO TABS: ORAL_TABLET | ORAL | Status: DC

## 2013-07-10 MED ORDER — PROCHLORPERAZINE MALEATE 5 MG PO TABS: 5.0000 mg | ORAL_TABLET | Freq: Four times a day (QID) | ORAL | Status: DC | PRN

## 2013-07-10 NOTE — Telephone Encounter (Signed)
Per staff message and POF I have scheduled appts.  JMW  

## 2013-07-11 ENCOUNTER — Telehealth: Payer: Self-pay | Admitting: *Deleted

## 2013-07-11 ENCOUNTER — Telehealth: Payer: Self-pay | Admitting: Oncology

## 2013-07-11 ENCOUNTER — Ambulatory Visit: Payer: Medicaid Other

## 2013-07-11 ENCOUNTER — Other Ambulatory Visit: Payer: Medicaid Other | Admitting: Lab

## 2013-07-11 NOTE — Telephone Encounter (Signed)
Per staff message and POF I have scheduled appts. I tired to schedule appt for 9/30, MD appt to late in the afternoon. Scheduler advised to move appt  JMW

## 2013-07-11 NOTE — Telephone Encounter (Signed)
Faxed pt medical records to [facility]   [phone]

## 2013-07-11 NOTE — Telephone Encounter (Signed)
THIS INFORMATION GIVEN TO DR.MAGRINAT'S NURSE, AMY MITCHELL,RN.

## 2013-07-11 NOTE — Telephone Encounter (Signed)
Patient calling to change appt on 9/16 to afternoon appt on Wednesday 9/17 afternoon appt. She has rehab on Tuesday and does not like morning appts.

## 2013-07-12 ENCOUNTER — Encounter: Payer: Self-pay | Admitting: *Deleted

## 2013-07-12 ENCOUNTER — Telehealth: Payer: Self-pay | Admitting: *Deleted

## 2013-07-12 NOTE — Progress Notes (Signed)
Sent request for imaging from radiology and BCG to be sent to [facility] breast center.  Pt will be considered for study.  Sent request for MR to be sent as well.  This is my third request.  Pathology slides and reports have already been received from first request made.

## 2013-07-12 NOTE — Telephone Encounter (Signed)
I have called the patient to move appt. She wants to keep Tuesday. Informed her that her other appt needs to be moved first. Patient to call me back when moved.   JMW

## 2013-07-15 ENCOUNTER — Telehealth: Payer: Self-pay | Admitting: *Deleted

## 2013-07-15 NOTE — Telephone Encounter (Signed)
Patietn called and moved her appt tomorrow from the morning to the afternoon.  JMW

## 2013-07-15 NOTE — Telephone Encounter (Signed)
Per staff message and POF I have scheduled appts.  JMW  

## 2013-07-16 ENCOUNTER — Ambulatory Visit: Payer: Medicaid Other | Admitting: Physical Therapy

## 2013-07-16 ENCOUNTER — Ambulatory Visit (HOSPITAL_BASED_OUTPATIENT_CLINIC_OR_DEPARTMENT_OTHER): Payer: Medicaid Other

## 2013-07-16 VITALS — BP 111/69 | HR 90 | Temp 98.1°F | Resp 18

## 2013-07-16 DIAGNOSIS — Z5111 Encounter for antineoplastic chemotherapy: Secondary | ICD-10-CM

## 2013-07-16 DIAGNOSIS — C773 Secondary and unspecified malignant neoplasm of axilla and upper limb lymph nodes: Secondary | ICD-10-CM

## 2013-07-16 DIAGNOSIS — C50219 Malignant neoplasm of upper-inner quadrant of unspecified female breast: Secondary | ICD-10-CM

## 2013-07-16 DIAGNOSIS — C7951 Secondary malignant neoplasm of bone: Secondary | ICD-10-CM

## 2013-07-16 DIAGNOSIS — C50919 Malignant neoplasm of unspecified site of unspecified female breast: Secondary | ICD-10-CM

## 2013-07-16 MED ORDER — ONDANSETRON 8 MG/NS 50 ML IVPB
INTRAVENOUS | Status: AC
  Filled 2013-07-16: qty 8

## 2013-07-16 MED ORDER — SODIUM CHLORIDE 0.9 % IV SOLN
Freq: Once | INTRAVENOUS | Status: AC
  Administered 2013-07-16: 15:00:00 via INTRAVENOUS

## 2013-07-16 MED ORDER — ONDANSETRON 8 MG/50ML IVPB (CHCC)
8.0000 mg | Freq: Once | INTRAVENOUS | Status: AC
  Administered 2013-07-16: 8 mg via INTRAVENOUS

## 2013-07-16 MED ORDER — SODIUM CHLORIDE 0.9 % IJ SOLN
10.0000 mL | INTRAMUSCULAR | Status: DC | PRN
  Administered 2013-07-16: 10 mL
  Filled 2013-07-16: qty 10

## 2013-07-16 MED ORDER — DEXAMETHASONE SODIUM PHOSPHATE 10 MG/ML IJ SOLN
INTRAMUSCULAR | Status: AC
  Filled 2013-07-16: qty 1

## 2013-07-16 MED ORDER — PACLITAXEL PROTEIN-BOUND CHEMO INJECTION 100 MG
100.0000 mg/m2 | Freq: Once | INTRAVENOUS | Status: AC
  Administered 2013-07-16: 150 mg via INTRAVENOUS
  Filled 2013-07-16: qty 30

## 2013-07-16 MED ORDER — DEXAMETHASONE SODIUM PHOSPHATE 10 MG/ML IJ SOLN
10.0000 mg | Freq: Once | INTRAMUSCULAR | Status: AC
  Administered 2013-07-16: 10 mg via INTRAVENOUS

## 2013-07-16 MED ORDER — HEPARIN SOD (PORK) LOCK FLUSH 100 UNIT/ML IV SOLN
500.0000 [IU] | Freq: Once | INTRAVENOUS | Status: AC | PRN
  Administered 2013-07-16: 500 [IU]
  Filled 2013-07-16: qty 5

## 2013-07-16 NOTE — Patient Instructions (Addendum)
[facility] Discharge Instructions for Patients Receiving Chemotherapy  Today you received the following chemotherapy agents Abraxane  To help prevent nausea and vomiting after your treatment, we encourage you to take your nausea medication as prescribed.   If you develop nausea and vomiting that is not controlled by your nausea medication, call the clinic.   BELOW ARE SYMPTOMS THAT SHOULD BE REPORTED IMMEDIATELY:  *FEVER GREATER THAN 100.5 F  *CHILLS WITH OR WITHOUT FEVER  NAUSEA AND VOMITING THAT IS NOT CONTROLLED WITH YOUR NAUSEA MEDICATION  *UNUSUAL SHORTNESS OF BREATH  *UNUSUAL BRUISING OR BLEEDING  TENDERNESS IN MOUTH AND THROAT WITH OR WITHOUT PRESENCE OF ULCERS  *URINARY PROBLEMS  *BOWEL PROBLEMS  UNUSUAL RASH Items with * indicate a potential emergency and should be followed up as soon as possible.  Feel free to call the clinic you have any questions or concerns. The clinic phone number is [phone].    

## 2013-07-22 ENCOUNTER — Ambulatory Visit: Payer: Medicaid Other | Attending: Surgery | Admitting: Physical Therapy

## 2013-07-22 DIAGNOSIS — M24519 Contracture, unspecified shoulder: Secondary | ICD-10-CM | POA: Insufficient documentation

## 2013-07-22 DIAGNOSIS — IMO0001 Reserved for inherently not codable concepts without codable children: Secondary | ICD-10-CM | POA: Insufficient documentation

## 2013-07-22 DIAGNOSIS — Z853 Personal history of malignant neoplasm of breast: Secondary | ICD-10-CM | POA: Insufficient documentation

## 2013-07-22 DIAGNOSIS — I890 Lymphedema, not elsewhere classified: Secondary | ICD-10-CM | POA: Insufficient documentation

## 2013-07-23 ENCOUNTER — Other Ambulatory Visit: Payer: Self-pay | Admitting: *Deleted

## 2013-07-23 ENCOUNTER — Encounter: Payer: Medicaid Other | Admitting: Physical Therapy

## 2013-07-23 ENCOUNTER — Encounter: Payer: Self-pay | Admitting: Specialist

## 2013-07-23 NOTE — Progress Notes (Signed)
I spoke with Debbie Larsen by phone regarding her participation in [facility].  She came unregistered last night to [facility], even though that program specifically addresses the needs of patients who have completed treatment.  I explained to Debbie Larsen that because she still is in treatment that [facility] is not appropriate for her.  She expressed an interest in coming to the new program, [facility], which begins in October.

## 2013-07-25 ENCOUNTER — Encounter: Payer: Self-pay | Admitting: Oncology

## 2013-07-27 ENCOUNTER — Other Ambulatory Visit: Payer: Self-pay | Admitting: Oncology

## 2013-07-27 NOTE — Progress Notes (Signed)
Dr Amie Critchley from [facility] contacted me re SS--very concerned because patient did not revel she was already under treatment. This only came out from review of our records. Dr Amie Critchley and her resident had each spent 45 min with Debbie Larsen by that time. Dr Amie Critchley has dismissed the patient. In any case she pointed out Debbie Larsen does not qualify for any Phase 1 studies ([place] medicaid law). Dr Amie Critchley does not recommend tarceva or orther EGFR inbibitors for this pt in the absence of a trial-- a recommendation with which I agree  Also heard from Dr Avis Epley at [facility]-- theydo not have a trial at present for [facility] and she understood the pt to state she would not want to be treated that far away from home.

## 2013-07-30 ENCOUNTER — Other Ambulatory Visit: Payer: Medicaid Other | Admitting: Lab

## 2013-07-30 ENCOUNTER — Ambulatory Visit: Payer: Medicaid Other | Admitting: Family

## 2013-07-30 ENCOUNTER — Ambulatory Visit (HOSPITAL_BASED_OUTPATIENT_CLINIC_OR_DEPARTMENT_OTHER): Payer: Medicaid Other | Admitting: Family

## 2013-07-30 ENCOUNTER — Encounter: Payer: Self-pay | Admitting: Family

## 2013-07-30 ENCOUNTER — Ambulatory Visit (HOSPITAL_BASED_OUTPATIENT_CLINIC_OR_DEPARTMENT_OTHER): Payer: Medicaid Other

## 2013-07-30 ENCOUNTER — Other Ambulatory Visit (HOSPITAL_BASED_OUTPATIENT_CLINIC_OR_DEPARTMENT_OTHER): Payer: Medicaid Other | Admitting: Lab

## 2013-07-30 ENCOUNTER — Telehealth: Payer: Self-pay | Admitting: *Deleted

## 2013-07-30 ENCOUNTER — Ambulatory Visit: Payer: Medicaid Other | Admitting: Oncology

## 2013-07-30 ENCOUNTER — Encounter: Payer: Medicaid Other | Admitting: Physical Therapy

## 2013-07-30 DIAGNOSIS — Z5111 Encounter for antineoplastic chemotherapy: Secondary | ICD-10-CM

## 2013-07-30 DIAGNOSIS — C50219 Malignant neoplasm of upper-inner quadrant of unspecified female breast: Secondary | ICD-10-CM

## 2013-07-30 DIAGNOSIS — C7951 Secondary malignant neoplasm of bone: Secondary | ICD-10-CM

## 2013-07-30 DIAGNOSIS — C50919 Malignant neoplasm of unspecified site of unspecified female breast: Secondary | ICD-10-CM

## 2013-07-30 DIAGNOSIS — R748 Abnormal levels of other serum enzymes: Secondary | ICD-10-CM

## 2013-07-30 LAB — CBC WITH DIFFERENTIAL/PLATELET
EOS%: 0.7 % (ref 0.0–7.0)
MCH: 27.2 pg (ref 25.1–34.0)
MCV: 81.5 fL (ref 79.5–101.0)
MONO%: 14.1 % — ABNORMAL HIGH (ref 0.0–14.0)
NEUT#: 3 10*3/uL (ref 1.5–6.5)
RBC: 4.48 10*6/uL (ref 3.70–5.45)
RDW: 14.2 % (ref 11.2–14.5)
nRBC: 0 % (ref 0–0)

## 2013-07-30 LAB — COMPREHENSIVE METABOLIC PANEL (CC13)
ALT: 32 U/L (ref 0–55)
Alkaline Phosphatase: 145 U/L (ref 40–150)
Sodium: 136 mEq/L (ref 136–145)
Total Bilirubin: 0.35 mg/dL (ref 0.20–1.20)
Total Protein: 7.5 g/dL (ref 6.4–8.3)

## 2013-07-30 MED ORDER — ONDANSETRON 8 MG/50ML IVPB (CHCC)
8.0000 mg | Freq: Once | INTRAVENOUS | Status: AC
  Administered 2013-07-30: 8 mg via INTRAVENOUS

## 2013-07-30 MED ORDER — SODIUM CHLORIDE 0.9 % IV SOLN
Freq: Once | INTRAVENOUS | Status: AC
  Administered 2013-07-30: 13:00:00 via INTRAVENOUS

## 2013-07-30 MED ORDER — DEXAMETHASONE SODIUM PHOSPHATE 10 MG/ML IJ SOLN
INTRAMUSCULAR | Status: AC
  Filled 2013-07-30: qty 1

## 2013-07-30 MED ORDER — HEPARIN SOD (PORK) LOCK FLUSH 100 UNIT/ML IV SOLN
500.0000 [IU] | Freq: Once | INTRAVENOUS | Status: AC | PRN
  Administered 2013-07-30: 500 [IU]
  Filled 2013-07-30: qty 5

## 2013-07-30 MED ORDER — SODIUM CHLORIDE 0.9 % IJ SOLN
10.0000 mL | INTRAMUSCULAR | Status: DC | PRN
  Administered 2013-07-30: 10 mL
  Filled 2013-07-30: qty 10

## 2013-07-30 MED ORDER — ONDANSETRON 8 MG/NS 50 ML IVPB
INTRAVENOUS | Status: AC
  Filled 2013-07-30: qty 8

## 2013-07-30 MED ORDER — PACLITAXEL PROTEIN-BOUND CHEMO INJECTION 100 MG
100.0000 mg/m2 | Freq: Once | INTRAVENOUS | Status: AC
  Administered 2013-07-30: 150 mg via INTRAVENOUS
  Filled 2013-07-30: qty 30

## 2013-07-30 MED ORDER — DEXAMETHASONE SODIUM PHOSPHATE 10 MG/ML IJ SOLN
10.0000 mg | Freq: Once | INTRAMUSCULAR | Status: AC
  Administered 2013-07-30: 10 mg via INTRAVENOUS

## 2013-07-30 NOTE — Patient Instructions (Addendum)
Please contact us at [phone] if you have any questions or concerns.  Please continue to do well and enjoy life!!!  Get plenty of rest, drink plenty of water, exercise daily, eat a balanced diet.  Take Caltrate 600 mg daily and Vitamin D3 1000 IUs daily.   Please contact us at [phone] if you have any questions or concerns.  Complete monthly self-breast examinations.  Results for orders placed in visit on 07/30/13 (from the past 24 hour(s))  CBC WITH DIFFERENTIAL     Status: Abnormal   Collection Time    07/30/13 11:10 AM      Result Value Range   WBC 5.4  3.9 - 10.3 10e3/uL   NEUT# 3.0  1.5 - 6.5 10e3/uL   HGB 12.2  11.6 - 15.9 g/dL   HCT 16.1  09.6 - 04.5 %   Platelets 226  145 - 400 10e3/uL   MCV 81.5  79.5 - 101.0 fL   MCH 27.2  25.1 - 34.0 pg   MCHC 33.4  31.5 - 36.0 g/dL   RBC 4.09  8.11 - 9.14 10e6/uL   RDW 14.2  11.2 - 14.5 %   lymph# 1.6  0.9 - 3.3 10e3/uL   MONO# 0.8  0.1 - 0.9 10e3/uL   Eosinophils Absolute 0.0  0.0 - 0.5 10e3/uL   Basophils Absolute 0.0  0.0 - 0.1 10e3/uL   NEUT% 54.8  38.4 - 76.8 %   LYMPH% 29.8  14.0 - 49.7 %   MONO% 14.1 (*) 0.0 - 14.0 %   EOS% 0.7  0.0 - 7.0 %   BASO% 0.6  0.0 - 2.0 %   nRBC 0  0 - 0 %   Narrative:    Performed At:  [facility]               [address]               [place], [place] [address]    [facility] [address] [place], [place] [address] Office: [phone] FAX: [phone]-fax TTY: [phone]

## 2013-07-30 NOTE — Telephone Encounter (Signed)
Per patient request I have rescheduled her appts for 10/7 to the afternoon. I have also rescheduled the 10/14 appts to the afternoon. Per the patient she wishes to move her MD visit also. I have told her that the  desk RN will have to call her. Desk RN notified.  JMW

## 2013-07-30 NOTE — Progress Notes (Signed)
[facility]  Telephone:([phone]) [phone] Fax:([phone]) [phone]  OFFICE PROGRESS NOTE    ID: Debbie Larsen   DOB: November 16, 1950  MR#: [id number]  JYN#:[id number]    PCP: Rodman Pickle, MD SU: [name], MD CARDIO: Nicholes Mango, MD RAD ONC: [name], MD   [facility] HEME/ONC:  Kandice Hams, MD NEURO:  Maeola Harman, MD   HISTORY OF PRESENT ILLNESS: The patient's 2002-2004 records have been retrieved. The patient underwent a right breast core biopsy 03/29/2001 for an invasive ductal carcinoma associated with necrosis ([id number]).  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 The tumor was estrogen receptor 95% positive, progesterone receptor 96% positive, with an MIB-1 of 11%, and no HER-2 amplification by the HercepTest. She completed 6 cycles of CMF chemotherapy on 12/12/2001. There were minor delays, but no dose reductions. She received radiation to the right breast and regional lymph nodes to a total of 5040 cGy with a right breast boost of 12 [name], completed 02/12/2002  More recently, "Debbie Larsen" develop some right shoulder pain sometime in late 2012. She had followup mammograms of until 2011. There was no mammogram performed in 2012. She was seen by physical therapy in orthopedic surgery for evaluation of her right shoulder pain. She had a mammogram on 07/24/2012 which showed a suspicious mass behind eczema of the right breast. Physical exam the time did show multiple involved right axillary lymph nodes. 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 HER-2 nonamplified the ratio by [facility] being 1.43. Proliferative index was  50%.   MRI scan of both breasts performed on 08/31/2012 showed multiple involved lymph nodes in the right axilla, no obvious  masses in either breast. Unfortunately staging PET scan 09/12/2012 showed in addition to her right axillary recurrence, bilateral mediastinal and left lung nodules consistent with stage IV disease. Her subsequent history is as detailed below.  INTERVAL HISTORY: Debbie Larsen returns today for followup of her metastatic breast cancer.  Her interval history is significant for being seen by Dr. Kandice Hams at [facility] on 07/26/2013.  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  She was found not to be eligible for any studies at [facility] at this time.  Also Dr. Avis Epley at [facility] stated they do not have a trial of present for Debbie Larsen.  Her interval history is otherwise unremarkable.   REVIEW OF SYSTEMS: A 10 point review of systems was completed and is negative except for chronic right upper extremity neuropathy.  She is also complaining of the onset of right lower quadrant pain.  Debbie Larsen denies any other symptomatology including fatigue, fever or chills, headache, vision changes, swollen glands, cough or shortness of breath, chest pain or discomfort, nausea, vomiting, diarrhea, constipation, change in urinary or bowel habits, any other arthralgias/myalgias, unusual bleeding/bruising or any other symptomatology.  A detailed review of systems today was entirely stable.   PAST MEDICAL HISTORY: Past Medical History  Diagnosis Date  . 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 Hypothyroid   . Enlarged lymph nodes   . BREAST CANCER 04/2001    s/p radiation therapy, chemotherapy and lumpectomy  . Abnormal EMG 07/2012    R axillary neuropathy w/denervation teres minor and  deltoid. 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  . S/P radiation therapy     Right breast/regional lymph nodes/ 5040 cGy / 28 fractions/ bost of 1200 cGy/ 6 Fractions  . Status post chemotherapy     6 cycles of CMF completed on 12/12/2001  . Right shoulder pain      started late 2012  . Dry cough     intermittent  . SOB (shortness of breath)   . Use of letrozole (Femara) 03/14/2013  . Metastasis to bone 03/13/13    PET scan results  . Metastasis to bone     breast primary    PAST SURGICAL HISTORY: Past Surgical History  Procedure Laterality Date  . Tonsillectomy    . Breast lumpectomy  04/2001    excision of L breast fibroadenoma by Dr.   . Breast lumpectomy  05/16/2001    Right axillary sentinel lymph node biopsy.  . Portacath placement  09/19/2012    Procedure: INSERTION PORT-A-CATH;  Surgeon: [name], MD;  Location: [facility] OR;  Service: General;  Laterality: N/A;  . Lipoma excision Right 05/15/2013    Procedure: RIGHT AXILLARY LYMPH NODE DISSECTION;  Surgeon: [name], MD;  Location: [facility] ORS;  Service: General;  Laterality: Right;    FAMILY HISTORY Family History  Problem Relation Age of Onset  . Diabetes Mother   . Hypertension Father   . Hypertension Sister   . Hypothyroidism Brother   . Cancer Neg Hx   The patient's father died from heart disease at the age of 81. The patient's mother died in 07/12/12 at the age of 86 also from heart disease. The patient has 2 sisters and 2 brothers. There is no history of breast or ovarian cancer in the family to her knowledge.   GYNECOLOGIC HISTORY: G1 P1, first live birth age 62. She underwent menopause at the time of her chemotherapy in 2002. She did not use hormone replacement.   SOCIAL HISTORY: The patient is divorced and lives by herself. Her son Debbie Larsen lives in [place], where he works as an Publishing copy. The patient tells me her son lives with 3 roommates" there is no room for me there". (Accordingly going to [facility] for studies is not an option for her). The patient has no grandchildren. She attends the local [employer].    ADVANCED DIRECTIVES: The patient tells me (11/01/2012) her healthcare power of attorney is her sister Debbie Larsen. She was unable to  give me her contact information.   HEALTH MAINTENANCE: History  Substance Use Topics  . Smoking status: Never Smoker   . Smokeless tobacco: Never Used  . Alcohol Use: No     Colonoscopy: None file  PAP: 08/17/2012  Bone density: The patient's last bone density scan on 07/24/2012 showed a T score of -1.7 (osteopenia).  Lipid panel: Not on file   No Known Allergies  Current Outpatient Prescriptions  Medication Sig Dispense Refill  . B Complex-C (B-COMPLEX WITH VITAMIN C) tablet Take 1 tablet by mouth daily.      . calcium-vitamin D (OSCAL WITH D) 500-200 MG-UNIT per tablet Take 1 tablet by mouth daily with breakfast.      . levothyroxine (SYNTHROID, LEVOTHROID) 75 MCG tablet Take 75 mcg by mouth every evening.      . lidocaine-prilocaine (EMLA) cream Apply topically as needed. Apply to port 1-2 hours before procedure  30 g  0  . Multiple Vitamin (MULTIVITAMIN WITH MINERALS) TABS Take 1 tablet by mouth  daily.      . ondansetron (ZOFRAN) 4 MG tablet Take one tablet twice daily (8 AM and 6 PM) beginning the evening of chemotherapy and continuing for 2 days  30 tablet  4  . prochlorperazine (COMPAZINE) 5 MG tablet Take 1 tablet (5 mg total) by mouth every 6 (six) hours as needed for nausea (take before meals and at bedtime starting the evening of chemotherapy and contune for 2 days).  30 tablet  6   No current facility-administered medications for this visit.   Facility-Administered Medications Ordered in Other Visits  Medication Dose Route Frequency Provider Last Rate Last Dose  . heparin lock flush 100 unit/mL  500 Units Intravenous Once Lowella Dell, MD      . sodium chloride 0.9 % injection 10 mL  10 mL Intravenous PRN Lowella Dell, MD      . sodium chloride 0.9 % injection 10 mL  10 mL Intravenous PRN Lowella Dell, MD   10 mL at 07/09/13 1717   Objective: Middle-aged [place] Asian woman in no acute distress: Filed Vitals:   07/30/13 1139  BP: 100/65  Pulse: 88    Temp: 97.7 F (36.5 C)  Resp: 20   Filed Vitals:   07/30/13 1139  Height: 5\' 1"  (1.549 m)  Weight: 118 lb 6.4 oz (53.706 kg)     General appearance: Alert, cooperative, well nourished, no apparent distress  Head: Normocephalic, without obvious abnormality, atraumatic  Eyes: Conjunctivae/corneas clear, PERRLA, EOMI  Nose: Nares, septum and mucosa are normal, no drainage or sinus tenderness.  Neck: No adenopathy, supple, symmetrical, trachea midline, thyroid not enlarged, no tenderness  Resp: Clear to auscultation bilaterally  Cardio: Regular rate and rhythm, S1, S2 normal, no murmur, click, rub or gallop, left chest Port-A-Cath covered EMLA cream with an occlusive dressing.   Breasts: Deferred  GI: Soft, distended, non-tender to palpation, hypoactive bowel sounds Skin: No rashes/lesions, skin warm and dry, no erythematous areas, no cyanosis  M/S:  Atraumatic, normal strength in all extremities, normal range of motion, no clubbing  Lymph nodes: Cervical, supraclavicular, and axillary nodes normal Neurologic: Grossly normal, cranial nerves II through XII intact, alert and oriented x 3 Psych: Appropriate affect   LAB RESULTS: Lab Results  Component Value Date   WBC 5.4 07/30/2013   NEUTROABS 3.0 07/30/2013   HGB 12.2 07/30/2013   HCT 36.5 07/30/2013   MCV 81.5 07/30/2013   PLT 226 07/30/2013      Chemistry      Component Value Date/Time   NA 136 07/30/2013 1110   NA 138 09/18/2012 1531   K 4.1 07/30/2013 1110   K 3.7 09/18/2012 1531   CL 106 03/21/2013 1422   CL 103 09/18/2012 1531   CO2 22 07/30/2013 1110   CO2 23 09/18/2012 1531   BUN 9.1 07/30/2013 1110   BUN 9 09/18/2012 1531   CREATININE 0.7 07/30/2013 1110   CREATININE 0.70 09/18/2012 1531   CREATININE 0.74 03/01/2012 1715      Component Value Date/Time   CALCIUM 9.2 07/30/2013 1110   CALCIUM 9.8 09/18/2012 1531   ALKPHOS 145 07/30/2013 1110   ALKPHOS 81 09/18/2012 1531   AST 51* 07/30/2013 1110   AST 18 09/18/2012 1531    ALT 32 07/30/2013 1110   ALT 13 09/18/2012 1531   BILITOT 0.35 07/30/2013 1110   BILITOT 0.3 09/18/2012 1531      Lab Results  Component Value Date   LABCA2 27 12/27/2012  STUDIES: No results found.  ASSESSMENT: 63 y.o. [place] woman originally from [place] with stage IV breast cancer:  (1) Status post right lumpectomy and sentinel lymph node sampling 05/16/2001 for a pT1c pN1, stage IIA invasive ductal carcinoma, grade 2, estrogen receptor 95% and progesterone receptor 96% positive, with an MIB-1 of 11% and no HER-2 amplification by immunocytochemistry.  (2) Treated adjuvantly with cyclophosphamide, methotrexate and fluorouracil, likely for 8 cycles (data unavailable), completed February 2003.  (3) Completed radiation to the right breast and regional lymph nodes 02/12/2002.  (4) The patient apparently refused axillary lymph node dissection and antiestrogen therapy (data unavailable).  (5) Recurrent disease documented by right axillary lymph node biopsy 08/23/2012, showing a high-grade invasive ductal carcinoma which was estrogen and progesterone receptor negative, with an MIB-1 of 59%, and no HER-2 amplification; staging studies showed hyper metabolic mediastinal and left lung lesions, with no involvement of the liver or brain  (6) Abraxane every 2 weeks started 09/20/2012, with good tolerance and evidence of response, completing 12 doses 02/21/2013.  (7)  Restaging studies at [facility] March 2014 and here April-May 2014 as follows:   (a) bone involvement: [facility] bone scan showed focal tracer activity in the left acetabulum, left 12th rib posteriorly, and right sixth rib posteriorly; on CT scan a 9 mm sclerotic sacral lesion was new as compared to November of 2013.  (b) [facility] CT of the chest, abdomen and pelvis showed slight decrease in size of some mediastinal lymph nodes, persistent unchanged nodularity in the right axilla, normal liver, and no new or enlarging pulmonary  nodules.  (c) MRI of the cervical spine shows no obvious lesion to explain the patient's Right arm symptoms.  (d) PET scan shows multiple bone lesions (including C7), mild growth in index lymph nodes, no change in possible left lung nodules, and no liver involvement  (7) Genetic testing obtained at [facility] 03/19/2013, results pending  (8) Zolendronate started 02/21/2013, continuing monthly   (9) Nonspecific myositis RUE documented at [facility] by EMG March 2014; cervical MRI does not explain symptoms, which are likely due to a right axillary radiculopathy.  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  (11) Elevated liver enzymes   PLAN:  Debbie Larsen will proceed to therapy with Abraxane today.  A MRI of the liver with contrast was ordered to evaluate elevated liver enzymes.  We plan to see Debbie Larsen again in 2 weeks at which time she will receive assessment for toxicity and laboratories.  MRI results will determine  future therapeutic agents, but for now therapy with Abraxane will continue.  [facility]bie Lars[facility] also [facility].  [facility].  All questions answered.  Debbie Larsen encouraged to contact us in the interim with any questions, concerns, or problems.  Larina Bras, NP-C 07/30/2013   6:36 PM

## 2013-07-30 NOTE — Patient Instructions (Addendum)
[facility] Discharge Instructions for Patients Receiving Chemotherapy  Today you received the following chemotherapy agents Abraxane  To help prevent nausea and vomiting after your treatment, we encourage you to take your nausea medication as prescribed.   If you develop nausea and vomiting that is not controlled by your nausea medication, call the clinic.   BELOW ARE SYMPTOMS THAT SHOULD BE REPORTED IMMEDIATELY:  *FEVER GREATER THAN 100.5 F  *CHILLS WITH OR WITHOUT FEVER  NAUSEA AND VOMITING THAT IS NOT CONTROLLED WITH YOUR NAUSEA MEDICATION  *UNUSUAL SHORTNESS OF BREATH  *UNUSUAL BRUISING OR BLEEDING  TENDERNESS IN MOUTH AND THROAT WITH OR WITHOUT PRESENCE OF ULCERS  *URINARY PROBLEMS  *BOWEL PROBLEMS  UNUSUAL RASH Items with * indicate a potential emergency and should be followed up as soon as possible.  Feel free to call the clinic you have any questions or concerns. The clinic phone number is [phone].    

## 2013-07-31 ENCOUNTER — Other Ambulatory Visit: Payer: Self-pay | Admitting: Family

## 2013-07-31 DIAGNOSIS — C7951 Secondary malignant neoplasm of bone: Secondary | ICD-10-CM

## 2013-07-31 DIAGNOSIS — R748 Abnormal levels of other serum enzymes: Secondary | ICD-10-CM

## 2013-08-06 ENCOUNTER — Ambulatory Visit (HOSPITAL_BASED_OUTPATIENT_CLINIC_OR_DEPARTMENT_OTHER): Payer: Medicaid Other

## 2013-08-06 ENCOUNTER — Other Ambulatory Visit: Payer: Medicaid Other | Admitting: Lab

## 2013-08-06 ENCOUNTER — Ambulatory Visit (HOSPITAL_BASED_OUTPATIENT_CLINIC_OR_DEPARTMENT_OTHER): Payer: Medicaid Other | Admitting: Family

## 2013-08-06 ENCOUNTER — Encounter: Payer: Self-pay | Admitting: Family

## 2013-08-06 ENCOUNTER — Other Ambulatory Visit: Payer: Self-pay | Admitting: Oncology

## 2013-08-06 VITALS — BP 131/77 | HR 101 | Temp 98.2°F | Resp 18 | Ht 60.0 in | Wt 119.7 lb

## 2013-08-06 DIAGNOSIS — M8580 Other specified disorders of bone density and structure, unspecified site: Secondary | ICD-10-CM

## 2013-08-06 DIAGNOSIS — R748 Abnormal levels of other serum enzymes: Secondary | ICD-10-CM

## 2013-08-06 DIAGNOSIS — C50219 Malignant neoplasm of upper-inner quadrant of unspecified female breast: Secondary | ICD-10-CM

## 2013-08-06 DIAGNOSIS — C7951 Secondary malignant neoplasm of bone: Secondary | ICD-10-CM

## 2013-08-06 DIAGNOSIS — Z170 Estrogen receptor positive status [ER+]: Secondary | ICD-10-CM

## 2013-08-06 DIAGNOSIS — C50919 Malignant neoplasm of unspecified site of unspecified female breast: Secondary | ICD-10-CM

## 2013-08-06 DIAGNOSIS — R1031 Right lower quadrant pain: Secondary | ICD-10-CM

## 2013-08-06 MED ORDER — ZOLEDRONIC ACID 4 MG/100ML IV SOLN
4.0000 mg | Freq: Once | INTRAVENOUS | Status: AC
  Administered 2013-08-06: 4 mg via INTRAVENOUS
  Filled 2013-08-06: qty 100

## 2013-08-06 MED ORDER — HEPARIN SOD (PORK) LOCK FLUSH 100 UNIT/ML IV SOLN
500.0000 [IU] | Freq: Once | INTRAVENOUS | Status: AC
  Administered 2013-08-06: 500 [IU] via INTRAVENOUS
  Filled 2013-08-06: qty 5

## 2013-08-06 MED ORDER — SODIUM CHLORIDE 0.9 % IV SOLN
Freq: Once | INTRAVENOUS | Status: AC
  Administered 2013-08-06: 17:00:00 via INTRAVENOUS

## 2013-08-06 MED ORDER — SODIUM CHLORIDE 0.9 % IJ SOLN
10.0000 mL | INTRAMUSCULAR | Status: DC | PRN
  Administered 2013-08-06: 10 mL via INTRAVENOUS
  Filled 2013-08-06: qty 10

## 2013-08-06 NOTE — Patient Instructions (Addendum)
Please contact us at [phone] if you have any questions or concerns.  Please continue to do well and enjoy life!!!  Get plenty of rest, drink plenty of water, exercise daily, eat a balanced diet.  Take Caltrate 600 mg daily and Vitamin D3 1000 IUs daily.   Please contact us at [phone] if you have any questions or concerns.  Complete monthly self-breast examinations.

## 2013-08-06 NOTE — Progress Notes (Addendum)
[facility]  Telephone:([phone]) [phone] Fax:(336) [phone]  OFFICE PROGRESS NOTE    ID: Debbie Larsen   DOB: 26-Mar-1951  MR#: [id number]  GMW#:[id number]    PCP: Debbie Pickle, Debbie Larsen SU: Debbie Paris, Debbie Larsen CARDIO: Debbie Mango, Debbie Larsen RAD ONC: Debbie Gottron, Debbie Larsen   [facility] HEME/ONC:  Debbie Hams, Debbie Larsen NEURO:  Debbie Harman, Debbie Larsen   HISTORY OF PRESENT ILLNESS: The patient's 2002-2004 records have been retrieved. The patient underwent a right breast core biopsy 03/29/2001 for an invasive ductal carcinoma associated with necrosis ([id number]).  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 The tumor was estrogen receptor 95% positive, progesterone receptor 96% positive, with an MIB-1 of 11%, and no HER-2 amplification by the HercepTest. She completed 6 cycles of CMF chemotherapy on 12/12/2001. There were minor delays, but no dose reductions. She received radiation to the right breast and regional lymph nodes to a total of 5040 cGy with a right breast boost of 12 Debbie Larsen, completed 02/12/2002  More recently, "Debbie Larsen" develop some right shoulder pain sometime in late 2012. She had followup mammograms of until 2011. There was no mammogram performed in 2012. She was seen by physical therapy in orthopedic surgery for evaluation of her right shoulder pain. She had a mammogram on 07/24/2012 which showed a suspicious mass behind eczema of the right breast. Physical exam the time did show multiple involved right axillary lymph nodes. 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 HER-2 nonamplified the ratio by CISH being 1.43. Proliferative index was  50%.   MRI scan of both breasts performed on 08/31/2012 showed multiple involved lymph nodes in the right axilla, no obvious  masses in either breast. Unfortunately staging PET scan 09/12/2012 showed in addition to her right axillary recurrence, bilateral mediastinal and left lung nodules consistent with stage IV disease. Her subsequent history is as detailed below.  INTERVAL HISTORY: Debbie Larsen returns today for followup of her metastatic breast cancer.   Her interval history is relatively unremarkable.  She did not obtain the ordered MRI of the liver and continues to have complaints of right lower quadrant pain.  Spoke to Debbie Larsen extensively about obtaining MRI before her next office visit, to enable her to discuss her full plan of care with Dr. Darnelle Larsen.  She states she is going to be visited by the [facility] program tomorrow.   REVIEW OF SYSTEMS: A 10 point review of systems was completed and is negative except for chronic right upper extremity neuropathy.  She is also has continued right lower quadrant pain.  Debbie Larsen denies any other symptomatology including fatigue, fever or chills, headache, vision changes, swollen glands, cough or shortness of breath, chest pain or discomfort, nausea, vomiting, diarrhea, constipation, change in urinary or bowel habits, any other arthralgias/myalgias, unusual bleeding/bruising or any other symptomatology.  A detailed review of systems today was entirely stable.   PAST MEDICAL HISTORY: Past Medical History  Diagnosis Date  . 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 Hypothyroid   . Enlarged lymph nodes   . BREAST CANCER 04/2001    s/p radiation therapy, chemotherapy and lumpectomy  . 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  . S/P radiation therapy  Right breast/regional lymph nodes/ 5040 cGy / 28 fractions/ bost of 1200 cGy/ 6 Fractions  . Status post chemotherapy     6 cycles of CMF completed on 12/12/2001  . Right shoulder pain     started late 2012  . Dry cough     intermittent  . SOB (shortness of  breath)   . Use of letrozole (Femara) 03/14/2013  . Metastasis to bone 03/13/13    PET scan results  . Metastasis to bone     breast primary    PAST SURGICAL HISTORY: Past Surgical History  Procedure Laterality Date  . Tonsillectomy    . Breast lumpectomy  04/2001    excision of L breast fibroadenoma by Dr.   . Breast lumpectomy  05/16/2001    Right axillary sentinel lymph node biopsy.  . Portacath placement  09/19/2012    Procedure: INSERTION PORT-A-CATH;  Surgeon: Debbie Paris, Debbie Larsen;  Location: [facility] OR;  Service: General;  Laterality: N/A;  . Lipoma excision Right 05/15/2013    Procedure: RIGHT AXILLARY LYMPH NODE DISSECTION;  Surgeon: Debbie Paris, Debbie Larsen;  Location: [facility] ORS;  Service: General;  Laterality: Right;    FAMILY HISTORY Family History  Problem Relation Age of Onset  . Diabetes Mother   . Hypertension Father   . Hypertension Sister   . Hypothyroidism Brother   . Cancer Neg Hx   The patient's father died from heart disease at the age of 34. The patient's mother died in 08-01-2012 at the age of 66 also from heart disease. The patient has 2 sisters and 2 brothers. There is no history of breast or ovarian cancer in the family to her knowledge.   GYNECOLOGIC HISTORY: G1 P1, first live birth age 25. She underwent menopause at the time of her chemotherapy in 2002. She did not use hormone replacement.   SOCIAL HISTORY: The patient is divorced and lives by herself. Her son Debbie Larsen lives in [place], where he works as an Publishing copy. The patient tells me her son lives with 3 roommates" there is no room for me there". (Accordingly going to [employer] for studies is not an option for her). The patient has no grandchildren. She attends the local [employer].    ADVANCED DIRECTIVES: The patient tells me (11/01/2012) her healthcare power of attorney is her sister Debbie Larsen. She was unable to give me her contact information.   HEALTH MAINTENANCE: History   Substance Use Topics  . Smoking status: Never Smoker   . Smokeless tobacco: Never Used  . Alcohol Use: No     Colonoscopy: None file  PAP: 08/17/2012  Bone density: The patient's last bone density scan on 07/24/2012 showed a T score of -1.7 (osteopenia).  Lipid panel: Not on file   No Known Allergies  Current Outpatient Prescriptions  Medication Sig Dispense Refill  . B Complex-C (B-COMPLEX WITH VITAMIN C) tablet Take 1 tablet by mouth daily.      . calcium-vitamin D (OSCAL WITH D) 500-200 MG-UNIT per tablet Take 1 tablet by mouth daily with breakfast.      . levothyroxine (SYNTHROID, LEVOTHROID) 75 MCG tablet Take 75 mcg by mouth every evening.      . lidocaine-prilocaine (EMLA) cream Apply topically as needed. Apply to port 1-2 hours before procedure  30 g  0  . Multiple Vitamin (MULTIVITAMIN WITH MINERALS) TABS Take 1 tablet by mouth daily.      . ondansetron (ZOFRAN) 4 MG tablet Take one tablet twice  daily (8 AM and 6 PM) beginning the evening of chemotherapy and continuing for 2 days  30 tablet  4  . prochlorperazine (COMPAZINE) 5 MG tablet Take 1 tablet (5 mg total) by mouth every 6 (six) hours as needed for nausea (take before meals and at bedtime starting the evening of chemotherapy and contune for 2 days).  30 tablet  6   No current facility-administered medications for this visit.   Facility-Administered Medications Ordered in Other Visits  Medication Dose Route Frequency Provider Last Rate Last Dose  . 0.9 %  sodium chloride infusion   Intravenous Once Lowella Dell, Debbie Larsen      . heparin lock flush 100 unit/mL  500 Units Intravenous Once Lowella Dell, Debbie Larsen      . sodium chloride 0.9 % injection 10 mL  10 mL Intravenous PRN Lowella Dell, Debbie Larsen      . sodium chloride 0.9 % injection 10 mL  10 mL Intravenous PRN Lowella Dell, Debbie Larsen   10 mL at 07/09/13 1717  . Zoledronic Acid (ZOMETA) 4 mg IVPB  4 mg Intravenous Once Lowella Dell, Debbie Larsen 400 mL/hr at 08/06/13 1708 4  mg at 08/06/13 1708   Objective: Middle-aged [place] Asian woman in no acute distress: Filed Vitals:   08/06/13 1538  BP: 131/77  Pulse: 101  Temp: 98.2 F (36.8 C)  Resp: 18   Filed Vitals:   08/06/13 1538  Height: 5' (1.524 m)  Weight: 119 lb 11.2 oz (54.296 kg)     General appearance: Alert, cooperative, well nourished, no apparent distress  Head: Normocephalic, without obvious abnormality, atraumatic  Eyes: Conjunctivae/corneas clear, PERRLA, EOMI  Nose: Nares, septum and mucosa are normal, no drainage or sinus tenderness.  Neck: No adenopathy, supple, symmetrical, trachea midline, thyroid not enlarged, no tenderness  Resp: Clear to auscultation bilaterally  Cardio: Regular rate and rhythm, S1, S2 normal, no murmur, click, rub or gallop, left chest Port-A-Cath covered EMLA cream with an occlusive dressing.   Breasts: Deferred  GI: Soft, distended, non-tender to palpation, hypoactive bowel sounds Skin: No rashes/lesions, skin warm and dry, no erythematous areas, no cyanosis  M/S:  Atraumatic, normal strength in all extremities, normal range of motion, no clubbing  Lymph nodes: Cervical, supraclavicular, and axillary nodes normal Neurologic: Grossly normal, cranial nerves II through XII intact, alert and oriented x 3 Psych: Appropriate affect   LAB RESULTS: Lab Results  Component Value Date   WBC 5.4 07/30/2013   NEUTROABS 3.0 07/30/2013   HGB 12.2 07/30/2013   HCT 36.5 07/30/2013   MCV 81.5 07/30/2013   PLT 226 07/30/2013      Chemistry      Component Value Date/Time   NA 136 07/30/2013 1110   NA 138 09/18/2012 1531   K 4.1 07/30/2013 1110   K 3.7 09/18/2012 1531   CL 106 03/21/2013 1422   CL 103 09/18/2012 1531   CO2 22 07/30/2013 1110   CO2 23 09/18/2012 1531   BUN 9.1 07/30/2013 1110   BUN 9 09/18/2012 1531   CREATININE 0.7 07/30/2013 1110   CREATININE 0.70 09/18/2012 1531   CREATININE 0.74 03/01/2012 1715      Component Value Date/Time   CALCIUM 9.2 07/30/2013 1110    CALCIUM 9.8 09/18/2012 1531   ALKPHOS 145 07/30/2013 1110   ALKPHOS 81 09/18/2012 1531   AST 51* 07/30/2013 1110   AST 18 09/18/2012 1531   ALT 32 07/30/2013 1110   ALT 13 09/18/2012 1531  BILITOT 0.35 07/30/2013 1110   BILITOT 0.3 09/18/2012 1531      Lab Results  Component Value Date   LABCA2 27 12/27/2012     STUDIES: No results found.  ASSESSMENT: 62 y.o. [place], [place] woman originally from [place] with stage IV breast cancer:  (1) Status post right lumpectomy and sentinel lymph node sampling 05/16/2001 for a pT1c pN1, stage IIA invasive ductal carcinoma, grade 2, estrogen receptor 95% and progesterone receptor 96% positive, with an MIB-1 of 11% and no HER-2 amplification by immunocytochemistry.  (2) Treated adjuvantly with cyclophosphamide, methotrexate and fluorouracil, likely for 8 cycles (data unavailable), completed February 2003.  (3) Completed radiation to the right breast and regional lymph nodes 02/12/2002.  (4) The patient apparently refused axillary lymph node dissection and antiestrogen therapy (data unavailable).  (5) Recurrent disease documented by right axillary lymph node biopsy 08/23/2012, showing a high-grade invasive ductal carcinoma which was estrogen and progesterone receptor negative, with an MIB-1 of 59%, and no HER-2 amplification; staging studies showed hyper metabolic mediastinal and left lung lesions, with no involvement of the liver or brain  (6) Abraxane every 2 weeks started 09/20/2012, with good tolerance and evidence of response, completing 12 doses 02/21/2013.  (7)  Restaging studies at [facility] March 2014 and here April-May 2014 as follows:   (a) bone involvement: [facility] bone scan showed focal tracer activity in the left acetabulum, left 12th rib posteriorly, and right sixth rib posteriorly; on CT scan a 9 mm sclerotic sacral lesion was new as compared to November of 2013.  (b) [facility] CT of the chest, abdomen and pelvis showed slight  decrease in size of some mediastinal lymph nodes, persistent unchanged nodularity in the right axilla, normal liver, and no new or enlarging pulmonary nodules.  (c) MRI of the cervical spine shows no obvious lesion to explain the patient's Right arm symptoms.  (d) PET scan shows multiple bone lesions (including C7), mild growth in index lymph nodes, no change in possible left lung nodules, and no liver involvement.  (7) Genetic testing obtained at [facility] 03/19/2013, results pending.  (8) Zolendronate started 02/21/2013, continuing monthly.   (9) Nonspecific myositis RUE documented at [facility] by EM[facility] March 2014; cervical MRI does not explain symptoms, which are likely due to a right axillary radiculopathy.  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  (11) Elevated liver enzymes, right lower quadrant abdominal discomfort.   PLAN:  Debbie Larsen will proceed with monthly Zoledronic Acid therapy today.  An MRI of the liver with contrast has been ordered since 07/31/2013 secondary to right lower quadrant pain and elevated liver enzymes. Debbie Larsen was asked to obtain the MRI before her next office visit on 08/13/2013 with Dr. Darnelle Larsen.  We plan to see Debbie Larsen again in 1 week at which time she will receive physical assessment,  laboratories and liver MRI results.   MRI results may determine future therapeutic agents, but for now therapy with every 2 week Abraxane will continue.  All questions answered.  Debbie Larsen encouraged to contact us in the interim with any questions, concerns, or problems.  Debbie Bras, Debbie Larsen-C 08/06/2013   5:11 PM  ADDENDUM: I met with Debbie Larsen after the APP met with her. I explained to Debbie Larsen that [facility] is not willing to meet with her anymore because they felt she had not disclosed the fact that she was under  treatment. Debbie Larsen felt that she had never been asked,  so she did not volunteer. At any rate that Doris" present. The other fact that emerged is at according to current [facility] rules, [facility] will not pay for participation in phase 1 studies. That really means there is very few study options for C.  [facility] people also met with her and they do not have a study at present that she might qualify for.  Accordingly the plan is to continue Abraxane. She feels that it is beginning to work. She is worried about her liver and wanted a liver MRI, or maybe a PET scan, but I think he would be better to start with a simple ultrasound and take it from there.  The plan at this point continues to be for treatment with Abraxane to maximal response, and then observation.   I personally saw this patient and performed a substantive portion of this encounter with the listed APP documented above.   Lowella Dell, Debbie Larsen

## 2013-08-06 NOTE — Patient Instructions (Addendum)
Zoledronic Acid injection (Hypercalcemia, Oncology) What is this medicine? ZOLEDRONIC ACID (ZOE le dron ik AS id) lowers the amount of calcium loss from bone. It is used to treat too much calcium in your blood from cancer. It is also used to prevent complications of cancer that has spread to the bone. This medicine may be used for other purposes; ask your health care provider or pharmacist if you have questions. What should I tell my health care provider before I take this medicine? They need to know if you have any of these conditions: -aspirin-sensitive asthma -dental disease -kidney disease -an unusual or allergic reaction to zoledronic acid, other medicines, foods, dyes, or preservatives -pregnant or trying to get pregnant -breast-feeding How should I use this medicine? This medicine is for infusion into a vein. It is given by a health care professional in a hospital or clinic setting. Talk to your pediatrician regarding the use of this medicine in children. Special care may be needed. Overdosage: If you think you have taken too much of this medicine contact a poison control center or emergency room at once. NOTE: This medicine is only for you. Do not share this medicine with others. What if I miss a dose? It is important not to miss your dose. Call your doctor or health care professional if you are unable to keep an appointment. What may interact with this medicine? -certain antibiotics given by injection -NSAIDs, medicines for pain and inflammation, like ibuprofen or naproxen -some diuretics like bumetanide, furosemide -teriparatide -thalidomide This list may not describe all possible interactions. Give your health care provider a list of all the medicines, herbs, non-prescription drugs, or dietary supplements you use. Also tell them if you smoke, drink alcohol, or use illegal drugs. Some items may interact with your medicine. What should I watch for while using this medicine? Visit  your doctor or health care professional for regular checkups. It may be some time before you see the benefit from this medicine. Do not stop taking your medicine unless your doctor tells you to. Your doctor may order blood tests or other tests to see how you are doing. Women should inform their doctor if they wish to become pregnant or think they might be pregnant. There is a potential for serious side effects to an unborn child. Talk to your health care professional or pharmacist for more information. You should make sure that you get enough calcium and vitamin D while you are taking this medicine. Discuss the foods you eat and the vitamins you take with your health care professional. Some people who take this medicine have severe bone, joint, and/or muscle pain. This medicine may also increase your risk for a broken thigh bone. Tell your doctor right away if you have pain in your upper leg or groin. Tell your doctor if you have any pain that does not go away or that gets worse. What side effects may I notice from receiving this medicine? Side effects that you should report to your doctor or health care professional as soon as possible: -allergic reactions like skin rash, itching or hives, swelling of the face, lips, or tongue -anxiety, confusion, or depression -breathing problems -changes in vision -feeling faint or lightheaded, falls -jaw burning, cramping, pain -muscle cramps, stiffness, or weakness -trouble passing urine or change in the amount of urine Side effects that usually do not require medical attention (report to your doctor or health care professional if they continue or are bothersome): -bone, joint, or muscle pain -  fever -hair loss -irritation at site where injected -loss of appetite -nausea, vomiting -stomach upset -tired This list may not describe all possible side effects. Call your doctor for medical advice about side effects. You may report side effects to FDA at  1-800-FDA-1088. Where should I keep my medicine? This drug is given in a hospital or clinic and will not be stored at home. NOTE: This sheet is a summary. It may not cover all possible information. If you have questions about this medicine, talk to your doctor, pharmacist, or health care provider.  2012, Elsevier/Gold Standard. (04/15/2011 9:06:58 AM) 

## 2013-08-08 ENCOUNTER — Ambulatory Visit: Payer: Medicaid Other

## 2013-08-08 ENCOUNTER — Other Ambulatory Visit: Payer: Medicaid Other | Admitting: Lab

## 2013-08-09 ENCOUNTER — Telehealth: Payer: Self-pay | Admitting: Family Medicine

## 2013-08-09 NOTE — Telephone Encounter (Signed)
I received a face-to-face form for Debbie Larsen's home health. Due to legal reasons for insurance, I am unable to complete this form since I have never met her and she has not had a recent visit to [facility].  Please ask Debbie Larsen to make an appointment with either me or an attending doctor soon so we can get this paperwork completed and faxed back to home health agency.  Thank you! Amber M. Hairford, M.D.

## 2013-08-09 NOTE — Telephone Encounter (Signed)
appt made for Monday afternoon to see pcp. Beata Beason,CMA

## 2013-08-12 ENCOUNTER — Other Ambulatory Visit: Payer: Self-pay | Admitting: *Deleted

## 2013-08-12 ENCOUNTER — Telehealth: Payer: Self-pay | Admitting: Oncology

## 2013-08-12 ENCOUNTER — Ambulatory Visit: Payer: Medicaid Other | Admitting: Family Medicine

## 2013-08-12 DIAGNOSIS — C50919 Malignant neoplasm of unspecified site of unspecified female breast: Secondary | ICD-10-CM

## 2013-08-12 NOTE — Telephone Encounter (Signed)
[facility] will call pt w/US appt - pt aware. Per 10/7 pof and desk nurse moved 10/14 f/u from 10am to 4pm - no need to see GM before tx. Pt aware f/u moved and she will come in 10/14 @ 2:30pm for lb/chemo and then see GM.

## 2013-08-13 ENCOUNTER — Other Ambulatory Visit: Payer: Self-pay | Admitting: Oncology

## 2013-08-13 ENCOUNTER — Other Ambulatory Visit: Payer: Self-pay | Admitting: *Deleted

## 2013-08-13 ENCOUNTER — Ambulatory Visit: Payer: Medicaid Other

## 2013-08-13 ENCOUNTER — Ambulatory Visit (HOSPITAL_BASED_OUTPATIENT_CLINIC_OR_DEPARTMENT_OTHER): Payer: Medicaid Other | Admitting: Oncology

## 2013-08-13 ENCOUNTER — Other Ambulatory Visit (HOSPITAL_BASED_OUTPATIENT_CLINIC_OR_DEPARTMENT_OTHER): Payer: Medicaid Other | Admitting: Lab

## 2013-08-13 DIAGNOSIS — C7800 Secondary malignant neoplasm of unspecified lung: Secondary | ICD-10-CM

## 2013-08-13 DIAGNOSIS — R5081 Fever presenting with conditions classified elsewhere: Secondary | ICD-10-CM | POA: Insufficient documentation

## 2013-08-13 DIAGNOSIS — C7951 Secondary malignant neoplasm of bone: Secondary | ICD-10-CM

## 2013-08-13 DIAGNOSIS — C50219 Malignant neoplasm of upper-inner quadrant of unspecified female breast: Secondary | ICD-10-CM

## 2013-08-13 DIAGNOSIS — R1031 Right lower quadrant pain: Secondary | ICD-10-CM

## 2013-08-13 DIAGNOSIS — R509 Fever, unspecified: Secondary | ICD-10-CM | POA: Insufficient documentation

## 2013-08-13 DIAGNOSIS — C773 Secondary and unspecified malignant neoplasm of axilla and upper limb lymph nodes: Secondary | ICD-10-CM

## 2013-08-13 DIAGNOSIS — C50919 Malignant neoplasm of unspecified site of unspecified female breast: Secondary | ICD-10-CM

## 2013-08-13 LAB — URINALYSIS, MICROSCOPIC - CHCC
Bilirubin (Urine): NEGATIVE
Blood: NEGATIVE
Glucose: NEGATIVE mg/dL
Ketones: NEGATIVE mg/dL
Nitrite: NEGATIVE
RBC / HPF: NEGATIVE (ref 0–2)
Specific Gravity, Urine: 1.005 (ref 1.003–1.035)
WBC, UA: NEGATIVE (ref 0–2)
pH: 6 (ref 4.6–8.0)

## 2013-08-13 LAB — COMPREHENSIVE METABOLIC PANEL
ALT: 23 U/L (ref 0–35)
AST: 27 U/L (ref 0–37)
Albumin: 3.9 g/dL (ref 3.5–5.2)
Alkaline Phosphatase: 145 U/L — ABNORMAL HIGH (ref 39–117)
BUN: 8 mg/dL (ref 6–23)
Creatinine, Ser: 0.66 mg/dL (ref 0.50–1.10)
Potassium: 3.7 mEq/L (ref 3.5–5.3)
Sodium: 135 mEq/L (ref 135–145)
Total Bilirubin: 0.3 mg/dL (ref 0.3–1.2)
Total Protein: 7.3 g/dL (ref 6.0–8.3)

## 2013-08-13 LAB — CBC WITH DIFFERENTIAL/PLATELET
BASO%: 0.7 % (ref 0.0–2.0)
EOS%: 0.5 % (ref 0.0–7.0)
Eosinophils Absolute: 0 10*3/uL (ref 0.0–0.5)
HGB: 10.8 g/dL — ABNORMAL LOW (ref 11.6–15.9)
LYMPH%: 36.9 % (ref 14.0–49.7)
MCHC: 32.9 g/dL (ref 31.5–36.0)
MCV: 82 fL (ref 79.5–101.0)
MONO%: 11.7 % (ref 0.0–14.0)
NEUT#: 3.2 10*3/uL (ref 1.5–6.5)
RBC: 4 10*6/uL (ref 3.70–5.45)
RDW: 14.6 % — ABNORMAL HIGH (ref 11.2–14.5)

## 2013-08-13 MED ORDER — AZITHROMYCIN 250 MG PO TABS: ORAL_TABLET | ORAL | Status: DC

## 2013-08-13 NOTE — Progress Notes (Signed)
This RN spoke with pt per her call to scheduling stating need to reschedule appt to Thursday due to onset of fever.  Per phone discussion Su states she developed a fever of 100.2 this am and took tylenol. Fever has continued today and she has continued use of tylenol. She states " my throat is starting to hurt more too "  This RN informed pt to come into the office for lab and md assessment.

## 2013-08-13 NOTE — Progress Notes (Signed)
Pt in @ 1630 for lab draw through port only. UA and blood cultures done as well. Pt taken to see Dr. Darnelle Catalan.

## 2013-08-13 NOTE — Progress Notes (Signed)
[facility]  [phone] [phone]  OFFICE PROGRESS NOTE    ID: Debbie Larsen   DOB: 11/17/1950  MR#: [id number]  DGU#:[id number]    PCP: Rodman Pickle, MD SU: [name], MD CARDIO: Nicholes Mango, MD RAD ONC: [name], MD   [facility] HEME/ONC:  Kandice Hams, MD NEURO:  Maeola Harman, MD   HISTORY OF PRESENT ILLNESS: The patient's 2002-2004 records have been retrieved. The patient underwent a right breast core biopsy 03/29/2001 for an invasive ductal carcinoma associated with necrosis ([id number]).  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 The tumor was estrogen receptor 95% positive, progesterone receptor 96% positive, with an MIB-1 of 11%, and no HER-2 amplification by the HercepTest. She completed 6 cycles of CMF chemotherapy on 12/12/2001. There were minor delays, but no dose reductions. She received radiation to the right breast and regional lymph nodes to a total of 5040 cGy with a right breast boost of 12 [name], completed 02/12/2002  More recently, "Debbie Larsen" develop some right shoulder pain sometime in late 2012. She had followup mammograms of until 2011. There was no mammogram performed in 2012. She was seen by physical therapy in orthopedic surgery for evaluation of her right shoulder pain. She had a mammogram on 07/24/2012 which showed a suspicious mass behind eczema of the right breast. Physical exam the time did show multiple involved right axillary lymph nodes. 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 HER-2 nonamplified the ratio by [facility] being 1.43. Proliferative index was  50%.   MRI scan of both breasts performed on 08/31/2012 showed multiple involved lymph nodes in the right axilla, no obvious  masses in either breast. Unfortunately staging PET scan 09/12/2012 showed in addition to her right axillary recurrence, bilateral mediastinal and left lung nodules consistent with stage IV disease. Her subsequent history is as detailed below.  INTERVAL HISTORY: Debbie Larsen called today to report that she had a temperature last night of 100.5. She didn't tell us of the time, instead she took Tylenol every few hours and some hot ginger T. She was supposed to come today for chemotherapy but arrived late. Instead of treating her we did a fever workup  REVIEW OF SYSTEMS: She tells me she had a mild headache, which has resolved. She has not had dizziness or vertigo. Have not been any visual changes. There's been no nausea or vomiting. Has not been any stiff neck. Just a little sinus symptoms, and mild sore throat, which she has had for a couple of weeks, "since the weather turned". There has been a mild dry cough, but no phlegm production, no hemoptysis, no pleurisy, and no shortness of breath. She denies any diarrhea or change in bowel habits, and has had no dysuria or frequency. A detailed review of systems today was otherwise fine" I feel well".   PAST MEDICAL HISTORY: Past Medical History  Diagnosis Date  . 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 Hypothyroid   . Enlarged lymph nodes   . BREAST CANCER 04/2001    s/p radiation therapy, chemotherapy and lumpectomy  . 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  . S/P radiation therapy     Right breast/regional lymph nodes/  5040 cGy / 28 fractions/ bost of 1200 cGy/ 6 Fractions  . Status post chemotherapy     6 cycles of CMF completed on 12/12/2001  . Right shoulder pain     started late 2012  . Dry cough     intermittent  . SOB (shortness of breath)   . Use of letrozole (Femara) 03/14/2013  . Metastasis to bone 03/13/13    PET scan results  . Metastasis to bone     breast primary     PAST SURGICAL HISTORY: Past Surgical History  Procedure Laterality Date  . Tonsillectomy    . Breast lumpectomy  04/2001    excision of L breast fibroadenoma by Dr.   . Breast lumpectomy  05/16/2001    Right axillary sentinel lymph node biopsy.  . Portacath placement  09/19/2012    Procedure: INSERTION PORT-A-CATH;  Surgeon: [name], MD;  Location: [facility] OR;  Service: General;  Laterality: N/A;  . Lipoma excision Right 05/15/2013    Procedure: RIGHT AXILLARY LYMPH NODE DISSECTION;  Surgeon: [name], MD;  Location: [facility] ORS;  Service: General;  Laterality: Right;    FAMILY HISTORY Family History  Problem Relation Age of Onset  . Diabetes Mother   . Hypertension Father   . Hypertension Sister   . Hypothyroidism Brother   . Cancer Neg Hx   The patient's father died from heart disease at the age of 3. The patient's mother died in 07/24/2012 at the age of 62 also from heart disease. The patient has 2 sisters and 2 brothers. There is no history of breast or ovarian cancer in the family to her knowledge.   GYNECOLOGIC HISTORY: G1 P1, first live birth age 9. She underwent menopause at the time of her chemotherapy in 2002. She did not use hormone replacement.   SOCIAL HISTORY: The patient is divorced and lives by herself. Her son Cheyeanne Roadcap lives in [place], where he works as an Publishing copy. The patient tells me her son lives with 3 roommates" there is no room for me there". (Accordingly going to [employer] for studies is not an option for her). The patient has no grandchildren. She attends the local [employer].    ADVANCED DIRECTIVES: The patient tells me (11/01/2012) her healthcare power of attorney is her sister [name]. She was unable to give me her contact information.   HEALTH MAINTENANCE: History  Substance Use Topics  . Smoking status: Never Smoker   . Smokeless tobacco: Never Used  . Alcohol Use: No     Colonoscopy: None file  PAP:  08/17/2012  Bone density: The patient's last bone density scan on 07/24/2012 showed a T score of -1.7 (osteopenia).  Lipid panel: Not on file   No Known Allergies  Current Outpatient Prescriptions  Medication Sig Dispense Refill  . B Complex-C (B-COMPLEX WITH VITAMIN C) tablet Take 1 tablet by mouth daily.      . calcium-vitamin D (OSCAL WITH D) 500-200 MG-UNIT per tablet Take 1 tablet by mouth daily with breakfast.      . levothyroxine (SYNTHROID, LEVOTHROID) 75 MCG tablet Take 75 mcg by mouth every evening.      . lidocaine-prilocaine (EMLA) cream Apply topically as needed. Apply to port 1-2 hours before procedure  30 g  0  . Multiple Vitamin (MULTIVITAMIN WITH MINERALS) TABS Take 1 tablet by mouth daily.      . ondansetron (ZOFRAN) 4 MG tablet Take one tablet twice daily (8 AM and  6 PM) beginning the evening of chemotherapy and continuing for 2 days  30 tablet  4  . prochlorperazine (COMPAZINE) 5 MG tablet Take 1 tablet (5 mg total) by mouth every 6 (six) hours as needed for nausea (take before meals and at bedtime starting the evening of chemotherapy and contune for 2 days).  30 tablet  6   No current facility-administered medications for this visit.   Facility-Administered Medications Ordered in Other Visits  Medication Dose Route Frequency Provider Last Rate Last Dose  . heparin lock flush 100 unit/mL  500 Units Intravenous Once Lowella Dell, MD      . sodium chloride 0.9 % injection 10 mL  10 mL Intravenous PRN Lowella Dell, MD      . sodium chloride 0.9 % injection 10 mL  10 mL Intravenous PRN Lowella Dell, MD   10 mL at 07/09/13 1717   Objective: Middle-aged [place] Asian woman who appears well There were no vitals filed for this visit. There were no vitals filed for this visit.   Temperature currently is 98.3, pulse 91, respiratory rate 18, and blood pressure 116/54  Sclerae unicteric Oropharynx shows no thrush or other lesions No cervical or supraclavicular  adenopathy Lungs no rales or rhonchi, with good excursion bilaterally Heart regular rate and rhythm, no murmur appreciated Abd soft, nontender, positive bowel sounds MSK no focal spinal tenderness, Neuro: nonfocal, well oriented, appropriate affect Breasts: Deferred Skin: The port is intact in the upper left anterior chest. There is no tenderness, swelling, or erythema.   LAB RESULTS: Lab Results  Component Value Date   WBC 6.3 08/13/2013   NEUTROABS 3.2 08/13/2013   HGB 10.8* 08/13/2013   HCT 32.8* 08/13/2013   MCV 82.0 08/13/2013   PLT 245 08/13/2013      Chemistry      Component Value Date/Time   NA 136 07/30/2013 1110   NA 138 09/18/2012 1531   K 4.1 07/30/2013 1110   K 3.7 09/18/2012 1531   CL 106 03/21/2013 1422   CL 103 09/18/2012 1531   CO2 22 07/30/2013 1110   CO2 23 09/18/2012 1531   BUN 9.1 07/30/2013 1110   BUN 9 09/18/2012 1531   CREATININE 0.7 07/30/2013 1110   CREATININE 0.70 09/18/2012 1531   CREATININE 0.74 03/01/2012 1715      Component Value Date/Time   CALCIUM 9.2 07/30/2013 1110   CALCIUM 9.8 09/18/2012 1531   ALKPHOS 145 07/30/2013 1110   ALKPHOS 81 09/18/2012 1531   AST 51* 07/30/2013 1110   AST 18 09/18/2012 1531   ALT 32 07/30/2013 1110   ALT 13 09/18/2012 1531   BILITOT 0.35 07/30/2013 1110   BILITOT 0.3 09/18/2012 1531      Lab Results  Component Value Date   LABCA2 27 12/27/2012     STUDIES: No results found.  ASSESSMENT: 62 y.o. [place], [place] woman originally from [place] with stage IV breast cancer:  (1) Status post right lumpectomy and sentinel lymph node sampling 05/16/2001 for a pT1c pN1, stage IIA invasive ductal carcinoma, grade 2, estrogen receptor 95% and progesterone receptor 96% positive, with an MIB-1 of 11% and no HER-2 amplification by immunocytochemistry.  (2) Treated adjuvantly with cyclophosphamide, methotrexate and fluorouracil, likely for 8 cycles (data unavailable), completed February 2003.  (3) Completed  radiation to the right breast and regional lymph nodes 02/12/2002.  (4) The patient apparently refused axillary lymph node dissection and antiestrogen therapy (data unavailable).  (5) Recurrent disease documented  by right axillary lymph node biopsy 08/23/2012, showing a high-grade invasive ductal carcinoma which was estrogen and progesterone receptor negative, with an MIB-1 of 59%, and no HER-2 amplification; staging studies showed hyper metabolic mediastinal and left lung lesions, with no involvement of the liver or brain  (6) Abraxane every 2 weeks started 09/20/2012, with good tolerance and evidence of response, completing 12 doses 02/21/2013.  (7)  Restaging studies at [facility] March 2014 and here April-May 2014 as follows:   (a) bone involvement: [facility] bone scan showed focal tracer activity in the left acetabulum, left 12th rib posteriorly, and right sixth rib posteriorly; on CT scan a 9 mm sclerotic sacral lesion was new as compared to November of 2013.  (b) [facility] CT of the chest, abdomen and pelvis showed slight decrease in size of some mediastinal lymph nodes, persistent unchanged nodularity in the right axilla, normal liver, and no new or enlarging pulmonary nodules.  (c) MRI of the cervical spine shows no obvious lesion to explain the patient's Right arm symptoms.  (d) PET scan shows multiple bone lesions (including C7), mild growth in index lymph nodes, no change in possible left lung nodules, and no liver involvement.  (7) Genetic testing obtained at [facility] 03/19/2013, results pending.  (8) Zolendronate started 02/21/2013, continuing monthly.   (9) Nonspecific myositis RUE documented at [facility] by EMG March 2014; cervical MRI does not explain symptoms, which are likely due to a right axillary radiculopathy.  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  (11)  Elevated liver enzymes, right lower quadrant abdominal discomfort.   PLAN:  I am not sure what the source of Sue's fever yesterday was. She is currently afebrile. We have drawn blood cultures and a urine culture. I do not see much use for a chest x-ray at present, although that' can be added if symptoms were to develop.  She is not neutropenic and is very stable clinically, so I'm going to start her on Zithromax. I am not going to treat today, instead removed her treatment to a week from today. She was supposed to have an abdominal ultrasound that same day, but we are moving that up one day, to make sure she can everything done as scheduled.  Debbie Larsen has a regular understanding of this plan. She knows to call us back if fever develops again or any new symptoms emerge.  Lowella Dell, MD

## 2013-08-14 LAB — URINE CULTURE

## 2013-08-15 ENCOUNTER — Telehealth: Payer: Self-pay | Admitting: Oncology

## 2013-08-15 NOTE — Telephone Encounter (Signed)
lmonvm advising the pt of her US abdomen appt that has been moved form 08/20/2013 to 08/19/2013 per md ords along with her lab,md and chemo appt on 08/20/2013@10 :45am. Sent michelle a staff message to add the chemo appt.

## 2013-08-16 ENCOUNTER — Telehealth: Payer: Self-pay | Admitting: Oncology

## 2013-08-16 ENCOUNTER — Telehealth: Payer: Self-pay | Admitting: *Deleted

## 2013-08-16 NOTE — Telephone Encounter (Signed)
Per staff message and POF I have scheduled appts.  JMW  

## 2013-08-16 NOTE — Telephone Encounter (Signed)
Pt called today to move 10/21 lb/fu/tx to PM. Per chemo scheduler ok to move tx to later on 10/21. gv pt appts for lb/fu/tx @ 2pm on 08/20/13.

## 2013-08-19 ENCOUNTER — Ambulatory Visit (HOSPITAL_COMMUNITY): Payer: Medicaid Other

## 2013-08-20 ENCOUNTER — Other Ambulatory Visit (HOSPITAL_BASED_OUTPATIENT_CLINIC_OR_DEPARTMENT_OTHER): Payer: Medicaid Other | Admitting: Lab

## 2013-08-20 ENCOUNTER — Encounter: Payer: Self-pay | Admitting: Family

## 2013-08-20 ENCOUNTER — Ambulatory Visit (HOSPITAL_BASED_OUTPATIENT_CLINIC_OR_DEPARTMENT_OTHER): Payer: Medicaid Other | Admitting: Family

## 2013-08-20 ENCOUNTER — Ambulatory Visit: Payer: Medicaid Other | Admitting: Family

## 2013-08-20 ENCOUNTER — Ambulatory Visit (HOSPITAL_COMMUNITY): Payer: Medicaid Other

## 2013-08-20 ENCOUNTER — Ambulatory Visit: Payer: Medicaid Other

## 2013-08-20 ENCOUNTER — Other Ambulatory Visit: Payer: Self-pay | Admitting: Oncology

## 2013-08-20 ENCOUNTER — Ambulatory Visit (HOSPITAL_BASED_OUTPATIENT_CLINIC_OR_DEPARTMENT_OTHER): Payer: Medicaid Other

## 2013-08-20 ENCOUNTER — Other Ambulatory Visit: Payer: Medicaid Other | Admitting: Lab

## 2013-08-20 VITALS — BP 133/64 | HR 86 | Temp 99.1°F | Resp 18

## 2013-08-20 DIAGNOSIS — C773 Secondary and unspecified malignant neoplasm of axilla and upper limb lymph nodes: Secondary | ICD-10-CM

## 2013-08-20 DIAGNOSIS — C50919 Malignant neoplasm of unspecified site of unspecified female breast: Secondary | ICD-10-CM

## 2013-08-20 DIAGNOSIS — Z5111 Encounter for antineoplastic chemotherapy: Secondary | ICD-10-CM

## 2013-08-20 DIAGNOSIS — C7951 Secondary malignant neoplasm of bone: Secondary | ICD-10-CM

## 2013-08-20 DIAGNOSIS — R1031 Right lower quadrant pain: Secondary | ICD-10-CM

## 2013-08-20 DIAGNOSIS — IMO0001 Reserved for inherently not codable concepts without codable children: Secondary | ICD-10-CM

## 2013-08-20 DIAGNOSIS — C50219 Malignant neoplasm of upper-inner quadrant of unspecified female breast: Secondary | ICD-10-CM

## 2013-08-20 DIAGNOSIS — R748 Abnormal levels of other serum enzymes: Secondary | ICD-10-CM

## 2013-08-20 LAB — COMPREHENSIVE METABOLIC PANEL (CC13)
ALT: 32 U/L (ref 0–55)
AST: 36 U/L — ABNORMAL HIGH (ref 5–34)
Albumin: 3.8 g/dL (ref 3.5–5.0)
BUN: 10.1 mg/dL (ref 7.0–26.0)
CO2: 23 mEq/L (ref 22–29)
Calcium: 9.4 mg/dL (ref 8.4–10.4)
Chloride: 104 mEq/L (ref 98–109)
Potassium: 3.9 mEq/L (ref 3.5–5.1)
Sodium: 136 mEq/L (ref 136–145)
Total Protein: 7.8 g/dL (ref 6.4–8.3)

## 2013-08-20 LAB — CBC WITH DIFFERENTIAL/PLATELET
BASO%: 0.3 % (ref 0.0–2.0)
Basophils Absolute: 0 10*3/uL (ref 0.0–0.1)
Eosinophils Absolute: 0 10*3/uL (ref 0.0–0.5)
HCT: 34.9 % (ref 34.8–46.6)
HGB: 11.7 g/dL (ref 11.6–15.9)
MCHC: 33.5 g/dL (ref 31.5–36.0)
MONO#: 0.7 10*3/uL (ref 0.1–0.9)
NEUT%: 56.8 % (ref 38.4–76.8)
RDW: 14.1 % (ref 11.2–14.5)
WBC: 7.3 10*3/uL (ref 3.9–10.3)
lymph#: 2.4 10*3/uL (ref 0.9–3.3)
nRBC: 0 % (ref 0–0)

## 2013-08-20 MED ORDER — ONDANSETRON 8 MG/NS 50 ML IVPB
INTRAVENOUS | Status: AC
  Filled 2013-08-20: qty 8

## 2013-08-20 MED ORDER — ONDANSETRON 8 MG/50ML IVPB (CHCC)
8.0000 mg | Freq: Once | INTRAVENOUS | Status: AC
  Administered 2013-08-20: 8 mg via INTRAVENOUS

## 2013-08-20 MED ORDER — SODIUM CHLORIDE 0.9 % IJ SOLN
10.0000 mL | INTRAMUSCULAR | Status: DC | PRN
  Administered 2013-08-20: 10 mL
  Filled 2013-08-20: qty 10

## 2013-08-20 MED ORDER — HEPARIN SOD (PORK) LOCK FLUSH 100 UNIT/ML IV SOLN
500.0000 [IU] | Freq: Once | INTRAVENOUS | Status: AC | PRN
  Administered 2013-08-20: 500 [IU]
  Filled 2013-08-20: qty 5

## 2013-08-20 MED ORDER — DEXAMETHASONE SODIUM PHOSPHATE 10 MG/ML IJ SOLN
10.0000 mg | Freq: Once | INTRAMUSCULAR | Status: AC
  Administered 2013-08-20: 10 mg via INTRAVENOUS

## 2013-08-20 MED ORDER — DEXAMETHASONE SODIUM PHOSPHATE 10 MG/ML IJ SOLN
INTRAMUSCULAR | Status: AC
  Filled 2013-08-20: qty 1

## 2013-08-20 MED ORDER — SODIUM CHLORIDE 0.9 % IV SOLN
Freq: Once | INTRAVENOUS | Status: AC
  Administered 2013-08-20: 16:00:00 via INTRAVENOUS

## 2013-08-20 MED ORDER — PACLITAXEL PROTEIN-BOUND CHEMO INJECTION 100 MG
100.0000 mg/m2 | Freq: Once | INTRAVENOUS | Status: AC
  Administered 2013-08-20: 150 mg via INTRAVENOUS
  Filled 2013-08-20: qty 30

## 2013-08-20 NOTE — Progress Notes (Signed)
[facility]  [phone] [phone]  OFFICE PROGRESS NOTE    ID: Debbie Larsen   DOB: [birth year]  MR#: [id number]  OZH#:[id number]    PCP: Rodman Pickle, MD SU: [name], MD CARDIO: Nicholes Mango, MD RAD ONC: [name], MD   [facility] HEME/ONC:  Kandice Hams, MD NEURO:  Maeola Harman, MD   HISTORY OF PRESENT ILLNESS: The patient's 2002-2004 records have been retrieved. The patient underwent a right breast core biopsy 03/29/2001 for an invasive ductal carcinoma associated with necrosis ([id number]).  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 The tumor was estrogen receptor 95% positive, progesterone receptor 96% positive, with an MIB-1 of 11%, and no HER-2 amplification by the HercepTest. She completed 6 cycles of CMF chemotherapy on 12/12/2001. There were minor delays, but no dose reductions. She received radiation to the right breast and regional lymph nodes to a total of 5040 cGy with a right breast boost of 12 [name], completed 02/12/2002  More recently, "Debbie Larsen" develop some right shoulder pain sometime in late 2012. She had followup mammograms of until 2011. There was no mammogram performed in 2012. She was seen by physical therapy in orthopedic surgery for evaluation of her right shoulder pain. She had a mammogram on 07/24/2012 which showed a suspicious mass behind eczema of the right breast. Physical exam the time did show multiple involved right axillary lymph nodes. 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 HER-2 nonamplified the ratio by C[facility] being 1.43. Proliferative index was  50%.   MRI scan of both breasts performed on 08/31/2012 showed multiple involved lymph nodes in the right axilla, no obvious  masses in either breast. Unfortunately staging PET scan 09/12/2012 showed in addition to her right axillary recurrence, bilateral mediastinal and left lung nodules consistent with stage IV disease. Her subsequent history is as detailed below.   INTERVAL HISTORY: Debbie Larsen returns today for followup of her metastatic breast cancer. Her interval history is relatively unremarkable.  It will be recalled that during her last office visit on 08/13/2013, Debbie Larsen complained of having a fever the night before of 100.66F.  Dr. Darnelle Catalan subsequently started her on antibiotic therapy with a 5 day Z-Pak.  Debbie Larsen states today she took 3 days of antibiotic therapy, started feeling better, and omitted the final 2 days of antibiotic therapy.  Encouraged her to finish the final 2 days of antibiotic therapy to complete the course.  We also discussed obtaining the abdominal ultrasound for her elevated liver enzymes.    REVIEW OF SYSTEMS: A 10 point review of systems was completed and is negative except for recent seasonal allergy exacerbation that resolved when she ceased working in her yard.  Debbie Larsen denies any other symptomatology including fatigue, fever or chills, headache, vision changes, swollen glands, cough or shortness of breath, chest pain or discomfort, nausea, vomiting, diarrhea, constipation, change in urinary or bowel habits, arthralgias/myalgias, unusual bleeding/bruising or any other symptomatology.  A detailed review of systems today was otherwise unremarkable.   PAST MEDICAL HISTORY: Past Medical History  Diagnosis Date  . 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 Hypothyroid   . Enlarged lymph nodes   . BREAST CANCER 04/2001    s/p radiation therapy, chemotherapy and lumpectomy  . 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  . S/P radiation therapy     Right breast/regional lymph nodes/ 5040 cGy / 28 fractions/ bost of 1200  cGy/ 6 Fractions  . Status post chemotherapy     6 cycles of CMF completed on 12/12/2001  . Right shoulder pain     started late 2012  . Dry cough     intermittent  . SOB (shortness of breath)   . Use of letrozole (Femara) 03/14/2013  . Metastasis to bone 03/13/13    PET scan results  . Metastasis to bone     breast primary    PAST SURGICAL HISTORY: Past Surgical History  Procedure Laterality Date  . Tonsillectomy    . Breast lumpectomy  04/2001    excision of L breast fibroadenoma by Dr.   . Breast lumpectomy  05/16/2001    Right axillary sentinel lymph node biopsy.  . Portacath placement  09/19/2012    Procedure: INSERTION PORT-A-CATH;  Surgeon: [name], MD;  Location: [facility] OR;  Service: General;  Laterality: N/A;  . Lipoma excision Right 05/15/2013    Procedure: RIGHT AXILLARY LYMPH NODE DISSECTION;  Surgeon: [name], MD;  Location: [facility] ORS;  Service: General;  Laterality: Right;    FAMILY HISTORY Family History  Problem Relation Age of Onset  . Diabetes Mother   . Hypertension Father   . Hypertension Sister   . Hypothyroidism Brother   . Cancer Neg Hx   The patient's father died from heart disease at the age of 66. The patient's mother died in 07/11/12 at the age of 72 also from heart disease. The patient has 2 sisters and 2 brothers. There is no history of breast or ovarian cancer in the family to her knowledge.   GYNECOLOGIC HISTORY: G1 P1, first live birth age 51. She underwent menopause at the time of her chemotherapy in 2002. She did not use hormone replacement.   SOCIAL HISTORY: The patient is divorced and lives by herself. Her son Samariyah Cowles lives in [place], where he works as an Publishing copy. The patient tells me her son lives with 3 roommates" there is no room for me there". (Accordingly going to [employer] for studies is not an option for her). The patient has no grandchildren. She attends the local [employer].    ADVANCED  DIRECTIVES: The patient tells me (11/01/2012) her healthcare power of attorney is her sister Garyn Waguespack. She was unable to give me her contact information.   HEALTH MAINTENANCE: History  Substance Use Topics  . Smoking status: Never Smoker   . Smokeless tobacco: Never Used  . Alcohol Use: No     Colonoscopy: None file  PAP: 08/17/2012  Bone density: The patient's last bone density scan on 07/24/2012 showed a T score of -1.7 (osteopenia).  Lipid panel: Not on file   No Known Allergies  Current Outpatient Prescriptions  Medication Sig Dispense Refill  . azithromycin (ZITHROMAX Z-PAK) 250 MG tablet Take as directed  6 each  0  . B Complex-C (B-COMPLEX WITH VITAMIN C) tablet Take 1 tablet by mouth daily.      . calcium-vitamin D (OSCAL WITH D) 500-200 MG-UNIT per tablet Take 1 tablet by mouth daily with breakfast.      . levothyroxine (SYNTHROID, LEVOTHROID) 75 MCG tablet Take 75 mcg by mouth every evening.      . lidocaine-prilocaine (EMLA) cream Apply  topically as needed. Apply to port 1-2 hours before procedure  30 g  0  . Multiple Vitamin (MULTIVITAMIN WITH MINERALS) TABS Take 1 tablet by mouth daily.      . ondansetron (ZOFRAN) 4 MG tablet Take one tablet twice daily (8 AM and 6 PM) beginning the evening of chemotherapy and continuing for 2 days  30 tablet  4  . prochlorperazine (COMPAZINE) 5 MG tablet Take 1 tablet (5 mg total) by mouth every 6 (six) hours as needed for nausea (take before meals and at bedtime starting the evening of chemotherapy and contune for 2 days).  30 tablet  6   No current facility-administered medications for this visit.   Facility-Administered Medications Ordered in Other Visits  Medication Dose Route Frequency Provider Last Rate Last Dose  . heparin lock flush 100 unit/mL  500 Units Intravenous Once Lowella Dell, MD      . sodium chloride 0.9 % injection 10 mL  10 mL Intravenous PRN Lowella Dell, MD      . sodium chloride 0.9 % injection 10  mL  10 mL Intravenous PRN Lowella Dell, MD   10 mL at 07/09/13 1717   Objective: Middle-aged [place] Asian woman who appears well: Blood pressure 133/64, heart rate 86, respiratory rate 18, temperature 99.72F, height 5 foot, weight 119 pounds,O2 100% on room air ECOG FS: 1 - Symptomatic but completely ambulatory   General appearance: Alert, irritable affect well nourished, no apparent distress  Head: Normocephalic, without obvious abnormality, atraumatic  Eyes: Conjunctivae/corneas clear, PERRLA, EOMI  Nose: Nares, septum and mucosa are normal, no drainage or sinus tenderness.  Neck: No adenopathy, supple, symmetrical, trachea midline, thyroid not enlarged, no tenderness  Resp: Clear to auscultation bilaterally  Cardio: Regular rate and rhythm, S1, S2 normal, no murmur, click, rub or gallop, left chest Port-A-Cath without signs of infection  Breasts: Deferred  GI: Soft, distended, non-tender to palpation, hypoactive bowel sounds  Skin: No rashes/lesions, skin warm and dry, no erythematous areas, no cyanosis  M/S: Atraumatic, normal strength in all extremities, normal range of motion, no clubbing  Lymph nodes: Cervical, supraclavicular, and axillary nodes normal  Neurologic: Grossly normal, cranial nerves II through XII intact, alert and oriented x 3  Psych: Irritable affect     LAB RESULTS: Lab Results  Component Value Date   WBC 7.3 08/20/2013   NEUTROABS 4.2 08/20/2013   HGB 11.7 08/20/2013   HCT 34.9 08/20/2013   MCV 81.0 08/20/2013   PLT 249 08/20/2013      Chemistry      Component Value Date/Time   NA 136 08/20/2013 1526   NA 135 08/13/2013 1532   K 3.9 08/20/2013 1526   K 3.7 08/13/2013 1532   CL 99 08/13/2013 1532   CL 106 03/21/2013 1422   CO2 23 08/20/2013 1526   CO2 22 08/13/2013 1532   BUN 10.1 08/20/2013 1526   BUN 8 08/13/2013 1532   CREATININE 0.8 08/20/2013 1526   CREATININE 0.66 08/13/2013 1532   CREATININE 0.74 03/01/2012 1715      Component Value  Date/Time   CALCIUM 9.4 08/20/2013 1526   CALCIUM 9.4 08/13/2013 1532   ALKPHOS 127 08/20/2013 1526   ALKPHOS 145* 08/13/2013 1532   AST 36* 08/20/2013 1526   AST 27 08/13/2013 1532   ALT 32 08/20/2013 1526   ALT 23 08/13/2013 1532   BILITOT 0.34 08/20/2013 1526   BILITOT 0.3 08/13/2013 1532      Lab Results  Component Value Date   LABCA2 27 12/27/2012     STUDIES: No results found.  ASSESSMENT: GUSTAVO MEDITZ is a  62 y.o. [place], [place] woman originally from [place] with stage IV breast cancer:  (1) Status post right lumpectomy and sentinel lymph node sampling 05/16/2001 for a pT1c pN1, stage IIA invasive ductal carcinoma, grade 2, estrogen receptor 95% and progesterone receptor 96% positive, with an MIB-1 of 11% and no HER-2 amplification by immunocytochemistry.  (2) Treated adjuvantly with cyclophosphamide, methotrexate and fluorouracil, likely for 8 cycles (data unavailable), completed February 2003.  (3) Completed radiation to the right breast and regional lymph nodes 02/12/2002.  (4) The patient apparently refused axillary lymph node dissection and antiestrogen therapy (data unavailable).  (5) Recurrent disease documented by right axillary lymph node biopsy 08/23/2012, showing a high-grade invasive ductal carcinoma which was estrogen and progesterone receptor negative, with an MIB-1 of 59%, and no HER-2 amplification; staging studies showed hyper metabolic mediastinal and left lung lesions, with no involvement of the liver or brain  (6) Abraxane every 2 weeks started 09/20/2012, with good tolerance and evidence of response, completing 12 doses 02/21/2013.  (7)  Restaging studies at [facility] March 2014 and here April-May 2014 as follows:   (a) bone involvement: [facility] bone scan showed focal tracer activity in the left acetabulum, left 12th rib posteriorly, and right sixth rib posteriorly; on CT scan a 9 mm sclerotic sacral lesion was new as compared to November of  2013.  (b) [facility] CT of the chest, abdomen and pelvis showed slight decrease in size of some mediastinal lymph nodes, persistent unchanged nodularity in the right axilla, normal liver, and no new or enlarging pulmonary nodules.  (c) MRI of the cervical spine shows no obvious lesion to explain the patient's Right arm symptoms.  (d) PET scan shows multiple bone lesions (including C7), mild growth in index lymph nodes, no change in possible left lung nodules, and no liver involvement.  (7) Genetic testing obtained at [facility] 03/19/2013, results pending.  (8) Zolendronate started 02/21/2013, continuing monthly.   (9) Nonspecific myositis RUE documented at [facility] by EMG March 2014; cervical MRI does not explain symptoms, which are likely due to a right axillary radiculopathy.  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  (11) Elevated liver enzymes, right lower quadrant abdominal discomfort -abdominal ultrasound ordered since 08/12/2013  (12) Recent fever on 08/13/2013 and was given antibiotic therapy.    PLAN:  Debbie Larsen will proceed today with every two-week Abraxane therapy.  Monthly Zoledronic Acid therapy is due again around 09/06/2013. An ultrasound of the liver with contrast has been ordered since 08/12/2013.  Abdominal ultrasound was ordered secondary to right lower quadrant pain and elevated liver enzymes. Debbie Larsen was asked to obtain the [place] before her next office visit.  We plan to see Debbie Larsen again on 09/03/2013 at which time she will receive physical assessment and laboratory value evaluation prior to proceeding with Abraxane therapy.   All questions answered. Debbie Larsen encouraged to contact us in the interim with any questions, concerns, or problems.  Larina Bras, NP-C 08/21/2013 3:58 PM

## 2013-08-20 NOTE — Patient Instructions (Signed)
Please contact us at [phone] if you have any questions or concerns.  Get plenty of rest, drink plenty of water, exercise daily (walking), eat a balanced diet.  Results for orders placed in visit on 08/20/13 (from the past 24 hour(s))  CBC WITH DIFFERENTIAL     Status: None   Collection Time    08/20/13  3:18 PM      Result Value Range   WBC 7.3  3.9 - 10.3 10e3/uL   NEUT# 4.2  1.5 - 6.5 10e3/uL   HGB 11.7  11.6 - 15.9 g/dL   HCT 16.1  09.6 - 04.5 %   Platelets 249  145 - 400 10e3/uL   MCV 81.0  79.5 - 101.0 fL   MCH 27.1  25.1 - 34.0 pg   MCHC 33.5  31.5 - 36.0 g/dL   RBC 4.09  8.11 - 9.14 10e6/uL   RDW 14.1  11.2 - 14.5 %   lymph# 2.4  0.9 - 3.3 10e3/uL   MONO# 0.7  0.1 - 0.9 10e3/uL   Eosinophils Absolute 0.0  0.0 - 0.5 10e3/uL   Basophils Absolute 0.0  0.0 - 0.1 10e3/uL   NEUT% 56.8  38.4 - 76.8 %   LYMPH% 33.1  14.0 - 49.7 %   MONO% 9.3  0.0 - 14.0 %   EOS% 0.5  0.0 - 7.0 %   BASO% 0.3  0.0 - 2.0 %   nRBC 0  0 - 0 %   Narrative:    Performed At:  [facility]               [address]               [place], [place] [address]

## 2013-08-20 NOTE — Patient Instructions (Signed)
[facility] Discharge Instructions for Patients Receiving Chemotherapy  Today you received the following chemotherapy agent: Abraxane   To help prevent nausea and vomiting after your treatment, we encourage you to take your nausea medication as prescribed.    If you develop nausea and vomiting that is not controlled by your nausea medication, call the clinic.   BELOW ARE SYMPTOMS THAT SHOULD BE REPORTED IMMEDIATELY:  *FEVER GREATER THAN 100.5 F  *CHILLS WITH OR WITHOUT FEVER  NAUSEA AND VOMITING THAT IS NOT CONTROLLED WITH YOUR NAUSEA MEDICATION  *UNUSUAL SHORTNESS OF BREATH  *UNUSUAL BRUISING OR BLEEDING  TENDERNESS IN MOUTH AND THROAT WITH OR WITHOUT PRESENCE OF ULCERS  *URINARY PROBLEMS  *BOWEL PROBLEMS  UNUSUAL RASH Items with * indicate a potential emergency and should be followed up as soon as possible.  Feel free to call the clinic you have any questions or concerns. The clinic phone number is [phone].    

## 2013-08-22 ENCOUNTER — Telehealth: Payer: Self-pay | Admitting: *Deleted

## 2013-08-22 NOTE — Telephone Encounter (Signed)
Per staff message and POF I have scheduled appts.  JMW  

## 2013-08-23 ENCOUNTER — Telehealth: Payer: Self-pay | Admitting: Oncology

## 2013-08-28 ENCOUNTER — Other Ambulatory Visit: Payer: Self-pay | Admitting: *Deleted

## 2013-08-28 ENCOUNTER — Ambulatory Visit (HOSPITAL_COMMUNITY)
Admission: RE | Admit: 2013-08-28 | Discharge: 2013-08-28 | Disposition: A | Discharge status: Home / Self Care | Payer: Medicaid Other | Source: Ambulatory Visit | Attending: Oncology | Admitting: Oncology

## 2013-08-28 ENCOUNTER — Other Ambulatory Visit: Payer: Self-pay | Admitting: Physician Assistant

## 2013-08-28 ENCOUNTER — Telehealth: Payer: Self-pay | Admitting: *Deleted

## 2013-08-28 ENCOUNTER — Ambulatory Visit (HOSPITAL_COMMUNITY)
Admission: RE | Admit: 2013-08-28 | Discharge: 2013-08-28 | Disposition: A | Discharge status: Home / Self Care | Payer: Medicaid Other | Source: Ambulatory Visit | Attending: Physician Assistant | Admitting: Physician Assistant

## 2013-08-28 DIAGNOSIS — C7951 Secondary malignant neoplasm of bone: Secondary | ICD-10-CM

## 2013-08-28 DIAGNOSIS — R1031 Right lower quadrant pain: Secondary | ICD-10-CM | POA: Insufficient documentation

## 2013-08-28 DIAGNOSIS — K8020 Calculus of gallbladder without cholecystitis without obstruction: Secondary | ICD-10-CM | POA: Insufficient documentation

## 2013-08-28 DIAGNOSIS — C50919 Malignant neoplasm of unspecified site of unspecified female breast: Secondary | ICD-10-CM

## 2013-08-28 DIAGNOSIS — Z853 Personal history of malignant neoplasm of breast: Secondary | ICD-10-CM | POA: Insufficient documentation

## 2013-08-28 NOTE — Telephone Encounter (Signed)
Received call from [facility], ultrasound technician re:  Pt is in ultrasound now for liver ultrasound.  Pt c/o of pelvic pain and would like to have pelvic xray.  Spoke with pt and was informed that pt has been having pelvic pain for about  4 -5 days almost a week.  Pt would like to have her pelvic evaluated by xray.  Informed pt that message will be relayed to Dr. Darnelle Catalan for further instructions.  Pt voiced understanding.

## 2013-09-02 ENCOUNTER — Other Ambulatory Visit: Payer: Self-pay | Admitting: Physician Assistant

## 2013-09-02 DIAGNOSIS — C50919 Malignant neoplasm of unspecified site of unspecified female breast: Secondary | ICD-10-CM

## 2013-09-03 ENCOUNTER — Telehealth: Payer: Self-pay | Admitting: *Deleted

## 2013-09-03 ENCOUNTER — Ambulatory Visit: Payer: Medicaid Other | Admitting: Physician Assistant

## 2013-09-03 ENCOUNTER — Ambulatory Visit (HOSPITAL_BASED_OUTPATIENT_CLINIC_OR_DEPARTMENT_OTHER): Payer: Medicaid Other

## 2013-09-03 ENCOUNTER — Other Ambulatory Visit (HOSPITAL_BASED_OUTPATIENT_CLINIC_OR_DEPARTMENT_OTHER): Payer: Medicaid Other | Admitting: Lab

## 2013-09-03 ENCOUNTER — Ambulatory Visit (HOSPITAL_BASED_OUTPATIENT_CLINIC_OR_DEPARTMENT_OTHER): Payer: Medicaid Other | Admitting: Family

## 2013-09-03 ENCOUNTER — Encounter: Payer: Self-pay | Admitting: Family

## 2013-09-03 VITALS — BP 116/74 | HR 88 | Temp 98.7°F | Resp 20 | Ht 60.0 in | Wt 117.7 lb

## 2013-09-03 DIAGNOSIS — C50219 Malignant neoplasm of upper-inner quadrant of unspecified female breast: Secondary | ICD-10-CM

## 2013-09-03 DIAGNOSIS — Z5111 Encounter for antineoplastic chemotherapy: Secondary | ICD-10-CM

## 2013-09-03 DIAGNOSIS — C7951 Secondary malignant neoplasm of bone: Secondary | ICD-10-CM

## 2013-09-03 DIAGNOSIS — R1031 Right lower quadrant pain: Secondary | ICD-10-CM

## 2013-09-03 DIAGNOSIS — R911 Solitary pulmonary nodule: Secondary | ICD-10-CM

## 2013-09-03 DIAGNOSIS — R748 Abnormal levels of other serum enzymes: Secondary | ICD-10-CM

## 2013-09-03 DIAGNOSIS — C50919 Malignant neoplasm of unspecified site of unspecified female breast: Secondary | ICD-10-CM

## 2013-09-03 DIAGNOSIS — Z170 Estrogen receptor positive status [ER+]: Secondary | ICD-10-CM

## 2013-09-03 LAB — COMPREHENSIVE METABOLIC PANEL (CC13)
AST: 50 U/L — ABNORMAL HIGH (ref 5–34)
Albumin: 4.1 g/dL (ref 3.5–5.0)
Alkaline Phosphatase: 154 U/L — ABNORMAL HIGH (ref 40–150)
BUN: 9.6 mg/dL (ref 7.0–26.0)
CO2: 20 mEq/L — ABNORMAL LOW (ref 22–29)
Calcium: 9.7 mg/dL (ref 8.4–10.4)
Chloride: 103 mEq/L (ref 98–109)
Potassium: 3.8 mEq/L (ref 3.5–5.1)
Sodium: 138 mEq/L (ref 136–145)
Total Protein: 7.9 g/dL (ref 6.4–8.3)

## 2013-09-03 LAB — CBC WITH DIFFERENTIAL/PLATELET
BASO%: 0.2 % (ref 0.0–2.0)
Basophils Absolute: 0 10*3/uL (ref 0.0–0.1)
EOS%: 0.6 % (ref 0.0–7.0)
Eosinophils Absolute: 0 10*3/uL (ref 0.0–0.5)
HCT: 35.3 % (ref 34.8–46.6)
HGB: 11.7 g/dL (ref 11.6–15.9)
MCHC: 33.1 g/dL (ref 31.5–36.0)
MONO#: 0.7 10*3/uL (ref 0.1–0.9)
NEUT%: 45.6 % (ref 38.4–76.8)
Platelets: 245 10*3/uL (ref 145–400)
RDW: 14.8 % — ABNORMAL HIGH (ref 11.2–14.5)
WBC: 5.3 10*3/uL (ref 3.9–10.3)
lymph#: 2.2 10*3/uL (ref 0.9–3.3)

## 2013-09-03 MED ORDER — DEXAMETHASONE SODIUM PHOSPHATE 10 MG/ML IJ SOLN
10.0000 mg | Freq: Once | INTRAMUSCULAR | Status: AC
  Administered 2013-09-03: 10 mg via INTRAVENOUS

## 2013-09-03 MED ORDER — HEPARIN SOD (PORK) LOCK FLUSH 100 UNIT/ML IV SOLN
500.0000 [IU] | Freq: Once | INTRAVENOUS | Status: AC | PRN
  Administered 2013-09-03: 500 [IU]
  Filled 2013-09-03: qty 5

## 2013-09-03 MED ORDER — SODIUM CHLORIDE 0.9 % IJ SOLN
10.0000 mL | INTRAMUSCULAR | Status: DC | PRN
  Administered 2013-09-03: 10 mL
  Filled 2013-09-03: qty 10

## 2013-09-03 MED ORDER — ONDANSETRON 8 MG/50ML IVPB (CHCC)
8.0000 mg | Freq: Once | INTRAVENOUS | Status: AC
  Administered 2013-09-03: 8 mg via INTRAVENOUS

## 2013-09-03 MED ORDER — ONDANSETRON 8 MG/NS 50 ML IVPB
INTRAVENOUS | Status: AC
  Filled 2013-09-03: qty 8

## 2013-09-03 MED ORDER — SODIUM CHLORIDE 0.9 % IV SOLN
Freq: Once | INTRAVENOUS | Status: AC
  Administered 2013-09-03: 15:00:00 via INTRAVENOUS

## 2013-09-03 MED ORDER — PACLITAXEL PROTEIN-BOUND CHEMO INJECTION 100 MG
100.0000 mg/m2 | Freq: Once | INTRAVENOUS | Status: AC
  Administered 2013-09-03: 150 mg via INTRAVENOUS
  Filled 2013-09-03: qty 30

## 2013-09-03 MED ORDER — DEXAMETHASONE SODIUM PHOSPHATE 10 MG/ML IJ SOLN
INTRAMUSCULAR | Status: AC
  Filled 2013-09-03: qty 1

## 2013-09-03 NOTE — Telephone Encounter (Signed)
Pt is aware that i gv the referral to [facility]....td

## 2013-09-03 NOTE — Progress Notes (Signed)
[facility]  Telephone:([phone] [phone]  OFFICE PROGRESS NOTE   ID: Debbie Larsen   DOB: [birth year]  MR#: [id number]  QIO#:[id number]    PCP: Debbie Pickle, MD SU: Debbie Paris, MD CARDIO: Debbie Mango, MD RAD ONC: Debbie Gottron, MD   [facility] HEME/ONC:  Debbie Hams, MD NEURO:  Debbie Harman, MD   HISTORY OF PRESENT ILLNESS: The patient's 2002-2004 records have been retrieved. The patient underwent a right breast core biopsy 03/29/2001 for an invasive ductal carcinoma associated with necrosis ([id number]).  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 The tumor was estrogen receptor 95% positive, progesterone receptor 96% positive, with an MIB-1 of 11%, and no HER-2 amplification by the HercepTest. She completed 6 cycles of CMF chemotherapy on 12/12/2001. There were minor delays, but no dose reductions. She received radiation to the right breast and regional lymph nodes to a total of 5040 cGy with a right breast boost of 12 Debbie Larsen, completed 02/12/2002  More recently, "Debbie Larsen" develop some right shoulder pain sometime in late 2012. She had followup mammograms of until 2011. There was no mammogram performed in 2012. She was seen by physical therapy in orthopedic surgery for evaluation of her right shoulder pain. She had a mammogram on 07/24/2012 which showed a suspicious mass behind eczema of the right breast. Physical exam the time did show multiple involved right axillary lymph nodes. 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 HER-2 nonamplified the ratio by CISH being 1.43. Proliferative index was  50%.   MRI scan of both breasts performed on 08/31/2012 showed multiple involved lymph nodes in the right axilla, no obvious  masses in either breast. Unfortunately staging PET scan 09/12/2012 showed in addition to her right axillary recurrence, bilateral mediastinal and left lung nodules consistent with stage IV disease. Her subsequent history is as detailed below.   INTERVAL HISTORY: Debbie Larsen returns today for followup of her metastatic breast cancer. Her interval history is relatively unremarkable.  We discussed the results of her recent abdominal ultrasound and abdominal x-ray related to elevated liver enzymes.  The abdominal ultrasound showed cholelithiasis without evidence of acute cholecystitis.  Normal appearance of bile ducts in liver.  Abdominal x-ray showed calcified phleboliths in the anatomic pelvis, nonobstructive bowel gas pattern, no free air or organomegaly, no acute bony abnormality, no acute findings.  Her interval history is otherwise stable.   REVIEW OF SYSTEMS: A 10 point review of systems was completed and is negative except for ongoing right lower quadrant/right inguinal discomfort.  Debbie Larsen has not had recent pelvic exam by Debbie Larsen, asked her to complete this.  Debbie Larsen denies any other symptomatology including fatigue, fever or chills, headache, vision changes, swollen glands, cough or shortness of breath, chest pain or discomfort, nausea, vomiting, diarrhea, constipation, change in urinary or bowel habits, any other arthralgias/myalgias, unusual bleeding/bruising or any other symptomatology.  A detailed review of systems today was otherwise unremarkable.   PAST MEDICAL HISTORY: Past Medical History  Diagnosis Date  . 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 Hypothyroid   . Enlarged lymph nodes   . BREAST CANCER 04/2001    s/p radiation therapy, chemotherapy and lumpectomy  . 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  . S/P radiation therapy     Right breast/regional lymph nodes/ 5040 cGy / 28 fractions/ bost of 1200 cGy/ 6  Fractions  . Status post chemotherapy     6 cycles of CMF completed on 12/12/2001  . Right shoulder pain     started late 2012  . Dry cough     intermittent  . SOB (shortness of breath)   . Use of letrozole (Femara) 03/14/2013  . Metastasis to bone 03/13/13    PET scan results  . Metastasis to bone     breast primary  . Cholelithiasis     PAST SURGICAL HISTORY: Past Surgical History  Procedure Laterality Date  . Tonsillectomy    . Breast lumpectomy  04/2001    excision of L breast fibroadenoma by Dr.   . Breast lumpectomy  05/16/2001    Right axillary sentinel lymph node biopsy.  . Portacath placement  09/19/2012    Procedure: INSERTION PORT-A-CATH;  Surgeon: Debbie Paris, MD;  Location: [facility] OR;  Service: General;  Laterality: N/A;  . Lipoma excision Right 05/15/2013    Procedure: RIGHT AXILLARY LYMPH NODE DISSECTION;  Surgeon: Debbie Paris, MD;  Location: [facility] ORS;  Service: General;  Laterality: Right;    FAMILY HISTORY Family History  Problem Relation Age of Onset  . Diabetes Mother   . Hypertension Father   . Hypertension Sister   . Hypothyroidism Brother   . Cancer Neg Hx   The patient's father died from heart disease at the age of 33. The patient's mother died in 07/23/2012 at the age of 10 also from heart disease. The patient has 2 sisters and 2 brothers. There is no history of breast or ovarian cancer in the family to her knowledge.   GYNECOLOGIC HISTORY: G1 P1, first live birth age 6. She underwent menopause at the time of her chemotherapy in 2002. She did not use hormone replacement.   SOCIAL HISTORY: The patient is divorced and lives by herself. Her son Debbie Larsen lives in [place], where he works as an Publishing copy. The patient tells me her son lives with 3 roommates" there is no room for me there". (Accordingly going to [employer] for studies is not an option for her). The patient has no grandchildren. She attends the local [employer].    ADVANCED DIRECTIVES: The patient tells me (11/01/2012) her healthcare power of attorney is her sister Debbie Larsen. She was unable to give me her contact information.   HEALTH MAINTENANCE: History  Substance Use Topics  . Smoking status: Never Smoker   . Smokeless tobacco: Never Used  . Alcohol Use: No     Colonoscopy: None file  PAP: 08/17/2012  Bone density: The patient's last bone density scan on 07/24/2012 showed a T score of -1.7 (osteopenia).  Lipid panel: Not on file   No Known Allergies  Current Outpatient Prescriptions  Medication Sig Dispense Refill  . B Complex-C (B-COMPLEX WITH VITAMIN C) tablet Take 1 tablet by mouth daily.      . calcium-vitamin D (OSCAL WITH D) 500-200 MG-UNIT per tablet Take 1 tablet by mouth daily with breakfast.      . levothyroxine (SYNTHROID, LEVOTHROID) 75 MCG tablet Take 75 mcg by mouth every evening.      . lidocaine-prilocaine (EMLA) cream Apply topically as needed. Apply to port 1-2 hours before procedure  30 g  0  . Multiple Vitamin (MULTIVITAMIN WITH MINERALS) TABS Take 1 tablet  by mouth daily.      . ondansetron (ZOFRAN) 4 MG tablet Take one tablet twice daily (8 AM and 6 PM) beginning the evening of chemotherapy and continuing for 2 days  30 tablet  4  . prochlorperazine (COMPAZINE) 5 MG tablet Take 1 tablet (5 mg total) by mouth every 6 (six) hours as needed for nausea (take before meals and at bedtime starting the evening of chemotherapy and contune for 2 days).  30 tablet  6   No current facility-administered medications for this visit.   Facility-Administered Medications Ordered in Other Visits  Medication Dose Route Frequency Provider Last Rate Last Dose  . heparin lock flush 100 unit/mL  500 Units Intravenous Once Lowella Dell, MD      . sodium chloride 0.9 % injection 10 mL  10 mL Intravenous PRN Lowella Dell, MD      . sodium chloride 0.9 % injection 10 mL  10 mL Intravenous PRN Lowella Dell, MD   10  mL at 07/09/13 1717  . sodium chloride 0.9 % injection 10 mL  10 mL Intracatheter PRN Lowella Dell, MD   10 mL at 09/03/13 1644   Objective: Middle-aged [place] woman who appears well: BP 116/74  Pulse 88  Temp(Src) 98.7 F (37.1 C) (Oral)  Resp 20  Ht 5' (1.524 m)  Wt 117 lb 11.2 oz (53.388 kg)  BMI 22.99 kg/m2  ECOG FS: 1 - Symptomatic but completely ambulatory  General appearance: Alert, cooperative, well nourished, no apparent distress  Head: Normocephalic, without obvious abnormality, atraumatic, the patient is wearing a hat Eyes: Conjunctivae/corneas clear, PERRLA, EOMI  Nose: Nares, septum and mucosa are normal, no drainage or sinus tenderness.  Neck: No adenopathy, supple, symmetrical, trachea midline, thyroid not enlarged, no tenderness  Resp: Clear to auscultation bilaterally  Cardio: Regular rate and rhythm, S1, S2 normal, no murmur, click, rub or gallop, left chest Port-A-Cath without signs of infection  Breasts: Deferred  GI: Soft, distended, non-tender to palpation, hypoactive bowel sounds, no organomegaly  Skin: No rashes/lesions, skin warm and dry, no erythematous areas, no cyanosis  M/S: Atraumatic, normal strength in all extremities, normal range of motion, no clubbing  Lymph nodes: Cervical, supraclavicular, and axillary nodes normal  Neurologic: Grossly normal, cranial nerves II through XII intact, alert and oriented x 3  Psych: Appropriate affect     LAB RESULTS: Lab Results  Component Value Date   WBC 5.3 09/03/2013   NEUTROABS 2.4 09/03/2013   HGB 11.7 09/03/2013   HCT 35.3 09/03/2013   MCV 82.3 09/03/2013   PLT 245 09/03/2013      Chemistry      Component Value Date/Time   NA 138 09/03/2013 1254   NA 135 08/13/2013 1532   K 3.8 09/03/2013 1254   K 3.7 08/13/2013 1532   CL 99 08/13/2013 1532   CL 106 03/21/2013 1422   CO2 20* 09/03/2013 1254   CO2 22 08/13/2013 1532   BUN 9.6 09/03/2013 1254   BUN 8 08/13/2013 1532   CREATININE 0.7  09/03/2013 1254   CREATININE 0.66 08/13/2013 1532   CREATININE 0.74 03/01/2012 1715      Component Value Date/Time   CALCIUM 9.7 09/03/2013 1254   CALCIUM 9.4 08/13/2013 1532   ALKPHOS 154* 09/03/2013 1254   ALKPHOS 145* 08/13/2013 1532   AST 50* 09/03/2013 1254   AST 27 08/13/2013 1532   ALT 40 09/03/2013 1254   ALT 23 08/13/2013 1532   BILITOT  0.41 09/03/2013 1254   BILITOT 0.3 08/13/2013 1532      Lab Results  Component Value Date   LABCA2 27 12/27/2012     STUDIES: Dg Abd 1 View 08/28/2013   CLINICAL DATA:  Pelvic pain, right lower quadrant pain  EXAM: ABDOMEN - 1 VIEW  COMPARISON:  08/28/2013  FINDINGS: Calcified phleboliths in the anatomic pelvis. Nonobstructive bowel gas pattern. No free air or organomegaly. No acute bony abnormality.  IMPRESSION: No acute findings.   Electronically Signed   By: Charlett Nose M.D.   On: 08/28/2013 14:41   US Abdomen Limited 08/28/2013   CLINICAL DATA:  History of breast carcinoma. Baseline evaluation of liver.  EXAM: US ABDOMEN LIMITED - RIGHT UPPER QUADRANT  COMPARISON:  CT 07/03/2013. PET 03/13/2013.  FINDINGS: Gallbladder  There is cholelithiasis. Numerous calculi are seen within the gallbladder. The largest calculus which could be measured had greatest diameter of 8 mm. No gallbladder wall thickening or pericholecystic fluid was evident. There was no positive sonographic Murphy's sign according to the technologist.  Common bile duct  Diameter: Common bile duct measured 3.0 mm. No choledocholithiasis is evident.  Liver:  Liver appeared normal in size. Parenchyma appear normal echogenicity. No hepatic masses or focal lesions are seen. Portal vein appears patent with hepatopetal flow.  No ascites is evident.  IMPRESSION: Cholelithiasis. No ultrasonic evidence of acute cholecystitis. Normal appearance of bile ducts and liver.   Electronically Signed   By: Onalee Hua  Call M.D.   On: 08/28/2013 12:18     ASSESSMENT: Debbie Larsen is a  62 y.o. [place],  [place] woman originally from [place] with stage IV breast cancer:  (1) Status post right lumpectomy and sentinel lymph node sampling 05/16/2001 for a pT1c pN1, stage IIA invasive ductal carcinoma, grade 2, estrogen receptor 95% and progesterone receptor 96% positive, with an MIB-1 of 11% and no HER-2 amplification by immunocytochemistry.  (2) Treated adjuvantly with cyclophosphamide, methotrexate and fluorouracil, likely for 8 cycles (data unavailable), completed February 2003.  (3) Completed radiation to the right breast and regional lymph nodes 02/12/2002.  (4) The patient apparently refused axillary lymph node dissection and antiestrogen therapy (data unavailable).  (5) Recurrent disease documented by right axillary lymph node biopsy 08/23/2012, showing a high-grade invasive ductal carcinoma which was estrogen and progesterone receptor negative, with an MIB-1 of 59%, and no HER-2 amplification; staging studies showed hyper metabolic mediastinal and left lung lesions, with no involvement of the liver or brain  (6) Abraxane every 2 weeks started 09/20/2012, with good tolerance and evidence of response, completing 12 doses 02/21/2013.  (7)  Restaging studies at [facility] March 2014 and here April-May 2014 as follows:   (a) bone involvement: [facility] bone scan showed focal tracer activity in the left acetabulum, left 12th rib posteriorly, and right sixth rib posteriorly; on CT scan a 9 mm sclerotic sacral lesion was new as compared to November of 2013.  (b) [facility] CT of the chest, abdomen and pelvis showed slight decrease in size of some mediastinal lymph nodes, persistent unchanged nodularity in the right axilla, normal liver, and no new or enlarging pulmonary nodules.  (c) MRI of the cervical spine shows no obvious lesion to explain the patient's Right arm symptoms.  (d) PET scan shows multiple bone lesions (including C7), mild growth in index lymph nodes, no change in possible left lung nodules,  and no liver involvement.  (7) Genetic testing obtained at [facility] 03/19/2013, results pending.  (8) Zolendronate started 02/21/2013, continuing  monthly.   (9) Nonspecific myositis RUE documented at [facility] by EMG March 2014; cervical MRI does not explain symptoms, which are likely due to a right axillary radiculopathy.  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  (11) Elevated liver enzymes, right lower quadrant abdominal discomfort - abdominal x-ray and abdominal ultrasound on 08/28/2013 did not show metastatic disease and are relatively unremarkable.   PLAN:  Debbie Larsen will proceed today with every two-week Abraxane therapy. Monthly Zoledronic Acid therapy is scheduled for 09/05/2013.  Debbie Larsen requested, and Dr. Darnelle Catalan agreed to submit a request for a second opinion by Dr. Si Gaul related to lung nodules.  We will continue to monitor her elevated liver enzymes.  Debbie Larsen was asked to speak to her primary care physician about a referral to a hepatologist or GI specialist if abdominal discomfort continues.  We plan to see Debbie Larsen again on 09/17/2013 at which time she will receive physical assessment and laboratory evaluation prior to proceeding with Abraxane therapy.  Her next Zoledronic Acid therapy is scheduled for 10/03/2013.  All questions answered. Debbie Larsen encouraged to contact us in the interim with any questions, concerns, or problems.  Larina Bras, NP-C 09/04/2013 9:44 AM

## 2013-09-03 NOTE — Patient Instructions (Signed)
Please contact us at [phone] if you have any questions or concerns.  Get plenty of rest, drink plenty of water, exercise daily (walking), eat a balanced diet.  Continue to take calcium and vitamin D daily.  Results for orders placed in visit on 09/03/13 (from the past 24 hour(s))  CBC WITH DIFFERENTIAL     Status: Abnormal   Collection Time    09/03/13 12:54 PM      Result Value Range   WBC 5.3  3.9 - 10.3 10e3/uL   NEUT# 2.4  1.5 - 6.5 10e3/uL   HGB 11.7  11.6 - 15.9 g/dL   HCT 78.2  95.6 - 21.3 %   Platelets 245  145 - 400 10e3/uL   MCV 82.3  79.5 - 101.0 fL   MCH 27.3  25.1 - 34.0 pg   MCHC 33.1  31.5 - 36.0 g/dL   RBC 0.86  5.78 - 4.69 10e6/uL   RDW 14.8 (*) 11.2 - 14.5 %   lymph# 2.2  0.9 - 3.3 10e3/uL   MONO# 0.7  0.1 - 0.9 10e3/uL   Eosinophils Absolute 0.0  0.0 - 0.5 10e3/uL   Basophils Absolute 0.0  0.0 - 0.1 10e3/uL   NEUT% 45.6  38.4 - 76.8 %   LYMPH% 41.3  14.0 - 49.7 %   MONO% 12.3  0.0 - 14.0 %   EOS% 0.6  0.0 - 7.0 %   BASO% 0.2  0.0 - 2.0 %   nRBC 0  0 - 0 %   Narrative:    Performed At:  [facility]               [address]               [place], [place] [address]

## 2013-09-03 NOTE — Patient Instructions (Addendum)
[facility] Discharge Instructions for Patients Receiving Chemotherapy  Today you received the following chemotherapy agent abraxane.  To help prevent nausea and vomiting after your treatment, we encourage you to take your nausea medication.   If you develop nausea and vomiting that is not controlled by your nausea medication, call the clinic.   BELOW ARE SYMPTOMS THAT SHOULD BE REPORTED IMMEDIATELY:  *FEVER GREATER THAN 100.5 F  *CHILLS WITH OR WITHOUT FEVER  NAUSEA AND VOMITING THAT IS NOT CONTROLLED WITH YOUR NAUSEA MEDICATION  *UNUSUAL SHORTNESS OF BREATH  *UNUSUAL BRUISING OR BLEEDING  TENDERNESS IN MOUTH AND THROAT WITH OR WITHOUT PRESENCE OF ULCERS  *URINARY PROBLEMS  *BOWEL PROBLEMS  UNUSUAL RASH Items with * indicate a potential emergency and should be followed up as soon as possible.  Feel free to call the clinic you have any questions or concerns. The clinic phone number is [phone].

## 2013-09-04 ENCOUNTER — Encounter: Payer: Self-pay | Admitting: Family

## 2013-09-04 ENCOUNTER — Telehealth: Payer: Self-pay | Admitting: *Deleted

## 2013-09-04 NOTE — Telephone Encounter (Signed)
Per patient request I have moved her appt from tomorrow to next week

## 2013-09-05 ENCOUNTER — Ambulatory Visit: Payer: Medicaid Other

## 2013-09-05 ENCOUNTER — Other Ambulatory Visit: Payer: Self-pay | Admitting: Certified Registered Nurse Anesthetist

## 2013-09-09 ENCOUNTER — Telehealth: Payer: Self-pay | Admitting: Internal Medicine

## 2013-09-09 NOTE — Telephone Encounter (Signed)
S/w pt and gve np appt 11/20 @ 12 w/Dr. Arbutus Ped. Calendar Mailed.

## 2013-09-10 ENCOUNTER — Ambulatory Visit (HOSPITAL_BASED_OUTPATIENT_CLINIC_OR_DEPARTMENT_OTHER): Payer: Medicaid Other

## 2013-09-10 VITALS — BP 113/62 | HR 89 | Temp 99.1°F | Resp 20

## 2013-09-10 DIAGNOSIS — C50219 Malignant neoplasm of upper-inner quadrant of unspecified female breast: Secondary | ICD-10-CM

## 2013-09-10 DIAGNOSIS — M8580 Other specified disorders of bone density and structure, unspecified site: Secondary | ICD-10-CM

## 2013-09-10 DIAGNOSIS — C7951 Secondary malignant neoplasm of bone: Secondary | ICD-10-CM

## 2013-09-10 MED ORDER — ZOLEDRONIC ACID 4 MG/100ML IV SOLN
4.0000 mg | Freq: Once | INTRAVENOUS | Status: AC
  Administered 2013-09-10: 4 mg via INTRAVENOUS
  Filled 2013-09-10: qty 100

## 2013-09-10 NOTE — Patient Instructions (Signed)

## 2013-09-12 ENCOUNTER — Other Ambulatory Visit: Payer: Self-pay | Admitting: Physician Assistant

## 2013-09-16 ENCOUNTER — Other Ambulatory Visit: Payer: Self-pay | Admitting: Physician Assistant

## 2013-09-16 DIAGNOSIS — C50919 Malignant neoplasm of unspecified site of unspecified female breast: Secondary | ICD-10-CM

## 2013-09-16 DIAGNOSIS — C7951 Secondary malignant neoplasm of bone: Secondary | ICD-10-CM

## 2013-09-17 ENCOUNTER — Other Ambulatory Visit (HOSPITAL_BASED_OUTPATIENT_CLINIC_OR_DEPARTMENT_OTHER): Payer: Medicaid Other | Admitting: Lab

## 2013-09-17 ENCOUNTER — Ambulatory Visit (HOSPITAL_BASED_OUTPATIENT_CLINIC_OR_DEPARTMENT_OTHER): Payer: Medicaid Other

## 2013-09-17 ENCOUNTER — Ambulatory Visit (HOSPITAL_BASED_OUTPATIENT_CLINIC_OR_DEPARTMENT_OTHER): Payer: Medicaid Other | Admitting: Family

## 2013-09-17 ENCOUNTER — Encounter: Payer: Self-pay | Admitting: Family

## 2013-09-17 VITALS — BP 121/72 | HR 84 | Temp 98.7°F | Resp 20 | Ht 60.0 in | Wt 118.9 lb

## 2013-09-17 DIAGNOSIS — C7951 Secondary malignant neoplasm of bone: Secondary | ICD-10-CM

## 2013-09-17 DIAGNOSIS — C50919 Malignant neoplasm of unspecified site of unspecified female breast: Secondary | ICD-10-CM

## 2013-09-17 DIAGNOSIS — C50219 Malignant neoplasm of upper-inner quadrant of unspecified female breast: Secondary | ICD-10-CM

## 2013-09-17 DIAGNOSIS — Z5111 Encounter for antineoplastic chemotherapy: Secondary | ICD-10-CM

## 2013-09-17 DIAGNOSIS — R911 Solitary pulmonary nodule: Secondary | ICD-10-CM

## 2013-09-17 DIAGNOSIS — R1031 Right lower quadrant pain: Secondary | ICD-10-CM

## 2013-09-17 LAB — COMPREHENSIVE METABOLIC PANEL (CC13)
AST: 33 U/L (ref 5–34)
Albumin: 3.9 g/dL (ref 3.5–5.0)
Alkaline Phosphatase: 138 U/L (ref 40–150)
BUN: 9.1 mg/dL (ref 7.0–26.0)
CO2: 21 mEq/L — ABNORMAL LOW (ref 22–29)
Calcium: 9.2 mg/dL (ref 8.4–10.4)
Chloride: 106 mEq/L (ref 98–109)
Creatinine: 0.7 mg/dL (ref 0.6–1.1)
Glucose: 82 mg/dl (ref 70–140)
Potassium: 4 mEq/L (ref 3.5–5.1)
Sodium: 137 mEq/L (ref 136–145)
Total Protein: 7.2 g/dL (ref 6.4–8.3)

## 2013-09-17 LAB — CBC WITH DIFFERENTIAL/PLATELET
BASO%: 0.5 % (ref 0.0–2.0)
Basophils Absolute: 0 10*3/uL (ref 0.0–0.1)
EOS%: 0.7 % (ref 0.0–7.0)
Eosinophils Absolute: 0 10*3/uL (ref 0.0–0.5)
HCT: 33.4 % — ABNORMAL LOW (ref 34.8–46.6)
HGB: 11 g/dL — ABNORMAL LOW (ref 11.6–15.9)
LYMPH%: 40 % (ref 14.0–49.7)
MCH: 27 pg (ref 25.1–34.0)
MCHC: 32.9 g/dL (ref 31.5–36.0)
MCV: 81.9 fL (ref 79.5–101.0)
MONO#: 0.6 10*3/uL (ref 0.1–0.9)
NEUT#: 2 10*3/uL (ref 1.5–6.5)
NEUT%: 45.9 % (ref 38.4–76.8)
RDW: 15 % — ABNORMAL HIGH (ref 11.2–14.5)
WBC: 4.4 10*3/uL (ref 3.9–10.3)
lymph#: 1.8 10*3/uL (ref 0.9–3.3)

## 2013-09-17 MED ORDER — SODIUM CHLORIDE 0.9 % IJ SOLN
10.0000 mL | INTRAMUSCULAR | Status: DC | PRN
  Administered 2013-09-17: 10 mL
  Filled 2013-09-17: qty 10

## 2013-09-17 MED ORDER — ONDANSETRON 8 MG/50ML IVPB (CHCC)
8.0000 mg | Freq: Once | INTRAVENOUS | Status: AC
  Administered 2013-09-17: 8 mg via INTRAVENOUS

## 2013-09-17 MED ORDER — HEPARIN SOD (PORK) LOCK FLUSH 100 UNIT/ML IV SOLN
500.0000 [IU] | Freq: Once | INTRAVENOUS | Status: AC | PRN
  Administered 2013-09-17: 500 [IU]
  Filled 2013-09-17: qty 5

## 2013-09-17 MED ORDER — PACLITAXEL PROTEIN-BOUND CHEMO INJECTION 100 MG
100.0000 mg/m2 | Freq: Once | INTRAVENOUS | Status: AC
  Administered 2013-09-17: 150 mg via INTRAVENOUS
  Filled 2013-09-17: qty 30

## 2013-09-17 MED ORDER — SODIUM CHLORIDE 0.9 % IV SOLN
Freq: Once | INTRAVENOUS | Status: AC
  Administered 2013-09-17: 16:00:00 via INTRAVENOUS

## 2013-09-17 MED ORDER — DEXAMETHASONE SODIUM PHOSPHATE 10 MG/ML IJ SOLN
INTRAMUSCULAR | Status: AC
  Filled 2013-09-17: qty 1

## 2013-09-17 MED ORDER — DEXAMETHASONE SODIUM PHOSPHATE 10 MG/ML IJ SOLN
10.0000 mg | Freq: Once | INTRAMUSCULAR | Status: AC
  Administered 2013-09-17: 10 mg via INTRAVENOUS

## 2013-09-17 NOTE — Patient Instructions (Addendum)
Please contact us at [phone] if you have any questions or concerns.  Please continue to do well and enjoy life!!!  Get plenty of rest, drink plenty of water, exercise daily (walking as tolerated), eat a balanced diet.  Take D3 1000 IUs daily.   Complete monthly self-breast examinations.  Have a clinical breast exam by a physician every year.  Have your mammogram completed every year.  Results for orders placed in visit on 09/17/13 (from the past 24 hour(s))  CBC WITH DIFFERENTIAL     Status: Abnormal   Collection Time    09/17/13  2:26 PM      Result Value Range   WBC 4.4  3.9 - 10.3 10e3/uL   NEUT# 2.0  1.5 - 6.5 10e3/uL   HGB 11.0 (*) 11.6 - 15.9 g/dL   HCT 06.2 (*) 69.4 - 85.4 %   Platelets 226  145 - 400 10e3/uL   MCV 81.9  79.5 - 101.0 fL   MCH 27.0  25.1 - 34.0 pg   MCHC 32.9  31.5 - 36.0 g/dL   RBC 6.27  0.35 - 0.09 10e6/uL   RDW 15.0 (*) 11.2 - 14.5 %   lymph# 1.8  0.9 - 3.3 10e3/uL   MONO# 0.6  0.1 - 0.9 10e3/uL   Eosinophils Absolute 0.0  0.0 - 0.5 10e3/uL   Basophils Absolute 0.0  0.0 - 0.1 10e3/uL   NEUT% 45.9  38.4 - 76.8 %   LYMPH% 40.0  14.0 - 49.7 %   MONO% 12.9  0.0 - 14.0 %   EOS% 0.7  0.0 - 7.0 %   BASO% 0.5  0.0 - 2.0 %   nRBC 0  0 - 0 %   Narrative:    Performed At:  [facility]               [address]               [place], [place] [address]

## 2013-09-17 NOTE — Patient Instructions (Signed)
[facility] Discharge Instructions for Patients Receiving Chemotherapy  Today you received the following chemotherapy agents abrxane.  To help prevent nausea and vomiting after your treatment, we encourage you to take your nausea medication zofran.   If you develop nausea and vomiting that is not controlled by your nausea medication, call the clinic.   BELOW ARE SYMPTOMS THAT SHOULD BE REPORTED IMMEDIATELY:  *FEVER GREATER THAN 100.5 F  *CHILLS WITH OR WITHOUT FEVER  NAUSEA AND VOMITING THAT IS NOT CONTROLLED WITH YOUR NAUSEA MEDICATION  *UNUSUAL SHORTNESS OF BREATH  *UNUSUAL BRUISING OR BLEEDING  TENDERNESS IN MOUTH AND THROAT WITH OR WITHOUT PRESENCE OF ULCERS  *URINARY PROBLEMS  *BOWEL PROBLEMS  UNUSUAL RASH Items with * indicate a potential emergency and should be followed up as soon as possible.  Feel free to call the clinic you have any questions or concerns. The clinic phone number is [phone].

## 2013-09-17 NOTE — Progress Notes (Signed)
[facility]  [phone] [phone]  OFFICE PROGRESS NOTE   ID: Debbie Larsen   DOB: 1951-04-01  MR#: [id number]  JYN#:[id number]    PCP: Rodman Pickle, MD SU: [name], MD CARDIO: Nicholes Mango, MD RAD ONC: [name], MD   [facility] HEME/ONC:  Kandice Hams, MD NEURO:  Maeola Harman, MD   HISTORY OF PRESENT ILLNESS: The patient's 2002-2004 records have been retrieved. The patient underwent a right breast core biopsy 03/29/2001 for an invasive ductal carcinoma associated with necrosis ([id number]).  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 The tumor was estrogen receptor 95% positive, progesterone receptor 96% positive, with an MIB-1 of 11%, and no HER-2 amplification by the HercepTest. She completed 6 cycles of CMF chemotherapy on 12/12/2001. There were minor delays, but no dose reductions. She received radiation to the right breast and regional lymph nodes to a total of 5040 cGy with a right breast boost of 12 [name], completed 02/12/2002  More recently, "Debbie Larsen" develop some right shoulder pain sometime in late 2012. She had followup mammograms of until 2011. There was no mammogram performed in 2012. She was seen by physical therapy in orthopedic surgery for evaluation of her right shoulder pain. She had a mammogram on 07/24/2012 which showed a suspicious mass behind eczema of the right breast. Physical exam the time did show multiple involved right axillary lymph nodes. 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 HER-2 nonamplified the ratio by [facility] being 1.43. Proliferative index was  50%.   MRI scan of both breasts performed on 08/31/2012 showed multiple involved lymph nodes in the right axilla, no obvious  masses in either breast. Unfortunately staging PET scan 09/12/2012 showed in addition to her right axillary recurrence, bilateral mediastinal and left lung nodules consistent with stage IV disease. Her subsequent history is as detailed below.   INTERVAL HISTORY: Debbie Larsen returns today for followup of her metastatic breast cancer.  She states she is feeling a bit of a headache since the recent cold weather.  Otherwise her interval history is stable.   REVIEW OF SYSTEMS: A 10 point review of systems was completed and is negative except for ongoing right lower quadrant/right inguinal discomfort.  Debbie Larsen has not had recent pelvic exam by GYN, asked her to complete this.  She also has complaints of right hand neuropathy for which she is already had physical therapy.  Debbie Larsen denies any other symptomatology including fatigue, fever or chills, headache, vision changes, swollen glands, cough or shortness of breath, chest pain or discomfort, nausea, vomiting, diarrhea, constipation, change in urinary or bowel habits, any other arthralgias/myalgias, unusual bleeding/bruising or any other symptomatology.  A detailed review of systems today was otherwise unremarkable.   PAST MEDICAL HISTORY: Past Medical History  Diagnosis Date  . 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 Hypothyroid   . Enlarged lymph nodes   . BREAST CANCER 04/2001    s/p radiation therapy, chemotherapy and lumpectomy  . 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  . S/P radiation therapy     Right breast/regional lymph nodes/ 5040 cGy / 28 fractions/ bost of 1200 cGy/ 6 Fractions  . Status post chemotherapy  6 cycles of CMF completed on 12/12/2001  . Right shoulder pain     started late 2012  . Dry cough     intermittent  . SOB (shortness of breath)   . Use of letrozole (Femara) 03/14/2013  . Metastasis to bone 03/13/13    PET scan results  . Metastasis  to bone     breast primary  . Cholelithiasis     PAST SURGICAL HISTORY: Past Surgical History  Procedure Laterality Date  . Tonsillectomy    . Breast lumpectomy  04/2001    excision of L breast fibroadenoma by Dr.   . Breast lumpectomy  05/16/2001    Right axillary sentinel lymph node biopsy.  . Portacath placement  09/19/2012    Procedure: INSERTION PORT-A-CATH;  Surgeon: [name], MD;  Location: [facility] OR;  Service: General;  Laterality: N/A;  . Lipoma excision Right 05/15/2013    Procedure: RIGHT AXILLARY LYMPH NODE DISSECTION;  Surgeon: [name], MD;  Location: [facility] ORS;  Service: General;  Laterality: Right;    FAMILY HISTORY Family History  Problem Relation Age of Onset  . Diabetes Mother   . Hypertension Father   . Hypertension Sister   . Hypothyroidism Brother   . Cancer Neg Hx   The patient's father died from heart disease at the age of 59. The patient's mother died in July 26, 2012 at the age of 27 also from heart disease. The patient has 2 sisters and 2 brothers. There is no history of breast or ovarian cancer in the family to her knowledge.   GYNECOLOGIC HISTORY: G1 P1, first live birth age 68. She underwent menopause at the time of her chemotherapy in 2002. She did not use hormone replacement.   SOCIAL HISTORY: The patient is divorced and lives by herself. Her son Iness Pangilinan lives in [place], where he works as an Publishing copy. The patient tells me her son lives with 3 roommates" there is no room for me there". (Accordingly going to [employer] for studies is not an option for her). The patient has no grandchildren. She attends the local [employer].    ADVANCED DIRECTIVES: The patient tells me (11/01/2012) her healthcare power of attorney is her sister [name]. She was unable to give me her contact information.   HEALTH MAINTENANCE: History  Substance Use Topics  . Smoking status: Never Smoker   . Smokeless tobacco: Never Used  . Alcohol  Use: No     Colonoscopy: None file  PAP: 08/17/2012  Bone density: The patient's last bone density scan on 07/24/2012 showed a T score of -1.7 (osteopenia).  Lipid panel: Not on file   No Known Allergies  Current Outpatient Prescriptions  Medication Sig Dispense Refill  . B Complex-C (B-COMPLEX WITH VITAMIN C) tablet Take 1 tablet by mouth daily.      . calcium-vitamin D (OSCAL WITH D) 500-200 MG-UNIT per tablet Take 1 tablet by mouth daily with breakfast.      . levothyroxine (SYNTHROID, LEVOTHROID) 75 MCG tablet Take 75 mcg by mouth every evening.      . lidocaine-prilocaine (EMLA) cream Apply topically as needed. Apply to port 1-2 hours before procedure  30 g  0  . Multiple Vitamin (MULTIVITAMIN WITH MINERALS) TABS Take 1 tablet by mouth daily.      . ondansetron (ZOFRAN) 4 MG tablet Take one tablet twice daily (8 AM and 6 PM) beginning the evening of chemotherapy and continuing for 2 days  30 tablet  4  . prochlorperazine (COMPAZINE) 5 MG tablet Take 1 tablet (5 mg total) by mouth every 6 (six) hours as needed for nausea (take before meals and at bedtime starting the evening of chemotherapy and contune for 2 days).  30 tablet  6   No current facility-administered medications for this visit.   Facility-Administered Medications Ordered in Other Visits  Medication Dose Route Frequency Provider Last Rate Last Dose  . heparin lock flush 100 unit/mL  500 Units Intravenous Once Lowella Dell, MD      . sodium chloride 0.9 % injection 10 mL  10 mL Intravenous PRN Lowella Dell, MD      . sodium chloride 0.9 % injection 10 mL  10 mL Intravenous PRN Lowella Dell, MD   10 mL at 07/09/13 1717  . sodium chloride 0.9 % injection 10 mL  10 mL Intracatheter PRN Lowella Dell, MD   10 mL at 09/03/13 1644   Objective: Middle-aged [place] Asian woman who appears well: BP 121/72  Pulse 84  Temp(Src) 98.7 F (37.1 C) (Oral)  Resp 20  Ht 5' (1.524 m)  Wt 118 lb 14.4 oz (53.933 kg)   BMI 23.22 kg/m2  ECOG FS: 1 - Symptomatic but completely ambulatory  General appearance: Alert, cooperative, well nourished, no apparent distress  Head: Normocephalic, without obvious abnormality, atraumatic, the patient is wearing a hat Eyes: Conjunctivae/corneas clear, PERRLA, EOMI  Nose: Nares, septum and mucosa are normal, no drainage or sinus tenderness.  Neck: No adenopathy, supple, symmetrical, trachea midline, thyroid not enlarged, no tenderness  Resp: Clear to auscultation bilaterally  Cardio: Regular rate and rhythm, S1, S2 normal, no murmur, click, rub or gallop, left chest Port-A-Cath covered in EMLA cream with occlusive dressing  Breasts: Deferred  GI: Soft, distended, non-tender to palpation, hypoactive bowel sounds, no organomegaly  Skin: No rashes/lesions, skin warm and dry, no erythematous areas, no cyanosis  M/S: Atraumatic, normal strength in all extremities, normal range of motion, no clubbing  Lymph nodes: Cervical, supraclavicular, and axillary nodes normal  Neurologic: Grossly normal, cranial nerves II through XII intact, alert and oriented x 3  Psych: Appropriate affect   LAB RESULTS: Lab Results  Component Value Date   WBC 4.4 09/17/2013   NEUTROABS 2.0 09/17/2013   HGB 11.0* 09/17/2013   HCT 33.4* 09/17/2013   MCV 81.9 09/17/2013   PLT 226 09/17/2013      Chemistry      Component Value Date/Time   NA 137 09/17/2013 1427   NA 135 08/13/2013 1532   K 4.0 09/17/2013 1427   K 3.7 08/13/2013 1532   CL 99 08/13/2013 1532   CL 106 03/21/2013 1422   CO2 21* 09/17/2013 1427   CO2 22 08/13/2013 1532   BUN 9.1 09/17/2013 1427   BUN 8 08/13/2013 1532   CREATININE 0.7 09/17/2013 1427   CREATININE 0.66 08/13/2013 1532   CREATININE 0.74 03/01/2012 1715      Component Value Date/Time   CALCIUM 9.2 09/17/2013 1427   CALCIUM 9.4 08/13/2013 1532   ALKPHOS 138 09/17/2013 1427   ALKPHOS 145* 08/13/2013 1532   AST 33 09/17/2013 1427   AST 27 08/13/2013 1532    ALT 28 09/17/2013 1427   ALT 23 08/13/2013 1532   BILITOT 0.35 09/17/2013 1427   BILITOT 0.3 08/13/2013 1532      Lab Results  Component Value Date   LABCA2 27 12/27/2012     STUDIES: No results found.   ASSESSMENT: Debbie Larsen  D Spellman is a  62 y.o. [place], [place] woman originally from [place] with stage IV breast cancer:  (1) Status post right lumpectomy and sentinel lymph node sampling 05/16/2001 for a pT1c pN1, stage IIA invasive ductal carcinoma, grade 2, estrogen receptor 95% and progesterone receptor 96% positive, with an MIB-1 of 11% and no HER-2 amplification by immunocytochemistry.  (2) Treated adjuvantly with cyclophosphamide, methotrexate and fluorouracil, likely for 8 cycles (data unavailable), completed February 2003.  (3) Completed radiation to the right breast and regional lymph nodes 02/12/2002.  (4) The patient apparently refused axillary lymph node dissection and antiestrogen therapy (data unavailable).  (5) Recurrent disease documented by right axillary lymph node biopsy 08/23/2012, showing a high-grade invasive ductal carcinoma which was estrogen and progesterone receptor negative, with an MIB-1 of 59%, and no HER-2 amplification; staging studies showed hyper metabolic mediastinal and left lung lesions, with no involvement of the liver or brain  (6) Abraxane every 2 weeks started 09/20/2012, with good tolerance and evidence of response, completing 12 doses 02/21/2013.  (7)  Restaging studies at [facility] March 2014 and here April-May 2014 as follows:   (a) bone involvement: [facility] bone scan showed focal tracer activity in the left acetabulum, left 12th rib posteriorly, and right sixth rib posteriorly; on CT scan a 9 mm sclerotic sacral lesion was new as compared to November of 2013.  (b) [facility] CT of the chest, abdomen and pelvis showed slight decrease in size of some mediastinal lymph nodes, persistent unchanged nodularity in the right axilla, normal liver, and no  new or enlarging pulmonary nodules.  (c) MRI of the cervical spine shows no obvious lesion to explain the patient's Right arm symptoms.  (d) PET scan shows multiple bone lesions (including C7), mild growth in index lymph nodes, no change in possible left lung nodules, and no liver involvement.  (7) Genetic testing obtained at [facility] 03/19/2013, results pending.  (8) Zolendronate started 02/21/2013, continuing monthly.   (9) Nonspecific myositis RUE documented at [facility] by EMG March 2014; cervical MRI does not explain symptoms, which are likely due to a right axillary radiculopathy.  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  (11) Elevated liver enzymes (resolved)  - abdominal x-ray and abdominal ultrasound on 08/28/2013 did not show metastatic disease and are relatively unremarkable.   PLAN:  Ms. Clelia Croft will proceed today with every two-week Abraxane therapy. Monthly Zoledronic Acid therapy is scheduled for 10/03/2013.  Ms. Clelia Croft is scheduled to see Dr. Si Gaul on 09/19/2013 for a second opinion related to her lung nodules.  Elevated liver enzymes have resolved.  Ms. Gavel was asked to speak to her primary care physician about a referral to a hepatologist or GI specialist if she has any further abdominal discomfort.  We plan to see Ms. Dalpe again on 10/01/2013 at which time she will receive physical assessment and laboratory evaluation prior to proceeding with Abraxane therapy.  Her next Zoledronic Acid therapy is scheduled for 10/03/2013.  All questions answered. Ms. Palermo encouraged to contact us in the interim with any questions, concerns, or problems.  Larina Bras, NP-C 09/19/2013 5:49 PM

## 2013-09-19 ENCOUNTER — Encounter: Payer: Self-pay | Admitting: *Deleted

## 2013-09-19 ENCOUNTER — Encounter: Payer: Self-pay | Admitting: Internal Medicine

## 2013-09-19 ENCOUNTER — Telehealth: Payer: Self-pay | Admitting: Internal Medicine

## 2013-09-19 ENCOUNTER — Telehealth (INDEPENDENT_AMBULATORY_CARE_PROVIDER_SITE_OTHER): Payer: Self-pay | Admitting: General Surgery

## 2013-09-19 ENCOUNTER — Ambulatory Visit (HOSPITAL_BASED_OUTPATIENT_CLINIC_OR_DEPARTMENT_OTHER): Payer: Medicaid Other | Admitting: Internal Medicine

## 2013-09-19 VITALS — BP 115/57 | HR 83 | Temp 98.3°F | Resp 18 | Ht 60.0 in | Wt 119.9 lb

## 2013-09-19 DIAGNOSIS — C801 Malignant (primary) neoplasm, unspecified: Secondary | ICD-10-CM

## 2013-09-19 DIAGNOSIS — C773 Secondary and unspecified malignant neoplasm of axilla and upper limb lymph nodes: Secondary | ICD-10-CM

## 2013-09-19 DIAGNOSIS — C3490 Malignant neoplasm of unspecified part of unspecified bronchus or lung: Secondary | ICD-10-CM

## 2013-09-19 DIAGNOSIS — C7800 Secondary malignant neoplasm of unspecified lung: Secondary | ICD-10-CM

## 2013-09-19 DIAGNOSIS — Z853 Personal history of malignant neoplasm of breast: Secondary | ICD-10-CM

## 2013-09-19 NOTE — Progress Notes (Signed)
[facility] Clinical Social Work  Clinical Social Work was referred by patient for assessment of psychosocial needs due to financial concerns.  Clinical Social Worker met with patient at [facility]  to offer support and assess for needs. Pt had many questions about social security retirement and social security disability. Pt was somewhat confused how each program worked and the benefits of each. CSW explained each in detail and attempted to provide Pt the ability to make an informed decision on how to proceed. Pt felt she could take early social security. Pt aware how to reach social security office and agrees to seek out assistance as needed.      Clinical Social Work interventions: Comptroller referral Supportive listening and support.  Doreen Salvage, LCSW Clinical Social Worker [facility] Wednesday, Thursday and Friday Phone: [phone] Fax: [phone]

## 2013-09-19 NOTE — Telephone Encounter (Signed)
Gave pt appt for lab and MD for December 2014 emailed michelle regarding chemo

## 2013-09-19 NOTE — Telephone Encounter (Signed)
Appt time moved with patient on 10/09/2013.

## 2013-09-20 ENCOUNTER — Telehealth: Payer: Self-pay | Admitting: Medical Oncology

## 2013-09-20 ENCOUNTER — Telehealth: Payer: Self-pay | Admitting: Internal Medicine

## 2013-09-20 NOTE — Telephone Encounter (Signed)
I calrified with pt about appts . She is trying to get scan before MD visit with [facility].

## 2013-09-20 NOTE — Telephone Encounter (Signed)
Talked to pt , gave her appt for lab and md and chemo for december 2014

## 2013-09-20 NOTE — Telephone Encounter (Signed)
I contacted [place] to put pt on wait list for PET scan before dec 2.

## 2013-09-22 NOTE — Patient Instructions (Signed)
I ordered a PET scan as well as MRI of the brain for restaging of her disease. Followup visit in 2 weeks for reevaluation.

## 2013-09-22 NOTE — Progress Notes (Addendum)
[facility] [phone]   [phone]  OFFICE PROGRESS NOTE  Rodman Pickle, MD [address] 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 2) history of right breast adenocarcinoma with positive ER/PR diagnosed in July of 2002.  MOLECULAR BIOMARKERS: The metastatic high-grade carcinoma diagnosed in October of 2013 was negative for ER, PR and HER-2. Next generation sequencing showed the tumor is positive for EGFR mutation [id number] in exon 19, SMARCB1, PIK3CA and HRAS>  PRIOR THERAPY: 1) status post right lumpectomy in July of 2002. 2) status post systemic chemotherapy with CMF for a total of 6 cycles completed 12/12/2001. 3) status post radiotherapy to the right breast and regional lymph nodes for a total dose of 5040 cGY with a boost of 1200 cGY completed in February 12, 2002. 4) the patient presented with right axillary lymphadenopathy in November of 2013 and excisional biopsy showed high grade carcinoma of unknown primary. PET scan at that time showed the right axillary lymphadenopathy in addition to bilateral mediastinum and left lung nodules. 5) She was treated as breast cancer with Abraxane until April of 2014. Repeat PET scan on 03/13/2013 showed evidence for disease progression.  CURRENT THERAPY: Abraxane 100 mg/M2 on days 1 and 8 every 3 weeks, status post 7 cycles.  INTERVAL HISTORY: Debbie Larsen 62 y.o. female returns to the clinic today for second opinion visit. She is a very interesting 62 years old and female who was diagnosed with right breast adenocarcinoma in July of 2002. 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  The tumor was estrogen receptor 95% positive, progesterone receptor 96% positive, with an MIB-1 of 11%, and no HER-2 amplification by the HercepTest. The patient completed 6 cycles of adjuvant chemotherapy was CMF completed 12/12/2001 followed by adjuvant radiotherapy to the right breast and right axilla completed in April 2003. The patient was followed at that time by Dr. Donnie Coffin and am not sure how long she was treated with hormonal therapy as no records are available to me. In September of 2013, she presented with right shoulder pain. Mammogram on September of 2016 showed suspicious mass in the right axilla and there was also multiple palpable lymph nodes in the right axilla. Excisional biopsy was performed on 08/23/2013 and it showed high-grade invasive carcinoma. Immunohistochemical stains that were performed and showed the malignant cells were positive for cytokeratin AE1/AE3 and cytokeratin 7 but negative for cytokeratin 20, CD45, HMB-45, Melan-A and S100. The tumor cells were negative for ER, PR as well as HER-2.  PET scan at that time showed in additional to the right axillary lymphadenopathy there was bilateral mediastinal and left lung nodules. The patient was started on treatment with Abraxane but repeat PET scan on 03/13/2013 showed evidence for disease progression. marker was performed by Bio T-3 next generation sequencing showed the tumor cells were positive for EGFR mutation in exon 19, SMARCB!, PIK3CA and HRAS.  The patient continued on treatment with Abraxane 100 mg/M2 on days 1 and 8 every 3 weeks, currently status post 7 cycles. Last imaging studies with CT scan of the chest on 07/03/2013 and it showed multiple bilateral pulmonary nodules concerning for metastatic disease and  at least of the larger lesions appeared new or enlarged. The patient had stable mediastinal lymphadenopathy and the previously noted enlarged right axillary lymph node is not identified.  Because of the EGFR mutation, the  patient requested a second opinion with me for evaluation and discussion of her treatment options. She is feeling fine today with no specific complaints. She was very anxious and has a lot of questions about her condition. She denied having any significant chest pain, shortness of breath, cough or hemoptysis. She has no nausea or vomiting. No fever or chills.  MEDICAL HISTORY: Past Medical History  Diagnosis Date  . 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 Hypothyroid   . Enlarged lymph nodes   . BREAST CANCER 04/2001    s/p radiation therapy, chemotherapy and lumpectomy  . 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  . S/P radiation therapy     Right breast/regional lymph nodes/ 5040 cGy / 28 fractions/ bost of 1200 cGy/ 6 Fractions  . Status post chemotherapy     6 cycles of CMF completed on 12/12/2001  . Right shoulder pain     started late 2012  . Dry cough     intermittent  . SOB (shortness of breath)   . Use of letrozole (Femara) 03/14/2013  . Metastasis to bone 03/13/13    PET scan results  . Metastasis to bone     breast primary  . Cholelithiasis     ALLERGIES:  has No Known Allergies.  MEDICATIONS:  Current Outpatient Prescriptions  Medication Sig Dispense Refill  . B Complex-C (B-COMPLEX WITH VITAMIN C) tablet Take 1 tablet by mouth daily.      . calcium-vitamin D (OSCAL WITH D) 500-200 MG-UNIT per tablet Take 1 tablet by mouth daily with breakfast.      . levothyroxine (SYNTHROID, LEVOTHROID) 75 MCG tablet Take 75 mcg by mouth every evening.      . lidocaine-prilocaine (EMLA) cream Apply topically as needed. Apply to port 1-2 hours before procedure  30 g  0  . Multiple Vitamin (MULTIVITAMIN WITH MINERALS) TABS Take 1 tablet by mouth daily.      . ondansetron (ZOFRAN) 4 MG tablet Take one tablet twice daily (8 AM and 6 PM) beginning the evening of chemotherapy and continuing for 2 days  30 tablet  4   . prochlorperazine (COMPAZINE) 5 MG tablet Take 1 tablet (5 mg total) by mouth every 6 (six) hours as needed for nausea (take before meals and at bedtime starting the evening of chemotherapy and contune for 2 days).  30 tablet  6   No current facility-administered medications for this visit.   Facility-Administered Medications Ordered in Other Visits  Medication Dose Route Frequency Provider Last Rate Last Dose  . heparin lock flush 100 unit/mL  500 Units Intravenous Once Lowella Dell, MD      . sodium chloride 0.9 % injection 10 mL  10 mL Intravenous PRN Lowella Dell, MD      . sodium chloride 0.9 % injection 10 mL  10 mL Intravenous PRN Lowella Dell, MD   10 mL at 07/09/13 1717  . sodium chloride 0.9 % injection 10 mL  10 mL Intracatheter PRN Lowella Dell, MD   10 mL at 09/03/13 1644    SURGICAL HISTORY:  Past Surgical History  Procedure Laterality Date  . Tonsillectomy    . Breast lumpectomy  04/2001  excision of L breast fibroadenoma by Dr.   . Breast lumpectomy  05/16/2001    Right axillary sentinel lymph node biopsy.  . Portacath placement  09/19/2012    Procedure: INSERTION PORT-A-CATH;  Surgeon: [name], MD;  Location: [facility] OR;  Service: General;  Laterality: N/A;  . Lipoma excision Right 05/15/2013    Procedure: RIGHT AXILLARY LYMPH NODE DISSECTION;  Surgeon: [name], MD;  Location: [facility] ORS;  Service: General;  Laterality: Right;    REVIEW OF SYSTEMS:  Constitutional: negative Eyes: negative Ears, nose, mouth, throat, and face: negative Respiratory: negative Cardiovascular: negative Gastrointestinal: negative Genitourinary:negative Integument/breast: negative Hematologic/lymphatic: negative Musculoskeletal:negative Neurological: negative Behavioral/Psych: negative Endocrine: negative Allergic/Immunologic: negative   PHYSICAL EXAMINATION: General appearance: alert, cooperative and no distress Head: Normocephalic, without  obvious abnormality, atraumatic Neck: no adenopathy, no carotid bruit, no JVD, supple, symmetrical, trachea midline and thyroid not enlarged, symmetric, no tenderness/mass/nodules Lymph nodes: Cervical, supraclavicular, and axillary nodes normal. Resp: clear to auscultation bilaterally and normal percussion bilaterally Back: negative, no kyphosis present, symmetric, no curvature. ROM normal. No CVA tenderness. Cardio: regular rate and rhythm, S1, S2 normal, no murmur, click, rub or gallop GI: soft, non-tender; bowel sounds normal; no masses,  no organomegaly Extremities: extremities normal, atraumatic, no cyanosis or edema Neurologic: Alert and oriented X 3, normal strength and tone. Normal symmetric reflexes. Normal coordination and gait  ECOG PERFORMANCE STATUS: 0 - Asymptomatic  Blood pressure 115/57, pulse 83, temperature 98.3 F (36.8 C), temperature source Oral, resp. rate 18, height 5' (1.524 m), weight 119 lb 14.4 oz (54.386 kg), SpO2 100.00%.  LABORATORY DATA: Lab Results  Component Value Date   WBC 4.4 09/17/2013   HGB 11.0* 09/17/2013   HCT 33.4* 09/17/2013   MCV 81.9 09/17/2013   PLT 226 09/17/2013      Chemistry      Component Value Date/Time   NA 137 09/17/2013 1427   NA 135 08/13/2013 1532   K 4.0 09/17/2013 1427   K 3.7 08/13/2013 1532   CL 99 08/13/2013 1532   CL 106 03/21/2013 1422   CO2 21* 09/17/2013 1427   CO2 22 08/13/2013 1532   BUN 9.1 09/17/2013 1427   BUN 8 08/13/2013 1532   CREATININE 0.7 09/17/2013 1427   CREATININE 0.66 08/13/2013 1532   CREATININE 0.74 03/01/2012 1715      Component Value Date/Time   CALCIUM 9.2 09/17/2013 1427   CALCIUM 9.4 08/13/2013 1532   ALKPHOS 138 09/17/2013 1427   ALKPHOS 145* 08/13/2013 1532   AST 33 09/17/2013 1427   AST 27 08/13/2013 1532   ALT 28 09/17/2013 1427   ALT 23 08/13/2013 1532   BILITOT 0.35 09/17/2013 1427   BILITOT 0.3 08/13/2013 1532       RADIOGRAPHIC STUDIES: Dg Abd 1 View  08/28/2013    CLINICAL DATA:  Pelvic pain, right lower quadrant pain  EXAM: ABDOMEN - 1 VIEW  COMPARISON:  08/28/2013  FINDINGS: Calcified phleboliths in the anatomic pelvis. Nonobstructive bowel gas pattern. No free air or organomegaly. No acute bony abnormality.  IMPRESSION: No acute findings.   Electronically Signed   By: Charlett Nose M.D.   On: 08/28/2013 14:41   US Abdomen Limited  08/28/2013   CLINICAL DATA:  History of breast carcinoma. Baseline evaluation of liver.  EXAM: US ABDOMEN LIMITED - RIGHT UPPER QUADRANT  COMPARISON:  CT 07/03/2013. PET 03/13/2013.  FINDINGS: Gallbladder  There is cholelithiasis. Numerous calculi are seen within the gallbladder. The largest calculus which  could be measured had greatest diameter of 8 mm. No gallbladder wall thickening or pericholecystic fluid was evident. There was no positive sonographic Murphy's sign according to the technologist.  Common bile duct  Diameter: Common bile duct measured 3.0 mm. No choledocholithiasis is evident.  Liver:  Liver appeared normal in size. Parenchyma appear normal echogenicity. No hepatic masses or focal lesions are seen. Portal vein appears patent with hepatopetal flow.  No ascites is evident.  IMPRESSION: Cholelithiasis. No ultrasonic evidence of acute cholecystitis. Normal appearance of bile ducts and liver.   Electronically Signed   By: Onalee Hua  Call M.D.   On: 08/28/2013 12:18    ASSESSMENT AND PLAN: This is a very pleasant 62 years old [place] female with history of right breast adenocarcinoma in 2002 and diagnosed with metastatic high-grade carcinoma involving the lung, mediastinal lymphadenopathy and right axillary lymphadenopathy in October of 2013. She was treated as recurrent breast adenocarcinoma with Abraxane but she has evidence for disease progression on multiple recent scans. I think had a high-grade carcinoma is of unknown etiology at this point and the differential diagnosis includes several malignancies including lung cancer  with positive EGFR mutation, or breast. Other primaries cannot be excluded but less likely.  I had a lengthy discussion with the patient today about her current condition and treatment options. I recommended for the patient to have repeat PET scan as well as MRI of the brain for evaluation of her disease at this point. The scan showed continuous evidence for disease progression, I would consider the patient for treatment with either Gilotrif Tarceva as a target lesion for EGFR mutation in exon 19. I discussed with the patient adverse effect of this treatment including but not limited to skin rash, diarrhea, interstitial lung disease, liver or renal dysfunction. The patient had several questions today about her current condition and I answered them completely to her satisfaction. I would see her back for followup visit in 2 weeks for reevaluation and discussion of her scan results and further recommendation regarding her condition The patient will continue her current treatment with Abraxane for now until I have the imaging studies.  The patient voices understanding of current disease status and treatment options and is in agreement with the current care plan.  All questions were answered. The patient knows to call the clinic with any problems, questions or concerns. We can certainly see the patient much sooner if necessary.  I spent 55 minutes counseling the patient face to face. The total time spent in the appointment was 70 minutes.

## 2013-10-01 ENCOUNTER — Encounter: Payer: Self-pay | Admitting: *Deleted

## 2013-10-01 ENCOUNTER — Other Ambulatory Visit (HOSPITAL_BASED_OUTPATIENT_CLINIC_OR_DEPARTMENT_OTHER): Payer: Medicaid Other | Admitting: Lab

## 2013-10-01 ENCOUNTER — Ambulatory Visit (HOSPITAL_BASED_OUTPATIENT_CLINIC_OR_DEPARTMENT_OTHER): Payer: Medicaid Other | Admitting: Physician Assistant

## 2013-10-01 ENCOUNTER — Encounter: Payer: Self-pay | Admitting: Physician Assistant

## 2013-10-01 ENCOUNTER — Ambulatory Visit (HOSPITAL_BASED_OUTPATIENT_CLINIC_OR_DEPARTMENT_OTHER): Payer: Medicaid Other

## 2013-10-01 ENCOUNTER — Telehealth: Payer: Self-pay | Admitting: Internal Medicine

## 2013-10-01 VITALS — BP 119/74 | HR 91 | Temp 98.9°F | Resp 18 | Ht 60.0 in | Wt 117.7 lb

## 2013-10-01 DIAGNOSIS — C50219 Malignant neoplasm of upper-inner quadrant of unspecified female breast: Secondary | ICD-10-CM

## 2013-10-01 DIAGNOSIS — C773 Secondary and unspecified malignant neoplasm of axilla and upper limb lymph nodes: Secondary | ICD-10-CM

## 2013-10-01 DIAGNOSIS — C7951 Secondary malignant neoplasm of bone: Secondary | ICD-10-CM

## 2013-10-01 DIAGNOSIS — J984 Other disorders of lung: Secondary | ICD-10-CM

## 2013-10-01 DIAGNOSIS — C801 Malignant (primary) neoplasm, unspecified: Secondary | ICD-10-CM | POA: Insufficient documentation

## 2013-10-01 DIAGNOSIS — Z171 Estrogen receptor negative status [ER-]: Secondary | ICD-10-CM

## 2013-10-01 DIAGNOSIS — C50919 Malignant neoplasm of unspecified site of unspecified female breast: Secondary | ICD-10-CM

## 2013-10-01 DIAGNOSIS — Z5111 Encounter for antineoplastic chemotherapy: Secondary | ICD-10-CM

## 2013-10-01 DIAGNOSIS — C3490 Malignant neoplasm of unspecified part of unspecified bronchus or lung: Secondary | ICD-10-CM

## 2013-10-01 DIAGNOSIS — F411 Generalized anxiety disorder: Secondary | ICD-10-CM

## 2013-10-01 DIAGNOSIS — D649 Anemia, unspecified: Secondary | ICD-10-CM

## 2013-10-01 LAB — CBC WITH DIFFERENTIAL/PLATELET
BASO%: 0.4 % (ref 0.0–2.0)
Eosinophils Absolute: 0 10*3/uL (ref 0.0–0.5)
HCT: 33.3 % — ABNORMAL LOW (ref 34.8–46.6)
HGB: 11 g/dL — ABNORMAL LOW (ref 11.6–15.9)
MCHC: 33 g/dL (ref 31.5–36.0)
MONO#: 0.7 10*3/uL (ref 0.1–0.9)
NEUT#: 2.2 10*3/uL (ref 1.5–6.5)
NEUT%: 44.8 % (ref 38.4–76.8)
Platelets: 236 10*3/uL (ref 145–400)
RBC: 4.06 10*6/uL (ref 3.70–5.45)
RDW: 15.2 % — ABNORMAL HIGH (ref 11.2–14.5)
WBC: 5 10*3/uL (ref 3.9–10.3)
lymph#: 2 10*3/uL (ref 0.9–3.3)

## 2013-10-01 LAB — COMPREHENSIVE METABOLIC PANEL (CC13)
ALT: 57 U/L — ABNORMAL HIGH (ref 0–55)
AST: 45 U/L — ABNORMAL HIGH (ref 5–34)
Anion Gap: 13 mEq/L — ABNORMAL HIGH (ref 3–11)
CO2: 16 mEq/L — ABNORMAL LOW (ref 22–29)
Calcium: 9.7 mg/dL (ref 8.4–10.4)
Chloride: 107 mEq/L (ref 98–109)
Creatinine: 0.7 mg/dL (ref 0.6–1.1)
Glucose: 115 mg/dl (ref 70–140)
Sodium: 137 mEq/L (ref 136–145)
Total Protein: 7.6 g/dL (ref 6.4–8.3)

## 2013-10-01 MED ORDER — DEXAMETHASONE SODIUM PHOSPHATE 10 MG/ML IJ SOLN
INTRAMUSCULAR | Status: AC
  Filled 2013-10-01: qty 1

## 2013-10-01 MED ORDER — HEPARIN SOD (PORK) LOCK FLUSH 100 UNIT/ML IV SOLN
500.0000 [IU] | Freq: Once | INTRAVENOUS | Status: AC | PRN
  Administered 2013-10-01: 500 [IU]
  Filled 2013-10-01: qty 5

## 2013-10-01 MED ORDER — PACLITAXEL PROTEIN-BOUND CHEMO INJECTION 100 MG
100.0000 mg/m2 | Freq: Once | INTRAVENOUS | Status: AC
  Administered 2013-10-01: 150 mg via INTRAVENOUS
  Filled 2013-10-01: qty 30

## 2013-10-01 MED ORDER — SODIUM CHLORIDE 0.9 % IV SOLN
Freq: Once | INTRAVENOUS | Status: AC
  Administered 2013-10-01: 16:00:00 via INTRAVENOUS

## 2013-10-01 MED ORDER — ONDANSETRON 8 MG/50ML IVPB (CHCC)
8.0000 mg | Freq: Once | INTRAVENOUS | Status: AC
  Administered 2013-10-01: 8 mg via INTRAVENOUS

## 2013-10-01 MED ORDER — ONDANSETRON 8 MG/NS 50 ML IVPB
INTRAVENOUS | Status: AC
  Filled 2013-10-01: qty 8

## 2013-10-01 MED ORDER — DEXAMETHASONE SODIUM PHOSPHATE 10 MG/ML IJ SOLN
10.0000 mg | Freq: Once | INTRAMUSCULAR | Status: AC
  Administered 2013-10-01: 10 mg via INTRAVENOUS

## 2013-10-01 MED ORDER — SODIUM CHLORIDE 0.9 % IJ SOLN
10.0000 mL | INTRAMUSCULAR | Status: DC | PRN
  Administered 2013-10-01: 10 mL
  Filled 2013-10-01: qty 10

## 2013-10-01 NOTE — Telephone Encounter (Signed)
PT CAME IN AND NEEDED TO R/S APPT TO AFTER PET...DONE

## 2013-10-01 NOTE — Progress Notes (Signed)
Chaplain made follow-up visit with patient in lobby. Pt was friendly and waiting to go back for treatment. Pt asked about the continuation of the [facility] group that she had been a part of. She stated that she would like to continue meeting with the same group of people and with leader, Alli. Chaplain checked with head chaplain Aurther Loft about this and returned to give pt the information. Chaplain will follow up as necessary.

## 2013-10-01 NOTE — Patient Instructions (Signed)
[facility] Discharge Instructions for Patients Receiving Chemotherapy  Today you received the following chemotherapy agents: abraxane  To help prevent nausea and vomiting after your treatment, we encourage you to take your nausea medication.  Take it as often as prescribed.     If you develop nausea and vomiting that is not controlled by your nausea medication, call the clinic. If it is after clinic hours your family physician or the after hours number for the clinic or go to the Emergency Department.   BELOW ARE SYMPTOMS THAT SHOULD BE REPORTED IMMEDIATELY:  *FEVER GREATER THAN 100.5 F  *CHILLS WITH OR WITHOUT FEVER  NAUSEA AND VOMITING THAT IS NOT CONTROLLED WITH YOUR NAUSEA MEDICATION  *UNUSUAL SHORTNESS OF BREATH  *UNUSUAL BRUISING OR BLEEDING  TENDERNESS IN MOUTH AND THROAT WITH OR WITHOUT PRESENCE OF ULCERS  *URINARY PROBLEMS  *BOWEL PROBLEMS  UNUSUAL RASH Items with * indicate a potential emergency and should be followed up as soon as possible.  Feel free to call the clinic you have any questions or concerns. The clinic phone number is [phone].   I have been informed and understand all the instructions given to me. I know to contact the clinic, my physician, or go to the Emergency Department if any problems should occur. I do not have any questions at this time, but understand that I may call the clinic during office hours   should I have any questions or need assistance in obtaining follow up care.    __________________________________________  _____________  __________ Signature of Patient or Authorized Representative            Date                   Time    __________________________________________ Nurse's Signature   

## 2013-10-01 NOTE — Progress Notes (Signed)
[facility]  [phone] Fax:([phone]  OFFICE PROGRESS NOTE   ID: Debbie Larsen   DOB: 62-Nov-1952  MR#: [id number]  JYN#:[id number]    PCP: Rodman Pickle, MD SU: [name], MD CARDIO: Nicholes Mango, MD RAD ONC: [name], MD   [facility] HEME/ONC:  Kandice Hams, MD NEURO:  Maeola Harman, MD  CHIEF COMPLAINT:  1) Metastatic Breast Cancer    2) high-grade carcinoma of unknown    etiology (? Lung versus breast)    HISTORY OF PRESENT ILLNESS: The patient's 2002-2004 records have been retrieved. The patient underwent a right breast core biopsy 03/29/2001 for an invasive ductal carcinoma associated with necrosis ([id number]).  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 The tumor was estrogen receptor 95% positive, progesterone receptor 96% positive, with an MIB-1 of 11%, and no HER-2 amplification by the HercepTest. She completed 6 cycles of CMF chemotherapy on 12/12/2001. There were minor delays, but no dose reductions. She received radiation to the right breast and regional lymph nodes to a total of 5040 cGy with a right breast boost of 12 [name], completed 02/12/2002  More recently, "Debbie Larsen" develop some right shoulder pain sometime in late 2012. She had followup mammograms of until 2011. There was no mammogram performed in 2012. She was seen by physical therapy in orthopedic surgery for evaluation of her right shoulder pain. She had a mammogram on 07/24/2012 which showed a suspicious mass behind eczema of the right breast. Physical exam the time did show multiple involved right axillary lymph nodes. 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 HER-2 nonamplified the ratio by [facility] being 1.43. Proliferative index was  50%.    MRI scan of both breasts performed on 08/31/2012 showed multiple involved lymph nodes in the right axilla, no obvious masses in either breast. Unfortunately staging PET scan 09/12/2012 showed in addition to her right axillary recurrence, bilateral mediastinal and left lung nodules consistent with stage IV disease.  Her subsequent history is as detailed below.   INTERVAL HISTORY: Ms. Sawaya returns today for followup of her metastatic breast cancer.  Since her last appointment in 62 our office, she was also seen by Dr. Shirline Frees on 09/19/2013 for a second opinion regarding her high-grade carcinoma involving the lung, mediastinal lymphadenopathy, and right axillary lymphadenopathy. There is some question of whether or not this is actually breast cancer metastasized to the lung, a primary lung cancer, or perhaps another primary. Debbie Larsen is actually scheduled to see Dr. Shirline Frees next week on December 9 after having a PET scan for further evaluation.  In the meanwhile, she has been receiving Abraxane every 2 weeks and is due for her next dose today. The Abraxane is being continued pending additional imaging studies. She is tolerating the Abraxane well. She has chronic numbness in the right upper extremity status post remote radiation therapy. She's had no additional numbness, and no signs of peripheral neuropathy associated with the Abraxane. She has an occasional dry cough but no shortness of breath. She does notice some flushing of her face following the steroids with treatment, but has had no actual rashes or skin changes otherwise. She continues to have some pain, specifically in the right lower quadrant of the abdomen and extending into the groin/hip joint. This pain is stable. She  also received zoledronic acid monthly, last given November 11 and due again next week on December 9.  REVIEW OF SYSTEMS: Debbie Larsen denies any known fevers and has had no chills or night sweats. She denies any signs of abnormal bleeding. Her  energy level is fair. Her appetite is fair. She denies any nausea or emesis and is having regular bowel movements. She denies any chest pain or palpitations. She's had no abnormal headaches, dizziness, or change in vision. She denies any peripheral swelling and has had no additional myalgias, arthralgias, or bony pain other than that noted above.  A detailed review of systems is otherwise stable and noncontributory.    PAST MEDICAL HISTORY: Past Medical History  Diagnosis Date  . 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 Hypothyroid   . Enlarged lymph nodes   . BREAST CANCER 04/2001    s/p radiation therapy, chemotherapy and lumpectomy  . 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  . S/P radiation therapy     Right breast/regional lymph nodes/ 5040 cGy / 28 fractions/ bost of 1200 cGy/ 6 Fractions  . Status post chemotherapy     6 cycles of CMF completed on 12/12/2001  . Right shoulder pain     started late 2012  . Dry cough     intermittent  . SOB (shortness of breath)   . Use of letrozole (Femara) 03/14/2013  . Metastasis to bone 03/13/13    PET scan results  . Metastasis to bone     breast primary  . Cholelithiasis   . Anemia   . Cancer   . Stroke     PAST SURGICAL HISTORY: Past Surgical History  Procedure Laterality Date  . Tonsillectomy    . Breast lumpectomy  04/2001    excision of L breast fibroadenoma by Dr.   . Breast lumpectomy  05/16/2001    Right axillary sentinel lymph node biopsy.  . Portacath placement  09/19/2012    Procedure: INSERTION PORT-A-CATH;  Surgeon: [name], MD;  Location: [facility] OR;  Service: General;  Laterality: N/A;  . Lipoma excision Right 05/15/2013    Procedure: RIGHT AXILLARY LYMPH NODE DISSECTION;  Surgeon: [name], MD;  Location: [facility] ORS;  Service: General;  Laterality: Right;    FAMILY HISTORY Family History  Problem Relation Age of Onset  .  Diabetes Mother   . Hypertension Father   . Hypertension Sister   . Hypothyroidism Brother   . Cancer Neg Hx   The patient's father died from heart disease at the age of 7. The patient's mother died in 04-Aug-2012 at the age of 3 also from heart disease. The patient has 2 sisters and 2 brothers. There is no history of breast or ovarian cancer in the family to her knowledge.   GYNECOLOGIC HISTORY: G1 P1, first live birth age 74. She underwent menopause at the time of her chemotherapy in 2002. She did not use hormone replacement.   SOCIAL HISTORY: The patient is divorced and lives by herself. Her son Bailynn Dyk lives in [place], where he works as an Publishing copy. The patient tells me her son lives with 3 roommates" there is no room for me there". (Accordingly going to [employer] for studies is not an option for her). The patient has no grandchildren. She attends the local [employer].    ADVANCED DIRECTIVES: The patient tells me (11/01/2012) her healthcare power of attorney  is her sister Cristine Daw. She was unable to give me her contact information.   HEALTH MAINTENANCE: History  Substance Use Topics  . Smoking status: Never Smoker   . Smokeless tobacco: Never Used  . Alcohol Use: No     Colonoscopy: None file  PAP: 08/17/2012  Bone density: The patient's last bone density scan on 07/24/2012 showed a T score of -1.7 (osteopenia).  Lipid panel: Not on file   No Known Allergies  Current Outpatient Prescriptions  Medication Sig Dispense Refill  . B Complex-C (B-COMPLEX WITH VITAMIN C) tablet Take 1 tablet by mouth daily.      Marland Kitchen levothyroxine (SYNTHROID, LEVOTHROID) 75 MCG tablet Take 75 mcg by mouth every evening.      . lidocaine-prilocaine (EMLA) cream Apply topically as needed. Apply to port 1-2 hours before procedure  30 g  0  . Multiple Vitamin (MULTIVITAMIN WITH MINERALS) TABS Take 1 tablet by mouth daily.      . calcium-vitamin D (OSCAL WITH D) 500-200 MG-UNIT  per tablet Take 1 tablet by mouth daily with breakfast.      . ondansetron (ZOFRAN) 4 MG tablet Take one tablet twice daily (8 AM and 6 PM) beginning the evening of chemotherapy and continuing for 2 days  30 tablet  4  . prochlorperazine (COMPAZINE) 5 MG tablet Take 1 tablet (5 mg total) by mouth every 6 (six) hours as needed for nausea (take before meals and at bedtime starting the evening of chemotherapy and contune for 2 days).  30 tablet  6   No current facility-administered medications for this visit.   Facility-Administered Medications Ordered in Other Visits  Medication Dose Route Frequency Provider Last Rate Last Dose  . heparin lock flush 100 unit/mL  500 Units Intravenous Once Lowella Dell, MD      . heparin lock flush 100 unit/mL  500 Units Intracatheter Once PRN Lowella Dell, MD      . PACLitaxel-protein bound (ABRAXANE) chemo infusion 150 mg  100 mg/m2 (Treatment Plan Actual) Intravenous Once Lowella Dell, MD 60 mL/hr at 10/01/13 1656 150 mg at 10/01/13 1656  . sodium chloride 0.9 % injection 10 mL  10 mL Intravenous PRN Lowella Dell, MD      . sodium chloride 0.9 % injection 10 mL  10 mL Intravenous PRN Lowella Dell, MD   10 mL at 07/09/13 1717  . sodium chloride 0.9 % injection 10 mL  10 mL Intracatheter PRN Lowella Dell, MD   10 mL at 09/03/13 1644  . sodium chloride 0.9 % injection 10 mL  10 mL Intracatheter PRN Lowella Dell, MD       Objective: Middle-aged [place] Asian woman who appears anxious but is in no acute distress Filed Vitals:   10/01/13 1443  BP: 119/74  Pulse: 91  Temp: 98.9 F (37.2 C)  Resp: 18  Body mass index is 22.99 kg/(m^2).  ECOG FS: 1 - Symptomatic but completely ambulatory  Filed Weights   10/01/13 1443  Weight: 117 lb 11.2 oz (53.388 kg)   Physical Exam: HEENT:  Sclerae anicteric.  Oropharynx clear and moist. No evidence of ulcerations or candidiasis. Neck is supple.  NODES:  No cervical, supraclavicular, or  axillary  lymphadenopathy palpated.  BREAST EXAM:  Deferred.  LUNGS:  Clear to auscultation bilaterally.  No wheezes or rhonchi HEART:  Regular rate and rhythm. No murmur Appreciated.  ABDOMEN:  Soft,  mildly distended, nontender to palpation. no organomegaly or  masses palpated.  Positive bowel sounds.  MSK:  No focal spinal tenderness to palpation. Good range of motion bilaterally in the upper extremities.  EXTREMITIES: Normal and atraumatic.  No peripheral edema.   SKIN:  Benign with no visible rashes, ecchymoses, or petechiae  NEURO:  Nonfocal. Well oriented.  Anxious  affect.   LAB RESULTS: Lab Results  Component Value Date   WBC 5.0 10/01/2013   NEUTROABS 2.2 10/01/2013   HGB 11.0* 10/01/2013   HCT 33.3* 10/01/2013   MCV 82.0 10/01/2013   PLT 236 10/01/2013      Chemistry      Component Value Date/Time   NA 137 10/01/2013 1426   NA 135 08/13/2013 1532   K 4.0 10/01/2013 1426   K 3.7 08/13/2013 1532   CL 99 08/13/2013 1532   CL 106 03/21/2013 1422   CO2 16* 10/01/2013 1426   CO2 22 08/13/2013 1532   BUN 11.1 10/01/2013 1426   BUN 8 08/13/2013 1532   CREATININE 0.7 10/01/2013 1426   CREATININE 0.66 08/13/2013 1532   CREATININE 0.74 03/01/2012 1715      Component Value Date/Time   CALCIUM 9.7 10/01/2013 1426   CALCIUM 9.4 08/13/2013 1532   ALKPHOS 137 10/01/2013 1426   ALKPHOS 145* 08/13/2013 1532   AST 45* 10/01/2013 1426   AST 27 08/13/2013 1532   ALT 57* 10/01/2013 1426   ALT 23 08/13/2013 1532   BILITOT 0.32 10/01/2013 1426   BILITOT 0.3 08/13/2013 1532       STUDIES: No results found.   ASSESSMENT: SKYLYNNE SCHLECHTER is a  62 y.o. [place], [place] woman originally from [place] with stage IV breast cancer:  (1) Status post right lumpectomy and sentinel lymph node sampling 05/16/2001 for a pT1c pN1, stage IIA invasive ductal carcinoma, grade 2, estrogen receptor 95% and progesterone receptor 96% positive, with an MIB-1 of 11% and no HER-2 amplification by  immunocytochemistry.  (2) Treated adjuvantly with cyclophosphamide, methotrexate and fluorouracil, likely for 8 cycles (data unavailable), completed February 2003.  (3) Completed radiation to the right breast and regional lymph nodes 02/12/2002.  (4) The patient apparently refused axillary lymph node dissection and antiestrogen therapy (data unavailable).  (5) Recurrent disease documented by right axillary lymph node biopsy 08/23/2012, showing a high-grade invasive ductal carcinoma which was estrogen and progesterone receptor negative, with an MIB-1 of 59%, and no HER-2 amplification; staging studies showed hyper metabolic mediastinal and left lung lesions, with no involvement of the liver or brain  (6) Abraxane every 2 weeks started 09/20/2012, with good tolerance and evidence of response, completing 12 doses 02/21/2013.  Resumed 07/16/2013.  (7)  Restaging studies at [facility] March 2014 and here April-May 2014 as follows:   (a) bone involvement: [facility] bone scan showed focal tracer activity in the left acetabulum, left 12th rib posteriorly, and right sixth rib posteriorly; on CT scan a 9 mm sclerotic sacral lesion was new as compared to November of 2013.  (b) [facility] CT of the chest, abdomen and pelvis showed slight decrease in size of some mediastinal lymph nodes, persistent unchanged nodularity in the right axilla, normal liver, and no new or enlarging pulmonary nodules.  (c) MRI of the cervical spine shows no obvious lesion to explain the patient's Right arm symptoms.  (d) PET scan shows multiple bone lesions (including C7), mild growth in index lymph nodes, no change in possible left lung nodules, and no liver involvement.  (7) Genetic testing obtained at [facility] 03/19/2013, results pending.  (  8) Zolendronate started 02/21/2013, continuing monthly.   (9) Nonspecific myositis RUE documented at [facility] by EMG March 2014; cervical MRI does not explain symptoms, which are likely due to a right axillary  radiculopathy.  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  (11) Hx elevated liver enzymes (resolved)  - abdominal x-ray and abdominal ultrasound on 08/28/2013 did not show metastatic disease and are relatively unremarkable.  (12) current question regarding etiology of high-grade carcinoma, with differential diagnosis including metastatic breast, primary lung, or possibly another primary.    PLAN:  This case was reviewed with Dr. Darnelle Catalan today who is aware of Sue's current treatment plan.  As recommended by Dr. Shirline Frees, we will continue with her Abraxane given every 2 weeks pending additional imaging studies. She will see Dr. Shirline Frees next week on December 9 to review those results and discuss further treatment options.  We will also continue to see her here in our office, and she is scheduled for a followup visit with Dr. Darnelle Catalan in 2 weeks, 10/15/2013. We will discuss at that time whether or not she will be continuing with Abraxane. Of note, in the meanwhile, she is continuing on zoledronic acid and will receive her next infusion when she returns next week on December 9.  We will continue to follow Debbie Larsen very closely. Of course we'll also follow her labs closely, including her liver enzymes which have a history of elevation. All this has been reviewed in detail with her today, and she voices understanding and agreement with the above plan. As always, she knows to contact us with any problems or questions prior to her next appointment.     Kema Santaella, PA-C-C 10/01/2013 5:07 PM

## 2013-10-02 ENCOUNTER — Other Ambulatory Visit: Payer: Medicaid Other | Admitting: Lab

## 2013-10-02 ENCOUNTER — Ambulatory Visit: Payer: Medicaid Other | Admitting: Internal Medicine

## 2013-10-02 ENCOUNTER — Ambulatory Visit (HOSPITAL_COMMUNITY): Payer: Medicaid Other

## 2013-10-03 ENCOUNTER — Ambulatory Visit: Payer: Medicaid Other

## 2013-10-07 ENCOUNTER — Telehealth: Payer: Self-pay | Admitting: Medical Oncology

## 2013-10-07 NOTE — Telephone Encounter (Signed)
Left message about keeping appointments . Pet scan approved by insurance after Dr Arbutus Ped  Did peer to peer.

## 2013-10-08 ENCOUNTER — Encounter (HOSPITAL_COMMUNITY)
Admission: RE | Admit: 2013-10-08 | Discharge: 2013-10-08 | Disposition: A | Discharge status: Home / Self Care | Payer: Medicaid Other | Source: Ambulatory Visit | Attending: Internal Medicine | Admitting: Internal Medicine

## 2013-10-08 ENCOUNTER — Encounter: Payer: Self-pay | Admitting: Internal Medicine

## 2013-10-08 ENCOUNTER — Encounter: Payer: Self-pay | Admitting: *Deleted

## 2013-10-08 ENCOUNTER — Other Ambulatory Visit: Payer: Self-pay | Admitting: Medical Oncology

## 2013-10-08 ENCOUNTER — Ambulatory Visit (HOSPITAL_BASED_OUTPATIENT_CLINIC_OR_DEPARTMENT_OTHER): Payer: Medicaid Other | Admitting: Internal Medicine

## 2013-10-08 ENCOUNTER — Ambulatory Visit (HOSPITAL_BASED_OUTPATIENT_CLINIC_OR_DEPARTMENT_OTHER): Payer: Medicaid Other

## 2013-10-08 ENCOUNTER — Telehealth: Payer: Self-pay | Admitting: Internal Medicine

## 2013-10-08 VITALS — BP 99/62 | HR 91 | Temp 97.2°F | Resp 18

## 2013-10-08 DIAGNOSIS — C801 Malignant (primary) neoplasm, unspecified: Secondary | ICD-10-CM

## 2013-10-08 DIAGNOSIS — M8580 Other specified disorders of bone density and structure, unspecified site: Secondary | ICD-10-CM

## 2013-10-08 DIAGNOSIS — C7951 Secondary malignant neoplasm of bone: Secondary | ICD-10-CM

## 2013-10-08 DIAGNOSIS — C50919 Malignant neoplasm of unspecified site of unspecified female breast: Secondary | ICD-10-CM | POA: Insufficient documentation

## 2013-10-08 DIAGNOSIS — K7689 Other specified diseases of liver: Secondary | ICD-10-CM | POA: Insufficient documentation

## 2013-10-08 DIAGNOSIS — C3490 Malignant neoplasm of unspecified part of unspecified bronchus or lung: Secondary | ICD-10-CM

## 2013-10-08 DIAGNOSIS — Z853 Personal history of malignant neoplasm of breast: Secondary | ICD-10-CM

## 2013-10-08 DIAGNOSIS — R109 Unspecified abdominal pain: Secondary | ICD-10-CM

## 2013-10-08 HISTORY — DX: Malignant neoplasm of unspecified part of unspecified bronchus or lung: C34.90

## 2013-10-08 LAB — GLUCOSE, CAPILLARY: Glucose-Capillary: 116 mg/dL — ABNORMAL HIGH (ref 70–99)

## 2013-10-08 MED ORDER — FLUDEOXYGLUCOSE F - 18 (FDG) INJECTION
17.1000 | Freq: Once | INTRAVENOUS | Status: AC | PRN
  Administered 2013-10-08: 17.1 via INTRAVENOUS

## 2013-10-08 MED ORDER — AFATINIB DIMALEATE 30 MG PO TABS: 30.0000 mg | ORAL_TABLET | Freq: Every day | ORAL | Status: DC

## 2013-10-08 MED ORDER — ZOLEDRONIC ACID 4 MG/100ML IV SOLN
4.0000 mg | Freq: Once | INTRAVENOUS | Status: AC
  Administered 2013-10-08: 4 mg via INTRAVENOUS
  Filled 2013-10-08: qty 100

## 2013-10-08 MED ORDER — HEPARIN SOD (PORK) LOCK FLUSH 100 UNIT/ML IV SOLN
500.0000 [IU] | Freq: Once | INTRAVENOUS | Status: AC
Start: 2013-10-08 — End: 2013-10-08
  Administered 2013-10-08: 500 [IU] via INTRAVENOUS
  Filled 2013-10-08: qty 5

## 2013-10-08 MED ORDER — SODIUM CHLORIDE 0.9 % IJ SOLN
10.0000 mL | INTRAMUSCULAR | Status: DC | PRN
  Administered 2013-10-08: 10 mL via INTRAVENOUS
  Filled 2013-10-08: qty 10

## 2013-10-08 NOTE — Progress Notes (Signed)
[facility] [phone]   [phone]  OFFICE PROGRESS NOTE  Rodman Pickle, MD [address] 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  2) history of right breast adenocarcinoma with positive ER/PR diagnosed in July of 2002.   MOLECULAR BIOMARKERS: The metastatic high-grade carcinoma diagnosed in October of 2013 was negative for ER, PR and HER-2. Next generation sequencing showed the tumor is positive for EGFR mutation [id number] in exon 19, SMARCB1, PIK3CA and HRAS>   PRIOR THERAPY:  1) status post right lumpectomy in July of 2002.  2) status post systemic chemotherapy with CMF for a total of 6 cycles completed 12/12/2001.  3) status post radiotherapy to the right breast and regional lymph nodes for a total dose of 5040 cGY with a boost of 1200 cGY completed in February 12, 2002.  4) the patient presented with right axillary lymphadenopathy in November of 2013 and excisional biopsy showed high grade carcinoma of unknown primary. PET scan at that time showed the right axillary lymphadenopathy in addition to bilateral mediastinum and left lung nodules.  5) She was treated as breast cancer with Abraxane until April of 2014. Repeat PET scan on 03/13/2013 showed evidence for disease progression.   CURRENT THERAPY: Abraxane 100 mg/M2 on days 1 and 8 every 3 weeks, status post 9 cycles.   INTERVAL HISTORY: Debbie Larsen 62 y.o. female returns to the clinic today for follow up visit. The patient was seen 2 weeks ago for evaluation of metastatic high-grade carcinoma that was suspicious for lung primary with positive EGFR mutation versus metastatic breast carcinoma. She has been on treatment with Abraxane for the last several weeks. I ordered a PET scan which was performed  earlier today and the patient is here for evaluation and discussion of her scan results and treatment options. The patient denied having any significant complaints today except for occasional abdominal pain. She denied having any significant chest pain, shortness of breath, cough or hemoptysis. She denied having any nausea or vomiting. The patient denied having any significant weight loss or night sweats.  MEDICAL HISTORY: Past Medical History  Diagnosis Date  . 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 Hypothyroid   . Enlarged lymph nodes   . BREAST CANCER 04/2001    s/p radiation therapy, chemotherapy and lumpectomy  . 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  . S/P radiation therapy     Right breast/regional lymph nodes/ 5040 cGy / 28 fractions/ bost of 1200 cGy/ 6 Fractions  . Status post chemotherapy     6 cycles of CMF completed on 12/12/2001  . Right shoulder pain     started late 2012  . Dry cough     intermittent  . SOB (shortness of breath)   . Use of letrozole (Femara) 03/14/2013  . Metastasis to bone 03/13/13    PET scan results  . Metastasis to bone     breast primary  . Cholelithiasis   . Anemia   . Cancer   . Stroke   . Lung cancer 10/08/2013    ALLERGIES:  has No Known Allergies.  MEDICATIONS:  Current Outpatient Prescriptions  Medication Sig Dispense Refill  . B Complex-C (B-COMPLEX WITH  VITAMIN C) tablet Take 1 tablet by mouth daily.      . calcium-vitamin D (OSCAL WITH D) 500-200 MG-UNIT per tablet Take 1 tablet by mouth daily with breakfast.      . levothyroxine (SYNTHROID, LEVOTHROID) 75 MCG tablet Take 75 mcg by mouth every evening.      . lidocaine-prilocaine (EMLA) cream Apply topically as needed. Apply to port 1-2 hours before procedure  30 g  0  . Multiple Vitamin (MULTIVITAMIN WITH MINERALS) TABS Take 1 tablet by mouth daily.      . ondansetron (ZOFRAN) 4 MG tablet Take one  tablet twice daily (8 AM and 6 PM) beginning the evening of chemotherapy and continuing for 2 days  30 tablet  4  . prochlorperazine (COMPAZINE) 5 MG tablet Take 1 tablet (5 mg total) by mouth every 6 (six) hours as needed for nausea (take before meals and at bedtime starting the evening of chemotherapy and contune for 2 days).  30 tablet  6   No current facility-administered medications for this visit.   Facility-Administered Medications Ordered in Other Visits  Medication Dose Route Frequency Provider Last Rate Last Dose  . heparin lock flush 100 unit/mL  500 Units Intravenous Once Lowella Dell, MD      . sodium chloride 0.9 % injection 10 mL  10 mL Intravenous PRN Lowella Dell, MD      . sodium chloride 0.9 % injection 10 mL  10 mL Intravenous PRN Lowella Dell, MD   10 mL at 07/09/13 1717  . sodium chloride 0.9 % injection 10 mL  10 mL Intracatheter PRN Lowella Dell, MD   10 mL at 09/03/13 1644    SURGICAL HISTORY:  Past Surgical History  Procedure Laterality Date  . Tonsillectomy    . Breast lumpectomy  04/2001    excision of L breast fibroadenoma by Dr.   . Breast lumpectomy  05/16/2001    Right axillary sentinel lymph node biopsy.  . Portacath placement  09/19/2012    Procedure: INSERTION PORT-A-CATH;  Surgeon: [name], MD;  Location: [facility] OR;  Service: General;  Laterality: N/A;  . Lipoma excision Right 05/15/2013    Procedure: RIGHT AXILLARY LYMPH NODE DISSECTION;  Surgeon: [name], MD;  Location: [facility] ORS;  Service: General;  Laterality: Right;    REVIEW OF SYSTEMS:  Constitutional: negative Eyes: negative Ears, nose, mouth, throat, and face: negative Respiratory: negative Cardiovascular: negative Gastrointestinal: positive for abdominal pain Genitourinary:negative Integument/breast: negative Hematologic/lymphatic: negative Musculoskeletal:negative Neurological: negative Behavioral/Psych: negative Endocrine:  negative Allergic/Immunologic: negative   PHYSICAL EXAMINATION: General appearance: alert, cooperative and no distress Head: Normocephalic, without obvious abnormality, atraumatic Neck: no adenopathy, no JVD, supple, symmetrical, trachea midline and thyroid not enlarged, symmetric, no tenderness/mass/nodules Lymph nodes: Cervical, supraclavicular, and axillary nodes normal. Resp: clear to auscultation bilaterally Back: symmetric, no curvature. ROM normal. No CVA tenderness. Cardio: regular rate and rhythm, S1, S2 normal, no murmur, click, rub or gallop GI: soft, non-tender; bowel sounds normal; no masses,  no organomegaly Extremities: extremities normal, atraumatic, no cyanosis or edema Neurologic: Alert and oriented X 3, normal strength and tone. Normal symmetric reflexes. Normal coordination and gait  ECOG PERFORMANCE STATUS: 1 - Symptomatic but completely ambulatory  There were no vitals taken for this visit.  LABORATORY DATA: Lab Results  Component Value Date   WBC 5.0 10/01/2013   HGB 11.0* 10/01/2013   HCT 33.3* 10/01/2013   MCV 82.0 10/01/2013   PLT 236 10/01/2013  Chemistry      Component Value Date/Time   NA 137 10/01/2013 1426   NA 135 08/13/2013 1532   K 4.0 10/01/2013 1426   K 3.7 08/13/2013 1532   CL 99 08/13/2013 1532   CL 106 03/21/2013 1422   CO2 16* 10/01/2013 1426   CO2 22 08/13/2013 1532   BUN 11.1 10/01/2013 1426   BUN 8 08/13/2013 1532   CREATININE 0.7 10/01/2013 1426   CREATININE 0.66 08/13/2013 1532   CREATININE 0.74 03/01/2012 1715      Component Value Date/Time   CALCIUM 9.7 10/01/2013 1426   CALCIUM 9.4 08/13/2013 1532   ALKPHOS 137 10/01/2013 1426   ALKPHOS 145* 08/13/2013 1532   AST 45* 10/01/2013 1426   AST 27 08/13/2013 1532   ALT 57* 10/01/2013 1426   ALT 23 08/13/2013 1532   BILITOT 0.32 10/01/2013 1426   BILITOT 0.3 08/13/2013 1532       RADIOGRAPHIC STUDIES: Nm Pet Image Restag (ps) Skull Base To Thigh  10/08/2013   CLINICAL DATA:   Subsequent treatment strategy for metastatic breast cancer.  EXAM: NUCLEAR MEDICINE PET SKULL BASE TO THIGH  FASTING BLOOD GLUCOSE:  Value: 116mg /dl  TECHNIQUE: 16.1 mCi W-96 FDG was injected intravenously. CT data was obtained and used for attenuation correction and anatomic localization only. (This was not acquired as a diagnostic CT examination.) Additional exam technical data entered on technologist worksheet.  COMPARISON:  PET CT 03/13/2013  FINDINGS: NECK  There is some misregistration between the CT and PET data due to motion. No hypermetabolic cervical lymph nodes are identified. There is some asymmetric activity within the right floor of the mouth which could be nodal or muscular.  CHEST  There is no significant residual hypermetabolic activity or soft tissue mass in the right axilla adjacent to the surgical clips. There is no recurrent abnormal chest wall activity. There is residual low-level mediastinal activity in the pretracheal and hilar regions without discrete corresponding lymphadenopathy. The pretracheal activity has an SUV max of 4.0. There is no progressive nodal activity. Mild nodularity at the left lung base and pleural thickening in the right hemithorax are unchanged. There is no significant pleural effusion.  ABDOMEN/PELVIS  There is a new hypermetabolic lesion centrally in the right hepatic lobe with an SUV max of 4.6. This likely corresponds with a 1.5 cm low-density lesion on image 111. There is no abnormal metabolic activity within the adrenal glands, pancreas or spleen. No hypermetabolic lymph nodes are seen within the abdomen or pelvis.  SKELETON  There is progressive widespread blastic metastatic disease throughout the axial and proximal appendicular skeleton. These lesions are hypermetabolic and involve the spine, pelvis, multiple ribs and both proximal femurs. No pathologic fractures are identified.  IMPRESSION: 1. Marked progression in widespread blastic osseous metastatic disease  without evidence of pathologic fracture. 2. New hypermetabolic central liver lesion consistent with metastatic disease. 3. The mediastinal and right axillary nodal activity previously demonstrated has improved with minimal residual central mediastinal activity.   Electronically Signed   By: Roxy Horseman M.D.   On: 10/08/2013 15:59    ASSESSMENT AND PLAN: this is a very pleasant 62 years old and in female with metastatic high-grade carcinoma of unknown primary questionable for metastatic lung cancer with positive EGFR mutation versus metastatic triple negative breast adenocarcinoma. Her recent PET scan showed significant evidence for disease progression with new liver lesion in addition to progressive metastatic bone disease but is improvement in the mediastinal lymphadenopathy. I had a lengthy  discussion with the patient about her recent scan results and showed the images. I recommended for the patient to discontinue her treatment with Abraxane because of the significant disease progression especially with a new liver lesion and the progressive bone disease. The patient has positive EGFR mutation in exon 19 and I recommended for her treatment with Gilotrif (Afatinib)  30 mg by mouth daily. I think starting the patient on Gilotrif 40 mg may cause her a lot of adverse effect especially with the small body habitus. I discussed with the patient adverse effect of this treatment including but not limited to a skin rash, diarrhea, liver or renal dysfunction. The patient signed the consent for the order medication. She would be tried on this treatment for the next 2 months followed by repeat CT scan of the chest, abdomen and pelvis for restaging of her disease. We will send her a prescription to specialty pharmacy for refill. I would see the patient back for follow up visit in 2 weeks for reevaluation and management any adverse effect of her treatment. The patient had several questions today and I answered them  completely to her satisfaction.  She was advised to call immediately if she has any concerning symptoms in the interval.  The patient voices understanding of current disease status and treatment options and is in agreement with the current care plan.  All questions were answered. The patient knows to call the clinic with any problems, questions or concerns. We can certainly see the patient much sooner if necessary.

## 2013-10-08 NOTE — Patient Instructions (Signed)

## 2013-10-08 NOTE — Telephone Encounter (Signed)
Gave pt appt for md on 12/11/ for MD only per Dr. Arbutus Ped

## 2013-10-08 NOTE — Progress Notes (Signed)
Chp made follow-up visit with pt in infusion area. Pt said that she was doing well but that she had had a long day here as the PET scan wasn't working. Pt stated that she is feeling okay. Pt confided in chaplain about her fears about [place] society: gun violence, homelessness, domestic abuse, etc. Pt spoke at length about how young people "should not be allowed to get pregnant," if they haven't registered or proved their readiness. Chaplain challenged pt on seeing such a bleak picture of the world and reflected the pt's own care and willingness to work for change as proof that not all people are bad. Pt stated that maybe "40%" of people in [place] are good, and that she knows there is some goodness. Chaplain reflected that pt seemed to talk mostly about negative things. Pt seemed to recognize this trend in herself. Chaplain continued to challenge and encourage pt as she spoke about her fears. Chaplain will follow up as necessary.

## 2013-10-08 NOTE — Patient Instructions (Signed)
Your PET scan showed evidence for disease progression. We discussed treatment options with Gilotrif 30 mg by mouth daily. Follow up visit in 2 weeks

## 2013-10-09 ENCOUNTER — Ambulatory Visit (HOSPITAL_COMMUNITY): Payer: Medicaid Other

## 2013-10-09 ENCOUNTER — Encounter: Payer: Self-pay | Admitting: Medical Oncology

## 2013-10-09 ENCOUNTER — Ambulatory Visit (HOSPITAL_COMMUNITY): Admission: RE | Admit: 2013-10-09 | Payer: Medicaid Other | Source: Ambulatory Visit

## 2013-10-09 ENCOUNTER — Ambulatory Visit: Payer: Medicaid Other | Admitting: Internal Medicine

## 2013-10-09 ENCOUNTER — Ambulatory Visit (INDEPENDENT_AMBULATORY_CARE_PROVIDER_SITE_OTHER): Payer: Medicaid Other | Admitting: Surgery

## 2013-10-09 ENCOUNTER — Telehealth (INDEPENDENT_AMBULATORY_CARE_PROVIDER_SITE_OTHER): Payer: Self-pay | Admitting: *Deleted

## 2013-10-09 NOTE — Telephone Encounter (Signed)
Patient called to state she can't come to her appt today due to a cold.  Patient wanted Dr. Jamey Ripa and Lesly Rubenstein to know she is feeling very good and feels she is doing well.  Explained that I would send them the message and cancel her appt for this afternoon.

## 2013-10-10 ENCOUNTER — Ambulatory Visit: Payer: Medicaid Other | Admitting: Internal Medicine

## 2013-10-11 ENCOUNTER — Telehealth: Payer: Self-pay | Admitting: Internal Medicine

## 2013-10-11 NOTE — Telephone Encounter (Signed)
s/w pt re appt for 12/23. pt aware 12/16 appt cx'd.

## 2013-10-15 ENCOUNTER — Ambulatory Visit: Payer: Medicaid Other | Admitting: Oncology

## 2013-10-15 ENCOUNTER — Ambulatory Visit: Payer: Medicaid Other

## 2013-10-15 ENCOUNTER — Other Ambulatory Visit: Payer: Medicaid Other

## 2013-10-22 ENCOUNTER — Encounter (INDEPENDENT_AMBULATORY_CARE_PROVIDER_SITE_OTHER): Payer: Self-pay

## 2013-10-22 ENCOUNTER — Telehealth: Payer: Self-pay | Admitting: *Deleted

## 2013-10-22 ENCOUNTER — Other Ambulatory Visit: Payer: Self-pay | Admitting: Medical Oncology

## 2013-10-22 ENCOUNTER — Encounter: Payer: Self-pay | Admitting: Physician Assistant

## 2013-10-22 ENCOUNTER — Telehealth: Payer: Self-pay | Admitting: Internal Medicine

## 2013-10-22 ENCOUNTER — Ambulatory Visit (HOSPITAL_BASED_OUTPATIENT_CLINIC_OR_DEPARTMENT_OTHER): Payer: Medicaid Other | Admitting: Physician Assistant

## 2013-10-22 ENCOUNTER — Other Ambulatory Visit: Payer: Medicaid Other

## 2013-10-22 DIAGNOSIS — C7951 Secondary malignant neoplasm of bone: Secondary | ICD-10-CM

## 2013-10-22 DIAGNOSIS — C801 Malignant (primary) neoplasm, unspecified: Secondary | ICD-10-CM

## 2013-10-22 DIAGNOSIS — C787 Secondary malignant neoplasm of liver and intrahepatic bile duct: Secondary | ICD-10-CM

## 2013-10-22 DIAGNOSIS — C50919 Malignant neoplasm of unspecified site of unspecified female breast: Secondary | ICD-10-CM

## 2013-10-22 NOTE — Progress Notes (Addendum)
[facility] [phone]   [phone]  SHARED VISIT PROGRESS NOTE  Rodman Pickle, MD 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  2) history of right breast adenocarcinoma with positive ER/PR diagnosed in July of 2002.   MOLECULAR BIOMARKERS: The metastatic high-grade carcinoma diagnosed in October of 2013 was negative for ER, PR and HER-2. Next generation sequencing showed the tumor is positive for EGFR mutation [id number] in exon 19, SMARCB1, PIK3CA and HRAS>   PRIOR THERAPY:  1) status post right lumpectomy in July of 2002.  2) status post systemic chemotherapy with CMF for a total of 6 cycles completed 12/12/2001.  3) status post radiotherapy to the right breast and regional lymph nodes for a total dose of 5040 cGY with a boost of 1200 cGY completed in February 12, 2002.  4) the patient presented with right axillary lymphadenopathy in November of 2013 and excisional biopsy showed high grade carcinoma of unknown primary. PET scan at that time showed the right axillary lymphadenopathy in addition to bilateral mediastinum and left lung nodules.  5) She was treated as breast cancer with Abraxane until April of 2014. Repeat PET scan on 03/13/2013 showed evidence for disease progression.  6) Abraxane 100 mg/M2 on days 1 and 8 every 3 weeks, status post 9 cycles.  CURRENT THERAPY: Gilotrif 30 mg po daily.    INTERVAL HISTORY: Debbie Larsen 62 y.o. female returns to the clinic today for follow up visit. The patient was by Dr. Arbutus Ped for evaluation of metastatic high-grade carcinoma that was suspicious for lung primary with positive EGFR mutation versus metastatic breast carcinoma. She was found to be EGFR positive at exon 19. She was started on Gilotrif  30 mg by mouth daily. She's not clear as to when she actually started this medication. She also is not clear as to how me doses that she took before she took upon herself to stop the medication. She states that she had diarrhea. His explained to her prior to initiation of this medication the diarrhea was a known side effect. She took one dose of Imodium and did not take any further issue is concerned about becoming constipated. She was having 4-5 episodes of diarrhea on the days that she was having diarrhea. She did not contact our office to let us know she was having difficulty or for advice regarding her diarrhea or her decision to stop the medication. She also took did not take the Imodium according to instructions or directions. She only took 1 tablet as she was concerned about becoming constipated. She presents today for follow up and refused to have laboratory tests drawn. She reports one episode of low-grade temperature with a MAXIMUM TEMPERATURE of 100.2 dose relieved with Tylenol. She had no subsequent temperature elevations. She complains of skin rash related to the Gilotrif treatment. The patient denied having any significant complaints today except for occasional abdominal pain. She denied having any significant chest pain, shortness of breath, cough or hemoptysis. She denied having any nausea or vomiting. The patient denied having any significant weight loss or night sweats.  She had several questions today regarding her treatment asking questions about possible treatment with Avastin, and radiation therapy systemic chemotherapy or combinations of all of the previous we mentioned as  well as her current treatment. Patient was told again that her disease is incurable. It is treatable but it is not curable. She had questions regarding her previous treatment under the care of Dr. Darnelle Catalan as well as complaints of continued peripheral neuropathy secondary to her previous treatment and previous radiation  therapy. This paresthesias the right upper extremity has been present for greater than 10 years.  MEDICAL HISTORY: Past Medical History  Diagnosis Date  . 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 Hypothyroid   . Enlarged lymph nodes   . BREAST CANCER 04/2001    s/p radiation therapy, chemotherapy and lumpectomy  . 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  . S/P radiation therapy     Right breast/regional lymph nodes/ 5040 cGy / 28 fractions/ bost of 1200 cGy/ 6 Fractions  . Status post chemotherapy     6 cycles of CMF completed on 12/12/2001  . Right shoulder pain     started late 2012  . Dry cough     intermittent  . SOB (shortness of breath)   . Use of letrozole (Femara) 03/14/2013  . Metastasis to bone 03/13/13    PET scan results  . Metastasis to bone     breast primary  . Cholelithiasis   . Anemia   . Cancer   . Stroke   . Lung cancer 10/08/2013    ALLERGIES:  has No Known Allergies.  MEDICATIONS:  Current Outpatient Prescriptions  Medication Sig Dispense Refill  . B Complex-C (B-COMPLEX WITH VITAMIN C) tablet Take 1 tablet by mouth daily.      . calcium-vitamin D (OSCAL WITH D) 500-200 MG-UNIT per tablet Take 1 tablet by mouth daily with breakfast.      . levothyroxine (SYNTHROID, LEVOTHROID) 75 MCG tablet Take 75 mcg by mouth every evening.      . lidocaine-prilocaine (EMLA) cream Apply topically as needed. Apply to port 1-2 hours before procedure  30 g  0  . Multiple Vitamin (MULTIVITAMIN WITH MINERALS) TABS Take 1 tablet by mouth daily.      Marland Kitchen afatinib dimaleate (GILOTRIF) 30 MG tablet Take 1 tablet (30 mg total) by mouth daily. Take on an empty stomach 1hr before or 2hrs after meals.  30 tablet  2  . ondansetron (ZOFRAN) 4 MG tablet Take one tablet twice daily (8 AM and 6 PM) beginning the evening of chemotherapy and continuing for 2 days  30 tablet  4  . prochlorperazine (COMPAZINE) 5 MG  tablet Take 1 tablet (5 mg total) by mouth every 6 (six) hours as needed for nausea (take before meals and at bedtime starting the evening of chemotherapy and contune for 2 days).  30 tablet  6   No current facility-administered medications for this visit.   Facility-Administered Medications Ordered in Other Visits  Medication Dose Route Frequency Provider Last Rate Last Dose  . heparin lock flush 100 unit/mL  500 Units Intravenous Once Lowella Dell, MD      . sodium chloride 0.9 % injection 10 mL  10 mL Intravenous PRN Lowella Dell, MD      . sodium chloride 0.9 % injection 10 mL  10 mL Intravenous PRN Lowella Dell, MD   10 mL at 07/09/13 1717  . sodium chloride 0.9 % injection 10 mL  10 mL Intracatheter PRN Lowella Dell, MD   10 mL at 09/03/13 1644  SURGICAL HISTORY:  Past Surgical History  Procedure Laterality Date  . Tonsillectomy    . Breast lumpectomy  04/2001    excision of L breast fibroadenoma by Dr.   . Breast lumpectomy  05/16/2001    Right axillary sentinel lymph node biopsy.  . Portacath placement  09/19/2012    Procedure: INSERTION PORT-A-CATH;  Surgeon: [name], MD;  Location: [facility] OR;  Service: General;  Laterality: N/A;  . Lipoma excision Right 05/15/2013    Procedure: RIGHT AXILLARY LYMPH NODE DISSECTION;  Surgeon: [name], MD;  Location: [facility] ORS;  Service: General;  Laterality: Right;    REVIEW OF SYSTEMS:  Constitutional: negative Eyes: negative Ears, nose, mouth, throat, and face: negative Respiratory: negative Cardiovascular: negative Gastrointestinal: positive for diarrhea Genitourinary:negative Integument/breast: positive for rash Hematologic/lymphatic: negative Musculoskeletal:negative Neurological: positive for paresthesia Behavioral/Psych: negative Endocrine: negative Allergic/Immunologic: negative   PHYSICAL EXAMINATION: General appearance: alert, cooperative and no distress Head: Normocephalic, without  obvious abnormality, atraumatic Neck: no adenopathy, no JVD, supple, symmetrical, trachea midline and thyroid not enlarged, symmetric, no tenderness/mass/nodules Lymph nodes: Cervical, supraclavicular, and axillary nodes normal. Resp: clear to auscultation bilaterally Back: symmetric, no curvature. ROM normal. No CVA tenderness. Cardio: regular rate and rhythm, S1, S2 normal, no murmur, click, rub or gallop GI: soft, non-tender; bowel sounds normal; no masses,  no organomegaly Extremities: extremities normal, atraumatic, no cyanosis or edema Neurologic: Alert and oriented X 3, normal strength and tone. Normal symmetric reflexes. Normal coordination and gait Skin: Mild erythema on the cheeks without any acneiform eruptions noted  ECOG PERFORMANCE STATUS: 1 - Symptomatic but completely ambulatory  There were no vitals taken for this visit.  LABORATORY DATA: Lab Results  Component Value Date   WBC 5.0 10/01/2013   HGB 11.0* 10/01/2013   HCT 33.3* 10/01/2013   MCV 82.0 10/01/2013   PLT 236 10/01/2013      Chemistry      Component Value Date/Time   NA 137 10/01/2013 1426   NA 135 08/13/2013 1532   K 4.0 10/01/2013 1426   K 3.7 08/13/2013 1532   CL 99 08/13/2013 1532   CL 106 03/21/2013 1422   CO2 16* 10/01/2013 1426   CO2 22 08/13/2013 1532   BUN 11.1 10/01/2013 1426   BUN 8 08/13/2013 1532   CREATININE 0.7 10/01/2013 1426   CREATININE 0.66 08/13/2013 1532   CREATININE 0.74 03/01/2012 1715      Component Value Date/Time   CALCIUM 9.7 10/01/2013 1426   CALCIUM 9.4 08/13/2013 1532   ALKPHOS 137 10/01/2013 1426   ALKPHOS 145* 08/13/2013 1532   AST 45* 10/01/2013 1426   AST 27 08/13/2013 1532   ALT 57* 10/01/2013 1426   ALT 23 08/13/2013 1532   BILITOT 0.32 10/01/2013 1426   BILITOT 0.3 08/13/2013 1532       RADIOGRAPHIC STUDIES: Nm Pet Image Restag (ps) Skull Base To Thigh  10/08/2013   CLINICAL DATA:  Subsequent treatment strategy for metastatic breast cancer.  EXAM: NUCLEAR  MEDICINE PET SKULL BASE TO THIGH  FASTING BLOOD GLUCOSE:  Value: 116mg /dl  TECHNIQUE: 14.7 mCi W-29 FDG was injected intravenously. CT data was obtained and used for attenuation correction and anatomic localization only. (This was not acquired as a diagnostic CT examination.) Additional exam technical data entered on technologist worksheet.  COMPARISON:  PET CT 03/13/2013  FINDINGS: NECK  There is some misregistration between the CT and PET data due to motion. No hypermetabolic cervical lymph nodes are identified. There is some  asymmetric activity within the right floor of the mouth which could be nodal or muscular.  CHEST  There is no significant residual hypermetabolic activity or soft tissue mass in the right axilla adjacent to the surgical clips. There is no recurrent abnormal chest wall activity. There is residual low-level mediastinal activity in the pretracheal and hilar regions without discrete corresponding lymphadenopathy. The pretracheal activity has an SUV max of 4.0. There is no progressive nodal activity. Mild nodularity at the left lung base and pleural thickening in the right hemithorax are unchanged. There is no significant pleural effusion.  ABDOMEN/PELVIS  There is a new hypermetabolic lesion centrally in the right hepatic lobe with an SUV max of 4.6. This likely corresponds with a 1.5 cm low-density lesion on image 111. There is no abnormal metabolic activity within the adrenal glands, pancreas or spleen. No hypermetabolic lymph nodes are seen within the abdomen or pelvis.  SKELETON  There is progressive widespread blastic metastatic disease throughout the axial and proximal appendicular skeleton. These lesions are hypermetabolic and involve the spine, pelvis, multiple ribs and both proximal femurs. No pathologic fractures are identified.  IMPRESSION: 1. Marked progression in widespread blastic osseous metastatic disease without evidence of pathologic fracture. 2. New hypermetabolic central liver  lesion consistent with metastatic disease. 3. The mediastinal and right axillary nodal activity previously demonstrated has improved with minimal residual central mediastinal activity.   Electronically Signed   By: Roxy Horseman M.D.   On: 10/08/2013 15:59    ASSESSMENT AND PLAN: this is a very pleasant 62 years old and in female with metastatic high-grade carcinoma of unknown primary questionable for metastatic lung cancer with positive EGFR mutation versus metastatic triple negative breast adenocarcinoma. Her recent PET scan showed significant evidence for disease progression with new liver lesion in addition to progressive metastatic bone disease but is improvement in the mediastinal lymphadenopathy.  The patient has positive EGFR mutation in exon 19 and is currently being treated with Gilotrif (Afatinib)  30 mg by mouth daily. She was not started on Gilotrif 40 mg as it may cause her a lot of adverse effect especially with the small body habitus. The patient was discussed with him also seen by Dr. Arbutus Ped. As outlined above we had an extensive conversation with the patient explaining her current disease state, that her disease was in curable and her current treatment with it being her best first option. She was advised that if she had questions regarding her previous treatment to speak with the providers involved. She was advised to restart her Gilotrif 30 mg by mouth daily and take Imodium as directed. Should her symptoms be too problematic she is to call our office for instructions as to how to proceed. She will return in approximately 2 weeks for another symptom management visit including laboratory studies to be drawn. Additionally the patient was offered to be referred to [facility] for a third opinion to see Dr. Sharyne Richters. Patient would like to think about this but is leaning towards being interested in referral. Of note the side effects of the medication as well as her disease state and treatment  decisions had been discussed with the patient at length at the previous 2 office visits.  The patient had several questions today and we answered them completely to her satisfaction.  She was advised to call immediately if she has any concerning symptoms in the interval.  The patient voices understanding of current disease status and treatment options and is in agreement with the  current care plan.  All questions were answered. The patient knows to call the clinic with any problems, questions or concerns. We can certainly see the patient much sooner if necessary.   Conni Slipper PA-C  ADDENDUM: Hematology/Oncology Attending: I had a face to face encounter with the patient. I recommended her care plan. This is a very lengthy visit with Debbie Larsen today. She is a very pleasant 62 years old [place] female with metastatic high-grade carcinoma of unknown primary questionable for metastatic lung cancer with positive EGFR mutation in exon 19 versus triple negative recurrent breast cancer. The patient was supposed to start treatment with Gilotrif 30 mg by mouth daily 2 weeks ago. She took her medication for complicated and stop the treatment because of concern about diarrhea. She did not call the office to report any adverse effect of her treatment and stop the drug on her own. The patient had a lot of questions today about her condition. She wants to add Avastin to her current treatment with Gilotrif even with the fact that she did not take her medication as prescribed. I explained to the patient that there is no data to support adding Avastin to her current treatment of Gilotrif at this point. I explained to the patient that she has to take her treatment as scheduled and wait for the upcoming scan to see if there is any need to change her treatment regimen especially if she is not responding to Gilotrif.  The patient also ask about enrollment in immunotherapy trial in addition to taking treatment with  Gilotrif.  I explained to the patient that the trial is currently closed at [facility] and again no data for combination of Gilotrif and Nivolumab.  She has several more questions that I answered them completely to her satisfaction. I also offered the patient and a second opinion at [facility], [facility] or [facility]. She would like to think about this option and she will let me know if she decided to go for a second opinion. She was strongly advised to be compliant with her treatment and to report any adverse effect to [place].  She would come back for follow up visit in 2 weeks for reevaluation and management any adverse effect of her treatment. The patient will continue her monthly treatment with Zometa as previously scheduled for the bone disease. I personally spent more than 45 minutes in discussion and consultation with the patient during this visit. She was advised to call immediately if she has any concerning symptoms. Lajuana Matte., MD 10/23/2013

## 2013-10-22 NOTE — Telephone Encounter (Signed)
Gave pt apt for lab and MD on january 2015 , Zometa ordered per nurse, emailed Marcelino Duster regarding zometa, lab will be drawn with infusion but no labs on MD visi

## 2013-10-22 NOTE — Telephone Encounter (Signed)
Per staff message and POF I have scheduled appts.  JMW  

## 2013-10-23 ENCOUNTER — Telehealth: Payer: Self-pay | Admitting: Internal Medicine

## 2013-10-23 NOTE — Patient Instructions (Signed)
Take Gilotrif 30 mg by mouth daily as prescribed Take Imodium as instructed for diarrhea Call our office with any concerning symptoms Follow up with Dr. Arbutus Ped on 11/13/13 for labs and another symptom management visit

## 2013-10-23 NOTE — Telephone Encounter (Signed)
Talked to pt gave her lab, infusion and and MD visit for January 2015

## 2013-11-05 ENCOUNTER — Other Ambulatory Visit (HOSPITAL_BASED_OUTPATIENT_CLINIC_OR_DEPARTMENT_OTHER): Payer: Medicaid Other

## 2013-11-05 ENCOUNTER — Ambulatory Visit (HOSPITAL_BASED_OUTPATIENT_CLINIC_OR_DEPARTMENT_OTHER): Payer: Medicaid Other

## 2013-11-05 ENCOUNTER — Encounter (INDEPENDENT_AMBULATORY_CARE_PROVIDER_SITE_OTHER): Payer: Self-pay

## 2013-11-05 VITALS — BP 109/69 | HR 97 | Temp 98.9°F | Resp 20

## 2013-11-05 DIAGNOSIS — C7952 Secondary malignant neoplasm of bone marrow: Secondary | ICD-10-CM

## 2013-11-05 DIAGNOSIS — M8580 Other specified disorders of bone density and structure, unspecified site: Secondary | ICD-10-CM

## 2013-11-05 DIAGNOSIS — C7951 Secondary malignant neoplasm of bone: Secondary | ICD-10-CM

## 2013-11-05 DIAGNOSIS — C801 Malignant (primary) neoplasm, unspecified: Secondary | ICD-10-CM

## 2013-11-05 DIAGNOSIS — C787 Secondary malignant neoplasm of liver and intrahepatic bile duct: Secondary | ICD-10-CM

## 2013-11-05 LAB — CBC WITH DIFFERENTIAL/PLATELET
BASO%: 0.1 % (ref 0.0–2.0)
BASOS ABS: 0 10*3/uL (ref 0.0–0.1)
EOS%: 0.9 % (ref 0.0–7.0)
Eosinophils Absolute: 0.1 10*3/uL (ref 0.0–0.5)
HEMATOCRIT: 30.8 % — AB (ref 34.8–46.6)
HEMOGLOBIN: 10.4 g/dL — AB (ref 11.6–15.9)
LYMPH#: 1.4 10*3/uL (ref 0.9–3.3)
LYMPH%: 24 % (ref 14.0–49.7)
MCH: 27.7 pg (ref 25.1–34.0)
MCHC: 33.6 g/dL (ref 31.5–36.0)
MCV: 82.3 fL (ref 79.5–101.0)
MONO#: 0.5 10*3/uL (ref 0.1–0.9)
MONO%: 8 % (ref 0.0–14.0)
NEUT#: 4 10*3/uL (ref 1.5–6.5)
NEUT%: 67 % (ref 38.4–76.8)
PLATELETS: 222 10*3/uL (ref 145–400)
RBC: 3.74 10*6/uL (ref 3.70–5.45)
RDW: 16.3 % — ABNORMAL HIGH (ref 11.2–14.5)
WBC: 6 10*3/uL (ref 3.9–10.3)

## 2013-11-05 LAB — COMPREHENSIVE METABOLIC PANEL (CC13)
ALT: 37 U/L (ref 0–55)
AST: 38 U/L — AB (ref 5–34)
Albumin: 4 g/dL (ref 3.5–5.0)
Alkaline Phosphatase: 141 U/L (ref 40–150)
Anion Gap: 8 mEq/L (ref 3–11)
BILIRUBIN TOTAL: 0.29 mg/dL (ref 0.20–1.20)
BUN: 9.4 mg/dL (ref 7.0–26.0)
CALCIUM: 9.2 mg/dL (ref 8.4–10.4)
CHLORIDE: 108 meq/L (ref 98–109)
CO2: 21 mEq/L — ABNORMAL LOW (ref 22–29)
CREATININE: 0.8 mg/dL (ref 0.6–1.1)
Glucose: 108 mg/dl (ref 70–140)
Potassium: 4.1 mEq/L (ref 3.5–5.1)
Sodium: 137 mEq/L (ref 136–145)
Total Protein: 7.4 g/dL (ref 6.4–8.3)

## 2013-11-05 MED ORDER — SODIUM CHLORIDE 0.9 % IJ SOLN
10.0000 mL | INTRAMUSCULAR | Status: DC | PRN
  Administered 2013-11-05: 10 mL via INTRAVENOUS
  Filled 2013-11-05: qty 10

## 2013-11-05 MED ORDER — HEPARIN SOD (PORK) LOCK FLUSH 100 UNIT/ML IV SOLN
500.0000 [IU] | Freq: Once | INTRAVENOUS | Status: AC
  Administered 2013-11-05: 500 [IU] via INTRAVENOUS
  Filled 2013-11-05: qty 5

## 2013-11-05 MED ORDER — SODIUM CHLORIDE 0.9 % IV SOLN
Freq: Once | INTRAVENOUS | Status: AC
  Administered 2013-11-05: 15:00:00 via INTRAVENOUS

## 2013-11-05 MED ORDER — ZOLEDRONIC ACID 4 MG/100ML IV SOLN
4.0000 mg | Freq: Once | INTRAVENOUS | Status: AC
  Administered 2013-11-05: 4 mg via INTRAVENOUS
  Filled 2013-11-05: qty 100

## 2013-11-05 NOTE — Patient Instructions (Signed)

## 2013-11-12 ENCOUNTER — Telehealth: Payer: Self-pay | Admitting: Internal Medicine

## 2013-11-12 NOTE — Telephone Encounter (Signed)
R/s appt to pm per pt rqst

## 2013-11-13 ENCOUNTER — Ambulatory Visit (HOSPITAL_COMMUNITY): Admission: RE | Admit: 2013-11-13 | Payer: Medicaid Other | Source: Ambulatory Visit

## 2013-11-13 ENCOUNTER — Other Ambulatory Visit: Payer: Medicaid Other

## 2013-11-13 ENCOUNTER — Telehealth: Payer: Self-pay | Admitting: Internal Medicine

## 2013-11-13 ENCOUNTER — Ambulatory Visit: Payer: Medicaid Other | Admitting: Internal Medicine

## 2013-11-13 ENCOUNTER — Encounter: Payer: Self-pay | Admitting: Internal Medicine

## 2013-11-13 ENCOUNTER — Ambulatory Visit (HOSPITAL_BASED_OUTPATIENT_CLINIC_OR_DEPARTMENT_OTHER): Payer: Medicaid Other | Admitting: Internal Medicine

## 2013-11-13 VITALS — BP 106/61 | HR 88 | Temp 98.3°F | Resp 18 | Ht 60.0 in | Wt 114.3 lb

## 2013-11-13 DIAGNOSIS — C787 Secondary malignant neoplasm of liver and intrahepatic bile duct: Secondary | ICD-10-CM

## 2013-11-13 DIAGNOSIS — C801 Malignant (primary) neoplasm, unspecified: Secondary | ICD-10-CM

## 2013-11-13 DIAGNOSIS — C7951 Secondary malignant neoplasm of bone: Secondary | ICD-10-CM

## 2013-11-13 DIAGNOSIS — C7952 Secondary malignant neoplasm of bone marrow: Secondary | ICD-10-CM

## 2013-11-13 DIAGNOSIS — C3490 Malignant neoplasm of unspecified part of unspecified bronchus or lung: Secondary | ICD-10-CM

## 2013-11-13 DIAGNOSIS — D649 Anemia, unspecified: Secondary | ICD-10-CM

## 2013-11-13 NOTE — Patient Instructions (Signed)
Followup visit in one month with repeat CT scan of the head, chest, abdomen and pelvis.

## 2013-11-13 NOTE — Progress Notes (Signed)
[facility] Telephone:([phone]   Fax:([phone]  OFFICE PROGRESS NOTE  Melrose Nakayama, MD 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  2) history of right breast adenocarcinoma with positive ER/PR diagnosed in July of 2002.   MOLECULAR BIOMARKERS: The metastatic high-grade carcinoma diagnosed in October of 2013 was negative for ER, PR and HER-2. Next generation sequencing showed the tumor is positive for EGFR mutation [id number] in exon 19, SMARCB1, PIK3CA and HRAS>   PRIOR THERAPY:  1) status post right lumpectomy in July of 2002.  2) status post systemic chemotherapy with CMF for a total of 6 cycles completed 12/12/2001.  3) status post radiotherapy to the right breast and regional lymph nodes for a total dose of 5040 cGY with a boost of 1200 cGY completed in February 12, 2002.  4) the patient presented with right axillary lymphadenopathy in November of 2013 and excisional biopsy showed high grade carcinoma of unknown primary. PET scan at that time showed the right axillary lymphadenopathy in addition to bilateral mediastinum and left lung nodules.  5) She was treated as breast cancer with Abraxane until April of 2014. Repeat PET scan on 03/13/2013 showed evidence for disease progression.  6) Abraxane 100 mg/M2 on days 1 and 8 every 3 weeks, status post 9 cycles.   CURRENT THERAPY:  Gilotrif 30 mg by mouth daily, started 10/23/2013.   INTERVAL HISTORY: Debbie Larsen 63 y.o. female returns to the clinic today for follow up visit. The patient is tolerating her current treatment with Gilotrif fairly well with no significant adverse effects except for mild skin rash on the face and 2-3 episodes of diarrhea every day resolved with Imodium. She denied having any  nausea or vomiting, no fever or chills. She is able to maintain her weight stable. She denied having any significant chest pain, shortness breath, cough or hemoptysis. Her CBC and comprehensive metabolic panel last week showed no significant abnormality except for the mild persistent anemia.   MEDICAL HISTORY: Past Medical History  Diagnosis Date  . 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 Hypothyroid   . Enlarged lymph nodes   . BREAST CANCER 04/2001    s/p radiation therapy, chemotherapy and lumpectomy  . 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  . S/P radiation therapy     Right breast/regional lymph nodes/ 5040 cGy / 28 fractions/ bost of 1200 cGy/ 6 Fractions  . Status post chemotherapy     6 cycles of CMF completed on 12/12/2001  . Right shoulder pain     started late 2012  . Dry cough     intermittent  . SOB (shortness of breath)   . Use of letrozole (Femara) 03/14/2013  . Metastasis to bone 03/13/13    PET scan results  . Metastasis to bone     breast primary  . Cholelithiasis   . Anemia   . Cancer   . Stroke   . Lung cancer 10/08/2013    ALLERGIES:  has No Known Allergies.  MEDICATIONS:  Current Outpatient Prescriptions  Medication Sig Dispense Refill  . afatinib dimaleate (GILOTRIF) 30 MG tablet Take 1 tablet (30 mg total) by mouth daily. Take on an  empty stomach 1hr before or 2hrs after meals.  30 tablet  2  . B Complex-C (B-COMPLEX WITH VITAMIN C) tablet Take 1 tablet by mouth daily.      . calcium-vitamin D (OSCAL WITH D) 500-200 MG-UNIT per tablet Take 1 tablet by mouth daily with breakfast.      . levothyroxine (SYNTHROID, LEVOTHROID) 75 MCG tablet Take 75 mcg by mouth every evening.      . lidocaine-prilocaine (EMLA) cream Apply topically as needed. Apply to port 1-2 hours before procedure  30 g  0  . Multiple Vitamin (MULTIVITAMIN WITH MINERALS) TABS Take 1 tablet by mouth daily.      .  ondansetron (ZOFRAN) 4 MG tablet Take one tablet twice daily (8 AM and 6 PM) beginning the evening of chemotherapy and continuing for 2 days  30 tablet  4  . prochlorperazine (COMPAZINE) 5 MG tablet Take 1 tablet (5 mg total) by mouth every 6 (six) hours as needed for nausea (take before meals and at bedtime starting the evening of chemotherapy and contune for 2 days).  30 tablet  6   No current facility-administered medications for this visit.   Facility-Administered Medications Ordered in Other Visits  Medication Dose Route Frequency Provider Last Rate Last Dose  . heparin lock flush 100 unit/mL  500 Units Intravenous Once Chauncey Cruel, MD      . sodium chloride 0.9 % injection 10 mL  10 mL Intravenous PRN Chauncey Cruel, MD      . sodium chloride 0.9 % injection 10 mL  10 mL Intravenous PRN Chauncey Cruel, MD   10 mL at 07/09/13 1717  . sodium chloride 0.9 % injection 10 mL  10 mL Intracatheter PRN Chauncey Cruel, MD   10 mL at 09/03/13 1644    SURGICAL HISTORY:  Past Surgical History  Procedure Laterality Date  . Tonsillectomy    . Breast lumpectomy  04/2001    excision of L breast fibroadenoma by Dr.   . Breast lumpectomy  05/16/2001    Right axillary sentinel lymph node biopsy.  . Portacath placement  09/19/2012    Procedure: INSERTION PORT-A-CATH;  Surgeon: Haywood Lasso, MD;  Location: [facility];  Service: General;  Laterality: N/A;  . Lipoma excision Right 05/15/2013    Procedure: RIGHT AXILLARY LYMPH NODE DISSECTION;  Surgeon: Haywood Lasso, MD;  Location: [facility];  Service: General;  Laterality: Right;    REVIEW OF SYSTEMS:  Constitutional: negative Eyes: negative Ears, nose, mouth, throat, and face: negative Respiratory: negative Cardiovascular: negative Gastrointestinal: positive for diarrhea Genitourinary:negative Integument/breast: negative Hematologic/lymphatic: negative Musculoskeletal:negative Neurological: negative Behavioral/Psych:  negative Endocrine: negative Allergic/Immunologic: negative   PHYSICAL EXAMINATION: General appearance: alert, cooperative and no distress Head: Normocephalic, without obvious abnormality, atraumatic Neck: no adenopathy, no JVD, supple, symmetrical, trachea midline and thyroid not enlarged, symmetric, no tenderness/mass/nodules Lymph nodes: Cervical, supraclavicular, and axillary nodes normal. Resp: clear to auscultation bilaterally Back: symmetric, no curvature. ROM normal. No CVA tenderness. Cardio: regular rate and rhythm, S1, S2 normal, no murmur, click, rub or gallop GI: soft, non-tender; bowel sounds normal; no masses,  no organomegaly Extremities: extremities normal, atraumatic, no cyanosis or edema Neurologic: Alert and oriented X 3, normal strength and tone. Normal symmetric reflexes. Normal coordination and gait  ECOG PERFORMANCE STATUS: 1 - Symptomatic but completely ambulatory  There were no vitals taken for this visit.  LABORATORY DATA: Lab Results  Component Value Date   WBC 6.0 11/05/2013   HGB  10.4* 11/05/2013   HCT 30.8* 11/05/2013   MCV 82.3 11/05/2013   PLT 222 11/05/2013      Chemistry      Component Value Date/Time   NA 137 11/05/2013 1500   NA 135 08/13/2013 1532   K 4.1 11/05/2013 1500   K 3.7 08/13/2013 1532   CL 99 08/13/2013 1532   CL 106 03/21/2013 1422   CO2 21* 11/05/2013 1500   CO2 22 08/13/2013 1532   BUN 9.4 11/05/2013 1500   BUN 8 08/13/2013 1532   CREATININE 0.8 11/05/2013 1500   CREATININE 0.66 08/13/2013 1532   CREATININE 0.74 03/01/2012 1715      Component Value Date/Time   CALCIUM 9.2 11/05/2013 1500   CALCIUM 9.4 08/13/2013 1532   ALKPHOS 141 11/05/2013 1500   ALKPHOS 145* 08/13/2013 1532   AST 38* 11/05/2013 1500   AST 27 08/13/2013 1532   ALT 37 11/05/2013 1500   ALT 23 08/13/2013 1532   BILITOT 0.29 11/05/2013 1500   BILITOT 0.3 08/13/2013 1532       RADIOGRAPHIC STUDIES:  ASSESSMENT AND PLAN: this is a very pleasant 63 years old [place] female  with metastatic high-grade carcinoma of unknown primary questionable for metastatic lung cancer with positive EGFR mutation versus metastatic triple negative breast adenocarcinoma. Her recent PET scan showed significant evidence for disease progression with new liver lesion in addition to progressive metastatic bone disease but is improvement in the mediastinal lymphadenopathy. The patient has positive EGFR mutation in exon 19 and she is currently on treatment with Gilotrif (Afatinib) 30 mg by mouth daily.  The patient is tolerating her treatment fairly well except for mild diarrhea resolved with Imodium. I recommended for her to continue her current treatment with Gilotrif. I would see her back for followup visit in one month with repeat blood work as well as repeat CT scan of the head, chest, abdomen and pelvis for restaging of her disease. The patient had several questions today and I answered them completely to her satisfaction.  She was advised to call immediately if she has any concerning symptoms in the interval. The patient voices understanding of current disease status and treatment options and is in agreement with the current care plan.  All questions were answered. The patient knows to call the clinic with any problems, questions or concerns. We can certainly see the patient much sooner if necessary. I spent 20 minutes of face-to-face counseling with the patient today out of the total time of the visit 30 minutes.  Disclaimer: This note was dictated with voice recognition software. Similar sounding words can inadvertently be transcribed and may not be corrected upon review.

## 2013-11-13 NOTE — Telephone Encounter (Signed)
gv pt appt schedule for february. [facility] will call pt w/ct appt - pt aware. no prep given per pt she will drink the water based prep and will inform central when they call

## 2013-11-20 ENCOUNTER — Ambulatory Visit (INDEPENDENT_AMBULATORY_CARE_PROVIDER_SITE_OTHER): Payer: Medicaid Other | Admitting: Family Medicine

## 2013-11-20 ENCOUNTER — Encounter: Payer: Self-pay | Admitting: Family Medicine

## 2013-11-20 VITALS — BP 118/72 | HR 88 | Temp 98.4°F | Wt 114.0 lb

## 2013-11-20 DIAGNOSIS — M25519 Pain in unspecified shoulder: Secondary | ICD-10-CM

## 2013-11-20 DIAGNOSIS — E039 Hypothyroidism, unspecified: Secondary | ICD-10-CM

## 2013-11-20 DIAGNOSIS — E559 Vitamin D deficiency, unspecified: Secondary | ICD-10-CM

## 2013-11-20 DIAGNOSIS — M25511 Pain in right shoulder: Secondary | ICD-10-CM

## 2013-11-20 NOTE — Patient Instructions (Signed)
We will check your labs today. I will send you a letter with the results. You will need a bone scan this fall (every 2 years.) I have put in a referral for neurology. We will schedule this for you. Please come back in a few months for a check up, or sooner if you need anything!  Ayda Tancredi M. Gleen Ripberger, M.D.   Iron-Rich Diet An iron-rich diet contains foods that are good sources of iron. Iron is an important mineral that helps your body produce hemoglobin. Hemoglobin is a protein in red blood cells that carries oxygen to the body's tissues. Sometimes, the iron level in your blood can be low. This may be caused by:  A lack of iron in your diet.  Blood loss.  Times of growth, such as during pregnancy or during a child's growth and development. Low levels of iron can cause a decrease in the number of red blood cells. This can result in iron deficiency anemia. Iron deficiency anemia symptoms include:  Tiredness.  Weakness.  Irritability.  Increased chance of infection. Here are some recommendations for daily iron intake:  Males older than 63 years of age need 8 mg of iron per day.  Women ages 33 to 44 need 18 mg of iron per day.  Pregnant women need 27 mg of iron per day, and women who are over 67 years of age and breastfeeding need 9 mg of iron per day.  Women over the age of 69 need 8 mg of iron per day. SOURCES OF IRON There are 2 types of iron that are found in food: heme iron and nonheme iron. Heme iron is absorbed by the body better than nonheme iron. Heme iron is found in meat, poultry, and fish. Nonheme iron is found in grains, beans, and vegetables. Nonheme Iron Sources Food / Iron (mg)  Ready-to-eat breakfast cereal, iron-fortified / 3.9 to 7 mg  Tofu,  cup / 3.4 mg  Kidney beans,  cup / 2.6 mg  Baked potato with skin / 2.7 mg  Asparagus,  cup / 2.2 mg  Avocado / 2 mg  Dried peaches,  cup / 1.6 mg  Raisins,  cup / 1.5 mg  Soy milk, 1 cup / 1.5  mg  Whole-wheat bread, 1 slice / 1.2 mg  Spinach, 1 cup / 0.8 mg  Broccoli,  cup / 0.6 mg IRON ABSORPTION Certain foods can decrease the body's absorption of iron. Try to avoid these foods and beverages while eating meals with iron-containing foods:  Coffee.  Tea.  Fiber.  Soy. Foods containing vitamin C can help increase the amount of iron your body absorbs from iron sources, especially from nonheme sources. Eat foods with vitamin C along with iron-containing foods to increase your iron absorption. Foods that are high in vitamin C include many fruits and vegetables. Some good sources are:  Fresh orange juice.  Oranges.  Strawberries.  Mangoes.  Grapefruit.  Red bell peppers.  Green bell peppers.  Broccoli.  Potatoes with skin.  Tomato juice. Document Released: 05/31/2005 Document Revised: 01/09/2012 Document Reviewed: 04/07/2011 [facility] Patient Information 2014 [place], [place].

## 2013-11-21 ENCOUNTER — Telehealth: Payer: Self-pay | Admitting: Family Medicine

## 2013-11-21 LAB — TSH: TSH: 5.813 u[IU]/mL — ABNORMAL HIGH (ref 0.350–4.500)

## 2013-11-21 LAB — LIPID PANEL
CHOL/HDL RATIO: 3.4 ratio
CHOLESTEROL: 161 mg/dL (ref 0–200)
HDL: 47 mg/dL (ref >39)
LDL Cholesterol: 90 mg/dL (ref 0–99)
Triglycerides: 121 mg/dL (ref <150)
VLDL: 24 mg/dL (ref 0–40)

## 2013-11-21 LAB — VITAMIN D 25 HYDROXY (VIT D DEFICIENCY, FRACTURES): Vit D, 25-Hydroxy: 33 ng/mL (ref 30–89)

## 2013-11-21 NOTE — Assessment & Plan Note (Signed)
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 Will refer to neurology for further evaluation.

## 2013-11-21 NOTE — Telephone Encounter (Signed)
Abnormal labs, received results today, waiting for MD to review.  Pt should be made aware that MD are not in clinic everyday and we ask for 24-48 hour callback time. Shaterrica Territo, Salome Spotted

## 2013-11-21 NOTE — Progress Notes (Signed)
Patient ID: JAZMON KOS, female   DOB: [birth year], 63 y.o.   MRN: [id number]    Subjective: HPI: Patient is a 63 y.o. female presenting to clinic today for follow up. Concerns today include wanting her vitamin levels and thyroid check  1. Hypothyroidism- Stable on current dose and would like to stay on dose if her numbers are reasonable. Due to her cancer treatment she is not able to identify any specific treatments related to her thyroid. No missed doses of medication. Overall feels ok.  2. VItamin levels- States she has had low Vit D in the past and has low iron. She is undergoing chemo which can cause some of her anemia, but she does have recorded Vit D deficiency. She is a vegetarian and does not get any heme sources of iron.   3. Neuropathy- Pt has shoulder pain in her right shoulder which she was told is likely secondary to neuropathy from her prior radiation. She was told she needs a referral from her PCP to see a Neurologist. She is "dealing with her pain" and only taking Tylenol as needed right now but would like a neurology evaluation.   History Reviewed: Non smoker.  ROS: Please see HPI above.  Objective: Office vital signs reviewed. BP 118/72  Pulse 88  Temp(Src) 98.4 F (36.9 C) (Oral)  Wt 114 lb (51.71 kg)  Physical Examination:  General: Awake, alert. NAD. Talkative and happy HEENT: Atraumatic, normocephalic. MMM. Neck: No masses palpated. No LAD. Thyroid palpable but not enlarged and no nodules appreciated. Pulm: CTAB, no wheezes Cardio: RRR, no murmurs appreciated Abdomen:+BS, soft, nontender, nondistended Neuro: Grossly intact  Assessment: 63 y.o. female follow up  Plan: See Problem List and After Visit Summary

## 2013-11-21 NOTE — Telephone Encounter (Signed)
Pt called again. She doesn't understand why she" wasn't called during their lunchtime" If you dont call today then you can call tomorrow

## 2013-11-21 NOTE — Telephone Encounter (Signed)
Wants results of yesterdays blood results Would like Janett Billow to call her back

## 2013-11-21 NOTE — Assessment & Plan Note (Signed)
Will check TSH but would like to stay on current dose if possible. Will follow up with patient when results return.

## 2013-11-21 NOTE — Assessment & Plan Note (Signed)
Con't multivitamin. Add iron to regimen if possible. Will check Vit D levels today.

## 2013-11-22 MED ORDER — LEVOTHYROXINE SODIUM 75 MCG PO TABS: 75.0000 ug | ORAL_TABLET | Freq: Every evening | ORAL | Status: DC

## 2013-11-22 NOTE — Telephone Encounter (Signed)
Pt informed.  1. Mailed lab results (pt request)  2. Refilled thyroid medication  Apryl Brymer, Salome Spotted

## 2013-11-22 NOTE — Telephone Encounter (Signed)
I have reviewed her labs. Her cholesterol and vitamin D levels were normal. They should be rechecked in one year. Her TSH was 5, which is only slightly above the normal range of 4.5. Based on our discussion during her office visit, I would like to keep her on the same thyroid dose for now and recheck in 6 months. I will send her a letter with all of her results.  Thank you, Caci Orren M. Bethanne Mule, M.D.

## 2013-11-27 ENCOUNTER — Telehealth: Payer: Self-pay | Admitting: Family Medicine

## 2013-11-27 NOTE — Telephone Encounter (Signed)
[facility] is very booked up and patient is wanting referral sent to [facility]. She has been there before and they stated they will see her once referral is sent. [phone].

## 2013-11-28 NOTE — Telephone Encounter (Signed)
Patient calls again today, Would like to speak to [place]. Advised patient that [facility] had left for the day. Please call patient.

## 2013-11-29 NOTE — Telephone Encounter (Signed)
Called [facility] pt  Never been in that office. Pt has an appt in [facility] 12/27/2013. I LVM to [facility] referral coordinator to see how soon they can see pt.  Waiting for [facility] referral coordinator call back.   [place]

## 2013-12-02 ENCOUNTER — Other Ambulatory Visit: Payer: Self-pay | Admitting: Family Medicine

## 2013-12-02 DIAGNOSIS — M25511 Pain in right shoulder: Secondary | ICD-10-CM

## 2013-12-06 ENCOUNTER — Encounter: Payer: Self-pay | Admitting: Neurology

## 2013-12-06 ENCOUNTER — Ambulatory Visit (INDEPENDENT_AMBULATORY_CARE_PROVIDER_SITE_OTHER): Payer: Medicaid Other | Admitting: Neurology

## 2013-12-06 VITALS — BP 109/67 | HR 72 | Ht 60.0 in | Wt 113.0 lb

## 2013-12-06 DIAGNOSIS — C50919 Malignant neoplasm of unspecified site of unspecified female breast: Secondary | ICD-10-CM

## 2013-12-06 DIAGNOSIS — M79609 Pain in unspecified limb: Secondary | ICD-10-CM

## 2013-12-06 DIAGNOSIS — C773 Secondary and unspecified malignant neoplasm of axilla and upper limb lymph nodes: Secondary | ICD-10-CM

## 2013-12-06 DIAGNOSIS — C801 Malignant (primary) neoplasm, unspecified: Secondary | ICD-10-CM

## 2013-12-06 MED ORDER — GABAPENTIN 100 MG PO CAPS
ORAL_CAPSULE | ORAL | Status: DC
Start: 2013-12-06 — End: 2014-01-28

## 2013-12-06 NOTE — Progress Notes (Signed)
PATIENT: Debbie Larsen DOB: Mar 12, 1951  HISTORICAL  Debbie Larsen  Is a 63 years old right-handed [place] female, referred by neurosurgeon Dr. Vertell Limber  for evaluation of right arm pain, right hand weakness  She had a history of right breast  cancer status post right lobectomy, chemo radiaation therapy, in 2014, she noticed right axillary area pain, was diagnosed with right metastatic lymph node, underwent lymph node resection, followed by chemotherapy, therapy again, no she was diagnosed with metastatic lung cancer, has been treated with  Abraxane.    Since  2014, she noticed right arm deep achy pain, radiating pain to her right arm, especially ulnar forearm also gradual onset of right hand muscle atrophy, weakness,  she complains of difficulty using her right hand, such as writing, holding utensils    MRI of brain Jan 2014, w/wo, normal.  She denies gait difficulty, denies headaches    most recent PET scan, 09/19/2013 showed marked r progression in widespread blastic osseous metastatic disease without evidence of pathologic fracture. New hypermetabolic central liver lesion consistent with metastatic disease.  The mediastinal and right axillary nodal activity previously  demonstrated has improved with minimal residual central mediastinal activity.   She also had MRI of cervical spine dated April 2014,  which was severely degraded by motion, no obvious compressive lesion at the right cervical   MRI of right brachial plexus demonstrate nonspecific atrophy involving deltoid, subscapularis, teres minor, spinatus muscle, this is favored to be neurogenic, no definite mass, prominent disc bulging at C5-6, with suggestion of abnormal signal in the adjacent spinal cord,  She was evaluated at [facility],  electrodiagnostic study in March 20 first 2014, there was evidence of chronic neuropathic changes consistent with the right posterior cord brachial plexopathy with some involvement of right medial cord,   no evidence of right median neuropathy at the wrist, no evidence of right cervical radiculopathy.  REVIEW OF SYSTEMS: Full 14 system review of systems performed and notable only for right hand weakness, muscle atrophy, right arm pain   ALLERGIES: No Known Allergies  HOME MEDICATIONS: Outpatient Prescriptions Prior to Visit  Medication Sig Dispense Refill  . afatinib dimaleate (GILOTRIF) 30 MG tablet Take 1 tablet (30 mg total) by mouth daily. Take on an empty stomach 1hr before or 2hrs after meals.  30 tablet  2  . B Complex-C (B-COMPLEX WITH VITAMIN C) tablet Take 1 tablet by mouth daily.      Marland Kitchen levothyroxine (SYNTHROID, LEVOTHROID) 75 MCG tablet Take 1 tablet (75 mcg total) by mouth every evening.  30 tablet  3  . lidocaine-prilocaine (EMLA) cream Apply topically as needed. Apply to port 1-2 hours before procedure  30 g  0  . Multiple Vitamin (MULTIVITAMIN WITH MINERALS) TABS Take 1 tablet by mouth daily.       Facility-Administered Medications Prior to Visit  Medication Dose Route Frequency Provider Last Rate Last Dose  . heparin lock flush 100 unit/mL  500 Units Intravenous Once Chauncey Cruel, MD      . sodium chloride 0.9 % injection 10 mL  10 mL Intravenous PRN Chauncey Cruel, MD      . sodium chloride 0.9 % injection 10 mL  10 mL Intravenous PRN Chauncey Cruel, MD   10 mL at 07/09/13 1717  . sodium chloride 0.9 % injection 10 mL  10 mL Intracatheter PRN Chauncey Cruel, MD   10 mL at 09/03/13 1644    PAST MEDICAL HISTORY: Past Medical  History  Diagnosis Date  . 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 Hypothyroid   . Enlarged lymph nodes   . BREAST CANCER 04/2001    s/p radiation therapy, chemotherapy and lumpectomy  . 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  . S/P radiation therapy     Right breast/regional lymph nodes/ 5040 cGy / 28 fractions/ bost of 1200 cGy/ 6 Fractions  . Status post  chemotherapy     6 cycles of CMF completed on 12/12/2001  . Right shoulder pain     started late 2012  . Dry cough     intermittent  . SOB (shortness of breath)   . Use of letrozole (Femara) 03/14/2013  . Metastasis to bone 03/13/13    PET scan results  . Metastasis to bone     breast primary  . Cholelithiasis   . Anemia   . Cancer   . Stroke   . Lung cancer 10/08/2013    PAST SURGICAL HISTORY: Past Surgical History  Procedure Laterality Date  . Tonsillectomy    . Breast lumpectomy  04/2001    excision of L breast fibroadenoma by Dr.   . Breast lumpectomy  05/16/2001    Right axillary sentinel lymph node biopsy.  . Portacath placement  09/19/2012    Procedure: INSERTION PORT-A-CATH;  Surgeon: Haywood Lasso, MD;  Location: [place];  Service: General;  Laterality: N/A;  . Lipoma excision Right 05/15/2013    Procedure: RIGHT AXILLARY LYMPH NODE DISSECTION;  Surgeon: Haywood Lasso, MD;  Location: [facility];  Service: General;  Laterality: Right;    FAMILY HISTORY: Family History  Problem Relation Age of Onset  . Diabetes Mother   . Hypertension Father   . Hypertension Sister   . Hypothyroidism Brother   . Cancer Neg Hx     SOCIAL HISTORY:  History   Social History  . Marital Status: Divorced    Spouse Name: N/A    Number of Children: 1   . 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 Smoking status: Never Smoker   . Smokeless tobacco: Never Used  . Alcohol Use: No  . Drug Use: No  . Sexual Activity: Not Currently    Birth Control/ Protection: Post-menopausal   Other Topics Concern  . Not on file   Social History Narrative   Lives alone.    Has help from friends in her neighborhood.    Mom died of cardiac arrest, 14-Jul-2012.    Brother lives in [place], [place].   Sister lives in [place], [place].    Son lives in [place].      Patient is retired.   Divorced   Right handed   Education college    Caffeine tea       PHYSICAL EXAM   Filed Vitals:   12/06/13 1505  BP: 109/67  Pulse: 72  Height: 5' (1.524 m)  Weight: 113 lb (51.256 kg)    Not recorded    Body mass index is 22.07 kg/(m^2).   Generalized: In no acute distress  Neck: Supple, no carotid bruits   Cardiac: Regular rate rhythm  Pulmonary: Clear to auscultation bilaterally  Musculoskeletal: No deformity  Neurological examination  Mentation: Alert oriented to time, place, history taking, and causual conversation  Cranial nerve II-XII: Pupils were equal round reactive to light extraocular  movements were full, Visual field were full on confrontational test. Bilateral fundi were sharp.  Facial sensation and strength were normal. Hearing was intact to finger rubbing bilaterally. Uvula tongue midline.  head turning and shoulder shrug and were normal and symmetric.Tongue protrusion into cheek strength was normal.  Motor:  She has atrophy of right intrinsic hand muscles, grip 4 minus abductor pollicis brevis 3 finger abductor 3, wrist flexion 4 plus, extension 4, right shoulder abduction 4, external rotation 4 minus  Sensory examination: Decreased light touch at right hand all fingertips,      Coordination: Normal finger to nose, heel-to-shin bilaterally there was no truncal ataxia  Gait: Rising up from seated position without assistance, normal stance, without trunk ataxia, moderate stride, good arm swing, smooth turning, able to perform tiptoe, and heel walking without difficulty.   Romberg signs: Negative  Deep tendon reflexes: Brachioradialis 2/2, biceps 2/2, triceps 2/2, patellar 2/2, Achilles 2/2, plantar responses were flexor bilaterally.   DIAGNOSTIC DATA (LABS, IMAGING, TESTING) - I reviewed patient records, labs, notes, testing and imaging myself where available.  Lab Results  Component Value Date   WBC 6.0 11/05/2013   HGB 10.4* 11/05/2013   HCT 30.8* 11/05/2013   MCV 82.3 11/05/2013   PLT 222 11/05/2013       Component Value Date/Time   NA 137 11/05/2013 1500   NA 135 08/13/2013 1532   K 4.1 11/05/2013 1500   K 3.7 08/13/2013 1532   CL 99 08/13/2013 1532   CL 106 03/21/2013 1422   CO2 21* 11/05/2013 1500   CO2 22 08/13/2013 1532   GLUCOSE 108 11/05/2013 1500   GLUCOSE 142* 08/13/2013 1532   GLUCOSE 105* 03/21/2013 1422   BUN 9.4 11/05/2013 1500   BUN 8 08/13/2013 1532   CREATININE 0.8 11/05/2013 1500   CREATININE 0.66 08/13/2013 1532   CREATININE 0.74 03/01/2012 1715   CALCIUM 9.2 11/05/2013 1500   CALCIUM 9.4 08/13/2013 1532   PROT 7.4 11/05/2013 1500   PROT 7.3 08/13/2013 1532   ALBUMIN 4.0 11/05/2013 1500   ALBUMIN 3.9 08/13/2013 1532   AST 38* 11/05/2013 1500   AST 27 08/13/2013 1532   ALT 37 11/05/2013 1500   ALT 23 08/13/2013 1532   ALKPHOS 141 11/05/2013 1500   ALKPHOS 145* 08/13/2013 1532   BILITOT 0.29 11/05/2013 1500   BILITOT 0.3 08/13/2013 1532   GFRNONAA >90 09/18/2012 1531   GFRAA >90 09/18/2012 1531   Lab Results  Component Value Date   CHOL 161 11/20/2013   HDL 47 11/20/2013   LDLCALC 90 11/20/2013   TRIG 121 11/20/2013   CHOLHDL 3.4 11/20/2013   Lab Results  Component Value Date   HGBA1C 6.4* 05/12/2012   Lab Results  Component Value Date   VITAMINB12 1033* 06/26/2012   Lab Results  Component Value Date   TSH 5.813* 11/20/2013      ASSESSMENT AND PLAN    63 years old [place] female, with right metastatic cancer, involving lung, liver, right maxillary lymph node,  now presenting with right arm pain, right hand weakness, most consistent with right brachial plexopathy  1. she wants further evaluation to rule out metastatic disease, proceed with MRI of brain, cervical scan, with without contrast      2. Gabapentin for symptomatic control 3 return to clinic in one month's.   Marcial Pacas, M.D. Ph.D.  [facility] [address], [place], [place] [address] [phone]

## 2013-12-09 ENCOUNTER — Telehealth: Payer: Self-pay | Admitting: Internal Medicine

## 2013-12-09 NOTE — Telephone Encounter (Signed)
pt called and r/s lab to 2/18 per pt rqst

## 2013-12-11 ENCOUNTER — Other Ambulatory Visit: Payer: Medicaid Other

## 2013-12-11 ENCOUNTER — Ambulatory Visit (HOSPITAL_COMMUNITY): Payer: Medicaid Other

## 2013-12-18 ENCOUNTER — Other Ambulatory Visit (HOSPITAL_BASED_OUTPATIENT_CLINIC_OR_DEPARTMENT_OTHER): Payer: Medicaid Other

## 2013-12-18 ENCOUNTER — Ambulatory Visit
Admission: RE | Admit: 2013-12-18 | Discharge: 2013-12-18 | Disposition: A | Discharge status: Home / Self Care | Payer: Medicaid Other | Source: Ambulatory Visit | Attending: Neurology | Admitting: Neurology

## 2013-12-18 ENCOUNTER — Telehealth: Payer: Self-pay | Admitting: Internal Medicine

## 2013-12-18 ENCOUNTER — Encounter: Payer: Self-pay | Admitting: Internal Medicine

## 2013-12-18 ENCOUNTER — Ambulatory Visit (HOSPITAL_BASED_OUTPATIENT_CLINIC_OR_DEPARTMENT_OTHER): Payer: Medicaid Other

## 2013-12-18 ENCOUNTER — Ambulatory Visit (HOSPITAL_BASED_OUTPATIENT_CLINIC_OR_DEPARTMENT_OTHER): Payer: Medicaid Other | Admitting: Internal Medicine

## 2013-12-18 VITALS — BP 109/76 | HR 83 | Temp 98.8°F | Resp 18

## 2013-12-18 VITALS — BP 138/71 | HR 89 | Temp 98.9°F | Resp 18 | Ht 60.0 in | Wt 112.0 lb

## 2013-12-18 DIAGNOSIS — C801 Malignant (primary) neoplasm, unspecified: Secondary | ICD-10-CM

## 2013-12-18 DIAGNOSIS — C7951 Secondary malignant neoplasm of bone: Secondary | ICD-10-CM

## 2013-12-18 DIAGNOSIS — C787 Secondary malignant neoplasm of liver and intrahepatic bile duct: Secondary | ICD-10-CM

## 2013-12-18 DIAGNOSIS — C773 Secondary and unspecified malignant neoplasm of axilla and upper limb lymph nodes: Secondary | ICD-10-CM

## 2013-12-18 DIAGNOSIS — Z95828 Presence of other vascular implants and grafts: Secondary | ICD-10-CM

## 2013-12-18 DIAGNOSIS — C50919 Malignant neoplasm of unspecified site of unspecified female breast: Secondary | ICD-10-CM

## 2013-12-18 DIAGNOSIS — C7952 Secondary malignant neoplasm of bone marrow: Secondary | ICD-10-CM

## 2013-12-18 DIAGNOSIS — Z853 Personal history of malignant neoplasm of breast: Secondary | ICD-10-CM

## 2013-12-18 DIAGNOSIS — R21 Rash and other nonspecific skin eruption: Secondary | ICD-10-CM

## 2013-12-18 LAB — COMPREHENSIVE METABOLIC PANEL (CC13)
ALBUMIN: 3.9 g/dL (ref 3.5–5.0)
ALT: 67 U/L — ABNORMAL HIGH (ref 0–55)
AST: 58 U/L — AB (ref 5–34)
Alkaline Phosphatase: 148 U/L (ref 40–150)
Anion Gap: 10 mEq/L (ref 3–11)
BUN: 12.3 mg/dL (ref 7.0–26.0)
CO2: 22 mEq/L (ref 22–29)
CREATININE: 0.8 mg/dL (ref 0.6–1.1)
Calcium: 9.5 mg/dL (ref 8.4–10.4)
Chloride: 102 mEq/L (ref 98–109)
Glucose: 117 mg/dl (ref 70–140)
POTASSIUM: 3.9 meq/L (ref 3.5–5.1)
SODIUM: 134 meq/L — AB (ref 136–145)
Total Bilirubin: 0.32 mg/dL (ref 0.20–1.20)
Total Protein: 7.5 g/dL (ref 6.4–8.3)

## 2013-12-18 LAB — CBC WITH DIFFERENTIAL/PLATELET
BASO%: 0.5 % (ref 0.0–2.0)
Basophils Absolute: 0 10*3/uL (ref 0.0–0.1)
EOS%: 0.3 % (ref 0.0–7.0)
Eosinophils Absolute: 0 10*3/uL (ref 0.0–0.5)
HCT: 30.4 % — ABNORMAL LOW (ref 34.8–46.6)
HEMOGLOBIN: 10 g/dL — AB (ref 11.6–15.9)
LYMPH%: 25 % (ref 14.0–49.7)
MCH: 26.9 pg (ref 25.1–34.0)
MCHC: 32.8 g/dL (ref 31.5–36.0)
MCV: 82.2 fL (ref 79.5–101.0)
MONO#: 0.5 10*3/uL (ref 0.1–0.9)
MONO%: 7.8 % (ref 0.0–14.0)
NEUT%: 66.4 % (ref 38.4–76.8)
NEUTROS ABS: 4.2 10*3/uL (ref 1.5–6.5)
Platelets: 217 10*3/uL (ref 145–400)
RBC: 3.7 10*6/uL (ref 3.70–5.45)
RDW: 16.3 % — AB (ref 11.2–14.5)
WBC: 6.4 10*3/uL (ref 3.9–10.3)
lymph#: 1.6 10*3/uL (ref 0.9–3.3)

## 2013-12-18 MED ORDER — HEPARIN SOD (PORK) LOCK FLUSH 100 UNIT/ML IV SOLN
500.0000 [IU] | Freq: Once | INTRAVENOUS | Status: AC
  Administered 2013-12-18: 500 [IU] via INTRAVENOUS
  Filled 2013-12-18: qty 5

## 2013-12-18 MED ORDER — GADOBENATE DIMEGLUMINE 529 MG/ML IV SOLN: 10.0000 mL | Freq: Once | INTRAVENOUS | Status: AC | PRN

## 2013-12-18 MED ORDER — SODIUM CHLORIDE 0.9 % IJ SOLN
10.0000 mL | INTRAMUSCULAR | Status: DC | PRN
  Administered 2013-12-18: 10 mL via INTRAVENOUS
  Filled 2013-12-18: qty 10

## 2013-12-18 NOTE — Telephone Encounter (Signed)
gave pt appt for lab and MD for March , emailed [place] regarding chemo

## 2013-12-18 NOTE — Progress Notes (Signed)
[place] [phone]   [phone]  OFFICE PROGRESS NOTE  Melrose Nakayama, MD 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  2) history of right breast adenocarcinoma with positive ER/PR diagnosed in July of 2002.   MOLECULAR BIOMARKERS: The metastatic high-grade carcinoma diagnosed in October of 2013 was negative for ER, PR and HER-2. Next generation sequencing showed the tumor is positive for EGFR mutation [id number] in exon 19, SMARCB1, PIK3CA and HRAS>   PRIOR THERAPY:  1) status post right lumpectomy in July of 2002.  2) status post systemic chemotherapy with CMF for a total of 6 cycles completed 12/12/2001.  3) status post radiotherapy to the right breast and regional lymph nodes for a total dose of 5040 cGY with a boost of 1200 cGY completed in February 12, 2002.  4) the patient presented with right axillary lymphadenopathy in November of 2013 and excisional biopsy showed high grade carcinoma of unknown primary. PET scan at that time showed the right axillary lymphadenopathy in addition to bilateral mediastinum and left lung nodules.  5) She was treated as breast cancer with Abraxane until April of 2014. Repeat PET scan on 03/13/2013 showed evidence for disease progression.  6) Abraxane 100 mg/M2 on days 1 and 8 every 3 weeks, status post 9 cycles.   CURRENT THERAPY:  Gilotrif 30 mg by mouth daily, started 10/23/2013.   INTERVAL HISTORY: Debbie Larsen 63 y.o. female returns to the clinic today for follow up visit. The patient is tolerating her current treatment with Gilotrif fairly well with no significant adverse effects except for mild skin rash on the face but his diarrhea has significantly improved. She denied having any nausea or vomiting,  no fever or chills. She lost 2 pounds since her last visit. She still has fatigue especially in the legs. She denied having any significant chest pain, shortness of breath, cough or hemoptysis. Her CBC and comprehensive metabolic panel last week showed no significant abnormality except for the mild persistent anemia and slight elevation of her liver enzymes. She was seen recently by neurology for evaluation of right arm pain and right hand weakness. She had MRI of the brain and the chest performed earlier today. MRI of the chest showed widespread osseous metastatic disease with postradiation changes to the right upper lung. There is no definite soft tissue metastasis or inflammation along the course of the right brachial plexus. MRI of the brain report is still pending.   MEDICAL HISTORY: Past Medical History  Diagnosis Date  . 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 Hypothyroid   . Enlarged lymph nodes   . BREAST CANCER 04/2001    s/p radiation therapy, chemotherapy and lumpectomy  . 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  . S/P radiation therapy     Right breast/regional lymph nodes/ 5040 cGy / 28 fractions/ bost of 1200 cGy/ 6 Fractions  . Status post chemotherapy     6 cycles of CMF completed on 12/12/2001  . Right shoulder pain     started late 2012  . Dry cough     intermittent  . SOB (shortness of breath)   . Use of letrozole (Femara) 03/14/2013  .  Metastasis to bone 03/13/13    PET scan results  . Metastasis to bone     breast primary  . Cholelithiasis   . Anemia   . Cancer   . Stroke   . Lung cancer 10/08/2013    ALLERGIES:  has No Known Allergies.  MEDICATIONS:  Current Outpatient Prescriptions  Medication Sig Dispense Refill  . afatinib dimaleate (GILOTRIF) 30 MG tablet Take 1 tablet (30 mg total) by mouth daily. Take on an empty stomach 1hr before or 2hrs after meals.  30 tablet  2  . B  Complex-C (B-COMPLEX WITH VITAMIN C) tablet Take 1 tablet by mouth daily.      Marland Kitchen gabapentin (NEURONTIN) 100 MG capsule Take one to 3 tablets every night for right arm pain  90 capsule  12  . levothyroxine (SYNTHROID, LEVOTHROID) 75 MCG tablet Take 1 tablet (75 mcg total) by mouth every evening.  30 tablet  3  . lidocaine-prilocaine (EMLA) cream Apply topically as needed. Apply to port 1-2 hours before procedure  30 g  0  . Multiple Vitamin (MULTIVITAMIN WITH MINERALS) TABS Take 1 tablet by mouth daily.       No current facility-administered medications for this visit.   Facility-Administered Medications Ordered in Other Visits  Medication Dose Route Frequency Provider Last Rate Last Dose  . gadobenate dimeglumine (MULTIHANCE) injection 10 mL  10 mL Intravenous Once PRN Medication Radiologist, MD      . heparin lock flush 100 unit/mL  500 Units Intravenous Once Chauncey Cruel, MD      . sodium chloride 0.9 % injection 10 mL  10 mL Intravenous PRN Chauncey Cruel, MD      . sodium chloride 0.9 % injection 10 mL  10 mL Intravenous PRN Chauncey Cruel, MD   10 mL at 07/09/13 1717  . sodium chloride 0.9 % injection 10 mL  10 mL Intracatheter PRN Chauncey Cruel, MD   10 mL at 09/03/13 1644    SURGICAL HISTORY:  Past Surgical History  Procedure Laterality Date  . Tonsillectomy    . Breast lumpectomy  04/2001    excision of L breast fibroadenoma by Dr.   . Breast lumpectomy  05/16/2001    Right axillary sentinel lymph node biopsy.  . Portacath placement  09/19/2012    Procedure: INSERTION PORT-A-CATH;  Surgeon: Haywood Lasso, MD;  Location: [facility];  Service: General;  Laterality: N/A;  . Lipoma excision Right 05/15/2013    Procedure: RIGHT AXILLARY LYMPH NODE DISSECTION;  Surgeon: Haywood Lasso, MD;  Location: [facility];  Service: General;  Laterality: Right;    REVIEW OF SYSTEMS:  Constitutional: negative Eyes: negative Ears, nose, mouth, throat, and face:  negative Respiratory: negative Cardiovascular: negative Gastrointestinal: positive for diarrhea Genitourinary:negative Integument/breast: negative Hematologic/lymphatic: negative Musculoskeletal:negative Neurological: negative Behavioral/Psych: negative Endocrine: negative Allergic/Immunologic: negative   PHYSICAL EXAMINATION: General appearance: alert, cooperative and no distress Head: Normocephalic, without obvious abnormality, atraumatic Neck: no adenopathy, no JVD, supple, symmetrical, trachea midline and thyroid not enlarged, symmetric, no tenderness/mass/nodules Lymph nodes: Cervical, supraclavicular, and axillary nodes normal. Resp: clear to auscultation bilaterally Back: symmetric, no curvature. ROM normal. No CVA tenderness. Cardio: regular rate and rhythm, S1, S2 normal, no murmur, click, rub or gallop GI: soft, non-tender; bowel sounds normal; no masses,  no organomegaly Extremities: extremities normal, atraumatic, no cyanosis or edema Neurologic: Alert and oriented X 3, normal strength and tone. Normal symmetric reflexes. Normal coordination and gait  ECOG PERFORMANCE STATUS: 1 -  Symptomatic but completely ambulatory  Blood pressure 138/71, pulse 89, temperature 98.9 F (37.2 C), temperature source Oral, resp. rate 18, height 5' (1.524 m), weight 112 lb (50.803 kg), SpO2 100.00%.  LABORATORY DATA: Lab Results  Component Value Date   WBC 6.4 01-Jan-2014   HGB 10.0* 01/01/2014   HCT 30.4* 2014/01/01   MCV 82.2 01/01/14   PLT 217 Jan 01, 2014      Chemistry      Component Value Date/Time   NA 134* 2014-01-01 1557   NA 135 08/13/2013 1532   K 3.9 01/01/14 1557   K 3.7 08/13/2013 1532   CL 99 08/13/2013 1532   CL 106 03/21/2013 1422   CO2 22 2014/01/01 1557   CO2 22 08/13/2013 1532   BUN 12.3 01-01-2014 1557   BUN 8 08/13/2013 1532   CREATININE 0.8 January 01, 2014 1557   CREATININE 0.66 08/13/2013 1532   CREATININE 0.74 03/01/2012 1715      Component Value Date/Time    CALCIUM 9.5 01-Jan-2014 1557   CALCIUM 9.4 08/13/2013 1532   ALKPHOS 148 01-Jan-2014 1557   ALKPHOS 145* 08/13/2013 1532   AST 58* 01-01-14 1557   AST 27 08/13/2013 1532   ALT 67* 01/01/14 1557   ALT 23 08/13/2013 1532   BILITOT 0.32 01/01/14 1557   BILITOT 0.3 08/13/2013 1532       RADIOGRAPHIC STUDIES: Mr Chest W Wo Contrast  01/01/2014   CLINICAL DATA:  63 year old female with right tracheal plexopathy. History of right axillary node dissection, and right chest radiation for breast cancer. Metastatic disease to bone and liver.  EXAM: MRI CHEST WITHOUT AND WITH CONTRAST  TECHNIQUE: Multiplanar multisequence MR imaging of the chest was performed both before and after administration of intravenous contrast.  CONTRAST:  10 mL MultiHance.  COMPARISON:  PET-CT 10/08/2013.  FINDINGS: Study is degraded by motion artifact despite repeated imaging attempts.  Widespread heterogeneous marrow signal in the thoracic spine compatible with widespread osseous metastatic disease. The right humeral head and glenoid demonstrate preserved normal marrow signal. Trace right shoulder joint effusion is nonspecific. There is evidence of decreased marrow signal elsewhere in the right scapula. No definite right clavicle metastasis.  No abnormal soft tissue STIR signal or definite abnormal enhancement along the course of the right brachials plexus. Inhomogeneous fat saturation on some post-contrast images.  In the subjacent right upper lung there are chronic post radiation changes including pleural thickening.  No definite thoracic inlet or axillary soft tissue mass. Mild susceptibility artifact related to right axillary surgical clips.  IMPRESSION: 1. Study degraded and limited by motion artifact, despite repeated imaging attempts. 2. No definite soft tissue metastasis or inflammation along the course of the right brachials plexus, but a followup neck CT with IV contrast may provide relatively better diagnostic images in that  area. 3. Widespread osseous metastatic disease. Small nonspecific right shoulder joint effusion. Post radiation changes to the right upper lung.   Electronically Signed   By: Lars Pinks M.D.   On: 01/01/2014 15:19   ASSESSMENT AND PLAN: this is a very pleasant 63 years old [place] female with metastatic high-grade carcinoma of unknown primary questionable for metastatic lung cancer with positive EGFR mutation versus metastatic triple negative breast adenocarcinoma. Her recent PET scan showed significant evidence for disease progression with new liver lesion in addition to progressive metastatic bone disease but is improvement in the mediastinal lymphadenopathy. The patient has positive EGFR mutation in exon 19 and she is currently on treatment with Gilotrif (Afatinib) 30  mg by mouth daily.  The patient is tolerating her treatment fairly well except for mild skin rash and diarrhea resolved with Imodium. She was supposed to have repeat CT scan of the chest, abdomen and pelvis for restaging of her disease but her insurance decline doing the scan have that time because of the short interval since the last scan. I recommended for her to continue her current treatment with Gilotrif. I would see her back for followup visit in 2 weeks after repeating a PET scan for restaging of her disease and to see if the patient is benefiting from treatment with Gilotrif.  She will resume her treatment with Zometa next week. The patient had several questions today and I answered them completely to her satisfaction.  She was advised to call immediately if she has any concerning symptoms in the interval. The patient voices understanding of current disease status and treatment options and is in agreement with the current care plan.  All questions were answered. The patient knows to call the clinic with any problems, questions or concerns. We can certainly see the patient much sooner if necessary. I spent 20 minutes of  face-to-face counseling with the patient today out of the total time of the visit 30 minutes.  Disclaimer: This note was dictated with voice recognition software. Similar sounding words can inadvertently be transcribed and may not be corrected upon review.

## 2013-12-19 ENCOUNTER — Telehealth: Payer: Self-pay | Admitting: *Deleted

## 2013-12-19 ENCOUNTER — Other Ambulatory Visit: Payer: Self-pay | Admitting: Internal Medicine

## 2013-12-19 ENCOUNTER — Encounter: Payer: Self-pay | Admitting: *Deleted

## 2013-12-19 DIAGNOSIS — C3490 Malignant neoplasm of unspecified part of unspecified bronchus or lung: Secondary | ICD-10-CM

## 2013-12-19 DIAGNOSIS — C801 Malignant (primary) neoplasm, unspecified: Secondary | ICD-10-CM

## 2013-12-19 NOTE — Telephone Encounter (Signed)
Per staff message and POF I have scheduled appts.  JMW  

## 2013-12-19 NOTE — Telephone Encounter (Signed)
CALLED PATIENT TO INFORM OF APPT. FOR 12-23-13, LVM FOR  A RETURN CALL

## 2013-12-20 ENCOUNTER — Telehealth: Payer: Self-pay | Admitting: *Deleted

## 2013-12-20 ENCOUNTER — Telehealth: Payer: Self-pay | Admitting: Internal Medicine

## 2013-12-20 DIAGNOSIS — C3490 Malignant neoplasm of unspecified part of unspecified bronchus or lung: Secondary | ICD-10-CM

## 2013-12-20 MED ORDER — DEXAMETHASONE 4 MG PO TABS: 4.0000 mg | ORAL_TABLET | Freq: Two times a day (BID) | ORAL | Status: DC

## 2013-12-20 NOTE — Telephone Encounter (Signed)
Called and informed pt that we will be calling in Decadron 4mg  BID for her new brain mets.  Pt verbalized understanding.  SLJ

## 2013-12-20 NOTE — Telephone Encounter (Signed)
Talked to pt and he is aweare dof appt for february and March 2015

## 2013-12-23 ENCOUNTER — Telehealth: Payer: Self-pay | Admitting: *Deleted

## 2013-12-23 ENCOUNTER — Ambulatory Visit
Admission: RE | Admit: 2013-12-23 | Discharge: 2013-12-23 | Disposition: A | Discharge status: Home / Self Care | Payer: Medicaid Other | Source: Ambulatory Visit | Attending: Radiation Oncology | Admitting: Radiation Oncology

## 2013-12-23 ENCOUNTER — Encounter: Payer: Self-pay | Admitting: Radiation Oncology

## 2013-12-23 VITALS — BP 137/73 | HR 93 | Temp 99.1°F | Resp 20 | Wt 112.0 lb

## 2013-12-23 DIAGNOSIS — C801 Malignant (primary) neoplasm, unspecified: Secondary | ICD-10-CM | POA: Insufficient documentation

## 2013-12-23 DIAGNOSIS — Z9221 Personal history of antineoplastic chemotherapy: Secondary | ICD-10-CM | POA: Insufficient documentation

## 2013-12-23 DIAGNOSIS — Z510 Encounter for antineoplastic radiation therapy: Secondary | ICD-10-CM | POA: Insufficient documentation

## 2013-12-23 DIAGNOSIS — Z853 Personal history of malignant neoplasm of breast: Secondary | ICD-10-CM | POA: Insufficient documentation

## 2013-12-23 DIAGNOSIS — C7951 Secondary malignant neoplasm of bone: Secondary | ICD-10-CM | POA: Insufficient documentation

## 2013-12-23 DIAGNOSIS — C7949 Secondary malignant neoplasm of other parts of nervous system: Secondary | ICD-10-CM | POA: Insufficient documentation

## 2013-12-23 DIAGNOSIS — G936 Cerebral edema: Secondary | ICD-10-CM | POA: Insufficient documentation

## 2013-12-23 DIAGNOSIS — Z9010 Acquired absence of unspecified breast and nipple: Secondary | ICD-10-CM | POA: Insufficient documentation

## 2013-12-23 DIAGNOSIS — R110 Nausea: Secondary | ICD-10-CM | POA: Insufficient documentation

## 2013-12-23 DIAGNOSIS — C50919 Malignant neoplasm of unspecified site of unspecified female breast: Secondary | ICD-10-CM | POA: Insufficient documentation

## 2013-12-23 DIAGNOSIS — R29898 Other symptoms and signs involving the musculoskeletal system: Secondary | ICD-10-CM | POA: Insufficient documentation

## 2013-12-23 DIAGNOSIS — C7931 Secondary malignant neoplasm of brain: Secondary | ICD-10-CM | POA: Insufficient documentation

## 2013-12-23 DIAGNOSIS — R5383 Other fatigue: Secondary | ICD-10-CM

## 2013-12-23 DIAGNOSIS — C7952 Secondary malignant neoplasm of bone marrow: Secondary | ICD-10-CM

## 2013-12-23 DIAGNOSIS — R5381 Other malaise: Secondary | ICD-10-CM | POA: Insufficient documentation

## 2013-12-23 HISTORY — DX: Secondary malignant neoplasm of brain: C79.31

## 2013-12-23 HISTORY — DX: Secondary malignant neoplasm of other parts of nervous system: C79.49

## 2013-12-23 NOTE — Progress Notes (Signed)
CC: Dr. Curt Bears, Dr. Gunnar Bulla Magrinat  Followup note:  Diagnosis: Metastatic high-grade carcinoma of unknown primary, highly suspicious for lung cancer with positive EGFR mutation, breast cancer, or head and neck cancer, diagnosed in July 2012  History: [name] is a pleasant 63 year old female who is seen today at the request of Dr. 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 On 05/16/2001 she underwent a right partial mastectomy and a sentinel lymph node biopsy. One of 2 lymph nodes contained metastatic disease. The tumor was strongly ER/PR positive and HER-2 negative with a low proliferation index. At that time she declined a completion axillary node dissection. She completed 6 cycles of CMF chemotherapy and then received right breast and nodal irradiation and completed this on 02/12/2002. She states that she developed right shoulder discomfort in late 2012. She is found to have multiple right axillary lymph nodes and biopsy of a right axillary node on 08/23/2012 showed high-grade invasive ductal carcinoma which was ER/PR negative without HER-2/neu amplification. MRI of both breasts on 08/31/2012 showed multiple involved lymph nodes the right axilla. A PET scan on 09/12/2012 showed a right axillary recurrence but also bilateral mediastinal and left lung nodules consistent with stage IV disease. She was treated with Abraxane with evidence of response from November 2013 through April 2014. [facility]aging [facility] at [facility] and here showed metastatic disease to bone [facility] bone scan) and regression of mediastinal adenopathy but persistent nodularity within the right axilla consistent with an axillary  recurrence. A PET scan on 03/13/2013 showed worsening right axillary disease in addition to worsening right peritracheal adenopathy with additional mediastinal and left hilar adenopathy. She had interval development of bony metastatic disease to C7 vertebral body and pelvic bones. She tells me she developed progressive weakness of her right hand in the fall of 2013, but this remained stable during her chemotherapy. There was concern that she had a neuropathy that conceivably could be secondary to previous radiation therapy from 12 years ago. She had a cervical MRI scan on 02/21/2013 with "marginally diagnostic images" because of motion degradation. There was nothing obvious to explain her neuropathy. She underwent an excisional biopsy of her right axillary adenopathy in November 2013 showing high-grade carcinoma with a PET scan that time showing not only right axillary adenopathy but also bilateral mediastinal and left lung nodules. She was treated as breast cancer with Abraxane until April 2014 with a PET scan on 03/13/2013 showing disease progression. More recently, she has been on Gilotrif since December 2014 thinking this may represent an EGFR lung cancer. A recent MRI of the brain on 12/19/2013 showed multiple supratentorial and infratentorial metastases ranging from 1-5 mm. There was a larger 7 x 8 mm lesion along the left parietal region with some moderate vasogenic edema. There is also a right anterior temporal lesion measure 1.7 x 1.7 x 1.4 cm with adjacent vasogenic edema and a small right frontal dural enhancing lesion measure 1.8 x 0.6 cm. She was recently started on dexamethasone. He denies headaches, nausea, vomiting, or change in vision. 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  Physical examination: Alert and oriented. Filed Vitals:   12/23/13 1312  BP: 137/73  Pulse: 93  Temp: 99.1 F (37.3  C)  Resp: 20   Head and neck examination: There is some regrowth of hair. Nodes:  There is no palpable cervical, supraclavicular, or axillary lymphadenopathy. There is induration along the right axilla. Chest: Lungs clear. Abdomen: Without masses or hepatomegaly. Extremities: Without edema. Right upper extremity strength, both proximal and distal is 4/5. He is subjective decreased sensation along the right hand/forearm.  Laboratory data: Lab Results  Component Value Date   WBC 6.4 12/18/2013   HGB 10.0* 12/18/2013   HCT 30.4* 12/18/2013   MCV 82.2 12/18/2013   PLT 217 12/18/2013   CMP     Component Value Date/Time   NA 134* 12/18/2013 1557   NA 135 08/13/2013 1532   K 3.9 12/18/2013 1557   K 3.7 08/13/2013 1532   CL 99 08/13/2013 1532   CL 106 03/21/2013 1422   CO2 22 12/18/2013 1557   CO2 22 08/13/2013 1532   GLUCOSE 117 12/18/2013 1557   GLUCOSE 142* 08/13/2013 1532   GLUCOSE 105* 03/21/2013 1422   BUN 12.3 12/18/2013 1557   BUN 8 08/13/2013 1532   CREATININE 0.8 12/18/2013 1557   CREATININE 0.66 08/13/2013 1532   CREATININE 0.74 03/01/2012 1715   CALCIUM 9.5 12/18/2013 1557   CALCIUM 9.4 08/13/2013 1532   PROT 7.5 12/18/2013 1557   PROT 7.3 08/13/2013 1532   ALBUMIN 3.9 12/18/2013 1557   ALBUMIN 3.9 08/13/2013 1532   AST 58* 12/18/2013 1557   AST 27 08/13/2013 1532   ALT 67* 12/18/2013 1557   ALT 23 08/13/2013 1532   ALKPHOS 148 12/18/2013 1557   ALKPHOS 145* 08/13/2013 1532   BILITOT 0.32 12/18/2013 1557   BILITOT 0.3 08/13/2013 1532   GFRNONAA >90 09/18/2012 1531   GFRAA >90 09/18/2012 1531    Impression: Metastatic high-grade carcinoma of suspected lung versus breast origin. I reviewed her MRI scan with her. I explained to her that she would benefit from 2 weeks of whole brain radiotherapy to prevent neurologic sequelae. We discussed the potential acute and late toxicities of radiation therapy, and consent is signed today. She is on dexamethasone which can be tapered by Dr. Julien Nordmann who will see her next week.  Plan: She will undergo CT simulation today and begin  her radiation therapy this Wednesday, February 25.  30 minutes was spent face-to-face with the patient, primarily counseling the patient and coordinate eating her care.

## 2013-12-23 NOTE — Progress Notes (Signed)
Please see the Nurse Progress Note in the MD Initial Consult Encounter for this patient. 

## 2013-12-23 NOTE — Progress Notes (Signed)
Complex simulation/treatment planning note: The patient was taken to the CT simulator. A custom head cast was constructed for immobilization. Her head/brain was scanned. I chose an isocenter along the central brain. Her eyes, lenses, and temporal lobes were contoured. She was set up to left and right lateral fields. 2 sets of multileaf collimators are designed to conform the field. I prescribing 3000 cGy in 10 sessions utilizing 6 MV photons.

## 2013-12-23 NOTE — Progress Notes (Signed)
Location/Histology of Brain Tumor:  12/18/13 MRI Brain MPRESSION:  Abnormal MRI brain (with and without) demonstrating:  1. Multiple parenchymal and 2 dural based metastatic lesions: Multiple supratentorial and infratentorial enhancing (some homogenous and some ring pattern) lesions, ranging in size from 1-74mm. A slightly larger left parietal lesion measuring 7x56mm in the axial plane, has some surrounding moderate vasogenic edema, but without any midline shift. Dural based, right anterior temporal, enhancing lesion, measuring 1.7x1.7x1.4cm (APxtransxSI), with significant adjacent parenchymal vasogenic edema, but without any midline shift. A smaller right frontal dural enhancing lesion noted as well (1.8x0.6cm on axial views).  2. Patchy areas of abnormal marrow signal in the calvarium, possibly representing bony metastases.  3. Compared to MRI on 11/27/12, these lesions are new findings.   Patient presented with symptoms of:  right arm pain, right hand weakness  Past or anticipated interventions, if any, per neurosurgery: none  Past or anticipated interventions, if any, per medical oncology: PRIOR THERAPY:  1) status post right lumpectomy in July of 2002.  2) status post systemic chemotherapy with CMF for a total of 6 cycles completed 12/12/2001.  3) status post radiotherapy to the right breast and regional lymph nodes for a total dose of 5040 cGY with a boost of 1200 cGY completed in February 12, 2002.  4) the patient presented with right axillary lymphadenopathy in November of 2013 and excisional biopsy showed high grade carcinoma of unknown primary. PET scan at that time showed the right axillary lymphadenopathy in addition to bilateral mediastinum and left lung nodules.  5) She was treated as breast cancer with Abraxane until April of 2014. Repeat PET scan on 03/13/2013 showed evidence for disease progression.  6) Abraxane 100 mg/M2 on days 1 and 8 every 3 weeks, status post 9 cycles.  CURRENT  THERAPY: Gilotrif 30 mg by mouth daily, started 10/23/2013.   Dose of Decadron, if applicable: 4 mg twice daily  Recent neurologic symptoms, if any:   Seizures: no  Headaches: no  Nausea: occasional  Dizziness/ataxia: yes  Difficulty with hand coordination: no  Focal numbness/weakness: right hand weakness which is chronic  Visual deficits/changes: no  Confusion/Memory deficits: no  Painful bone metastases at present, if any: no  SAFETY ISSUES:  Prior radiation? Yes: 12/26/01- 02/12/02 right breast, regional lymph nodes, 5040 cGy in 28 sessions. Right breast boost: 12 cGy in 6 sessions.  Pacemaker/ICD? no  Possible current pregnancy? no  Is the patient on methotrexate? no  Additional Complaints / other details: Pt alert, oriented x 3. Walks haltingly. She is using a cane and states she has to hold onto shopping cart if in store. Pt denies headache, blurred vision. She reports occasional nausea, dizziness, "stumbling but not falling", pain in her lower legs x approximately 1 week w/walking.

## 2013-12-23 NOTE — Telephone Encounter (Signed)
CALLED PATIENT TO INQUIRE ABOUT COMING TO TODAY'S APPT., LVM FOR A RETURN CALL

## 2013-12-24 ENCOUNTER — Ambulatory Visit: Payer: Medicaid Other

## 2013-12-24 ENCOUNTER — Other Ambulatory Visit: Payer: Self-pay | Admitting: Medical Oncology

## 2013-12-24 ENCOUNTER — Telehealth: Payer: Self-pay | Admitting: Neurology

## 2013-12-24 DIAGNOSIS — G540 Brachial plexus disorders: Secondary | ICD-10-CM

## 2013-12-24 NOTE — Addendum Note (Signed)
Encounter addended by: Andria Rhein, RN on: 12/24/2013  4:49 PM<BR>     Documentation filed: Charges VN

## 2013-12-24 NOTE — Telephone Encounter (Signed)
Patient is calling back to speak with someone regarding the next steps of her MRI. Patient is very insistent on speaking to someone as soon as possible. Please call.

## 2013-12-24 NOTE — Telephone Encounter (Signed)
Patient calling back for results of her MRI. She is requesting to speak with Hinton Dyer. Please call to advise.

## 2013-12-25 ENCOUNTER — Telehealth: Payer: Self-pay | Admitting: *Deleted

## 2013-12-25 ENCOUNTER — Telehealth: Payer: Self-pay | Admitting: Neurology

## 2013-12-25 ENCOUNTER — Ambulatory Visit
Admission: RE | Admit: 2013-12-25 | Discharge: 2013-12-25 | Disposition: A | Discharge status: Home / Self Care | Payer: Medicaid Other | Source: Ambulatory Visit | Attending: Radiation Oncology | Admitting: Radiation Oncology

## 2013-12-25 DIAGNOSIS — C7931 Secondary malignant neoplasm of brain: Secondary | ICD-10-CM

## 2013-12-25 DIAGNOSIS — C7949 Secondary malignant neoplasm of other parts of nervous system: Principal | ICD-10-CM

## 2013-12-25 NOTE — Telephone Encounter (Signed)
MR-chest,MR-Brain W WO

## 2013-12-25 NOTE — Telephone Encounter (Signed)
Gave results to Dr Krista Blue for review

## 2013-12-25 NOTE — Telephone Encounter (Signed)
PT called in asking to speak to Dr. Krista Blue to see if she can get the results of the MRIs she had last week.  Please reference message dated 12-24-13.  She asked if Dr. Krista Blue could call her back sometime today she would be grateful.  Thank you

## 2013-12-25 NOTE — Telephone Encounter (Signed)
Informed pt that CT of the abdomen and Pelvis was approved, but PET was denied.  Pt prefers to have PET scan.  Informed her it is not approved at this time and Dr Vista Mink recommends the CT of the abdomen and pelvis.  She states she will not do either right now while taking radiation but will call and schedule the CT scan or see if the PET can get approved in about 2 weeks.  Will inform Dr Vista Mink.  SLJ

## 2013-12-25 NOTE — Telephone Encounter (Signed)
I have called patient, MRI of the brain showed metastatic brain lesions this was reviewed by her oncologist, she is having brain radiation therapy, there was also evidence of widespread bone metastatic lesions.  She continued to have right arm weakness, which is due to radiation induced right brachial plexopathy, she insisted on having more home occupational therapy, order is placed

## 2013-12-25 NOTE — Telephone Encounter (Signed)
Spoke with patient and informed that report had not been sent from [facility] and that I will call to get results, she verbalized understanding. [facility] Report faxed, was given to Dr Krista Blue for review, explained to patient that once report has been reviewed , wanted  Dr Krista Blue to call her today, possibly after lunch with results

## 2013-12-25 NOTE — Addendum Note (Signed)
Addended by: Marcial Pacas on: 12/25/2013 04:41 PM   Modules accepted: Orders

## 2013-12-25 NOTE — Telephone Encounter (Signed)
I printed out reports and gave to Dr. Krista Blue to call pt Home # first, the cell.   Dr. Krista Blue stated she would call.

## 2013-12-26 ENCOUNTER — Ambulatory Visit: Payer: Medicaid Other

## 2013-12-26 NOTE — Progress Notes (Signed)
Simulation verification note: The patient underwent simulation verification on 12/25/2013 for treatment to her whole brain. She was set up to lateral fields. The isocenter is in good position and the multileaf collimators contoured the treatment volume appropriately.

## 2013-12-27 ENCOUNTER — Ambulatory Visit (HOSPITAL_COMMUNITY): Payer: Medicaid Other

## 2013-12-27 ENCOUNTER — Ambulatory Visit: Payer: Medicaid Other

## 2013-12-27 ENCOUNTER — Ambulatory Visit: Payer: Medicaid Other | Admitting: Neurology

## 2013-12-30 ENCOUNTER — Ambulatory Visit
Admission: RE | Admit: 2013-12-30 | Discharge: 2013-12-30 | Disposition: A | Discharge status: Home / Self Care | Payer: Medicaid Other | Source: Ambulatory Visit | Attending: Radiation Oncology | Admitting: Radiation Oncology

## 2013-12-30 VITALS — BP 135/68 | HR 83 | Temp 98.7°F | Wt 108.1 lb

## 2013-12-30 DIAGNOSIS — C7949 Secondary malignant neoplasm of other parts of nervous system: Principal | ICD-10-CM

## 2013-12-30 DIAGNOSIS — C7931 Secondary malignant neoplasm of brain: Secondary | ICD-10-CM

## 2013-12-30 NOTE — Progress Notes (Signed)
Patient for weekly assessment of radiation to whole brain.completed 2 of 10 treatments.reviewed routine of clinic and side effects to include fatigue, nausea and headache.Patient is on dexamethasone.Has occasional nausea.thins she has compazine at home, to check and let us know.Asks a lot of questions regarding use of steroid therapy and why she feels weak.States leg pain and weakness is new..Uses cane and brought to nursing in wheelchair.

## 2013-12-30 NOTE — Progress Notes (Signed)
Weekly Management Note:  Site: Whole brain Current Dose:  600  cGy Projected Dose: 3000  cGy  Narrative: The patient is seen today for routine under treatment assessment. CBCT/MVCT images/port films were reviewed. The chart was reviewed.   She is without new complaints today except for some fatigue and lower extremity weakness, right greater than left. She denies headaches, nausea, or vomiting. She is taking 4 mg of dexamethasone by mouth twice a day.  Physical Examination:  Filed Vitals:   12/30/13 1658  BP: 135/68  Pulse: 83  Temp: 98.7 F (37.1 C)  .  Weight: 108 lb 1.6 oz (49.034 kg). No significant skin changes. Neurologic examination is grossly unremarkable.  Impression: Tolerating radiation therapy well.  Plan: Continue radiation therapy as planned.

## 2013-12-31 ENCOUNTER — Other Ambulatory Visit: Payer: Self-pay | Admitting: Radiation Oncology

## 2013-12-31 ENCOUNTER — Telehealth: Payer: Self-pay | Admitting: *Deleted

## 2013-12-31 ENCOUNTER — Ambulatory Visit
Admission: RE | Admit: 2013-12-31 | Discharge: 2013-12-31 | Disposition: A | Discharge status: Home / Self Care | Payer: Medicaid Other | Source: Ambulatory Visit | Attending: Radiation Oncology | Admitting: Radiation Oncology

## 2013-12-31 DIAGNOSIS — C7931 Secondary malignant neoplasm of brain: Secondary | ICD-10-CM

## 2013-12-31 DIAGNOSIS — C7949 Secondary malignant neoplasm of other parts of nervous system: Principal | ICD-10-CM

## 2013-12-31 MED ORDER — PROCHLORPERAZINE MALEATE 10 MG PO TABS: 10.0000 mg | ORAL_TABLET | Freq: Four times a day (QID) | ORAL | Status: DC | PRN

## 2013-12-31 NOTE — Telephone Encounter (Signed)
Called patient to inform her that her referral has already been sent other the occupational therapy. I also inform her that she should be getting a call from them to set up an appt. I advised the patient that if she has any other problems, questions or concerns to call the office. Patient verbalized understanding.

## 2014-01-01 ENCOUNTER — Inpatient Hospital Stay: Payer: Medicaid Other

## 2014-01-01 ENCOUNTER — Ambulatory Visit: Payer: Medicaid Other | Admitting: Physician Assistant

## 2014-01-01 ENCOUNTER — Other Ambulatory Visit: Payer: Medicaid Other

## 2014-01-01 ENCOUNTER — Ambulatory Visit
Admission: RE | Admit: 2014-01-01 | Discharge: 2014-01-01 | Disposition: A | Discharge status: Home / Self Care | Payer: Medicaid Other | Source: Ambulatory Visit | Attending: Radiation Oncology | Admitting: Radiation Oncology

## 2014-01-02 ENCOUNTER — Ambulatory Visit
Admission: RE | Admit: 2014-01-02 | Discharge: 2014-01-02 | Disposition: A | Discharge status: Home / Self Care | Payer: Medicaid Other | Source: Ambulatory Visit | Attending: Radiation Oncology | Admitting: Radiation Oncology

## 2014-01-03 ENCOUNTER — Ambulatory Visit
Admission: RE | Admit: 2014-01-03 | Discharge: 2014-01-03 | Disposition: A | Discharge status: Home / Self Care | Payer: Medicaid Other | Source: Ambulatory Visit | Attending: Radiation Oncology | Admitting: Radiation Oncology

## 2014-01-06 ENCOUNTER — Ambulatory Visit
Admission: RE | Admit: 2014-01-06 | Discharge: 2014-01-06 | Disposition: A | Discharge status: Home / Self Care | Payer: Medicaid Other | Source: Ambulatory Visit | Attending: Radiation Oncology | Admitting: Radiation Oncology

## 2014-01-06 VITALS — BP 106/62 | HR 97 | Temp 98.4°F | Wt 107.1 lb

## 2014-01-06 DIAGNOSIS — C7949 Secondary malignant neoplasm of other parts of nervous system: Principal | ICD-10-CM

## 2014-01-06 DIAGNOSIS — C7931 Secondary malignant neoplasm of brain: Secondary | ICD-10-CM

## 2014-01-06 NOTE — Progress Notes (Signed)
Patient here for weekly assessment  Of radiation to brain.Completed 7 of 10 treatments.denies headache or nausea.Feels really tired and has diminished appetite.states she drinks smoothies with protein and fruit.Wants me to call [facility] to inquire about status of home health aid.

## 2014-01-06 NOTE — Progress Notes (Signed)
Weekly Management Note:  Site: Whole brain Current Dose:  2100  cGy Projected Dose: 3000  cGy  Narrative: The patient is seen today for routine under treatment assessment. CBCT/MVCT images/port films were reviewed. The chart was reviewed.   She reports mild fatigue and lower extremity fatigue/weakness. She states that she is taking 2 mg of dexamethasone by mouth twice a day although her medication list shows 4 mg by mouth twice a day. She does have mild nausea.  Physical Examination:  Filed Vitals:   01/06/14 1647  BP: 106/62  Pulse: 97  Temp: 98.4 F (36.9 C)  .  Weight: 107 lb 1.6 oz (48.58 kg). No change.  Impression: Tolerating radiation therapy well. She will finish her whole brain radiation therapy this Thursday.  Plan: Continue radiation therapy as planned.

## 2014-01-07 ENCOUNTER — Ambulatory Visit: Payer: Medicaid Other

## 2014-01-07 ENCOUNTER — Ambulatory Visit
Admission: RE | Admit: 2014-01-07 | Discharge: 2014-01-07 | Disposition: A | Discharge status: Home / Self Care | Payer: Medicaid Other | Source: Ambulatory Visit | Attending: Radiation Oncology | Admitting: Radiation Oncology

## 2014-01-07 ENCOUNTER — Encounter: Payer: Self-pay | Admitting: Internal Medicine

## 2014-01-07 NOTE — Telephone Encounter (Signed)
Will hold off home health referral at this point

## 2014-01-07 NOTE — Progress Notes (Signed)
Patient came in inquiring about getting discount 100% again. I advised no longer have and hers expired 09/15/13. I cut up the card. I forward to Lenise a bill she had for 60.00 to get discount applied if not already done. I advised her to call billing to see if she would qualify for any more asst. She had not idea what her balance with [facility] is.

## 2014-01-07 NOTE — Telephone Encounter (Signed)
Patient cannot be scheduled for just home health OT.  There has to be other services provided.  Dr. Krista Blue do you want to order something else?

## 2014-01-08 ENCOUNTER — Ambulatory Visit (HOSPITAL_COMMUNITY): Admission: RE | Admit: 2014-01-08 | Payer: Medicaid Other | Source: Ambulatory Visit

## 2014-01-08 ENCOUNTER — Ambulatory Visit
Admission: RE | Admit: 2014-01-08 | Discharge: 2014-01-08 | Disposition: A | Discharge status: Home / Self Care | Payer: Medicaid Other | Source: Ambulatory Visit | Attending: Radiation Oncology | Admitting: Radiation Oncology

## 2014-01-08 ENCOUNTER — Telehealth: Payer: Self-pay | Admitting: *Deleted

## 2014-01-08 NOTE — Telephone Encounter (Signed)
Per pt request, home health referral made to [facility].  Form filled out and faxed to [phone] and [phone].  Form can take up to 48 hrs to process.    Contact for [facility]: Nick--direct ph# [phone]  SLJ

## 2014-01-09 ENCOUNTER — Encounter: Payer: Self-pay | Admitting: Radiation Oncology

## 2014-01-09 ENCOUNTER — Ambulatory Visit
Admission: RE | Admit: 2014-01-09 | Discharge: 2014-01-09 | Disposition: A | Discharge status: Home / Self Care | Payer: Medicaid Other | Source: Ambulatory Visit | Attending: Radiation Oncology | Admitting: Radiation Oncology

## 2014-01-09 NOTE — Progress Notes (Signed)
[facility] End of Treatment Note  Name:Debbie Larsen  Date: 01/09/2014 CVU:[id number] DOB:01-13-1951   Status:outpatient    CC: Melrose Nakayama, MD  Dr. Curt Bears  REFERRING PHYSICIAN:  Dr. 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 She is prescribed 4 mg of dexamethasone by mouth twice a day although she tells me she is only taking 2 mg by mouth twice a day.                          PLAN: Routine followup in one month. Patient instructed to call if questions or worsening complaints in interim.

## 2014-01-10 ENCOUNTER — Ambulatory Visit: Payer: Medicaid Other | Admitting: Neurology

## 2014-01-13 ENCOUNTER — Inpatient Hospital Stay (HOSPITAL_COMMUNITY)
Admission: EM | Admit: 2014-01-13 | Discharge: 2014-01-28 | DRG: 054 | Disposition: A | Discharge status: Skilled Nursing Facility | Payer: Medicaid Other | Attending: Internal Medicine | Admitting: Internal Medicine

## 2014-01-13 ENCOUNTER — Encounter (HOSPITAL_COMMUNITY): Payer: Self-pay | Admitting: Emergency Medicine

## 2014-01-13 DIAGNOSIS — C7949 Secondary malignant neoplasm of other parts of nervous system: Principal | ICD-10-CM

## 2014-01-13 DIAGNOSIS — R630 Anorexia: Secondary | ICD-10-CM

## 2014-01-13 DIAGNOSIS — R531 Weakness: Secondary | ICD-10-CM | POA: Diagnosis present

## 2014-01-13 DIAGNOSIS — C7951 Secondary malignant neoplasm of bone: Secondary | ICD-10-CM | POA: Diagnosis present

## 2014-01-13 DIAGNOSIS — G540 Brachial plexus disorders: Secondary | ICD-10-CM | POA: Diagnosis present

## 2014-01-13 DIAGNOSIS — Z923 Personal history of irradiation: Secondary | ICD-10-CM

## 2014-01-13 DIAGNOSIS — Z66 Do not resuscitate: Secondary | ICD-10-CM | POA: Diagnosis not present

## 2014-01-13 DIAGNOSIS — D6959 Other secondary thrombocytopenia: Secondary | ICD-10-CM | POA: Diagnosis present

## 2014-01-13 DIAGNOSIS — E43 Unspecified severe protein-calorie malnutrition: Secondary | ICD-10-CM | POA: Diagnosis present

## 2014-01-13 DIAGNOSIS — Z79899 Other long term (current) drug therapy: Secondary | ICD-10-CM

## 2014-01-13 DIAGNOSIS — C768 Malignant neoplasm of other specified ill-defined sites: Secondary | ICD-10-CM | POA: Diagnosis present

## 2014-01-13 DIAGNOSIS — F411 Generalized anxiety disorder: Secondary | ICD-10-CM | POA: Diagnosis present

## 2014-01-13 DIAGNOSIS — Z7401 Bed confinement status: Secondary | ICD-10-CM

## 2014-01-13 DIAGNOSIS — C7931 Secondary malignant neoplasm of brain: Principal | ICD-10-CM | POA: Diagnosis present

## 2014-01-13 DIAGNOSIS — R609 Edema, unspecified: Secondary | ICD-10-CM | POA: Diagnosis present

## 2014-01-13 DIAGNOSIS — D696 Thrombocytopenia, unspecified: Secondary | ICD-10-CM

## 2014-01-13 DIAGNOSIS — E039 Hypothyroidism, unspecified: Secondary | ICD-10-CM | POA: Diagnosis present

## 2014-01-13 DIAGNOSIS — C3490 Malignant neoplasm of unspecified part of unspecified bronchus or lung: Secondary | ICD-10-CM | POA: Diagnosis present

## 2014-01-13 DIAGNOSIS — IMO0001 Reserved for inherently not codable concepts without codable children: Secondary | ICD-10-CM | POA: Diagnosis present

## 2014-01-13 DIAGNOSIS — R5383 Other fatigue: Secondary | ICD-10-CM

## 2014-01-13 DIAGNOSIS — T66XXXS Radiation sickness, unspecified, sequela: Secondary | ICD-10-CM

## 2014-01-13 DIAGNOSIS — Z9221 Personal history of antineoplastic chemotherapy: Secondary | ICD-10-CM

## 2014-01-13 DIAGNOSIS — Z8673 Personal history of transient ischemic attack (TIA), and cerebral infarction without residual deficits: Secondary | ICD-10-CM

## 2014-01-13 DIAGNOSIS — IMO0002 Reserved for concepts with insufficient information to code with codable children: Secondary | ICD-10-CM

## 2014-01-13 DIAGNOSIS — C7952 Secondary malignant neoplasm of bone marrow: Secondary | ICD-10-CM

## 2014-01-13 DIAGNOSIS — C787 Secondary malignant neoplasm of liver and intrahepatic bile duct: Secondary | ICD-10-CM | POA: Diagnosis present

## 2014-01-13 DIAGNOSIS — C801 Malignant (primary) neoplasm, unspecified: Secondary | ICD-10-CM

## 2014-01-13 DIAGNOSIS — Y842 Radiological procedure and radiotherapy as the cause of abnormal reaction of the patient, or of later complication, without mention of misadventure at the time of the procedure: Secondary | ICD-10-CM | POA: Diagnosis present

## 2014-01-13 DIAGNOSIS — G893 Neoplasm related pain (acute) (chronic): Secondary | ICD-10-CM | POA: Diagnosis present

## 2014-01-13 DIAGNOSIS — R748 Abnormal levels of other serum enzymes: Secondary | ICD-10-CM | POA: Diagnosis present

## 2014-01-13 DIAGNOSIS — K5900 Constipation, unspecified: Secondary | ICD-10-CM | POA: Diagnosis not present

## 2014-01-13 DIAGNOSIS — R627 Adult failure to thrive: Secondary | ICD-10-CM | POA: Diagnosis present

## 2014-01-13 DIAGNOSIS — Z853 Personal history of malignant neoplasm of breast: Secondary | ICD-10-CM

## 2014-01-13 DIAGNOSIS — E871 Hypo-osmolality and hyponatremia: Secondary | ICD-10-CM | POA: Diagnosis present

## 2014-01-13 DIAGNOSIS — F22 Delusional disorders: Secondary | ICD-10-CM | POA: Diagnosis present

## 2014-01-13 DIAGNOSIS — E236 Other disorders of pituitary gland: Secondary | ICD-10-CM | POA: Diagnosis present

## 2014-01-13 DIAGNOSIS — E46 Unspecified protein-calorie malnutrition: Secondary | ICD-10-CM | POA: Diagnosis present

## 2014-01-13 DIAGNOSIS — G8220 Paraplegia, unspecified: Secondary | ICD-10-CM

## 2014-01-13 DIAGNOSIS — F064 Anxiety disorder due to known physiological condition: Secondary | ICD-10-CM | POA: Diagnosis present

## 2014-01-13 DIAGNOSIS — Z859 Personal history of malignant neoplasm, unspecified: Secondary | ICD-10-CM

## 2014-01-13 DIAGNOSIS — T451X5A Adverse effect of antineoplastic and immunosuppressive drugs, initial encounter: Secondary | ICD-10-CM | POA: Diagnosis present

## 2014-01-13 DIAGNOSIS — R5381 Other malaise: Secondary | ICD-10-CM | POA: Diagnosis present

## 2014-01-13 DIAGNOSIS — C50919 Malignant neoplasm of unspecified site of unspecified female breast: Secondary | ICD-10-CM | POA: Diagnosis present

## 2014-01-13 DIAGNOSIS — D6481 Anemia due to antineoplastic chemotherapy: Secondary | ICD-10-CM | POA: Diagnosis present

## 2014-01-13 DIAGNOSIS — D649 Anemia, unspecified: Secondary | ICD-10-CM

## 2014-01-13 DIAGNOSIS — R339 Retention of urine, unspecified: Secondary | ICD-10-CM

## 2014-01-13 DIAGNOSIS — R29898 Other symptoms and signs involving the musculoskeletal system: Secondary | ICD-10-CM | POA: Diagnosis present

## 2014-01-13 LAB — COMPREHENSIVE METABOLIC PANEL
ALBUMIN: 3.7 g/dL (ref 3.5–5.2)
ALT: 77 U/L — AB (ref 0–35)
AST: 70 U/L — AB (ref 0–37)
Alkaline Phosphatase: 187 U/L — ABNORMAL HIGH (ref 39–117)
BUN: 7 mg/dL (ref 6–23)
CALCIUM: 9.1 mg/dL (ref 8.4–10.5)
CO2: 22 mEq/L (ref 19–32)
CREATININE: 0.6 mg/dL (ref 0.50–1.10)
Chloride: 86 mEq/L — ABNORMAL LOW (ref 96–112)
GFR calc Af Amer: >90 mL/min (ref >90)
GFR calc non Af Amer: >90 mL/min (ref >90)
Glucose, Bld: 111 mg/dL — ABNORMAL HIGH (ref 70–99)
Potassium: 3.7 mEq/L (ref 3.7–5.3)
SODIUM: 124 meq/L — AB (ref 137–147)
TOTAL PROTEIN: 7.2 g/dL (ref 6.0–8.3)
Total Bilirubin: 0.5 mg/dL (ref 0.3–1.2)

## 2014-01-13 LAB — CBC WITH DIFFERENTIAL/PLATELET
BASOS PCT: 0 % (ref 0–1)
Basophils Absolute: 0 10*3/uL (ref 0.0–0.1)
EOS PCT: 0 % (ref 0–5)
Eosinophils Absolute: 0 10*3/uL (ref 0.0–0.7)
HEMATOCRIT: 30.9 % — AB (ref 36.0–46.0)
Hemoglobin: 10.9 g/dL — ABNORMAL LOW (ref 12.0–15.0)
LYMPHS PCT: 10 % — AB (ref 12–46)
Lymphs Abs: 0.5 10*3/uL — ABNORMAL LOW (ref 0.7–4.0)
MCH: 27.1 pg (ref 26.0–34.0)
MCHC: 35.3 g/dL (ref 30.0–36.0)
MCV: 76.9 fL — AB (ref 78.0–100.0)
MONO ABS: 0.4 10*3/uL (ref 0.1–1.0)
Monocytes Relative: 8 % (ref 3–12)
Neutro Abs: 4 10*3/uL (ref 1.7–7.7)
Neutrophils Relative %: 81 % — ABNORMAL HIGH (ref 43–77)
PLATELETS: 81 10*3/uL — AB (ref 150–400)
RBC: 4.02 MIL/uL (ref 3.87–5.11)
RDW: 15.2 % (ref 11.5–15.5)
WBC: 4.9 10*3/uL (ref 4.0–10.5)

## 2014-01-13 LAB — URINALYSIS, ROUTINE W REFLEX MICROSCOPIC
BILIRUBIN URINE: NEGATIVE
GLUCOSE, UA: NEGATIVE mg/dL
HGB URINE DIPSTICK: NEGATIVE
Ketones, ur: NEGATIVE mg/dL
Leukocytes, UA: NEGATIVE
Nitrite: NEGATIVE
PH: 6 (ref 5.0–8.0)
Protein, ur: NEGATIVE mg/dL
SPECIFIC GRAVITY, URINE: 1.004 — AB (ref 1.005–1.030)
Urobilinogen, UA: 0.2 mg/dL (ref 0.0–1.0)

## 2014-01-13 LAB — SODIUM, URINE, RANDOM: Sodium, Ur: <20 mEq/L

## 2014-01-13 LAB — TSH: TSH: 6.471 u[IU]/mL — ABNORMAL HIGH (ref 0.350–4.500)

## 2014-01-13 LAB — SODIUM: Sodium: 129 mEq/L — ABNORMAL LOW (ref 137–147)

## 2014-01-13 LAB — OSMOLALITY, URINE: Osmolality, Ur: 95 mOsm/kg — ABNORMAL LOW (ref 390–1090)

## 2014-01-13 MED ORDER — GABAPENTIN 100 MG PO CAPS
100.0000 mg | ORAL_CAPSULE | Freq: Every evening | ORAL | Status: DC | PRN
  Filled 2014-01-13: qty 3

## 2014-01-13 MED ORDER — PROCHLORPERAZINE MALEATE 10 MG PO TABS: 10.0000 mg | ORAL_TABLET | Freq: Four times a day (QID) | ORAL | Status: DC | PRN

## 2014-01-13 MED ORDER — LEVOTHYROXINE SODIUM 75 MCG PO TABS
75.0000 ug | ORAL_TABLET | Freq: Every day | ORAL | Status: DC
  Administered 2014-01-13 – 2014-01-28 (×15): 75 ug via ORAL
  Filled 2014-01-13 (×19): qty 1

## 2014-01-13 MED ORDER — SODIUM CHLORIDE 0.9 % IV SOLN
INTRAVENOUS | Status: DC
  Administered 2014-01-14 – 2014-01-22 (×9): via INTRAVENOUS
  Administered 2014-01-23 – 2014-01-24 (×3): 1000 mL via INTRAVENOUS
  Administered 2014-01-27 – 2014-01-28 (×2): via INTRAVENOUS

## 2014-01-13 MED ORDER — VITAMINS A & D EX OINT
TOPICAL_OINTMENT | CUTANEOUS | Status: AC
  Filled 2014-01-13: qty 5

## 2014-01-13 MED ORDER — DEXAMETHASONE SODIUM PHOSPHATE 10 MG/ML IJ SOLN
10.0000 mg | Freq: Once | INTRAMUSCULAR | Status: AC
  Administered 2014-01-13: 10 mg via INTRAVENOUS
  Filled 2014-01-13: qty 1

## 2014-01-13 MED ORDER — TRAZODONE 25 MG HALF TABLET
25.0000 mg | ORAL_TABLET | Freq: Every day | ORAL | Status: DC
  Administered 2014-01-13 – 2014-01-19 (×7): 25 mg via ORAL
  Filled 2014-01-13 (×9): qty 1

## 2014-01-13 MED ORDER — SODIUM CHLORIDE 0.9 % IV BOLUS (SEPSIS)
1000.0000 mL | Freq: Once | INTRAVENOUS | Status: AC
  Administered 2014-01-13: 1000 mL via INTRAVENOUS

## 2014-01-13 MED ORDER — DEXAMETHASONE 4 MG PO TABS
4.0000 mg | ORAL_TABLET | Freq: Two times a day (BID) | ORAL | Status: DC
  Administered 2014-01-20: 4 mg via ORAL
  Filled 2014-01-13 (×17): qty 1

## 2014-01-13 MED ORDER — B COMPLEX-C PO TABS
1.0000 | ORAL_TABLET | Freq: Every day | ORAL | Status: DC
  Administered 2014-01-13 – 2014-01-28 (×16): 1 via ORAL
  Filled 2014-01-13 (×17): qty 1

## 2014-01-13 MED ORDER — ALPRAZOLAM 0.25 MG PO TABS: 0.2500 mg | ORAL_TABLET | Freq: Every evening | ORAL | Status: DC | PRN

## 2014-01-13 MED ORDER — SODIUM CHLORIDE 0.9 % IV SOLN
INTRAVENOUS | Status: AC
  Administered 2014-01-13: 19:00:00 via INTRAVENOUS

## 2014-01-13 MED ORDER — ADULT MULTIVITAMIN W/MINERALS CH
1.0000 | ORAL_TABLET | Freq: Every day | ORAL | Status: DC
  Administered 2014-01-13 – 2014-01-28 (×16): 1 via ORAL
  Filled 2014-01-13 (×16): qty 1

## 2014-01-13 NOTE — Progress Notes (Signed)
01/13/14 [facility] for report. Megan unable to come to phone at this time.

## 2014-01-13 NOTE — H&P (Signed)
History and Physical    [name]:[id number] DOB: 1951/04/15 DOA: 01/13/2014  Referring physician: Harvie Heck PCP: Melrose Nakayama, MD  Specialists: Oncology, Dr. Earlie Server  Chief Complaint: Weakness, inability to walk, poor by mouth intake  HPI: Debbie Larsen is a 63 y.o. 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 She denies any fever or chills, has mild nausea, denies vomiting. She has no diarrhea. She has no appetite and is unable to eat. Denies any falls at home. In the ED, patient with mild anemia, thrombocytopenia and significant hyponatremia to 124 from previous normal values.   Review of Systems: as per HPI otherwise negative.   Past Medical History  Diagnosis Date  . 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 Hypothyroid   . Enlarged lymph nodes   . BREAST CANCER 04/2001    s/p radiation therapy, chemotherapy and lumpectomy  . 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  . S/P radiation therapy     Right breast/regional lymph nodes/ 5040 cGy / 28 fractions/ bost of 1200 cGy/ 6 Fractions  . Status post chemotherapy     6 cycles of CMF completed on 12/12/2001  . Right shoulder pain     started late 2012  . Dry cough     intermittent  . SOB (shortness of breath)   . Use of letrozole (Femara) 03/14/2013  . Metastasis to bone 03/13/13    PET scan results  . Metastasis to bone     breast primary  . Cholelithiasis   . Anemia   . Cancer     brain mets  . Stroke   . Lung cancer 10/08/2013  . Secondary malignant neoplasm of brain and spinal cord  12/23/2013   Past Surgical History  Procedure Laterality Date  . Tonsillectomy    . Breast lumpectomy  04/2001    excision of L breast fibroadenoma by Dr.   . Breast lumpectomy  05/16/2001    Right axillary sentinel lymph node biopsy.  . Portacath placement  09/19/2012    Procedure: INSERTION PORT-A-CATH;  Surgeon: Haywood Lasso, MD;  Location: [facility];  Service: General;  Laterality: N/A;  . Lipoma excision Right 05/15/2013    Procedure: RIGHT AXILLARY LYMPH NODE DISSECTION;  Surgeon: Haywood Lasso, MD;  Location: [facility];  Service: General;  Laterality: Right;   Social History:  reports that she has never smoked. She has never used smokeless tobacco. She reports that she does not drink alcohol or use illicit drugs.  No Known Allergies  Family History  Problem Relation Age of Onset  . Diabetes Mother   . Hypertension Father   . Hypertension Sister   . Hypothyroidism Brother   . Cancer Neg Hx    Prior to Admission medications   Medication Sig Start Date End Date Taking? Authorizing Provider  afatinib dimaleate (GILOTRIF) 30 MG tablet Take 1 tablet (30 mg total) by mouth daily. Take on an empty stomach 1hr before or 2hrs after meals. 10/08/13  Yes Curt Bears, MD  B Complex-C (B-COMPLEX WITH VITAMIN C) tablet Take 1 tablet by  mouth daily.   Yes Historical Provider, MD  dexamethasone (DECADRON) 4 MG tablet Take 1 tablet (4 mg total) by mouth 2 (two) times daily. 12/20/13  Yes Curt Bears, MD  gabapentin (NEURONTIN) 100 MG capsule Take one to 3 tablets every night for right arm pain 12/06/13  Yes Marcial Pacas, MD  levothyroxine (SYNTHROID, LEVOTHROID) 75 MCG tablet Take 1 tablet (75 mcg total) by mouth every evening. 11/22/13  Yes Andrena Mews, MD  lidocaine-prilocaine (EMLA) cream Apply topically as needed. Apply to port 1-2 hours before procedure 09/18/12  Yes Eston Esters, MD  Multiple Vitamin (MULTIVITAMIN WITH MINERALS) TABS Take 1 tablet by mouth daily.   Yes Historical  Provider, MD  prochlorperazine (COMPAZINE) 10 MG tablet Take 1 tablet (10 mg total) by mouth every 6 (six) hours as needed for nausea or vomiting. 12/31/13  Yes Rexene Edison, MD   Physical Exam: Filed Vitals:   01/13/14 1109 01/13/14 1115 01/13/14 1312  BP:  134/70 146/78  Pulse:  88 77  Temp:  98.5 F (36.9 C)   TempSrc:  Oral   Resp:  16 18  Height:  5' (1.524 m)   Weight:  48.535 kg (107 lb)   SpO2: 98% 100% 100%    General:  No apparent distress, cachectic and frail appearing female  Eyes: PERRL, EOMI, no scleral icterus  ENT: dry oropharinx  Neck: supple, no JVD  Cardiovascular: regular rate without MRG; 2+ peripheral pulses  Respiratory: mild wheezing bilaterally  Abdomen: soft, non tender to palpation, positive bowel sounds, no guarding, no rebound  Skin: no rashes  Musculoskeletal: no peripheral edema  Psychiatric: anxious  Neurologic: bilateral LE weakness, left 4/5, right 5/5  Labs on Admission:  Basic Metabolic Panel:  Recent Labs Lab 01/13/14 1134  NA 124*  K 3.7  CL 86*  CO2 22  GLUCOSE 111*  BUN 7  CREATININE 0.60  CALCIUM 9.1   Liver Function Tests:  Recent Labs Lab 01/13/14 1134  AST 70*  ALT 77*  ALKPHOS 187*  BILITOT 0.5  PROT 7.2  ALBUMIN 3.7   CBC:  Recent Labs Lab 01/13/14 1134  WBC 4.9  NEUTROABS 4.0  HGB 10.9*  HCT 30.9*  MCV 76.9*  PLT 81*   Radiological Exams on Admission: No results found.  Assessment/Plan Active Problems:   BREAST CANCER, right, IDC, Stage II, recepto +   HYPOTHYROIDISM, UNSPECIFIED   Generalized anxiety disorder   Anemia   Metastasis to bone   Carcinoma of unknown origin   Lung cancer   Secondary malignant neoplasm of brain and spinal cord   Hyponatremia   Weakness   Elevated liver enzymes   Liver metastasis   Brain metastases   Bone metastasis    Metastatic carcinoma of undetermined primary with metastasis to liver, bone - with new diagnosed brain metastasis, just  finished radiation therapy 5 days ago. Dr. Julien Nordmann aware of patient's admission. Consider repeat MRI, wanted to order it today however patient refusing and will consider it tomorrow.  - will obtain CT chest abdomen pelvis for restaging, patient refusing to have it done today, will order it for tomorrow morning.  Weakness - likely due to her underlying disease/brain mets. Continue decadron. - PT consult.  Hyponatremia - she is clinically dehydrated, will provide IVF, but suspect SIADH component as well. Will recheck Na every 6 h, obtain urine osmolality, sodium, serum osm.  Hypothyroidism - continue synthroid. Recheck TSH Anemia / Thrombocytopenia - due to underlying disease Elevated liver enzymes -  likely due to metastatic disease, will obtain restaging.  FTT / Malnutrition / weight loss - nutrition consult   Diet: regular Fluids: NS DVT Prophylaxis: SCDs  Code Status: Full  Family Communication: none  Disposition Plan: inpatient  Time spent: 42  This note has been created with Surveyor, quantity. Any transcriptional errors are unintentional.   Costin M. Cruzita Lederer, MD [facility] Pager [phone]  If 7PM-7AM, please contact night-coverage [email or web address] Password [email or web address] 01/13/2014, 2:26 PM

## 2014-01-13 NOTE — ED Notes (Signed)
Pt refusing to allow phlebotomy to draw blood except from port.

## 2014-01-13 NOTE — ED Notes (Signed)
Pt complains of bilateral legs being too weak to walk.  Pt normally walks with walker but today tried to get out of bed and couldn't walk.  Pt has hx ca to brain, lungs

## 2014-01-13 NOTE — ED Notes (Signed)
Pt requesting fluids or something to drink so that she can urinate.  Pt aware of need for urine specimen.

## 2014-01-13 NOTE — ED Notes (Signed)
Pt reports went for radiation last week and then today went to get out of bed with walker and legs just gave out.  Pt continues to repeat "i can't stay by myself or walk and I need to be admitted."  Pt denies headache, chest pain, c/o nausea.

## 2014-01-13 NOTE — ED Notes (Signed)
Bed: HO12 Expected date: 01/13/14 Expected time:  Means of arrival:  Comments: EMS cancer pt

## 2014-01-13 NOTE — Progress Notes (Signed)
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  2) history of right breast adenocarcinoma with positive ER/PR diagnosed in July of 2002.  MOLECULAR BIOMARKERS: The metastatic high-grade carcinoma diagnosed in October of 2013 was negative for ER, PR and HER-2. Next generation sequencing showed the tumor is positive for EGFR mutation [id number] in exon 19, SMARCB1, PIK3CA and HRAS>  PRIOR THERAPY:  1) status post right lumpectomy in July of 2002.  2) status post systemic chemotherapy with CMF for a total of 6 cycles completed 12/12/2001.  3) status post radiotherapy to the right breast and regional lymph nodes for a total dose of 5040 cGY with a boost of 1200 cGY completed in February 12, 2002.  4) the patient presented with right axillary lymphadenopathy in November of 2013 and excisional biopsy showed high grade carcinoma of unknown primary. PET scan at that time showed the right axillary lymphadenopathy in addition to bilateral mediastinum and left lung nodules.  5) She was treated as breast cancer with Abraxane until April of 2014. Repeat PET scan on 03/13/2013 showed evidence for disease progression.  6) Abraxane 100 mg/M2 on days 1 and 8 every 3 weeks, status post 9 cycles.  CURRENT THERAPY: Gilotrif 30 mg by mouth daily, started 10/23/2013. She stoped this treatment last week  Subjective: The patient is seen and examined today.she was admitted earlier today with significant generalized weakness, inability to walk as well as lack of appetite and poor by mouth intake. Her weakness is more in the lower extremities. The patient was recently diagnosed with multiple brain metastasis and she completed a course of whole brain irradiation under the care of Dr. Valere Dross. She tolerated her brain radiation fairly well. She has lack of taste and  malnutrition. She has been on treatment with Gilotrif but discontinued this treatment few days ago because of weakness and fatigue and change in her taste. She was supposed to have repeat CT scan of the chest abdomen and pelvis 2 weeks ago for evaluation of her disease but the patient did not show up for her appointment and was awaiting the approval of a PET scan which was denied several times by her insurance.  Objective: Vital signs in last 24 hours: Temp:  [98.1 F (36.7 C)-98.5 F (36.9 C)] 98.1 F (36.7 C) (03/16 1550) Pulse Rate:  [67-88] 70 (03/16 1550) Resp:  [16-19] 18 (03/16 1550) BP: (120-146)/(70-78) 120/70 mmHg (03/16 1550) SpO2:  [98 %-100 %] 100 % (03/16 1550) Weight:  [107 lb (48.535 kg)] 107 lb (48.535 kg) (03/16 1550)  Intake/Output from previous day:   Intake/Output this shift:    General appearance: alert, cooperative, fatigued and no distress Resp: clear to auscultation bilaterally Cardio: regular rate and rhythm, S1, S2 normal, no murmur, click, rub or gallop GI: soft, non-tender; bowel sounds normal; no masses,  no organomegaly Extremities: extremities normal, atraumatic, no cyanosis or edema  Lab Results:   Recent Labs  01/13/14 1134  WBC 4.9  HGB 10.9*  HCT 30.9*  PLT 81*   BMET  Recent Labs  01/13/14 1134 01/13/14 1745  NA 124* 129*  K 3.7  --   CL 86*  --   CO2 22  --   GLUCOSE 111*  --   BUN 7  --   CREATININE 0.60  --   CALCIUM 9.1  --  Studies/Results: No results found.  Medications: I have reviewed the patient's current medications.  Assessment/Plan: 1) history of breast carcinoma diagnosed in 2002. 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 She was recently treated with oral EGFR tyrosine kinase inhibitor, Gilotrif status post 2 months of treatment. I recommended for the patient to have repeat CT scan of the chest, abdomen and  pelvis for restaging of her disease. She is not interested in considering treatment with Gilotrif because of intolerance. I will discuss with her other treatment options based on the CT scan results. 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 4) generalized weakness and fatigue: secondary to new metastatic brain lesions as well as metastatic carcinoma. Continue supportive care and PT/OT. 5) protein calorie Malnutrition: consider nutritional consult. Thank you so much for taking good care of Ms. Liebert, I will continue to follow the patient with you and assist in her management an as-needed basis.  LOS: 0 days    Krissie Merrick K. 01/13/2014

## 2014-01-13 NOTE — ED Provider Notes (Signed)
CSN: [id number]     Arrival date & time 01/13/14  1109 History   First MD Initiated Contact with Patient 01/13/14 1114     Chief Complaint  Patient presents with  . Weakness     (Consider location/radiation/quality/duration/timing/severity/associated sxs/prior Treatment) HPI Comments: Debbie Larsen is a 63 y.o. female with a past medical history of Metastatic carcinoma of undetermined primary origin to brain, recent radiation therapy 4 days ago, presenting the Emergency Department with a chief complaint of lower extremity weakness.  She reports equal bilateral leg weakness since last night.  She reports she is normally able to ambulate with a walker but yesterday was unable to walk due to weakens.  She reports numbness and tingling to lower extremities for several weeks.  She reports non-compliance with home dexamethasone for 3 days. She reports living alone, family in [place]. Pt is a Full Code and is not on hospice care. Oncologist: Curt Bears PCP: [place], [place] Oncologist: Dr. Arloa Koh     Patient is a 63 y.o. female presenting with weakness. The history is provided by the patient and medical records. No language interpreter was used.  Weakness Associated symptoms include numbness and weakness. Pertinent negatives include no abdominal pain, chest pain, chills, fever, nausea or vomiting.    Past Medical History  Diagnosis Date  . 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 Hypothyroid   . Enlarged lymph nodes   . BREAST CANCER 04/2001    s/p radiation therapy, chemotherapy and lumpectomy  . 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  . S/P radiation therapy     Right breast/regional lymph nodes/ 5040 cGy / 28 fractions/ bost of 1200 cGy/ 6 Fractions  . Status post chemotherapy     6 cycles of CMF completed on 12/12/2001  . Right shoulder pain     started late 2012  . Dry cough    intermittent  . SOB (shortness of breath)   . Use of letrozole (Femara) 03/14/2013  . Metastasis to bone 03/13/13    PET scan results  . Metastasis to bone     breast primary  . Cholelithiasis   . Anemia   . Cancer     brain mets  . Stroke   . Lung cancer 10/08/2013  . 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 Tonsillectomy    . Breast lumpectomy  04/2001    excision of L breast fibroadenoma by Dr.   . Breast lumpectomy  05/16/2001    Right axillary sentinel lymph node biopsy.  . Portacath placement  09/19/2012    Procedure: INSERTION PORT-A-CATH;  Surgeon: Haywood Lasso, MD;  Location: [facility];  Service: General;  Laterality: N/A;  . Lipoma excision Right 05/15/2013    Procedure: RIGHT AXILLARY LYMPH NODE DISSECTION;  Surgeon: Haywood Lasso, MD;  Location: [facility];  Service: General;  Laterality: Right;   Family History  Problem Relation Age of Onset  . Diabetes Mother   . Hypertension Father   . Hypertension Sister   . Hypothyroidism Brother   . Cancer Neg Hx    History  Substance Use Topics  . Smoking status: Never Smoker   . Smokeless tobacco: Never Used  . Alcohol Use: No   OB History   Grav Para Term Preterm Abortions TAB SAB Ect Mult Living   1 1  1       1     Obstetric Comments   G1, P1 Parity 28, Menopause with chemotherapy. No HRT use     Review of Systems  Constitutional: Negative for fever and chills.  Respiratory: Negative for shortness of breath.   Cardiovascular: Negative for chest pain.  Gastrointestinal: Negative for nausea, vomiting, abdominal pain and diarrhea.  Genitourinary: Positive for urgency. Negative for dysuria and hematuria.  Musculoskeletal: Positive for gait problem.  Neurological: Positive for weakness and numbness. Negative for dizziness and light-headedness.      Allergies  Review of patient's allergies indicates no known allergies.  Home  Medications   Current Outpatient Rx  Name  Route  Sig  Dispense  Refill  . afatinib dimaleate (GILOTRIF) 30 MG tablet   Oral   Take 1 tablet (30 mg total) by mouth daily. Take on an empty stomach 1hr before or 2hrs after meals.   30 tablet   2   . B Complex-C (B-COMPLEX WITH VITAMIN C) tablet   Oral   Take 1 tablet by mouth daily.         Marland Kitchen dexamethasone (DECADRON) 4 MG tablet   Oral   Take 1 tablet (4 mg total) by mouth 2 (two) times daily.   60 tablet   0   . gabapentin (NEURONTIN) 100 MG capsule      Take one to 3 tablets every night for right arm pain   90 capsule   12   . levothyroxine (SYNTHROID, LEVOTHROID) 75 MCG tablet   Oral   Take 1 tablet (75 mcg total) by mouth every evening.   30 tablet   3   . lidocaine-prilocaine (EMLA) cream   Topical   Apply topically as needed. Apply to port 1-2 hours before procedure   30 g   0   . Multiple Vitamin (MULTIVITAMIN WITH MINERALS) TABS   Oral   Take 1 tablet by mouth daily.         . prochlorperazine (COMPAZINE) 10 MG tablet   Oral   Take 1 tablet (10 mg total) by mouth every 6 (six) hours as needed for nausea or vomiting.   30 tablet   0    BP 146/78  Pulse 77  Temp(Src) 98.5 F (36.9 C) (Oral)  Resp 18  Ht 5' (1.524 m)  Wt 107 lb (48.535 kg)  BMI 20.90 kg/m2  SpO2 100% Physical Exam  Nursing note and vitals reviewed. Constitutional: She is oriented to person, place, and time. She appears well-developed. No distress.  Frail female appears older than stated age.  HENT:  Head: Normocephalic and atraumatic.  Eyes: EOM are normal. No scleral icterus.  Neck: Normal range of motion. Neck supple.  Cardiovascular: Normal rate and regular rhythm.   Pulses:      Dorsalis pedis pulses are 2+ on the right side, and 2+ on the left side.  No lower extremity edema  Pulmonary/Chest: Effort normal. She has no decreased breath sounds. She has no wheezes. She has no rales.  Port Left upper chest non tender no  surrounding erythema.   Abdominal: Soft. She exhibits no distension. There is no tenderness. There is no rebound and no guarding.  Musculoskeletal:  Small superficial abrasion to left knee and foot.  Neurological: She is alert and oriented to person, place, and time. No sensory deficit.  Reflex Scores:      Patellar reflexes are 1+ on the right side and 1+ on the left side.  Moves all 4 extremities.  Lower extremities pt is to hold up against gravity. No decreased sensation to light touch.  Good cap refill  Skin: Skin is warm and dry.  Psychiatric: She has a normal mood and affect. Her behavior is normal.    ED Course  Procedures (including critical care time) Labs Review Labs Reviewed  CBC WITH DIFFERENTIAL - Abnormal; Notable for the following:    Hemoglobin 10.9 (*)    HCT 30.9 (*)    MCV 76.9 (*)    Platelets 81 (*)    Neutrophils Relative % 81 (*)    Lymphocytes Relative 10 (*)    Lymphs Abs 0.5 (*)    All other components within normal limits  COMPREHENSIVE METABOLIC PANEL - Abnormal; Notable for the following:    Sodium 124 (*)    Chloride 86 (*)    Glucose, Bld 111 (*)    AST 70 (*)    ALT 77 (*)    Alkaline Phosphatase 187 (*)    All other components within normal limits  URINALYSIS, ROUTINE W REFLEX MICROSCOPIC - Abnormal; Notable for the following:    Specific Gravity, Urine 1.004 (*)    All other components within normal limits   Imaging Review No results found.   EKG Interpretation None      MDM   Final diagnoses:  Hyponatremia  Weakness  History of cancer  Thrombocytopenia  Urinary retention   Metastatic carcinoma to brain, most recent radiation 01/09/2014 (Whole brain 3000 cGy 10 sessions), presenting with bilateral lower extremity weakness presenting with weakness.  She also reports non-compliance of her dexamethasone for the past 3 days, EMR shows prescription for dexamethasone 4mg  BID 12/20/2013 by Dr. Julien Nordmann. Na 124, 3 weeks ago 134. PLT 81,  3 weeks ago 217.   Port accessed, IV fluids given. Discussed lab findings and admission for hyponatremia and urinary retention.  1350: Discussed Pt historyand condition with Dr. Cruzita Lederer, who will admit the patient for further treatment of her hyponatremia.   Lorrine Kin, PA-C 01/14/14 [phone]

## 2014-01-14 ENCOUNTER — Encounter (HOSPITAL_COMMUNITY): Payer: Self-pay | Admitting: Radiology

## 2014-01-14 ENCOUNTER — Observation Stay (HOSPITAL_COMMUNITY): Payer: Medicaid Other

## 2014-01-14 DIAGNOSIS — C7951 Secondary malignant neoplasm of bone: Secondary | ICD-10-CM

## 2014-01-14 DIAGNOSIS — D649 Anemia, unspecified: Secondary | ICD-10-CM

## 2014-01-14 DIAGNOSIS — R5381 Other malaise: Secondary | ICD-10-CM

## 2014-01-14 DIAGNOSIS — C7952 Secondary malignant neoplasm of bone marrow: Secondary | ICD-10-CM

## 2014-01-14 DIAGNOSIS — E43 Unspecified severe protein-calorie malnutrition: Secondary | ICD-10-CM | POA: Diagnosis present

## 2014-01-14 LAB — MAGNESIUM: Magnesium: 2 mg/dL (ref 1.5–2.5)

## 2014-01-14 LAB — COMPREHENSIVE METABOLIC PANEL
ALBUMIN: 3 g/dL — AB (ref 3.5–5.2)
ALK PHOS: 152 U/L — AB (ref 39–117)
ALT: 65 U/L — AB (ref 0–35)
AST: 60 U/L — ABNORMAL HIGH (ref 0–37)
BUN: 6 mg/dL (ref 6–23)
CO2: 20 mEq/L (ref 19–32)
Calcium: 8.2 mg/dL — ABNORMAL LOW (ref 8.4–10.5)
Chloride: 95 mEq/L — ABNORMAL LOW (ref 96–112)
Creatinine, Ser: 0.59 mg/dL (ref 0.50–1.10)
GFR calc Af Amer: >90 mL/min (ref >90)
GFR calc non Af Amer: >90 mL/min (ref >90)
Glucose, Bld: 115 mg/dL — ABNORMAL HIGH (ref 70–99)
POTASSIUM: 3.7 meq/L (ref 3.7–5.3)
SODIUM: 130 meq/L — AB (ref 137–147)
Total Bilirubin: 0.3 mg/dL (ref 0.3–1.2)
Total Protein: 5.9 g/dL — ABNORMAL LOW (ref 6.0–8.3)

## 2014-01-14 LAB — CBC
HCT: 25.1 % — ABNORMAL LOW (ref 36.0–46.0)
Hemoglobin: 8.9 g/dL — ABNORMAL LOW (ref 12.0–15.0)
MCH: 27.6 pg (ref 26.0–34.0)
MCHC: 35.5 g/dL (ref 30.0–36.0)
MCV: 77.7 fL — AB (ref 78.0–100.0)
Platelets: 82 10*3/uL — ABNORMAL LOW (ref 150–400)
RBC: 3.23 MIL/uL — ABNORMAL LOW (ref 3.87–5.11)
RDW: 15.5 % (ref 11.5–15.5)
WBC: 5 10*3/uL (ref 4.0–10.5)

## 2014-01-14 LAB — PHOSPHORUS: Phosphorus: 3.1 mg/dL (ref 2.3–4.6)

## 2014-01-14 LAB — SODIUM
Sodium: 129 mEq/L — ABNORMAL LOW (ref 137–147)
Sodium: 127 mEq/L — ABNORMAL LOW (ref 137–147)

## 2014-01-14 LAB — OSMOLALITY: OSMOLALITY: 257 mosm/kg — AB (ref 275–300)

## 2014-01-14 MED ORDER — IOHEXOL 300 MG/ML  SOLN
25.0000 mL | INTRAMUSCULAR | Status: AC
  Administered 2014-01-14 (×2): 25 mL via ORAL

## 2014-01-14 MED ORDER — RESOURCE INSTANT PROTEIN PO PWD PACKET
1.0000 | Freq: Three times a day (TID) | ORAL | Status: DC
  Administered 2014-01-14: 6 g via ORAL
  Filled 2014-01-14: qty 227

## 2014-01-14 MED ORDER — IOHEXOL 300 MG/ML  SOLN
100.0000 mL | Freq: Once | INTRAMUSCULAR | Status: AC | PRN
  Administered 2014-01-14: 100 mL via INTRAVENOUS

## 2014-01-14 MED ORDER — VITAMINS A & D EX OINT
TOPICAL_OINTMENT | CUTANEOUS | Status: AC
  Administered 2014-01-14: 12:00:00
  Filled 2014-01-14: qty 5

## 2014-01-14 NOTE — Progress Notes (Signed)
Chaplain consulted with RN. Chaplain offered ministry of presence. Patient's friend at bedside. Patient asked chaplain to come back another time. Chaplain will follow up.   01/14/14 1500  Clinical Encounter Type  Visited With Patient;Health care provider  Visit Type Initial

## 2014-01-14 NOTE — Progress Notes (Addendum)
[facility] PROGRESS NOTE  Debbie Larsen IFO:[id number] DOB: 01/08/1951 DOA: 01/13/2014 PCP: Melrose Nakayama, MD  Brief narrative: 63 y.o. 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 Repeat PET scan on 03/13/2013 showed evidence for disease progression. Pt was admitted 01/13/2014 for progressive weakness, poor oral intake, weight loss.  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 Earlie Server, treatment depends on the findings of CT scan. - Patient is on dexamethasone 4 mg by mouth twice a day for brain metastasis Hyponatremia - Dehydration versus SIADH from malignancy  - Sodium ranges 127 to 130 - continue IV fluids Hypothyroidism  - continue synthroid Anemia / Thrombocytopenia  - Secondary to chronic disease secondary to malignancy and sequela of chemotherapy  - Hemoglobin 8.9 and platelet count 82 this morning - Continue to monitor for signs of bleed Elevated liver enzymes  - likely due to metastatic disease Severe protein calorie malnutrition  -  nutrition consulted - continue protein supplements   Code Status: full code Family Communication: no family at the bedside  Disposition Plan: home when stable  Debbie Lenz, MD  [facility] Pager [phone]  If 7PM-7AM, please contact night-coverage [email or web address] Password [email or web address] 01/14/2014, 1:51 PM   LOS: 1 day   Consultants:  Oncology (Dr. Julien Nordmann)  Procedures:  None   Antibiotics:  None   HPI/Subjective: No acute overnight events.   Objective: Filed Vitals:   01/13/14 1502 01/13/14 1550 01/13/14 2100 01/14/14 0610  BP: 132/71 120/70 110/70 120/80  Pulse:  70 95 93  Temp:  98.1 F (36.7 C) 98.6 F (37 C) 98.5 F (36.9 C)  TempSrc:  Oral Oral Oral  Resp:  18 16 16   Height:  5' (1.524 m)    Weight:  48.535 kg (107 lb)  47.764 kg (105 lb 4.8 oz)  SpO2:  100% 97% 100%    Intake/Output Summary (Last 24 hours) at 01/14/14 1351 Last data filed at 01/14/14 0617  Gross per 24 hour  Intake      0 ml  Output   4050 ml  Net  -4050 ml    Exam:   General:  Pt is alert, follows commands appropriately, not in acute distress  Cardiovascular: Regular rate and rhythm, S1/S2 appreciated   Respiratory: diminished breath sounds but no wheezing or rhonchi   Abdomen: Soft, non tender, non distended, bowel sounds present, no guarding  Extremities: No edema, pulses DP and PT palpable bilaterally  Neuro: B/L LE weakness  Data Reviewed: Basic Metabolic Panel:  Recent Labs Lab 01/13/14 1134 01/13/14 1745 01/14/14 0115 01/14/14 0631 01/14/14 1109  NA 124* 129* 129* 130* 127*  K 3.7  --   --  3.7  --  CL 86*  --   --  95*  --   CO2 22  --   --  20  --   GLUCOSE 111*  --   --  115*  --   BUN 7  --   --  6  --   CREATININE 0.60  --   --  0.59  --   CALCIUM 9.1  --   --  8.2*  --   MG  --   --   --  2.0  --   PHOS  --   --   --  3.1  --    Liver Function Tests:  Recent Labs Lab 01/13/14 1134 01/14/14 0631  AST 70* 60*  ALT 77* 65*  ALKPHOS 187*  152*  BILITOT 0.5 0.3  PROT 7.2 5.9*  ALBUMIN 3.7 3.0*   No results found for this basename: LIPASE, AMYLASE,  in the last 168 hours No results found for this basename: AMMONIA,  in the last 168 hours CBC:  Recent Labs Lab 01/13/14 1134 01/14/14 0631  WBC 4.9 5.0  NEUTROABS 4.0  --   HGB 10.9* 8.9*  HCT 30.9* 25.1*  MCV 76.9* 77.7*  PLT 81* 82*   Cardiac Enzymes: No results found for this basename: CKTOTAL, CKMB, CKMBINDEX, TROPONINI,  in the last 168 hours BNP: No components found with this basename: POCBNP,  CBG: No results found for this basename: GLUCAP,  in the last 168 hours  No results found for this or any previous visit (from the past 240 hour(s)).   Studies: Ct Chest W Contrast 01/14/2014    IMPRESSION: 1. Progression of metastatic disease within the mediastinum and lung parenchyma. There been increased size of the lung nodules and increased numbers. Interstitial findings within the lungs which may reflect the sequela of lymph and take infiltration. Chronic interstitial fibrotic change is of diagnostic consideration. 2. Interval increase  burden of metastatic disease within the liver. 3. Diffuse metastatic disease throughout the appendicular and axial skeleton 4. Findings concerning for a soft tissue mass within the pelvis on the left which may reflect a metastatic disease or possibly an ovarian origin. 5. Re evaluation with total body PET-CT is recommended.     Ct Abdomen Pelvis W Contrast 01/14/2014   IMPRESSION: 1. Progression of metastatic disease within the mediastinum and lung parenchyma. There been increased size of the lung nodules and increased numbers. Interstitial findings within the lungs which may reflect the sequela of lymph and take infiltration. Chronic interstitial fibrotic change is of diagnostic consideration. 2. Interval increase  burden of metastatic disease within the liver. 3. Diffuse metastatic disease throughout the appendicular and axial skeleton 4.  Findings concerning for a soft tissue mass within the pelvis on the left which may reflect a metastatic disease or possibly an ovarian origin. 5. Re evaluation with total body PET-CT is recommended.     Scheduled Meds: . B-complex with vitamin C  1 tablet Oral Daily  . dexamethasone  4 mg Oral BID  . levothyroxine  75 mcg Oral QAC breakfast  . multivitamin   1 tablet Oral Daily  . protein supplement  1 scoop Oral TID WC  . traZODone  25 mg Oral QHS

## 2014-01-14 NOTE — Care Management Note (Signed)
   CARE MANAGEMENT NOTE 01/14/2014  Patient:  YUKI, BRUNSMAN   Account Number:  [id number]  Date Initiated:  01/14/2014  Documentation initiated by:  Reiley Keisler  Subjective/Objective Assessment:   63 yo female admitted with hyponatremia. Hx of  Metastatic carcinoma of undetermined primary with metastasis to liver, bone     Action/Plan:   Dipsosition pending   Anticipated DC Date:     Anticipated DC Plan:        DC Planning Services  CM consult      Choice offered to / List presented to:  NA   DME arranged  NA      DME agency  NA     [place] arranged  NA      [place] agency  NA   Status of service:  In process, will continue to follow Medicare Important Message given?   (If response is "NO", the following Medicare IM given date fields will be blank) Date Medicare IM given:   Date Additional Medicare IM given:    Discharge Disposition:    Per UR Regulation:  Reviewed for med. necessity/level of care/duration of stay  If discussed at [place] of Stay Meetings, dates discussed:    Comments:  01/14/14 [place] Chart reviewed for utilizationof services.CM to follow for potential dc needs.

## 2014-01-14 NOTE — Progress Notes (Signed)
INITIAL NUTRITION ASSESSMENT  DOCUMENTATION CODES Per approved criteria  -Severe malnutrition in the context of chronic illness  Pt meets criteria for severe MALNUTRITION in the context of chronic illnes as evidenced by 13% weight loss in <3 month period, PO intake <75% for > one month.  INTERVENTION: -Recommend Beneprotein TID -Recommend MagicCup supplement TID -Provided pt with nutrition handouts regarding loss of taste and appetite -Provided pt with mints/lemon drops to improve taste alterations -Encouraged PO intake/assisted with meal ordering -Contacted Patient Risk analyst to provide pt with milkshake once daily  NUTRITION DIAGNOSIS: Inadequate oral intake related to decreased appetite/loss of taste/nausea as evidenced by PO intake <75%, wt loss.   Goal: Pt to meet >/= 90% of their estimated nutrition needs    Monitor:  Total protein/energy intake, labs, weights, GI profile  Reason for Assessment: Consult/MST  63 y.o. female  Admitting Dx: <principal problem not specified>  ASSESSMENT: Debbie Larsen is a 63 y.o. 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 She denies any fever or chills, has mild nausea, denies vomiting  -Pt reported an unintentional wt loss of 15 lbs in past 2 months since beginning chemo and radiation treatments d/t decreased appetite, nausea and loss of taste -Has been on liquid/soft food diet for past 10 days, has experienced 5-6 lbs weight loss -Diet recall indicated pt consuming tea/milk for breakfast, and cream based soups or fruit smoothies for lunch/dinner. -Has been trying to incorporate high  protein foods into diet- kefir, greek yogurt, egg proteins. Contacted Patient Risk analyst for pt to receive milkshake once daily -Pt dislikes Ensure/Boost supplements, was willing to try MagicCup -Has had nausea and difficulty swallowing solid foods- noted difficulty with oatmeal getting stuck in throat -Encouraged pt to use mint/lemon drops and cold foods to assist with taste changes -Provided pt with handouts for other nutrition therapy recommendations. Reviewed full liquid diet options and possible ways to increase kcal and protein intake. Recommend use of Beneprotein with meals to increase food's nutrient density -Recommend pt be advanced to a mechanical soft diet as tolerated -Pt last radiation treatment was 5 days ago  Height: Ht Readings from Last 1 Encounters:  01/13/14 5' (1.524 m)    Weight: Wt Readings from Last 1 Encounters:  01/14/14 105 lb 4.8 oz (47.764 kg)    Ideal Body Weight: 100 lbs  % Ideal Body Weight: 105%  Wt Readings from Last 10 Encounters:  01/14/14 105 lb 4.8 oz (47.764 kg)  01/06/14 107 lb 1.6 oz (48.58 kg)  12/30/13 108 lb 1.6 oz (49.034 kg)  12/23/13 112 lb (50.803 kg)  12/18/13 112 lb (50.803 kg)  12/06/13 113 lb (51.256 kg)  11/20/13 114 lb (51.71 kg)  11/13/13 114 lb 4.8 oz (51.846 kg)  10/01/13 117 lb 11.2 oz (53.388 kg)  09/19/13 119 lb 14.4 oz (54.386 kg)    Usual Body Weight: 121 lbs  % Usual Body Weight: 87%  BMI:  Body mass index is 20.57 kg/(m^2).  Estimated Nutritional Needs: Kcal: 1450-1650 Protein: 60-70 gram Fluid: >/=1650 ml/dail  Skin: WDL, no edema  Diet Order: Full Liquid  EDUCATION NEEDS: -Education needs addressed   Intake/Output Summary (Last 24 hours)  at 01/14/14 1024 Last data filed at 01/14/14 0617  Gross per 24 hour  Intake      0 ml  Output   4050 ml  Net  -4050 ml    Last BM: 3/15   Labs:   Recent Labs Lab 01/13/14 1134 01/13/14 1745 01/14/14 0115 01/14/14 0631  NA 124* 129* 129*  130*  K 3.7  --   --  3.7  CL 86*  --   --  95*  CO2 22  --   --  20  BUN 7  --   --  6  CREATININE 0.60  --   --  0.59  CALCIUM 9.1  --   --  8.2*  MG  --   --   --  2.0  PHOS  --   --   --  3.1  GLUCOSE 111*  --   --  115*    CBG (last 3)  No results found for this basename: GLUCAP,  in the last 72 hours  Scheduled Meds: . B-complex with vitamin C  1 tablet Oral Daily  . dexamethasone  4 mg Oral BID  . levothyroxine  75 mcg Oral QAC breakfast  . multivitamin with minerals  1 tablet Oral Daily  . protein supplement  1 scoop Oral TID WC  . traZODone  25 mg Oral QHS    Continuous Infusions: . sodium chloride      Past Medical History  Diagnosis Date  . 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 Hypothyroid   . Enlarged lymph nodes   . BREAST CANCER 04/2001    s/p radiation therapy, chemotherapy and lumpectomy  . 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  . S/P radiation therapy     Right breast/regional lymph nodes/ 5040 cGy / 28 fractions/ bost of 1200 cGy/ 6 Fractions  . Status post chemotherapy     6 cycles of CMF completed on 12/12/2001  . Right shoulder pain     started late 2012  . Dry cough     intermittent  . SOB (shortness of breath)   . Use of letrozole (Femara) 03/14/2013  . Metastasis to bone 03/13/13    PET scan results  . Metastasis to bone     breast primary  . Cholelithiasis   . Anemia   . Cancer     brain mets  . Stroke   . Lung cancer 10/08/2013  . 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 Tonsillectomy    . Breast lumpectomy  04/2001    excision of L breast fibroadenoma by Dr.   . Breast lumpectomy  05/16/2001    Right axillary sentinel lymph node biopsy.  . Portacath placement  09/19/2012    Procedure: INSERTION PORT-A-CATH;  Surgeon: Haywood Lasso, MD;  Location: [facility];  Service:  General;  Laterality: N/A;  . Lipoma excision Right 05/15/2013    Procedure: RIGHT AXILLARY LYMPH NODE DISSECTION;  Surgeon: Haywood Lasso, MD;  Location: [facility];  Service: General;  Laterality: Right;    Atlee Abide MS RD LDN Clinical Dietitian [phone]

## 2014-01-15 DIAGNOSIS — C787 Secondary malignant neoplasm of liver and intrahepatic bile duct: Secondary | ICD-10-CM

## 2014-01-15 LAB — CBC
HEMATOCRIT: 26 % — AB (ref 36.0–46.0)
HEMOGLOBIN: 9 g/dL — AB (ref 12.0–15.0)
MCH: 27.4 pg (ref 26.0–34.0)
MCHC: 34.6 g/dL (ref 30.0–36.0)
MCV: 79.3 fL (ref 78.0–100.0)
Platelets: 93 10*3/uL — ABNORMAL LOW (ref 150–400)
RBC: 3.28 MIL/uL — ABNORMAL LOW (ref 3.87–5.11)
RDW: 16.1 % — ABNORMAL HIGH (ref 11.5–15.5)
WBC: 4.2 10*3/uL (ref 4.0–10.5)

## 2014-01-15 LAB — COMPREHENSIVE METABOLIC PANEL
ALK PHOS: 167 U/L — AB (ref 39–117)
ALT: 68 U/L — ABNORMAL HIGH (ref 0–35)
AST: 80 U/L — ABNORMAL HIGH (ref 0–37)
Albumin: 3.1 g/dL — ABNORMAL LOW (ref 3.5–5.2)
BUN: 8 mg/dL (ref 6–23)
CALCIUM: 8.8 mg/dL (ref 8.4–10.5)
CO2: 25 mEq/L (ref 19–32)
Chloride: 93 mEq/L — ABNORMAL LOW (ref 96–112)
Creatinine, Ser: 0.65 mg/dL (ref 0.50–1.10)
GFR calc non Af Amer: >90 mL/min (ref >90)
GLUCOSE: 104 mg/dL — AB (ref 70–99)
POTASSIUM: 3.8 meq/L (ref 3.7–5.3)
Sodium: 129 mEq/L — ABNORMAL LOW (ref 137–147)
Total Bilirubin: 0.4 mg/dL (ref 0.3–1.2)
Total Protein: 5.9 g/dL — ABNORMAL LOW (ref 6.0–8.3)

## 2014-01-15 MED ORDER — MAGNESIUM HYDROXIDE 400 MG/5ML PO SUSP
30.0000 mL | Freq: Every day | ORAL | Status: DC | PRN
  Administered 2014-01-15 – 2014-01-16 (×2): 30 mL via ORAL
  Filled 2014-01-15 (×2): qty 30

## 2014-01-15 MED ORDER — PRO-STAT SUGAR FREE PO LIQD
30.0000 mL | Freq: Three times a day (TID) | ORAL | Status: DC
  Administered 2014-01-16: 30 mL via ORAL
  Filled 2014-01-15 (×17): qty 30

## 2014-01-15 NOTE — Progress Notes (Signed)
Subjective: The patient is seen and examined today. She is feeling a little bit better and has more energy. The patient  Had several questions as usual and my visit was very extended with her today. She had a recent CT scan of the chest, abdomen and pelvis that showed evidence for mild disease progression especially in the liver. She is currently off treatment with Gilotrif. She denied having any fever or chills. She has no chest pain, shortness of breath, cough or hemoptysis.  Objective: Vital signs in last 24 hours: Temp:  [98.5 F (36.9 C)-99.3 F (37.4 C)] 98.9 F (37.2 C) (03/18 1402) Pulse Rate:  [82-92] 92 (03/18 1402) Resp:  [16-18] 18 (03/18 1402) BP: (106-118)/(60-68) 118/60 mmHg (03/18 1402) SpO2:  [99 %-100 %] 100 % (03/18 1402)  Intake/Output from previous day: 03/17 0701 - 03/18 0700 In: 788.3 [P.O.:240; I.V.:548.3] Out: 2675 [Urine:2675] Intake/Output this shift: Total I/O In: 400 [I.V.:400] Out: 1200 [Urine:1200]  General appearance: alert, cooperative, fatigued and no distress Resp: clear to auscultation bilaterally Cardio: regular rate and rhythm, S1, S2 normal, no murmur, click, rub or gallop GI: soft, non-tender; bowel sounds normal; no masses,  no organomegaly Extremities: extremities normal, atraumatic, no cyanosis or edema  Lab Results:   Recent Labs  01/14/14 0631 01/15/14 1120  WBC 5.0 4.2  HGB 8.9* 9.0*  HCT 25.1* 26.0*  PLT 82* 93*   BMET  Recent Labs  01/14/14 0631 01/14/14 1109 01/15/14 1120  NA 130* 127* 129*  K 3.7  --  3.8  CL 95*  --  93*  CO2 20  --  25  GLUCOSE 115*  --  104*  BUN 6  --  8  CREATININE 0.59  --  0.65  CALCIUM 8.2*  --  8.8    Studies/Results: Ct Chest W Contrast  01/14/2014   CLINICAL DATA:  restaging stage IV cancerRestaging stage IV cancer  EXAM: CT CHEST, ABDOMEN AND PELVIS WITHOUT CONTRAST  TECHNIQUE: Multidetector CT imaging of the chest, abdomen and pelvis was performed following the standard protocol  without IV contrast.  COMPARISON:  None.  MR CHEST MEDIAS WO/W CM dated 12/18/2013; NM PET IMAGE RESTAG (PS) SKULL BASE TO THIGH dated 03/13/2013; NM PET IMAGE RESTAG (PS) SKULL BASE TO THIGH dated 10/08/2013  FINDINGS: CT CHEST FINDINGS  The thoracic inlet is unremarkable.  A 6.1 mm lymph node is appreciated within the prevascular space. An 8.5 mm subcarinal lymph node is identified. A 10 mm left hilar lymph node is appreciated image 22 series 2.  Postsurgical changes identified within the right axilla in the region of the previously described index nodule. There is mild increased soft tissue attenuation within the surgical bed which may represent postsurgical change. Findings are again consistent with a lumpectomy within the lateral aspect of the right breast. A left-sided port a catheter is identified with tip projecting in the region of the superior vena caval right atrial junction.  The an 11 mm lingular nodule is again identified. There is slight increased peripheral spiculation below though the size is unchanged. An ill-defined area of ground-glass density projects in the anterior base of the lingula image 23 series for which is increased and size and conspicuity measuring 8.8 mm. A small adjacent satellite nodule projects posteriorly measuring 4 mm. The 5 mm nodule within the periphery of the lingula image 20 is slightly decreased in conspicuity measuring 4 mm. The spiculated nodule within the left lower lobe image 34 series 4 is unchanged measuring 12  x 9 mm.  Stable subpleural interstitial changes identified within the anterior aspect of the right upper lobe. A small 4.2 mm nodule was developed within the anterior aspect of the right upper lobe image 16 series 4. A 7.3 mm spiculated nodule projects anteriorly within the right middle lobe image 22 series 4. A 5.5 mm nodule is also identified posteriorly within the right middle lobe seen image. The pulmonary nodule in the anterior aspect of the right middle  lobe has increased in size and extends to the subpleural region with greatest confluence measuring 17 x 9 mm image 32. When compared to the previous study there has been interval development of smaller less than 5 mm size pulmonary nodules within the base of the right middle lobe. The 7 mm pulmonary nodule posteriorly within the right lower lobe image 35 series 4 has increased in size now measuring 8 mm. A 9 mm spiculated nodules appreciated centrally within the right upper lobe image 13 series 4.  No focal regions of consolidation are identified nor evidence of focal infiltrates. There is prominence of the interstitial markings within the right and left hemithoraces.  CT ABDOMEN AND PELVIS FINDINGS  Evaluation of the liver demonstrates low attenuating ill-defined nodules within the anterior dome of the right lobe of the liver as well as adjacent to the the junction of the right and left lobes of the liver image 45 series 2. A dominant nodules appreciated within the anterior central portion of the right lobe of the liver demonstrating heterogeneous enhancement and measuring 19 x 16 mm in AP by transverse dimensions, increased in size when correlated with dated 10/08/2013.  The spleen, adrenals, pancreas, kidneys are unremarkable.  There is no evidence of bowel obstruction, enteritis, colitis, diverticulitis, nor appendicitis.  There is no evidence of abdominal aortic aneurysm. The celiac, SMA, IMA, portal vein are opacified.  There is no evidence of abdominal free fluid, loculated fluid collections, nor masses. A 9 mm lymph node is appreciated along the gastrohepatic ligament. A 11 mm celiac plexus lymph node is identified. Image 47 series 2.  Evaluation of the pelvis demonstrates an oval shaped area of soft tissue attenuation in the posterior left adnexa measuring 3.8 x 1.9 cm in AP by transverse dimensions demonstrating Hounsfield units of 41.7. No further pelvic masses, free fluid, loculated fluid collections or  adenopathy is appreciated.  Evaluation osseous structures demonstrates diffuse sclerotic density throughout the appendicular and axial skeleton. An expansile lesion is identified within the inferior pubic ramus on the right which may reflect sequela prior fracture likely pathologic or an expansile focus of metastatic disease.  IMPRESSION: 1. Progression of metastatic disease within the mediastinum and lung parenchyma. There been increased size of the lung nodules and increased numbers. Interstitial findings within the lungs which may reflect the sequela of lymph and take infiltration. Chronic interstitial fibrotic change is of diagnostic consideration. 2. Interval increase  burden of metastatic disease within the liver. 3. Diffuse metastatic disease throughout the appendicular and axial skeleton 4. Findings concerning for a soft tissue mass within the pelvis on the left which may reflect a metastatic disease or possibly an ovarian origin. 5. Re evaluation with total body PET-CT is recommended.   Electronically Signed   By: Margaree Mackintosh M.D.   On: 01/14/2014 11:40   Ct Abdomen Pelvis W Contrast  01/14/2014   CLINICAL DATA:  restaging stage IV cancerRestaging stage IV cancer  EXAM: CT CHEST, ABDOMEN AND PELVIS WITHOUT CONTRAST  TECHNIQUE: Multidetector CT imaging  of the chest, abdomen and pelvis was performed following the standard protocol without IV contrast.  COMPARISON:  None.  MR CHEST MEDIAS WO/W CM dated 12/18/2013; NM PET IMAGE RESTAG (PS) SKULL BASE TO THIGH dated 03/13/2013; NM PET IMAGE RESTAG (PS) SKULL BASE TO THIGH dated 10/08/2013  FINDINGS: CT CHEST FINDINGS  The thoracic inlet is unremarkable.  A 6.1 mm lymph node is appreciated within the prevascular space. An 8.5 mm subcarinal lymph node is identified. A 10 mm left hilar lymph node is appreciated image 22 series 2.  Postsurgical changes identified within the right axilla in the region of the previously described index nodule. There is mild  increased soft tissue attenuation within the surgical bed which may represent postsurgical change. Findings are again consistent with a lumpectomy within the lateral aspect of the right breast. A left-sided port a catheter is identified with tip projecting in the region of the superior vena caval right atrial junction.  The an 11 mm lingular nodule is again identified. There is slight increased peripheral spiculation below though the size is unchanged. An ill-defined area of ground-glass density projects in the anterior base of the lingula image 23 series for which is increased and size and conspicuity measuring 8.8 mm. A small adjacent satellite nodule projects posteriorly measuring 4 mm. The 5 mm nodule within the periphery of the lingula image 20 is slightly decreased in conspicuity measuring 4 mm. The spiculated nodule within the left lower lobe image 34 series 4 is unchanged measuring 12 x 9 mm.  Stable subpleural interstitial changes identified within the anterior aspect of the right upper lobe. A small 4.2 mm nodule was developed within the anterior aspect of the right upper lobe image 16 series 4. A 7.3 mm spiculated nodule projects anteriorly within the right middle lobe image 22 series 4. A 5.5 mm nodule is also identified posteriorly within the right middle lobe seen image. The pulmonary nodule in the anterior aspect of the right middle lobe has increased in size and extends to the subpleural region with greatest confluence measuring 17 x 9 mm image 32. When compared to the previous study there has been interval development of smaller less than 5 mm size pulmonary nodules within the base of the right middle lobe. The 7 mm pulmonary nodule posteriorly within the right lower lobe image 35 series 4 has increased in size now measuring 8 mm. A 9 mm spiculated nodules appreciated centrally within the right upper lobe image 13 series 4.  No focal regions of consolidation are identified nor evidence of focal  infiltrates. There is prominence of the interstitial markings within the right and left hemithoraces.  CT ABDOMEN AND PELVIS FINDINGS  Evaluation of the liver demonstrates low attenuating ill-defined nodules within the anterior dome of the right lobe of the liver as well as adjacent to the the junction of the right and left lobes of the liver image 45 series 2. A dominant nodules appreciated within the anterior central portion of the right lobe of the liver demonstrating heterogeneous enhancement and measuring 19 x 16 mm in AP by transverse dimensions, increased in size when correlated with dated 10/08/2013.  The spleen, adrenals, pancreas, kidneys are unremarkable.  There is no evidence of bowel obstruction, enteritis, colitis, diverticulitis, nor appendicitis.  There is no evidence of abdominal aortic aneurysm. The celiac, SMA, IMA, portal vein are opacified.  There is no evidence of abdominal free fluid, loculated fluid collections, nor masses. A 9 mm lymph node is appreciated along  the gastrohepatic ligament. A 11 mm celiac plexus lymph node is identified. Image 47 series 2.  Evaluation of the pelvis demonstrates an oval shaped area of soft tissue attenuation in the posterior left adnexa measuring 3.8 x 1.9 cm in AP by transverse dimensions demonstrating Hounsfield units of 41.7. No further pelvic masses, free fluid, loculated fluid collections or adenopathy is appreciated.  Evaluation osseous structures demonstrates diffuse sclerotic density throughout the appendicular and axial skeleton. An expansile lesion is identified within the inferior pubic ramus on the right which may reflect sequela prior fracture likely pathologic or an expansile focus of metastatic disease.  IMPRESSION: 1. Progression of metastatic disease within the mediastinum and lung parenchyma. There been increased size of the lung nodules and increased numbers. Interstitial findings within the lungs which may reflect the sequela of lymph and  take infiltration. Chronic interstitial fibrotic change is of diagnostic consideration. 2. Interval increase  burden of metastatic disease within the liver. 3. Diffuse metastatic disease throughout the appendicular and axial skeleton 4. Findings concerning for a soft tissue mass within the pelvis on the left which may reflect a metastatic disease or possibly an ovarian origin. 5. Re evaluation with total body PET-CT is recommended.   Electronically Signed   By: Margaree Mackintosh M.D.   On: 01/14/2014 11:40    Medications: I have reviewed the patient's current medications.  Assessment/Plan: 1) history of breast carcinoma diagnosed in 2002.  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 She was recently treated with oral EGFR tyrosine kinase inhibitor, Gilotrif status post 2 months of treatment. Repeat CT scan of the chest, abdomen and pelvis showed evidence for disease progression especially in the liver in addition to pulmonary nodules. I had a lengthy discussion with the patient today about her scan results and treatment options. I explained to the patient that I would discontinue treatment with Gilotrif at this point. She was wondering about other oral agents for treatment of her condition. She has a very complicated condition with metastatic carcinoma of unknown primary questionable to be breast versus lung. Her tumor has EGFR mutation in exon 19 but the patient did not response to the treatment with Gilotrif. She was wondering about possibility of treatment with Tarceva and I explained to the patient that Tarceva most likely would not work at her condition after she failed treatment with Gilotrif. I will discuss her case and treatment options with Dr. Jana Hakim, her primary oncologist to see if he has any recommendation regarding her treatment.  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   4) generalized weakness and fatigue: secondary to new metastatic brain lesions as well as metastatic carcinoma. Continue supportive care and PT/OT.  5) protein calorie Malnutrition: consider nutritional consult.    LOS: 2 days    Petr Bontempo K. 01/15/2014

## 2014-01-15 NOTE — ED Provider Notes (Signed)
Medical screening examination/treatment/procedure(s) were performed by non-physician practitioner and as supervising physician I was immediately available for consultation/collaboration.   EKG Interpretation None       Merryl Hacker, MD 01/15/14 [phone]

## 2014-01-15 NOTE — Progress Notes (Addendum)
[facility] PROGRESS NOTE  Debbie Larsen JGG:[id number] DOB: 07/08/1951 DOA: 01/13/2014 PCP: Melrose Nakayama, MD  Brief narrative: 63 y.o. 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 Repeat PET scan on 03/13/2013 showed evidence for disease progression. Pt was admitted 01/13/2014 for progressive weakness, poor oral intake, weight loss.   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 Julien Nordmann in regards to treatment options - Patient is on dexamethasone 4 mg by mouth twice a day for brain metastasis but has refused to take it as it makes her "feel wierd" Hyponatremia  - Dehydration versus SIADH from malignancy  - Sodium ranges 127 to 130  - follow up BMP this am - continue IV fluids  Hypothyroidism  - continue synthroid 75 mcg daily  Anemia / Thrombocytopenia  - Secondary to chronic disease secondary to malignancy and sequela of chemotherapy  - Hemoglobin 8.9 and platelet count 82  -  Continue to monitor for signs of bleed  Elevated liver enzymes  - likely due to metastatic disease  Severe protein calorie malnutrition  - nutrition consulted  - continue protein supplements   Code Status: full code  Family Communication: no family at the bedside  Disposition Plan: home when stable   Consultants:  Oncology (Dr. Julien Nordmann) Procedures:  None  Antibiotics:  None   [name], MD  [facility] Pager [phone]  If 7PM-7AM, please contact night-coverage [email or web address] Password [facility] 01/15/2014, 9:37 AM   LOS: 2 days    HPI/Subjective: No acute overnight events.  Objective: Filed Vitals:   01/14/14 0610 01/14/14 1500 01/14/14 2236 01/15/14 0705  BP: 120/80 110/50 112/68 106/60  Pulse: 93 96 92 82  Temp: 98.5 F (36.9 C) 98.4 F (36.9 C) 98.5 F (36.9 C) 99.3 F (37.4 C)  TempSrc: Oral Oral Oral Oral  Resp: 16 16 16 16   Height:      Weight: 47.764 kg (105 lb 4.8 oz)     SpO2: 100% 100% 100% 99%    Intake/Output Summary (Last 24 hours) at 01/15/14 4656 Last data filed at 01/14/14 2330  Gross per 24 hour  Intake 788.33 ml  Output   2675 ml  Net -1886.67 ml    Exam:   General:  Pt is alert, follows commands appropriately, not in acute distress  Cardiovascular: Regular rate and rhythm, S1/S2 appreciated   Respiratory: Clear to auscultation bilaterally, no wheezing, no crackles, no rhonchi  Abdomen: Soft, non tender, non distended, bowel sounds present, no guarding  Extremities: No edema, pulses DP and PT palpable bilaterally  Neuro: B/L LE weakness  Data Reviewed: Basic Metabolic Panel:  Recent Labs Lab 01/13/14 1134 01/13/14 1745 01/14/14 0115 01/14/14 0631 01/14/14 1109  NA 124* 129* 129* 130* 127*  K 3.7  --   --  3.7  --   CL 86*  --   --  95*  --   CO2 22  --   --  20  --   GLUCOSE 111*  --   --  115*  --   BUN 7  --   --  6  --   CREATININE 0.60  --   --  0.59  --   CALCIUM 9.1  --   --  8.2*  --   MG  --   --    --  2.0  --   PHOS  --   --   --  3.1  --    Liver Function Tests:  Recent Labs Lab 01/13/14 1134 01/14/14 0631  AST 70* 60*  ALT 77* 65*  ALKPHOS 187* 152*  BILITOT 0.5 0.3  PROT 7.2 5.9*  ALBUMIN 3.7 3.0*   No results found for this basename: LIPASE, AMYLASE,  in the last 168 hours No results found for this basename: AMMONIA,  in the last 168 hours CBC:  Recent Labs Lab 01/13/14 1134 01/14/14 0631  WBC 4.9 5.0  NEUTROABS 4.0  --   HGB 10.9* 8.9*  HCT 30.9* 25.1*  MCV 76.9* 77.7*  PLT 81* 82*   Cardiac Enzymes: No results found for this basename: CKTOTAL, CKMB, CKMBINDEX, TROPONINI,  in the last 168 hours BNP: No components found with this basename: POCBNP,  CBG: No results found for this basename: GLUCAP,  in the last 168 hours  No results found for this or any previous visit (from the past 240 hour(s)).   Studies: Ct Chest W Contrast 01/14/2014    IMPRESSION: 1. Progression of metastatic disease within the mediastinum and lung parenchyma. There been increased size of the lung nodules and increased numbers. Interstitial findings within the lungs which may reflect the sequela of lymph and take infiltration. Chronic interstitial fibrotic change is of diagnostic consideration. 2. Interval increase  burden of metastatic disease within the liver. 3. Diffuse metastatic disease throughout the appendicular and axial skeleton 4. Findings concerning for a soft tissue mass within the pelvis on the left which may reflect a metastatic disease or possibly an ovarian origin. 5. Re evaluation with total body PET-CT is recommended.     Ct Abdomen Pelvis W Contrast 01/14/2014   IMPRESSION: 1. Progression of metastatic disease within the mediastinum and lung parenchyma. There been increased size of the lung nodules and increased numbers. Interstitial findings within the lungs which may reflect the sequela of lymph and take infiltration. Chronic interstitial fibrotic change is of diagnostic  consideration. 2. Interval increase  burden of metastatic disease within the liver. 3. Diffuse metastatic disease throughout the appendicular and axial skeleton 4. Findings concerning for a soft tissue mass within the pelvis on the left which may reflect a metastatic disease or possibly an ovarian origin. 5. Re evaluation with total body PET-CT is recommended.      Scheduled Meds: . B-complex with vitamin C  1 tablet Oral Daily  . dexamethasone  4 mg Oral BID  . levothyroxine  75 mcg Oral QAC breakfast  . multivitamin with minerals  1 tablet Oral Daily  . protein supplement  1 scoop Oral TID WC  . traZODone  25 mg Oral QHS   Continuous Infusions: . sodium chloride 50 mL/hr at 01/14/14  1402         

## 2014-01-16 MED ORDER — BOOST / RESOURCE BREEZE PO LIQD
1.0000 | Freq: Three times a day (TID) | ORAL | Status: DC
  Administered 2014-01-16 – 2014-01-28 (×20): 1 via ORAL

## 2014-01-16 MED ORDER — SODIUM CHLORIDE 0.9 % IV SOLN
4.0000 mg | Freq: Once | INTRAVENOUS | Status: AC
  Administered 2014-01-16: 4 mg via INTRAVENOUS
  Filled 2014-01-16: qty 5

## 2014-01-16 MED ORDER — ALUM & MAG HYDROXIDE-SIMETH 200-200-20 MG/5ML PO SUSP: 15.0000 mL | ORAL | Status: DC | PRN

## 2014-01-16 MED ORDER — MIRTAZAPINE 7.5 MG PO TABS
7.5000 mg | ORAL_TABLET | Freq: Every day | ORAL | Status: DC
  Administered 2014-01-16 – 2014-01-19 (×4): 7.5 mg via ORAL
  Filled 2014-01-16 (×5): qty 1

## 2014-01-16 MED ORDER — ACETAMINOPHEN 325 MG PO TABS
650.0000 mg | ORAL_TABLET | Freq: Four times a day (QID) | ORAL | Status: DC | PRN
  Administered 2014-01-16: 325 mg via ORAL
  Administered 2014-01-18 – 2014-01-20 (×3): 650 mg via ORAL
  Filled 2014-01-16 (×4): qty 2

## 2014-01-16 NOTE — Plan of Care (Signed)
Problem: Phase I Progression Outcomes Goal: OOB as tolerated unless otherwise ordered Outcome: Progressing Pt sat up in chair 4 hours

## 2014-01-16 NOTE — Consult Note (Addendum)
Physical Medicine and Rehabilitation Consult Reason for Consult: Deconditioning related to metastatic carcinoma Referring Physician: [facility]   HPI: Debbie Larsen is a 63 y.o. right-handed female with history significant for breast cancer carcinoma diagnosed 2002 status post chemotherapy and radiation. Recent repeat PET scan on 03/13/2013 showed evidence for disease progression. Patient independent up until 2 weeks ago. Admitted 01/13/2014 with weakness and inability to walk with poor appetite as well as weight loss. Findings significant for thrombocytopenia as well as hyponatremia of 120. CT of the chest showed progression of metastatic disease within the mediastinum and lung parenchyma. There was increased size of lung nodules as well as interval increase burden of metastatic disease within the liver as well as appendicularand axial skeleton. Also findings concerning for soft tissue mass within the pelvis on the left that could reflect a metastatic disease. Oncology service to followup on recommendations. Physical therapy evaluation completed 01/16/2014 at the recommendations of physical medicine rehabilitation consult.  Patient feels her weakness is related to her low sodium level, I explained that this was not to cause for her lower extremity weakness. 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 Review of Systems  Constitutional: Positive for weight loss.  Musculoskeletal: Positive for myalgias.  Neurological: Positive for weakness.  All other systems reviewed and are negative.   Past Medical History  Diagnosis Date  . 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 Hypothyroid   . Enlarged lymph nodes   . 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  . S/P radiation therapy     Right breast/regional lymph nodes/ 5040 cGy / 28 fractions/ bost of 1200 cGy/ 6 Fractions  . Status post chemotherapy     6 cycles of CMF completed on 12/12/2001  . Right shoulder pain     started late 2012  . Dry cough     intermittent  . SOB (shortness of breath)   . Use of letrozole (Femara) 03/14/2013  . Cholelithiasis   . Anemia   . Stroke   . BREAST CANCER 04/2001    s/p radiation therapy, chemotherapy and lumpectomy  . Metastasis to bone 03/13/13    PET scan results  . Metastasis to bone     breast primary  . Cancer     brain mets  . Lung cancer 10/08/2013  . 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 Tonsillectomy    . Breast lumpectomy  04/2001    excision of L breast fibroadenoma by Dr.   . Breast lumpectomy  05/16/2001    Right axillary sentinel lymph node biopsy.  . Portacath placement  09/19/2012    Procedure: INSERTION PORT-A-CATH;  Surgeon: Haywood Lasso, MD;  Location: [facility];  Service: General;  Laterality: N/A;  . Lipoma excision Right 05/15/2013    Procedure: RIGHT AXILLARY LYMPH NODE DISSECTION;  Surgeon: Haywood Lasso, MD;  Location: [facility];  Service: General;  Laterality: Right;   Family History  Problem Relation Age of Onset  . Diabetes Mother   . Hypertension Father   . Hypertension Sister   . Hypothyroidism Brother   . Cancer Neg Hx    Social History:  reports that  she has never smoked. She has never used smokeless tobacco. She reports that she does not drink alcohol or use illicit drugs. Allergies: No Known Allergies Medications Prior to Admission  Medication Sig Dispense Refill  . afatinib dimaleate (GILOTRIF) 30 MG tablet Take 1 tablet (30 mg total) by mouth daily. Take on an empty stomach 1hr before or 2hrs after meals.  30 tablet  2  . B Complex-C (B-COMPLEX WITH VITAMIN C) tablet Take 1 tablet by mouth daily.       Marland Kitchen dexamethasone (DECADRON) 4 MG tablet Take 1 tablet (4 mg total) by mouth 2 (two) times daily.  60 tablet  0  . gabapentin (NEURONTIN) 100 MG capsule Take one to 3 tablets every night for right arm pain  90 capsule  12  . levothyroxine (SYNTHROID, LEVOTHROID) 75 MCG tablet Take 1 tablet (75 mcg total) by mouth every evening.  30 tablet  3  . lidocaine-prilocaine (EMLA) cream Apply topically as needed. Apply to port 1-2 hours before procedure  30 g  0  . Multiple Vitamin (MULTIVITAMIN WITH MINERALS) TABS Take 1 tablet by mouth daily.      . prochlorperazine (COMPAZINE) 10 MG tablet Take 1 tablet (10 mg total) by mouth every 6 (six) hours as needed for nausea or vomiting.  30 tablet  0    Home: Home Living Family/patient expects to be discharged to:: Private residence Living Arrangements: Alone Available Help at Discharge: Neighbor Type of Home: House Home Access: Stairs to enter [employer] of Steps: 4 Entrance Stairs-Rails: None Home Layout: One level Home Equipment: Environmental consultant - 2 wheels;Cane - single point Additional Comments: states she started to feel week about 5 days PTA  Functional History:   Functional Status:  Mobility:          ADL:    Cognition: Cognition Overall Cognitive Status: Within Functional Limits for tasks assessed Cognition Arousal/Alertness: Awake/alert Behavior During Therapy: WFL for tasks assessed/performed Overall Cognitive Status: Within Functional Limits for tasks assessed  Blood pressure 110/70, pulse 95, temperature 98.4 F (36.9 C), temperature source Oral, resp. rate 20, height 5' (1.524 m), weight 47.764 kg (105 lb 4.8 oz), SpO2 99.00%. Physical Exam  Vitals reviewed. Constitutional: She is oriented to person, place, and time.  63 year old frail female  HENT:  Head: Normocephalic.  Eyes: EOM are normal.  Neck: Normal range of motion. Neck supple. No thyromegaly present.  Cardiovascular: Normal rate and regular rhythm.     Respiratory: Effort normal and breath sounds normal. No respiratory distress.  GI: Soft. Bowel sounds are normal. She exhibits no distension.  Neurological: She is alert and oriented to person, place, and time.  Follows commands  Skin: Skin is warm and dry.   right upper extremity motor strength 3 minus right deltoid 4 minus biceps triceps 4 minus grip 3 minus hand intrinsics Left upper extremity 4+ in the deltoid bicep triceps and grip as well as hand intrinsics Right lower extremity and left lower extremity 0/5 hip flexor knee extensor knee flex her ankle dorsiflexor and ankle plantar flexor Sensation reduced in both lower extremities to light touch proprioception appears intact Deep tendon reflexes are absent No results found for this or any previous visit (from the past 24 hour(s)). No results found.  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 1. Does the need for close, 24 hr/day medical supervision in concert with the patient's rehab needs make it unreasonable for this patient  to be served in a less intensive setting? Potentially 2. Co-Morbidities requiring supervision/potential complications: Metastatic breast carcinoma with deconditioning 3. Due to bladder management, bowel management, safety, skin/wound care, disease management, medication administration, pain management and patient education, does the patient require 24 hr/day rehab nursing? Potentially 4. Does the patient require coordinated care of a physician, rehab nurse, PT (0.5-1 hrs/day, 5 days/week), OT (0.521 hrs/day, 5 days/week) and SLP (0.5-1 hrs/day, 5 days/week) to address physical and functional deficits in the context of the above medical diagnosis(es)? Potentially Addressing deficits in the following areas: balance, endurance, locomotion, strength, transferring, bowel/bladder control, bathing, dressing, feeding, grooming,  toileting and cognition 5. Can the patient actively participate in an intensive therapy program of at least 3 hrs of therapy per day at least 5 days per week? No 6. The potential for patient to make measurable gains while on inpatient rehab is poor 7. Anticipated functional outcomes upon discharge from inpatient rehab are mod to max assist wheelchair level with PT, Mod/ Max assist wheelchair level with OT, supervision medication management with SLP. 8. Estimated rehab length of stay to reach the above functional goals is: NA 9. Does the patient have adequate social supports to accommodate these discharge functional goals? No 10. Anticipated D/C setting: Other 11. Anticipated post D/C treatments: N/A 12. Overall Rehab/Functional Prognosis: poor  RECOMMENDATIONS: This patient's condition is appropriate for continued rehabilitative care in the following setting: SNF Patient has agreed to participate in recommended program. Potentially Note that insurance prior authorization may be required for reimbursement for recommended care.  Comment: Would recommend thoracic and lumbar MRI to evaluate etiology of paraplegia. Patient with very poor awareness of deficits and unrealistic expectations to ambulate. Will not be able to resume independent living. Recommend SNF    01/16/2014

## 2014-01-16 NOTE — Evaluation (Addendum)
Physical Therapy Evaluation Patient Details Name: Debbie Larsen MRN: [id number] DOB: July 07, 1951 Today's Date: 01/16/2014 Time: 0938-1829 PT Time Calculation (min): 51 min  PT Assessment / Plan / Recommendation History of Present Illness  63 y.o. 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 Repeat PET scan on 03/13/2013 showed evidence for disease progression. Pt was admitted 01/13/2014 for progressive weakness, poor oral intake, weight loss.   Clinical Impression  *Pt admitted with weakness, inability to walk, hyponatremia, metastatic cancer*. Mass noted on pelvic imaging. Pt currently with functional limitations due to the deficits listed below (see PT Problem List).  Pt will benefit from skilled PT to increase their independence and safety with mobility to allow discharge to the venue listed below.   Pt has new BLE paresis. No active movement of BLEs was elicited during PT eval.  Pt would be a good candidate for CIR. She is highly motivated to progress with mobility.  Spoke to RN and requested order for air mattress overlay due to risk for skin breakdown. **    PT Assessment  Patient needs continued PT services    Follow Up Recommendations  CIR    Does the patient have the potential to tolerate intense rehabilitation      Barriers to Discharge Decreased caregiver support      Equipment Recommendations  Wheelchair (measurements PT);Wheelchair cushion (measurements PT)    Recommendations for Other Services OT consult   Frequency Min 4X/week    Precautions / Restrictions Precautions Precautions: Fall Precaution Comments: new BLE paresis Restrictions Weight Bearing Restrictions: No   Pertinent  Vitals/Pain *BP 120/70 in sitting SaO2 96% on RA  No c/o pain**      Mobility  Bed Mobility Overal bed mobility: +2 for physical assistance General bed mobility comments: assist for supine to sit, total assist to advance BLEs, mod A to elevate trunk, verbal cues for technique Transfers Overall transfer level: Needs assistance Transfers: Stand Pivot Transfers Stand pivot transfers: Total assist    Exercises     PT Diagnosis: Other (comment);Generalized weakness (BLE paresis)  PT Problem List: Decreased strength;Decreased activity tolerance;Decreased balance;Decreased mobility PT Treatment Interventions: Wheelchair mobility training;Patient/family education;Functional mobility training;Therapeutic activities;Therapeutic exercise;Balance training;Neuromuscular re-education     PT Goals(Current goals can be found in the care plan section) Acute Rehab PT Goals Patient Stated Goal: to get the strength back in my legs PT Goal Formulation: With patient Time For Goal Achievement: 01/30/14 Potential to Achieve Goals: Good  Visit Information  Last PT Received On: 01/16/14 Assistance Needed: +2 History of Present Illness: 63 y.o. 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 Repeat PET scan on 03/13/2013 showed evidence for disease progression. Pt was admitted 01/13/2014 for progressive weakness, poor oral intake, weight loss.        Prior [place] expects to be discharged to:: Private residence Living Arrangements: Alone Available Help at Discharge: Neighbor Type of Home: House Home Access: Stairs to enter [employer] of Steps: 4 Entrance Stairs-Rails: None Home  Layout:  One level Home Equipment: [place] - 2 wheels;Cane - single point Additional Comments: states she started to feel week about 5 days PTA Prior Function Level of Independence: Independent Communication Communication: No difficulties    Cognition  Cognition Arousal/Alertness: Awake/alert Behavior During Therapy: WFL for tasks assessed/performed Overall Cognitive Status: Within Functional Limits for tasks assessed    Extremity/Trunk Assessment Lower Extremity Assessment Lower Extremity Assessment: RLE deficits/detail;LLE deficits/detail RLE Deficits / Details: No active movement in hip, knee, or ankle; pt reports tingling in foot with light touch LLE Deficits / Details: No active movement in hip, knee, or ankle; pt reports tingling in foot with light touch Cervical / Trunk Assessment Cervical / Trunk Assessment: Normal   Balance Balance Overall balance assessment: Needs assistance Sitting-balance support: Bilateral upper extremity supported;Feet supported Sitting balance-Leahy Scale: Poor Sitting balance - Comments: Pt sat on EOB x 20 min with heavy use of BUEs to maintain balance. Pt tends to lean posteriorly and to the R if she had single UE suppport. Worked on reaching, anterior/posterior weight shifts, and R/L weight shifts.  Postural control: Posterior lean;Right lateral lean  End of Session PT - End of Session Equipment Utilized During Treatment: Gait belt Activity Tolerance: Patient tolerated treatment well Patient left: in chair;with call bell/phone within reach Nurse Communication: Need for lift equipment;Mobility status  GP     Debbie Larsen 01/16/2014, 11:52 AM [phone]

## 2014-01-16 NOTE — Progress Notes (Signed)
Rehab Admissions Coordinator Note:  Patient was screened by Retta Diones for appropriateness for an Inpatient Acute Rehab Consult.  Noted PT/OT recommending CIR.  At this time, we are recommending Inpatient Rehab consult.  Retta Diones 01/16/2014, 1:06 PM  I can be reached at [phone].

## 2014-01-16 NOTE — Plan of Care (Signed)
Problem: Phase I Progression Outcomes Goal: Voiding-avoid urinary catheter unless indicated Outcome: Progressing Foley catheter

## 2014-01-16 NOTE — Progress Notes (Addendum)
[facility] PROGRESS NOTE  Debbie Larsen GGE:[id number] DOB: Mar 09, 1951 DOA: 01/13/2014 PCP: Melrose Nakayama, MD  Brief narrative: 63 y.o. 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 Repeat PET scan on 03/13/2013 showed evidence for disease progression. Pt was admitted 01/13/2014 for progressive weakness, poor oral intake, weight loss.   Assessment/Plan:   Principal Problem:  Progressive generalized weakness, failure to thrive  - secondary to progressive metastatic carcinoma  - appreciate PT evaluation and their recomendations  - added resource feeding supplement - Continue supportive care with IV fluids  Active Problems:  Metastatic carcinoma of undetermined primary with metastases to bones and brain  - CT chest showed progression of metastatic disease within the mediastinum and lung parenchyma, increased size of the lung nodules and number of nodules, interval increase in liver metastatic disease, soft tissue mass within the pelvis on the left which could possibly reflect metastatic disease or possibly primary ovarian origin; per oncology primary possibly breast or lung - Will follow up with Dr. Julien Nordmann in regards to treatment options  - Patient is on dexamethasone 4 mg by mouth twice a day for brain metastasis Bone pain - related to bone metastases  - per oncology predomination for Zometa today - also on dexamethasone 4 mg PO BID Hyponatremia  - Dehydration versus SIADH from malignancy  - Sodium trended up from 127 to 129 this am - continue IV fluids  Hypothyroidism  - continue synthroid 75 mcg daily  Anemia / Thrombocytopenia  - Secondary to chronic disease secondary to malignancy and sequela of  chemotherapy  - Hemoglobin 9 and platelet count 93 - Continue to monitor for signs of bleed  Elevated liver enzymes  - likely due to metastatic disease  - AST 80, ALT 68 and ALP 167 (slightly better since admission) Severe protein calorie malnutrition  - nutrition consulted  - continue feeding supplements   Code Status: full code  Family Communication: no family at the bedside  Disposition Plan: home when stable; follow up PT/OT evaluation   Consultants:  Oncology (Dr. Julien Nordmann) Procedures:  None  Antibiotics:  None    [name], MD  [facility] Pager [phone]  If 7PM-7AM, please contact night-coverage [email or web address] Password [facility] 01/16/2014, 9:32 AM   LOS: 3 days    HPI/Subjective: No acute overnight events.   Objective: Filed Vitals:   01/15/14 0705 01/15/14 1402 01/15/14 2220 01/16/14 0650  BP: 106/60 118/60 105/66 97/50  Pulse: 82 92 87 95  Temp: 99.3 F (37.4 C) 98.9 F (37.2 C) 98.2 F (36.8 C) 98.4 F (36.9 C)  TempSrc: Oral Oral Oral Oral  Resp: 16 18 20 20   Height:      Weight:      SpO2: 99% 100% 100% 99%    Intake/Output Summary (Last 24 hours) at 01/16/14 0932 Last data filed at 01/16/14 6568  Gross per 24 hour  Intake   1150 ml  Output   2001 ml  Net   -851 ml    Exam:   General:  Pt is alert, follows commands appropriately, not in acute distress  Cardiovascular: Regular rate and rhythm, S1/S2, no murmurs  Respiratory: Clear to auscultation bilaterally, no wheezing, no crackles  Abdomen: Soft, non tender, non distended, bowel sounds present, no guarding  Extremities: No edema, pulses DP and PT palpable bilaterally  Neuro: B/L LE weakness  Data Reviewed: Basic Metabolic Panel:  Recent Labs Lab 01/13/14 1134 01/13/14 1745 01/14/14 0115 01/14/14 0631 01/14/14 1109 01/15/14 1120  NA 124* 129* 129* 130* 127* 129*  K 3.7  --   --  3.7  --  3.8  CL 86*  --   --  95*  --  93*  CO2 22  --   --  20  --  25  GLUCOSE  111*  --   --  115*  --  104*  BUN 7  --   --  6  --  8  CREATININE 0.60  --   --  0.59  --  0.65  CALCIUM 9.1  --   --  8.2*  --  8.8  MG  --   --   --  2.0  --   --   PHOS  --   --   --  3.1  --   --    Liver Function Tests:  Recent Labs Lab 01/13/14 1134 01/14/14 0631 01/15/14 1120  AST 70* 60* 80*  ALT 77* 65* 68*  ALKPHOS 187* 152* 167*  BILITOT 0.5 0.3 0.4  PROT 7.2 5.9* 5.9*  ALBUMIN 3.7 3.0* 3.1*   No results found for this basename: LIPASE, AMYLASE,  in the last 168 hours No results found for this basename: AMMONIA,  in the last 168 hours CBC:  Recent Labs Lab 01/13/14 1134 01/14/14 0631 01/15/14 1120  WBC 4.9 5.0 4.2  NEUTROABS 4.0  --   --   HGB 10.9* 8.9* 9.0*  HCT 30.9* 25.1* 26.0*  MCV 76.9* 77.7* 79.3  PLT 81* 82* 93*   Cardiac Enzymes: No results found for this basename: CKTOTAL, CKMB, CKMBINDEX, TROPONINI,  in the last 168 hours BNP: No components found with this basename: POCBNP,  CBG: No results found for this basename: GLUCAP,  in the last 168 hours  No results found for this or any previous visit (from the past 240 hour(s)).   Studies: Ct Chest W Contrast 01/14/2014    IMPRESSION: 1. Progression of metastatic disease within the mediastinum and lung parenchyma. There been increased size of the lung nodules and increased numbers. Interstitial findings within the lungs which may reflect the sequela of lymph and take infiltration. Chronic interstitial fibrotic change is of diagnostic consideration. 2. Interval increase  burden of metastatic disease within the liver. 3. Diffuse metastatic disease throughout the appendicular and axial skeleton 4. Findings concerning for a soft tissue mass within the pelvis on the left which may reflect a metastatic disease or possibly an ovarian origin. 5. Re evaluation with total body PET-CT is recommended.   Electronically Signed   By: Margaree Mackintosh M.D.   On: 01/14/2014 11:40   Ct Abdomen Pelvis W  Contrast 01/14/2014   IMPRESSION: 1. Progression of metastatic disease within the mediastinum and lung parenchyma. There been increased size of the lung nodules and increased numbers. Interstitial findings within the lungs which may reflect the sequela of lymph and take infiltration. Chronic interstitial fibrotic change is of diagnostic consideration. 2. Interval increase  burden of metastatic disease within the liver. 3. Diffuse metastatic disease throughout the appendicular and axial skeleton 4. Findings concerning for a soft tissue mass within the pelvis on the left which may reflect a metastatic disease or possibly an ovarian origin. 5. Re evaluation with total body PET-CT is recommended.   Electronically Signed  By: Margaree Mackintosh M.D.   On: 01/14/2014 11:40    Scheduled Meds: . B-complex with vitamin C  1 tablet Oral Daily  . dexamethasone  4 mg Oral BID  .  PRO-STAT  30 mL Oral TID WC  .  (RESOURCE BREEZE)  1 Container Oral TID BM  . levothyroxine  75 mcg Oral QAC breakfast  . mirtazapine  7.5 mg Oral QHS  . multivitamin   1 tablet Oral Daily  . traZODone  25 mg Oral QHS  .  (ZOMETA) IV  4 mg Intravenous Once   Continuous Infusions: . sodium chloride 50 mL/hr at 01/16/14 [phone]

## 2014-01-16 NOTE — Evaluation (Signed)
Occupational Therapy Evaluation Patient Details Name: Debbie Larsen MRN: [id number] DOB: [birth year] Today's Date: 01/16/2014 Time: 8469-6295 OT Time Calculation (min): 50 min  OT Assessment / Plan / Recommendation History of present illness 63 y.o. 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 Repeat PET scan on 03/13/2013 showed evidence for disease progression. Pt was admitted 01/13/2014 for progressive weakness, poor oral intake, weight loss.    Clinical Impression   Pt with new onset of paresis in LEs and poor sitting balance which is significantly impacting her ability to perform her ADL. She was independent PTA and would benefit from skilled OT services to maximize ADL independence for next venue of care.    OT Assessment  Patient needs continued OT Services    Follow Up Recommendations  CIR;Supervision/Assistance - 24 hour    Barriers to Discharge      Equipment Recommendations  3 in 1 bedside comode    Recommendations for Other Services    Frequency  Min 2X/week    Precautions / Restrictions Precautions Precautions: Fall Precaution Comments: new BLE paresis Restrictions Weight Bearing Restrictions: No   Pertinent Vitals/Pain No complaint of pain    ADL  Eating/Feeding: Simulated;Independent Where Assessed - Eating/Feeding: Bed level Grooming: Performed;Wash/dry face;Set up Where Assessed - Grooming: Supported sitting Upper Body Bathing: Chest;Right arm;Left arm;Minimal assistance (pt unable to perform in unsupported sitting due to balance issues) Where Assessed - Upper Body Bathing: Supported sitting Lower Body Bathing: +2 Total assistance (one person for standing, one person for  hygiene) Where Assessed - Lower Body Bathing: Supported sit to stand Upper Body Dressing: Moderate assistance Where Assessed - Upper Body Dressing: Supported sitting Lower Body Dressing: +2 Total assistance Where Assessed - Lower Body Dressing: Supported sit to Lobbyist:  (see PT note for transfer status) Equipment Used: Gait belt ADL Comments: Pt very weak in trunk and LEs. Unable to hold self in unsupported sitting without bilateral UE support on rail and edge of bed. When pt let go with one hand, she would lose balance posteriorly and to the right. All UE self care and grooming had to be performed in supported sitting currently. PT transferred pt to the chair via pivot--see PT notes. Lift pad in chair and nursing informed to use lift for back to bed. Encouraged pt to keep moving UEs to maintain as much strength as possible.  Pt with some initialy dizziness with sitting but improved with time. Pt at at [facility] for approximately 15-20 minutes with bilateral UE support and mod to min assist (improved with time).     OT Diagnosis: Generalized weakness  OT Problem List: Decreased strength;Impaired balance (sitting and/or standing);Decreased knowledge of use of DME or AE OT Treatment Interventions: Self-care/ADL training;DME and/or AE instruction;Therapeutic activities;Patient/family education;Balance training   OT Goals(Current goals can be found in the care plan section) Acute Rehab OT Goals Patient Stated Goal: to get the strength back in my legs OT Goal Formulation: With patient Time For Goal Achievement: 01/30/14 Potential to Achieve Goals: Good  Visit Information  Last OT Received On: 01/16/14 Assistance Needed: +2 PT/OT/SLP Co-Evaluation/Treatment: Yes Reason for Co-Treatment: For patient/therapist safety OT goals addressed during session: ADL's and self-care;Proper use of Adaptive equipment and DME;Strengthening/ROM History of Present Illness: 63 y.o.  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 Repeat PET scan on 03/13/2013 showed evidence for disease progression. Pt was admitted 01/13/2014 for progressive weakness, poor oral intake, weight loss.        Prior [place] expects to be discharged to:: Private residence Living Arrangements: Alone Available Help at Discharge: Neighbor Type of Home: House Home Access: Stairs to enter [employer] of Steps: 4 Entrance Stairs-Rails: None Home Layout: One level Home Equipment: Environmental consultant - 2 wheels;Cane - single point Additional Comments: states she started to feel week about 5 days PTA Prior Function Level of Independence: Independent Communication Communication: No difficulties         Vision/Perception     Cognition  Cognition Arousal/Alertness: Awake/alert Behavior During Therapy: WFL for tasks assessed/performed Overall Cognitive Status: Within Functional Limits for tasks assessed    Extremity/Trunk Assessment Upper Extremity Assessment Upper Extremity Assessment: RUE deficits/detail;LUE deficits/detail RUE Deficits / Details: AROM limited by previous breast surgery --approximately 90 degrees shoulder flexion, elbow distal WFL. strength grossly 3+-4-/5 throughout. grip fair RUE Sensation: decreased light touch (pt reports numbness in 1st and 2nd digit) LUE Deficits / Details: AROM WFl, strength grossly 4/5; grip fair  Cervical / Trunk Assessment Cervical / Trunk Assessment: Normal     Mobility Bed Mobility Overal bed mobility: +2 for physical assistance General bed mobility comments: assist for supine to sit, total assist to advance BLEs, mod A to elevate trunk, verbal cues for  technique     Exercise     Balance Balance Overall balance assessment: Needs assistance Sitting-balance support: Bilateral upper extremity supported;Feet supported Sitting balance-Leahy Scale: Poor Sitting balance - Comments: Pt sat on EOB x 20 min with heavy use of BUEs to maintain balance. Pt tends to lean posteriorly and to the R if she had single UE suppport. Worked on reaching, anterior/posterior weight shifts, and R/L weight shifts.  Postural control: Posterior lean;Right lateral lean   End of Session OT - End of Session Equipment Utilized During Treatment: Gait belt Activity Tolerance: Patient limited by fatigue Patient left: in chair;with call bell/phone within reach Nurse Communication: Mobility status;Need for lift equipment  GO     Jules Schick [phone] 01/16/2014, 12:44 PM

## 2014-01-17 ENCOUNTER — Telehealth: Payer: Self-pay | Admitting: Medical Oncology

## 2014-01-17 NOTE — Progress Notes (Addendum)
Physical Therapy Treatment Patient Details Name: Debbie Larsen MRN: [id number] DOB: [birth year] Today's Date: 01/17/2014 Time: 0910-1005 PT Time Calculation (min): 55 min  PT Assessment / Plan / Recommendation  History of Present Illness 63 y.o. 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 Repeat PET scan on 03/13/2013 showed evidence for disease progression. Pt was admitted 01/13/2014 for progressive weakness, poor oral intake, weight loss.    PT Comments   **Pt continues to be flaccid in BLEs, no active movement. She requires total assist for transfers. Initiated [place] training today. She propelled her WC 350' with min assist to negotiate turns. Pt stated she is hopeful that her leg strength with return. She doesn't seem to understand the source of the BLE paralysis nor prognosis.  Noted R ankle skin tear, RN notified. Pt refused air mattress due to noise. Discussed importance of frequent position changes with pt, she voiced understanding.   *  Follow Up Recommendations  SNF (Noted CIR denied pt, now recommending SNF.)     Does the patient have the potential to tolerate intense rehabilitation     Barriers to Discharge        Equipment Recommendations  Wheelchair (measurements PT);Wheelchair cushion (measurements PT)    Recommendations for Other Services OT consult  Frequency Min 4X/week   Progress towards PT Goals Progress towards PT goals: Progressing toward goals  Plan      Precautions / Restrictions Precautions Precautions: Fall Precaution Comments: new BLE paresis Restrictions Weight Bearing Restrictions: No   Pertinent Vitals/Pain **0/10 pain*    Mobility  Bed Mobility Overal bed mobility: Needs  Assistance Bed Mobility: Rolling;Sidelying to Sit Rolling: Min assist Sidelying to sit: Mod assist General bed mobility comments: assist for supine to sit, total assist to advance BLEs, mod A to elevate trunk, verbal cues for technique Transfers Overall transfer level: Needs assistance Stand pivot transfers: Total assist General transfer comment: bed to WC, then WC to recliner; pt unable to bear any weight through BLEs as they are flaccid Wheelchair Mobility Wheelchair mobility: Yes Wheelchair propulsion: Both upper extremities Wheelchair parts: Needs assistance Distance: [place] Details (indicate cue type and reason): assist for sharp turns, VCs for technique, 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 so tends to veer R, able to lock/unlock brakes with brake extenders, likely needs a smaller WC    Exercises General Exercises - Lower Extremity Ankle Circles/Pumps: PROM;10 reps;Supine;Both Heel Slides: PROM;Both;10 reps;Supine Hip ABduction/ADduction: PROM;Both;10 reps;Supine   PT Diagnosis:    PT Problem List:   PT Treatment Interventions:     PT Goals (current goals can now be found in the care plan section) Acute Rehab PT Goals Patient Stated Goal: to get the strength back in my legs PT Goal Formulation: With patient Time For Goal Achievement: 01/30/14 Potential to Achieve Goals: Good  Visit Information  Last PT Received On: 01/17/14 Assistance Needed: +2 PT/OT/SLP Co-Evaluation/Treatment: Yes Reason for Co-Treatment: Complexity of the patient's impairments (multi-system involvement);For patient/therapist safety PT goals addressed during session: Mobility/safety with mobility;Strengthening/ROM History of Present Illness: 63 y.o. female with past medical history of right breast adenocarcinoma diagnoses in 04/2001 s/p chemo and RT to right breast, high-grade metastatic carcinoma, unknown primary (?ovarian origin based on this adm CT  scan), bone  and brain metastasis status post systemic chemo, was treated as breast cancer with Abraxane until 01/2013. Repeat PET scan on 03/13/2013 showed evidence for disease progression. Pt was admitted 01/13/2014 for progressive weakness, poor oral intake, weight loss.     Subjective Data  Patient Stated Goal: to get the strength back in my legs   Cognition  Cognition Arousal/Alertness: Awake/alert Behavior During Therapy: WFL for tasks assessed/performed Overall Cognitive Status: Within Functional Limits for tasks assessed    Balance  Balance Sitting-balance support: Bilateral upper extremity supported Sitting balance-Leahy Scale: Poor Sitting balance - Comments: sat on EOB x 2 minutes with heavy use of BUEs for balance, pt was in air mattress bed which didn't provide level surface on which to work on bed mobility  End of Session PT - End of Session Equipment Utilized During Treatment: Gait belt Activity Tolerance: Patient tolerated treatment well Patient left: in chair;with call bell/phone within reach Nurse Communication: Need for lift equipment;Mobility status   GP     [name] 01/17/2014, 10:32 AM [phone]

## 2014-01-17 NOTE — Telephone Encounter (Signed)
HE has been unable to reach pt . I told him she was in the [facility] hospital. He will follow up with her later to see if she qualifies for aide.

## 2014-01-17 NOTE — Progress Notes (Addendum)
[facility] PROGRESS NOTE  Debbie Larsen ENI:[id number] DOB: 1951/08/03 DOA: 01/13/2014 PCP: Melrose Nakayama, MD  Brief narrative: 63 y.o. 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 Repeat PET scan on 03/13/2013 showed evidence for disease progression. Pt was admitted 01/13/2014 for progressive weakness, poor oral intake, weight loss. Per PT recommendation is for rehab on discharge. SW is assisting the discharge plan.  Assessment/Plan:   Principal Problem:  Progressive generalized weakness, failure to thrive  - Secondary to progressive metastatic carcinoma  - Appreciate PT evaluation and their recomendations - SW to assist discharge plan likely to SNF  - Continue resource feeding supplement  - Continue supportive care with IV fluids until PO intake better  Active Problems:  Metastatic carcinoma of undetermined primary with metastases to bones and brain  - CT chest showed progression of metastatic disease within the mediastinum and lung parenchyma, increased size of the lung nodules and number of nodules, interval increase in liver metastatic disease, soft tissue mass within the pelvis on the left which could possibly reflect metastatic disease or possibly primary ovarian origin; per oncology primary possibly breast or lung  - Patient is on dexamethasone 4 mg by mouth twice a day for brain metastasis  Bone pain  - related to bone metastases  - per oncology, Zometa given 01/16/2014 - continue dexamethasone 4 mg PO BID  Hyponatremia  - Dehydration versus SIADH from malignancy  - Sodium trended up from 127 to 129  - Follow up BMP in am Hypothyroidism  - Continue synthroid 75 mcg daily  Anemia /  Thrombocytopenia  - Secondary to chronic disease secondary to malignancy and sequela of chemotherapy  - Hemoglobin 9 and platelet count 93  - Continue to monitor for signs of bleed  - Check CBC in am Elevated liver enzymes  - Likely due to metastatic disease  - AST 80, ALT 68 and ALP 167 (slightly better since admission)  Severe protein calorie malnutrition  - nutrition consulted  - continue feeding supplements  - encourage PO intake   Code Status: full code  Family Communication: no family at the bedside  Disposition Plan: remains inpatient   Consultants:  Oncology (Dr. Julien Nordmann) Procedures:  None  Antibiotics:  None   [name], MD  [facility] Pager [phone]  If 7PM-7AM, please contact night-coverage [email or web address] Password [email or web address] 01/17/2014, 9:59 AM   LOS: 4 days    HPI/Subjective: No acute overnight events.   Objective: Filed Vitals:   01/16/14 1500 01/16/14 2115 01/16/14 2330 01/17/14 0514  BP: 120/72 110/68  110/55  Pulse: 96 98  90  Temp: 100 F (37.8 C) 99.9 F (37.7 C) 98.4 F (36.9 C) 98.5 F (36.9 C)  TempSrc: Oral Oral Oral Oral  Resp: 16 18  18   Height:      Weight:      SpO2: 100% 100%  100%    Intake/Output Summary (Last 24 hours) at 01/17/14 0959 Last data filed at 01/17/14 0600  Gross per 24 hour  Intake   1917 ml  Output   3450 ml  Net  -1533 ml    Exam:   General:  Pt is alert, follows commands appropriately, not in acute distress  Cardiovascular: Regular rate and rhythm, S1/S2 appreciated  Respiratory: no wheezing, no crackles  Abdomen: Soft, non tender, non distended, bowel sounds present, no guarding  Extremities: No edema, pulses DP and PT palpable bilaterally  Neuro: B/L LE weakness  Data Reviewed: Basic Metabolic Panel:  Recent Labs Lab 01/13/14 1134 01/13/14 1745 01/14/14 0115 01/14/14 0631 01/14/14 1109 01/15/14 1120  NA 124* 129* 129* 130* 127* 129*  K 3.7  --   --  3.7  --  3.8  CL 86*  --    --  95*  --  93*  CO2 22  --   --  20  --  25  GLUCOSE 111*  --   --  115*  --  104*  BUN 7  --   --  6  --  8  CREATININE 0.60  --   --  0.59  --  0.65  CALCIUM 9.1  --   --  8.2*  --  8.8  MG  --   --   --  2.0  --   --   PHOS  --   --   --  3.1  --   --    Liver Function Tests:  Recent Labs Lab 01/13/14 1134 01/14/14 0631 01/15/14 1120  AST 70* 60* 80*  ALT 77* 65* 68*  ALKPHOS 187* 152* 167*  BILITOT 0.5 0.3 0.4  PROT 7.2 5.9* 5.9*  ALBUMIN 3.7 3.0* 3.1*   No results found for this basename: LIPASE, AMYLASE,  in the last 168 hours No results found for this basename: AMMONIA,  in the last 168 hours CBC:  Recent Labs Lab 01/13/14 1134 01/14/14 0631 01/15/14 1120  WBC 4.9 5.0 4.2  NEUTROABS 4.0  --   --   HGB 10.9* 8.9* 9.0*  HCT 30.9* 25.1* 26.0*  MCV 76.9* 77.7* 79.3  PLT 81* 82* 93*   Cardiac Enzymes: No results found for this basename: CKTOTAL, CKMB, CKMBINDEX, TROPONINI,  in the last 168 hours BNP: No components found with this basename: POCBNP,  CBG: No results found for this basename: GLUCAP,  in the last 168 hours  No results found for this or any previous visit (from the past 240 hour(s)).   Studies: No results found.  Scheduled Meds: . B-complex with vitamin C  1 tablet Oral Daily  . dexamethasone  4 mg Oral BID  . feeding supplement (PRO-STAT SUGAR FREE 64)  30 mL Oral TID WC  . feeding supplement (RESOURCE BREEZE)  1 Container Oral TID BM  . levothyroxine  75 mcg Oral QAC breakfast  . mirtazapine  7.5 mg Oral QHS  . multivitamin with minerals  1 tablet Oral Daily  . traZODone  25 mg Oral QHS   Continuous Infusions: . sodium chloride 50 mL/hr at 01/17/14 0217

## 2014-01-17 NOTE — Progress Notes (Signed)
Occupational Therapy Treatment Patient Details Name: Debbie Larsen MRN: [id number] DOB: 06/30/1951 Today's Date: 01/17/2014 Time: 0910-0959 OT Time Calculation (min): 49 min  OT Assessment / Plan / Recommendation  History of present illness 63 y.o. 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 Repeat PET scan on 03/13/2013 showed evidence for disease progression. Pt was admitted 01/13/2014 for progressive weakness, poor oral intake, weight loss.    OT comments  Discussed importance of frequent change of position including in bed. Suggested pt request assist for positioning in sidelying and then alternate to supine. Pt states she has been requesting help with this. She had an air overlay mattress but didn't like it due to noise. Pt with a regular bed again. Worked on strengthening for UEs through wheelchair propulsion, discussed AE for feeding utensils and worked on [employer] dressing. Goals amended. [place] with PT. See Mobility section for PT assist with transfers.    Follow Up Recommendations  SNF;Supervision/Assistance - 24 hour (not eligible for CIR)    Barriers to Discharge       Equipment Recommendations  3 in 1 bedside comode    Recommendations for Other Services    Frequency Min 2X/week   Progress towards OT Goals Progress towards OT goals: Progressing toward goals  Plan Discharge plan needs to be updated    Precautions / Restrictions Precautions Precautions: Fall Precaution Comments: new BLE paresis Restrictions Weight Bearing Restrictions: No   Pertinent Vitals/Pain  no complaint of    ADL  Lower Body Dressing: Performed;Moderate assistance (don socks only) Where Assessed - Lower Body Dressing:  Supine, head of bed up ADL Comments: Pt with many questions about her current condition and MD in at end of therapy session. Pt very hopeful that her strength will return in her LEs. Informed MD of patient's statement regarding her legs hopefully getting stronger to the MD. Pt on an air bed when therapy arrived and mattress very uneven so it was difficult to work on EOB sitting balance. [place]reat with PT for up to wheelchair to work on propelling own wheelchair off the unit and back to progress her strength in UEs and independence.  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 Discussed option of red foam grip for her utensil and pt would like to try this. Set goal for feeding. Pt attempted donning socks from bed level. Noted skin tear lateral side of R ankle. Nursing informed. Pt with difficulty manipulating sock to get it around her toes. Reposiioned in recliner at end of session.     OT Diagnosis:    OT Problem List:   OT Treatment Interventions:     OT Goals(current goals can now be found in the care plan section) Acute Rehab OT Goals Patient Stated Goal: to get the strength back in my legs  Visit Information  Last OT Received On: 01/17/14 Assistance Needed: +2 PT/OT/SLP Co-Evaluation/Treatment: Yes Reason for Co-Treatment: Complexity of the patient's impairments (multi-system involvement);For patient/therapist safety PT goals addressed during session: Mobility/safety with mobility;Strengthening/ROM OT goals addressed during session: ADL's and self-care;Proper use of Adaptive equipment and DME History of Present Illness: 63 y.o. female with past  medical history of right breast adenocarcinoma diagnoses in 04/2001 s/p chemo and RT to right breast, high-grade metastatic carcinoma, unknown primary (?ovarian origin based on this adm CT scan), bone and brain metastasis status post systemic  chemo, was treated as breast cancer with Abraxane until 01/2013. Repeat PET scan on 03/13/2013 showed evidence for disease progression. Pt was admitted 01/13/2014 for progressive weakness, poor oral intake, weight loss.     Subjective Data      Prior Functioning       Cognition  Cognition Arousal/Alertness: Awake/alert Behavior During Therapy: WFL for tasks assessed/performed Overall Cognitive Status: Within Functional Limits for tasks assessed    Mobility  Bed Mobility Overal bed mobility: Needs Assistance Bed Mobility: Rolling;Sidelying to Sit Rolling: Min assist Sidelying to sit: Mod assist General bed mobility comments: assist for supine to sit, total assist to advance BLEs, mod A to elevate trunk, verbal cues for technique Transfers Overall transfer level: Needs assistance Stand pivot transfers: Total assist General transfer comment: bed to WC, then WC to recliner; pt unable to bear any weight through BLEs as they are flaccid       Balance Balance Sitting-balance support: Bilateral upper extremity supported Sitting balance-Leahy Scale: Poor Sitting balance - Comments: sat on EOB x 2 minutes with heavy use of BUEs for balance, pt was in air mattress bed which didn't provide level surface on which to work on bed mobility  End of Session OT - End of Session Activity Tolerance: Patient limited by fatigue Patient left: in chair;with call bell/phone within reach  GO     Jules Schick [phone] 01/17/2014, 1:23 PM

## 2014-01-17 NOTE — Progress Notes (Signed)
Clinical Social Work Department BRIEF PSYCHOSOCIAL ASSESSMENT 01/17/2014  [name], Debbie Larsen     Account Number:  [id number]     Admit date:  01/13/2014  Clinical Social Worker:  Ulyess Blossom  Date/Time:  01/17/2014 10:30 AM  Referred by:  Physician  Date Referred:  01/17/2014 Referred for  SNF Placement   Other Referral:   Interview type:  Patient Other interview type:    PSYCHOSOCIAL DATA Living Status:  ALONE Admitted from facility:   Level of care:   Primary support name:  Oddie Murray/[phone] Primary support relationship to patient:  FRIEND Degree of support available:   adequate    CURRENT CONCERNS Current Concerns  Post-Acute Placement   Other Concerns:    SOCIAL WORK ASSESSMENT / PLAN CSW received referral that pt was evaluated by CIR, but CIR does not feel that pt is appropriate and pt needing SNF placement.    CSW reviewed chart and noted that pt has Medicaid only.    CSW met with pt at bedside. CSW introduced self and explained role. Pt discussed that she lives alone, but has assistance from neighbors. Pt was aware that CIR did not feel she was appropriate. CSW discussed options for SNF and explained that there may not be a lot of options given pt Medicaid only payer status. Pt agreeable to SNF search, but feels that she needs more time in the hospital to get "stronger". CSW counseled pt that SNF facility recommendation is for pt to continue with working to get stronger and once pt does not meet medical acuity in the hospital and SNF bed available then pt has to transition to SNF.  Although pt expressed understanding she continued to say that she needs to regain some strength before leaving the hospital and CSW is unsure that pt grasp that she is very close to being able to leave the hospital even after continued explanation.    Pt agreeable to SNF search in [facility] and prefers to be close to her home as pt neighbors are primary support.  CSW validated pt feelings of wanting to be close to home, but re-educated pt regarding some limitations having Medicaid only and there is no guarantee that facility is going to be close to pt home.    CSW completed FL2 and initiated SNF search to [facility].    CSW to follow up with pt re: SNF bed offers.    CSW to continue to follow and assist with pt disposition needs.   Assessment/plan status:  Psychosocial Support/Ongoing Assessment of Needs Other assessment/ plan:   discharge planning   Information/referral to community resources:   [facility] list.    PATIENT'S/FAMILY'S RESPONSE TO PLAN OF CARE: Pt alert and oriented x 4. Pt engaged in conversation and motivated to continue physical therapy. Pt appears to be not completely comprehending that she is nearing discharge and made aware that once acuity of care not met in hospital and SNF bed available then pt will need to transition to SNF.    Alison Murray, MSW, [place] Work [phone]

## 2014-01-17 NOTE — Progress Notes (Addendum)
Clinical Social Work Department CLINICAL SOCIAL WORK PLACEMENT NOTE 01/17/2014  Patient:  NAHAL, WANLESS  Account Number:  [id number] [place] date:  01/13/2014  Clinical Social Worker:  Ulyess Blossom  Date/time:  01/17/2014 11:00 AM  Clinical Social Work is seeking post-discharge placement for this patient at the following level of care:   SKILLED NURSING   (*CSW will update this form in Epic as items are completed)   01/17/2014  Patient/family provided with [place] Department of Clinical Social Work's list of facilities offering this level of care within the geographic area requested by the patient (or if unable, by the patient's family).  01/17/2014  Patient/family informed of their freedom to choose among providers that offer the needed level of care, that participate in Medicare, Medicaid or managed care program needed by the patient, have an available bed and are willing to accept the patient.  01/17/2014  Patient/family informed of [facility]' ownership interest in [facility], as well as of the fact that they are under no obligation to receive care at this facility.  PASARR submitted to EDS on 01/17/2014 PASARR number received from EDS on 01/17/2014  FL2 transmitted to all facilities in geographic area requested by pt/family on  01/17/2014 FL2 transmitted to all facilities within larger geographic area on   Patient informed that his/her managed care company has contracts with or will negotiate with  certain facilities, including the following:     Patient/family informed of bed offers received:  01/17/2014 Patient chooses bed at [facility] Physician recommends and patient chooses bed at    Patient to be transferred to  on  [facility] on 01/28/2014 Patient to be transferred to facility by ambulance Corey Harold)  The following physician request were entered in Epic:   Additional Comments:   Alison Murray, MSW, [place]  Work [phone]

## 2014-01-17 NOTE — Progress Notes (Signed)
Rehab admissions - Evaluated for possible admission.  Please see rehab MD consult done by Dr. Letta Pate recommending SNF.  Patient lacks the social supports for an inpatient rehab admission.  Lives alone and very unlikely that patient can return to independent living.  Call me for questions.  #[phone]

## 2014-01-18 DIAGNOSIS — D638 Anemia in other chronic diseases classified elsewhere: Secondary | ICD-10-CM

## 2014-01-18 NOTE — Progress Notes (Signed)
Physical Therapy Treatment Patient Details Name: Debbie Larsen MRN: [id number] DOB: 1951-05-01 Today's Date: 01/18/2014 Time: 0910-0949 PT Time Calculation (min): 39 min  PT Assessment / Plan / Recommendation  History of Present Illness 63 y.o. 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 Repeat PET scan on 03/13/2013 showed evidence for disease progression. Pt was admitted 01/13/2014 for progressive weakness, poor oral intake, weight loss.    PT Comments   Pt requesting ROM exercises for LEs. Performed PROM exercises at pt's request. Pt with many questions about condition-instructed pt to direct questions regarding cause of weakness/liklihood of strength returning to hospitalist/oncologist. Noted pt in fetal position at start of session. Throughout session, educated on importance of positioning of LEs/position changes while in bed. Although pt acknowledged this, she continues to request and insist on keeping bil LEs in flexed positioning. Made nurse techs aware of this as well and asked them to encourage position changes every few hours. Pt still unable to move LEs. Will need SNF. Made pt aware that PT will not be back to see her until Monday-pt agreeable to this.   Follow Up Recommendations  SNF     Does the patient have the potential to tolerate intense rehabilitation     Barriers to Discharge        Equipment Recommendations       Recommendations for Other Services OT consult  Frequency     Progress towards PT Goals Progress towards PT goals: Progressing toward goals  Plan Current plan remains appropriate    Precautions / Restrictions Precautions Precautions: Fall Precaution Comments: new BLE weakness-no  movement-paraplegia??? Restrictions Weight Bearing Restrictions: No   Pertinent Vitals/Pain No c/o pain    Mobility  Bed Mobility Overal bed mobility: Needs Assistance Bed Mobility: Rolling Rolling: Min assist General bed mobility comments: Rolled into sidelying for position change. Assist to move/position bil LEs.     Exercises General Exercises - Lower Extremity Ankle Circles/Pumps: PROM;Both;10 reps;Supine Heel Slides: PROM;Both;10 reps;Supine Hip ABduction/ADduction: PROM;Both;10 reps;Supine Other Exercises Other Exercises: hip ER/IR, 90-90, supine, 10 reps   PT Diagnosis:    PT Problem List:   PT Treatment Interventions:     PT Goals (current goals can now be found in the care plan section)    Visit Information  Last PT Received On: 01/18/14 Assistance Needed: +2 History of Present Illness: 63 y.o. 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 Repeat PET scan on 03/13/2013 showed evidence for disease progression. Pt was admitted 01/13/2014 for progressive weakness, poor oral intake, weight loss.     Subjective Data      Cognition  Cognition Arousal/Alertness: Awake/alert Behavior During Therapy: WFL for tasks assessed/performed Overall Cognitive Status: Within Functional Limits for tasks assessed    Balance     End of Session PT - End of Session Activity Tolerance: Patient tolerated treatment well Patient left: in bed;with call bell/phone within reach Nurse Communication: Need for lift equipment   GP     Weston Anna, MPT Pager: [phone]

## 2014-01-18 NOTE — Progress Notes (Signed)
[name]   DOB:12/26/1950   JQ#:[id number]   WIO#:[id number]  Subjective: patient states she is doing well. She is having weakness in her legs and remains concerned. She also is very much interested in being aggressive regarding her treatments. No other complaints are voiced to me this morning.   Objective:  Filed Vitals:   01/18/14 0400  BP: 110/64  Pulse: 89  Temp: 98.6 F (37 C)  Resp: 16    Body mass index is 20.57 kg/(m^2).  Intake/Output Summary (Last 24 hours) at 01/18/14 1413 Last data filed at 01/18/14 0400  Gross per 24 hour  Intake   1330 ml  Output   3000 ml  Net  -1670 ml     Sclerae unicteric  Oropharynx clear  No peripheral adenopathy  Lungs clear - decreased breath sounds  Heart regular rate and rhythm  Abdomen benign, BS+  MSK no focal spinal tenderness, no peripheral edema  Neuro nonfocal  Breast exam: deferred  CBG (last 3)  No results found for this basename: GLUCAP,  in the last 72 hours   Labs:  Lab Results  Component Value Date   WBC 4.2 01/15/2014   HGB 9.0* 01/15/2014   HCT 26.0* 01/15/2014   MCV 79.3 01/15/2014   PLT 93* 01/15/2014   NEUTROABS 4.0 01/13/2014    Urine Studies No results found for this basename: UACOL, UAPR, USPG, UPH, UTP, UGL, UKET, UBIL, UHGB, UNIT, UROB, ULEU, UEPI, UWBC, URBC, UBAC, CAST, CRYS, UCOM, BILUA,  in the last 72 hours  Basic Metabolic Panel:  Recent Labs Lab 01/13/14 1134 01/13/14 1745 01/14/14 0115 01/14/14 0631 01/14/14 1109 01/15/14 1120  NA 124* 129* 129* 130* 127* 129*  K 3.7  --   --  3.7  --  3.8  CL 86*  --   --  95*  --  93*  CO2 22  --   --  20  --  25  GLUCOSE 111*  --   --  115*  --  104*  BUN 7  --   --  6  --  8  CREATININE 0.60  --   --  0.59  --  0.65  CALCIUM 9.1  --   --  8.2*  --  8.8  MG  --   --   --  2.0  --   --   PHOS  --   --   --  3.1  --   --    GFR Estimated Creatinine Clearance: 52.4 ml/min (by C-G formula based on Cr of 0.65). Liver Function Tests:  Recent  Labs Lab 01/13/14 1134 01/14/14 0631 01/15/14 1120  AST 70* 60* 80*  ALT 77* 65* 68*  ALKPHOS 187* 152* 167*  BILITOT 0.5 0.3 0.4  PROT 7.2 5.9* 5.9*  ALBUMIN 3.7 3.0* 3.1*   No results found for this basename: LIPASE, AMYLASE,  in the last 168 hours No results found for this basename: AMMONIA,  in the last 168 hours Coagulation profile No results found for this basename: INR, PROTIME,  in the last 168 hours  CBC:  Recent Labs Lab 01/13/14 1134 01/14/14 0631 01/15/14 1120  WBC 4.9 5.0 4.2  NEUTROABS 4.0  --   --   HGB 10.9* 8.9* 9.0*  HCT 30.9* 25.1* 26.0*  MCV 76.9* 77.7* 79.3  PLT 81* 82* 93*   Cardiac Enzymes: No results found for this basename: CKTOTAL, CKMB, CKMBINDEX, TROPONINI,  in the last 168 hours BNP: No components found  with this basename: POCBNP,  CBG: No results found for this basename: GLUCAP,  in the last 168 hours D-Dimer No results found for this basename: DDIMER,  in the last 72 hours Hgb A1c No results found for this basename: HGBA1C,  in the last 72 hours Lipid Profile No results found for this basename: CHOL, HDL, LDLCALC, TRIG, CHOLHDL, LDLDIRECT,  in the last 72 hours Thyroid function studies No results found for this basename: TSH, T4TOTAL, FREET3, T3FREE, THYROIDAB,  in the last 72 hours Anemia work up No results found for this basename: VITAMINB12, FOLATE, FERRITIN, TIBC, IRON, RETICCTPCT,  in the last 72 hours Microbiology No results found for this or any previous visit (from the past 240 hour(s)).    Studies:  No results found.  Assessment/Plan: 63 y.o.  With  1. Hx of brest cancer diagnosed in 2002 she has been treated by Dr. Jana Hakim  2. 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  3. Brain metatases: s/p brain radiation.  4. Anemia: likely multifactorial, likely due to underlying  malignancy and chronic disease, asymptomatic, we will monitor. 5. Chronic hyponatremia: asymptomatic, continue to monitor  6. We will continue to follow with you   Marcy Panning 01/18/2014

## 2014-01-18 NOTE — Progress Notes (Signed)
Occupational Therapy Treatment Patient Details Name: Debbie Larsen MRN: [id number] DOB: 29-Aug-1951 Today's Date: 01/18/2014 Time: 6213-0865 OT Time Calculation (min): 13 min  OT Assessment / Plan / Recommendation  History of present illness 63 y.o. 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 Repeat PET scan on 03/13/2013 showed evidence for disease progression. Pt was admitted 01/13/2014 for progressive weakness, poor oral intake, weight loss.    OT comments  Patient reports she does not usually have difficulty feeding herself but was thankful for red foam build-up. I explained that this could be used on other feeding utensils, toothbrush, pen, etc. Patient would like therapy ball to squeeze with hands. I was unable to locate one to give to patient this date.    Follow Up Recommendations  SNF;Supervision/Assistance - 24 hour    Barriers to Discharge       Equipment Recommendations  3 in 1 bedside comode    Recommendations for Other Services    Frequency Min 2X/week   Progress towards OT Goals Progress towards OT goals: Progressing toward goals  Plan      Precautions / Restrictions Precautions Precautions: Fall Precaution Comments: new BLE paresis Restrictions Weight Bearing Restrictions: No   Pertinent Vitals/Pain No c/o pain    ADL  Eating/Feeding: Simulated;Set up (using new red foam build-up on spoon) Where Assessed - Eating/Feeding: Bed level ADL Comments: Patient thankful for red foam build-up but really wants therapy ball. This writer was unable to locate therapy ball this date to give to pt. Patient informed of this. Patient had multiple questions about PT and when they were going to "come  stretch my legs." Passed this information on to one of the PTs.    OT Diagnosis:    OT Problem List:   OT Treatment Interventions:     OT Goals(current goals can now be found in the care plan section)    Visit Information  Last OT Received On: 01/18/14 History of Present Illness: 63 y.o. 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 Repeat PET scan on 03/13/2013 showed evidence for disease progression. Pt was admitted 01/13/2014 for progressive weakness, poor oral intake, weight loss.     Subjective Data      Prior Functioning       Cognition  Cognition Arousal/Alertness: Awake/alert Behavior During Therapy: WFL for tasks assessed/performed Overall Cognitive Status: Within Functional Limits for tasks assessed    Mobility       Exercises      Balance    End of Session OT - End of Session Equipment Utilized During Treatment: Other (comment) (red foam build-up for eating utensils) Activity Tolerance: Patient tolerated treatment well Patient left: in bed;with call bell/phone within reach  GO     Wynston Romey A 01/18/2014, 9:09 AM

## 2014-01-18 NOTE — Progress Notes (Signed)
[facility] PROGRESS NOTE  Debbie Larsen NLG:[id number] DOB: 07-31-1951 DOA: 01/13/2014 PCP: Melrose Nakayama, MD  Brief narrative: 63 y.o. 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 Repeat PET scan on 03/13/2013 showed evidence for disease progression. Pt was admitted 01/13/2014 for progressive weakness, poor oral intake, weight loss. Per PT recommendation is for rehab on discharge. SW is assisting the discharge plan.   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  - CT chest showed progression of metastatic disease within the mediastinum and lung parenchyma, increased size of the lung nodules and number of nodules, interval increase in liver metastatic disease, soft tissue mass within the pelvis on the left which could possibly reflect metastatic disease or possibly primary ovarian origin; per oncology primary possibly breast or lung  - Patient is on dexamethasone 4 mg by mouth twice a day for brain metastasis  Bone pain  - related to bone metastases  - per oncology, Zometa given 01/16/2014  - continue dexamethasone 4 mg PO BID  - her pain is somewhat better but she has no strength in legs at all Hyponatremia  - Dehydration versus SIADH from malignancy  - Sodium trended up from 127 to 129  -  Follow up BMP in am  Hypothyroidism  - Continue synthroid 75 mcg daily  Anemia / Thrombocytopenia  - Secondary to chronic disease secondary to malignancy and sequela of chemotherapy  - Hemoglobin 9 and platelet count 93  - no signs of bleed; no current indications for transfusion  - Check CBC in am  Elevated liver enzymes  - Likely due to metastatic disease  - AST 80, ALT 68 and ALP 167 (slightly better since admission)  Severe protein calorie malnutrition  - nutrition consulted  - continue feeding supplements  - encourage PO intake   Code Status: full code  Family Communication: no family at the bedside  Disposition Plan: remains inpatient; SNF discharge Monday   Consultants:  Oncology (Dr. Julien Nordmann) Rad oncology  Procedures:  None  Antibiotics:  None    [name], MD  [facility] Pager [phone]  If 7PM-7AM, please contact night-coverage [email or web address] Password [email or web address] 01/18/2014, 9:01 AM   LOS: 5 days    HPI/Subjective: No acute overnight events.   Objective: Filed Vitals:   01/16/14 2330 01/17/14 0514 01/17/14 1430 01/18/14 0400  BP:  110/55 120/60 110/64  Pulse:  90 92 89  Temp: 98.4 F (36.9 C) 98.5 F (36.9 C) 98.6 F (37 C) 98.6 F (37 C)  TempSrc: Oral Oral Oral Oral  Resp:  18 18 16   Height:      Weight:      SpO2:  100% 100% 100%    Intake/Output Summary (Last 24 hours) at 01/18/14 0901 Last data filed at 01/18/14 0400  Gross per 24 hour  Intake   1570 ml  Output  4000 ml  Net  -2430 ml    Exam:   General:  Pt is alert, follows commands appropriately, not in acute distress  Cardiovascular: Regular rate and rhythm, S1/S2, no murmurs, no rubs, no gallops  Respiratory: Clear to auscultation bilaterally, no wheezing, no crackles, no rhonchi  Abdomen: Soft, non tender, non distended, bowel sounds present, no guarding  Extremities: No edema, pulses DP and PT palpable bilaterally  Neuro: alert, awake, B/L LE weakness  Data  Reviewed: Basic Metabolic Panel:  Recent Labs Lab 01/13/14 1134 01/13/14 1745 01/14/14 0115 01/14/14 0631 01/14/14 1109 01/15/14 1120  NA 124* 129* 129* 130* 127* 129*  K 3.7  --   --  3.7  --  3.8  CL 86*  --   --  95*  --  93*  CO2 22  --   --  20  --  25  GLUCOSE 111*  --   --  115*  --  104*  BUN 7  --   --  6  --  8  CREATININE 0.60  --   --  0.59  --  0.65  CALCIUM 9.1  --   --  8.2*  --  8.8  MG  --   --   --  2.0  --   --   PHOS  --   --   --  3.1  --   --    Liver Function Tests:  Recent Labs Lab 01/13/14 1134 01/14/14 0631 01/15/14 1120  AST 70* 60* 80*  ALT 77* 65* 68*  ALKPHOS 187* 152* 167*  BILITOT 0.5 0.3 0.4  PROT 7.2 5.9* 5.9*  ALBUMIN 3.7 3.0* 3.1*   No results found for this basename: LIPASE, AMYLASE,  in the last 168 hours No results found for this basename: AMMONIA,  in the last 168 hours CBC:  Recent Labs Lab 01/13/14 1134 01/14/14 0631 01/15/14 1120  WBC 4.9 5.0 4.2  NEUTROABS 4.0  --   --   HGB 10.9* 8.9* 9.0*  HCT 30.9* 25.1* 26.0*  MCV 76.9* 77.7* 79.3  PLT 81* 82* 93*   Cardiac Enzymes: No results found for this basename: CKTOTAL, CKMB, CKMBINDEX, TROPONINI,  in the last 168 hours BNP: No components found with this basename: POCBNP,  CBG: No results found for this basename: GLUCAP,  in the last 168 hours  No results found for this or any previous visit (from the past 240 hour(s)).   Studies: No results found.  Scheduled Meds: . B-complex with vitamin C  1 tablet Oral Daily  . dexamethasone  4 mg Oral BID  . feeding supplemen  30 mL Oral TID WC  . feeding supplement  1 Container Oral TID BM  . levothyroxine  75 mcg Oral QAC breakfast  . mirtazapine  7.5 mg Oral QHS  . multivitamin   1 tablet Oral Daily  . traZODone  25 mg Oral QHS   Continuous Infusions: . sodium chloride 50 mL/hr at 01/17/14 2130

## 2014-01-19 LAB — CBC
HEMATOCRIT: 23.4 % — AB (ref 36.0–46.0)
Hemoglobin: 7.9 g/dL — ABNORMAL LOW (ref 12.0–15.0)
MCH: 27.2 pg (ref 26.0–34.0)
MCHC: 33.8 g/dL (ref 30.0–36.0)
MCV: 80.7 fL (ref 78.0–100.0)
Platelets: 99 10*3/uL — ABNORMAL LOW (ref 150–400)
RBC: 2.9 MIL/uL — ABNORMAL LOW (ref 3.87–5.11)
RDW: 16.2 % — ABNORMAL HIGH (ref 11.5–15.5)
WBC: 4.5 10*3/uL (ref 4.0–10.5)

## 2014-01-19 LAB — BASIC METABOLIC PANEL
BUN: 6 mg/dL (ref 6–23)
CHLORIDE: 101 meq/L (ref 96–112)
CO2: 22 mEq/L (ref 19–32)
Calcium: 8.2 mg/dL — ABNORMAL LOW (ref 8.4–10.5)
Creatinine, Ser: 0.53 mg/dL (ref 0.50–1.10)
GFR calc Af Amer: >90 mL/min (ref >90)
GFR calc non Af Amer: >90 mL/min (ref >90)
Glucose, Bld: 139 mg/dL — ABNORMAL HIGH (ref 70–99)
Potassium: 4.1 mEq/L (ref 3.7–5.3)
Sodium: 134 mEq/L — ABNORMAL LOW (ref 137–147)

## 2014-01-19 LAB — PREPARE RBC (CROSSMATCH)

## 2014-01-19 LAB — ABO/RH: ABO/RH(D): B POS

## 2014-01-19 NOTE — Progress Notes (Signed)
Debbie Larsen   DOB:November 27, 1950   ZO#:[id number]   JWJ#:[id number]  Subjective: Patient is about the same. Her forte she is getting weaker. She had her brother visiting from [place]. I spoke to him over the phone for about 40 minutes. Apparently she has not told him anything that have been happening to her until about 2 weeks ago. He had a lot of questions for me regarding patient's further treatment. I have deferred that to Dr. Earlie Server who is patient's primary oncologist. We discussed in general about patient's prognosis and her disease process. His brother was very grateful for be speaking to them. Patient still wants to be aggressive. She is going to speak to Dr. Earlie Server regarding this  Objective:  Filed Vitals:   01/19/14 1951  BP: 124/70  Pulse: 95  Temp: 98.6 F (37 C)  Resp: 16    Body mass index is 20.57 kg/(m^2).  Intake/Output Summary (Last 24 hours) at 01/19/14 2002 Last data filed at 01/19/14 1935  Gross per 24 hour  Intake 2622.5 ml  Output   4725 ml  Net -2102.5 ml     Sclerae unicteric  Oropharynx clear  No peripheral adenopathy  Lungs clear - decreased breath sounds  Heart regular rate and rhythm  Abdomen benign, BS+  MSK no focal spinal tenderness, no peripheral edema  Breast exam: deferred  CBG (last 3)  No results found for this basename: GLUCAP,  in the last 72 hours   Labs:  Lab Results  Component Value Date   WBC 4.5 01/19/2014   HGB 7.9* 01/19/2014   HCT 23.4* 01/19/2014   MCV 80.7 01/19/2014   PLT 99* 01/19/2014   NEUTROABS 4.0 01/13/2014    Urine Studies No results found for this basename: UACOL, UAPR, USPG, UPH, UTP, UGL, UKET, UBIL, UHGB, UNIT, UROB, ULEU, UEPI, UWBC, URBC, UBAC, CAST, CRYS, UCOM, BILUA,  in the last 72 hours  Basic Metabolic Panel:  Recent Labs Lab 01/13/14 1134  01/14/14 0115 01/14/14 0631 01/14/14 1109 01/15/14 1120 01/19/14 0540  NA 124*  < > 129* 130* 127* 129* 134*  K 3.7  --   --  3.7  --  3.8 4.1  CL 86*   --   --  95*  --  93* 101  CO2 22  --   --  20  --  25 22  GLUCOSE 111*  --   --  115*  --  104* 139*  BUN 7  --   --  6  --  8 6  CREATININE 0.60  --   --  0.59  --  0.65 0.53  CALCIUM 9.1  --   --  8.2*  --  8.8 8.2*  MG  --   --   --  2.0  --   --   --   PHOS  --   --   --  3.1  --   --   --   < > = values in this interval not displayed. GFR Estimated Creatinine Clearance: 52.4 ml/min (by C-G formula based on Cr of 0.53). Liver Function Tests:  Recent Labs Lab 01/13/14 1134 01/14/14 0631 01/15/14 1120  AST 70* 60* 80*  ALT 77* 65* 68*  ALKPHOS 187* 152* 167*  BILITOT 0.5 0.3 0.4  PROT 7.2 5.9* 5.9*  ALBUMIN 3.7 3.0* 3.1*   No results found for this basename: LIPASE, AMYLASE,  in the last 168 hours No results found for this basename: AMMONIA,  in  the last 168 hours Coagulation profile No results found for this basename: INR, PROTIME,  in the last 168 hours  CBC:  Recent Labs Lab 01/13/14 1134 01/14/14 0631 01/15/14 1120 01/19/14 0540  WBC 4.9 5.0 4.2 4.5  NEUTROABS 4.0  --   --   --   HGB 10.9* 8.9* 9.0* 7.9*  HCT 30.9* 25.1* 26.0* 23.4*  MCV 76.9* 77.7* 79.3 80.7  PLT 81* 82* 93* 99*   Cardiac Enzymes: No results found for this basename: CKTOTAL, CKMB, CKMBINDEX, TROPONINI,  in the last 168 hours BNP: No components found with this basename: POCBNP,  CBG: No results found for this basename: GLUCAP,  in the last 168 hours D-Dimer No results found for this basename: DDIMER,  in the last 72 hours Hgb A1c No results found for this basename: HGBA1C,  in the last 72 hours Lipid Profile No results found for this basename: CHOL, HDL, LDLCALC, TRIG, CHOLHDL, LDLDIRECT,  in the last 72 hours Thyroid function studies No results found for this basename: TSH, T4TOTAL, FREET3, T3FREE, THYROIDAB,  in the last 72 hours Anemia work up No results found for this basename: VITAMINB12, FOLATE, FERRITIN, TIBC, IRON, RETICCTPCT,  in the last 72 hours Microbiology No results  found for this or any previous visit (from the past 240 hour(s)).    Studies:  No results found.  Assessment/Plan: 63 y.o.  With  1. Hx of brest cancer diagnosed in 2002 she has been treated by Dr. Jana Hakim  2. 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  3. Brain metatases: s/p brain radiation.  4. Anemia: Patient may benefit from a blood transfusion today.  5. Chronic hyponatremia: Improved with sodium 134 and patient remains asymptomatic, continue to monitor  6. We will continue to follow with you . Patient will be seen by Dr. Earlie Server on Monday, 01/20/2014  I spent 20 minutes counseling the patient face to face. The total time spent in the appointment was 60 minutes in  counseling, evaluation, examination, and coordination of care    [facility], Bernell List 01/19/2014

## 2014-01-19 NOTE — Progress Notes (Signed)
[facility] PROGRESS NOTE  Debbie Larsen HFW:[id number] DOB: 1951/07/08 DOA: 01/13/2014 PCP: Melrose Nakayama, MD  Brief narrative: 63 y.o. 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 Repeat PET scan on 03/13/2013 showed evidence for disease progression. Pt was admitted 01/13/2014 for progressive weakness, poor oral intake, weight loss. Per PT recommendation is for rehab on discharge. SW is assisting the discharge plan.   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  - CT chest showed progression of metastatic disease within the mediastinum and lung parenchyma, increased size of the lung nodules and number of nodules, interval increase in liver metastatic disease, soft tissue mass within the pelvis on the left which could possibly reflect metastatic disease or possibly primary ovarian origin; per oncology primary possibly breast or lung  - Patient is on dexamethasone 4 mg by mouth twice a day for brain metastasis  Bone pain  - related to bone metastases  - per oncology, Zometa given 01/16/2014  - continue dexamethasone 4 mg PO BID  - her pain is somewhat better but she has no strength in legs at all  Hyponatremia  - Dehydration versus SIADH from malignancy  - Sodium trended up from 127 to 129 to  134 Hypothyroidism  - Continue synthroid 75 mcg daily  Anemia / Thrombocytopenia  - Secondary to chronic disease secondary to malignancy and sequela of chemotherapy  - Hemoglobin 7.9 this am and platelet count 99; will give 1 unit PRBC today Elevated liver enzymes  - Likely due to metastatic disease  - AST 80, ALT 68 and ALP 167 (slightly better since admission)  Severe protein calorie malnutrition  - nutrition consulted  - continue feeding supplements  - encourage PO intake   Code Status: full code  Family Communication: family at the bedside; updated pt brother  Disposition Plan: remains inpatient; SNF discharge Monday   Consultants:  Oncology (Dr. Julien Nordmann)  Rad oncology  Procedures:  None  Antibiotics:  None    [name], MD  [facility] Pager [phone]  If 7PM-7AM, please contact night-coverage [email or web address] Password [facility] 01/19/2014, 7:29 AM   LOS: 6 days    HPI/Subjective: No overnight events.   Objective: Filed Vitals:   01/18/14 0400 01/18/14 1426 01/18/14 2000 01/19/14 0641  BP: 110/64 118/58 122/60 118/62  Pulse: 89 95 99 93  Temp: 98.6 F (37 C) 98.2 F (36.8 C) 99.2 F (37.3 C) 99.3 F (37.4 C)  TempSrc: Oral Oral Axillary Axillary  Resp: 16 16 20 16   Height:      Weight:      SpO2: 100% 100% 100% 100%    Intake/Output Summary (Last 24 hours) at 01/19/14 0729 Last data filed at 01/19/14 8786  Gross per 24 hour  Intake   1440 ml  Output   5125 ml  Net  -3685 ml    Exam:  General:  Pt is alert, follows commands appropriately, not in acute distress  Cardiovascular: Regular rate and rhythm, S1/S2, no murmurs, no rubs, no gallops  Respiratory: Clear to auscultation bilaterally, no wheezing, no crackles, no rhonchi  Abdomen: Soft, non tender, non distended, bowel sounds present, no guarding  Extremities: No edema, pulses DP and PT palpable bilaterally  Neuro: B/L LE weakness  Data Reviewed: Basic Metabolic  Panel:  Recent Labs Lab 01/13/14 1134  01/14/14 0115 01/14/14 0631 01/14/14 1109 01/15/14 1120 01/19/14 0540  NA 124*  < > 129* 130* 127* 129* 134*  K 3.7  --   --  3.7  --  3.8 4.1  CL 86*  --   --  95*  --  93* 101  CO2 22  --   --  20  --  25 22  GLUCOSE 111*  --   --  115*  --  104* 139*  BUN 7  --   --  6  --  8 6  CREATININE 0.60  --   --  0.59  --  0.65 0.53  CALCIUM 9.1  --   --  8.2*  --  8.8 8.2*  MG  --   --   --  2.0  --   --   --   PHOS  --   --   --  3.1  --   --   --   < > = values in this interval not displayed. Liver Function Tests:  Recent Labs Lab 01/13/14 1134 01/14/14 0631 01/15/14 1120  AST 70* 60* 80*  ALT 77* 65* 68*  ALKPHOS 187* 152* 167*  BILITOT 0.5 0.3 0.4  PROT 7.2 5.9* 5.9*  ALBUMIN 3.7 3.0* 3.1*   No results found for this basename: LIPASE, AMYLASE,  in the last 168 hours No results found for this basename: AMMONIA,  in the last 168 hours CBC:  Recent Labs Lab 01/13/14 1134 01/14/14 0631 01/15/14 1120 01/19/14 0540  WBC 4.9 5.0 4.2 4.5  NEUTROABS 4.0  --   --   --   HGB 10.9* 8.9* 9.0* 7.9*  HCT 30.9* 25.1* 26.0* 23.4*  MCV 76.9* 77.7* 79.3 80.7  PLT 81* 82* 93* 99*   Cardiac Enzymes: No results found for this basename: CKTOTAL, CKMB, CKMBINDEX, TROPONINI,  in the last 168 hours BNP: No components found with this basename: POCBNP,  CBG: No results found for this basename: GLUCAP,  in the last 168 hours  No results found for this or any previous visit (from the past 240 hour(s)).   Studies: No results found.  Scheduled Meds: . B-complex with vitamin C  1 tablet Oral Daily  . dexamethasone  4 mg Oral BID  . feeding supplement (PRO-STAT SUGAR FREE 64)  30 mL Oral TID WC  . feeding supplement (RESOURCE BREEZE)  1 Container Oral TID BM  . levothyroxine  75 mcg Oral QAC breakfast  . mirtazapine  7.5 mg Oral QHS  . multivitamin with minerals  1 tablet Oral Daily  . traZODone  25 mg Oral QHS   Continuous Infusions: .  sodium chloride 50 mL/hr at 01/18/14 1952

## 2014-01-19 NOTE — Progress Notes (Signed)
Lengthy conversation had with brother at brother's request.  Brother just made aware of Pt's medical situation approx 10 days ago.  Brother upset that he didn't know more about Pt's situation and feels guilty for not being here sooner.  Brother wanting Pt to execute a HCPOA and medical power of attorney.   CSW and brother talked about how to execute these and brother given HCPOA documents; he will discuss this with Pt, as Pt is alert and oriented.  Pt's brother stated that he's heard negative things about the [facility] and would like other facilities explored.  CSW explained that other facilities were explored and that Pt is limited due to insurance; the [facility] are her only options.  Pt's brother to review HCPOA document with Pt.  CSW will ask [facility] CSW to touch base with Pt and offer assistance in executing this document, if Pt desires.  CSW offered emotional support and thanked Pt's brother for his time.  Bernita Raisin, [place] Work [phone]

## 2014-01-20 ENCOUNTER — Ambulatory Visit
Admit: 2014-01-20 | Discharge: 2014-01-20 | Disposition: A | Discharge status: Home / Self Care | Payer: Medicaid Other | Attending: Radiation Oncology | Admitting: Radiation Oncology

## 2014-01-20 ENCOUNTER — Inpatient Hospital Stay (HOSPITAL_COMMUNITY): Payer: Medicaid Other

## 2014-01-20 DIAGNOSIS — C7931 Secondary malignant neoplasm of brain: Secondary | ICD-10-CM

## 2014-01-20 DIAGNOSIS — C7949 Secondary malignant neoplasm of other parts of nervous system: Principal | ICD-10-CM

## 2014-01-20 LAB — TYPE AND SCREEN
ABO/RH(D): B POS
Antibody Screen: NEGATIVE
Unit division: 0
Unit division: 0

## 2014-01-20 MED ORDER — ZOLPIDEM TARTRATE 5 MG PO TABS
5.0000 mg | ORAL_TABLET | Freq: Every day | ORAL | Status: DC
  Administered 2014-01-21 – 2014-01-22 (×2): 5 mg via ORAL
  Filled 2014-01-20 (×3): qty 1

## 2014-01-20 MED ORDER — GADOBENATE DIMEGLUMINE 529 MG/ML IV SOLN
10.0000 mL | Freq: Once | INTRAVENOUS | Status: AC | PRN
  Administered 2014-01-20: 9 mL via INTRAVENOUS

## 2014-01-20 NOTE — Progress Notes (Addendum)
CSW received notification from [facility] that pt completed an Advanced Directive in the outpatient setting and pt Advanced Directive can be found under media tab. CSW printed Advanced Directives and placed in shadow chart as well.   Pt designated pt brother as Oncologist.  CSW to follow up with pt today to continue discussion about disposition plans. Per MD, pt not medically ready for discharge today.  Addendum 3:30 pm:  CSW met with pt at bedside. Pt brother present at this time. CSW clarified pt and pt brothers questions regarding Advanced Directives/disposition planning. CSW provided copy of HCPOA to pt brother with pt permission. CSW noticed that living will was not completed when [facility] was completed. CSW discussed with pt and pt wishing to complete living will at this time.  CSW confirmed information for healthcare power of attorney and assisted pt in completion of living will.   CSW arranged two witnesses and notary and advanced directive packet was notarized. CSW voided previous advanced directive and placed new completed advanced directive packet in shadow chart to become permanent part of medical record.   Pt requested CSW contact [facility] in regard to potential for placement at [facility]. Pt discussed that she is aware of limitation of having Medicaid only, but wanted CSW to see if a bed may be coming available in the coming days as pt not yet medically ready for discharge. Pt is agreeable to [facility] if [facility] is unable to accept.  CSW contacted [facility] who stated that they would review pt information.   CSW to continue to follow to assist with pt disposition needs.  Pt and pt brother appreciative of assistance with completion of living will today.   CSW to continue to follow.   Alison Murray, MSW, [place] Work [phone]

## 2014-01-20 NOTE — Progress Notes (Signed)
Chaplain followed up with patient's social worker regarding the completion of an AD. Chaplain offered ministry of presence and support to patient. Patient was on the phone. Chaplain will follow up if needed or requested.   01/20/14 1500  Clinical Encounter Type  Visited With Patient;Health care provider  Visit Type Follow-up

## 2014-01-20 NOTE — Progress Notes (Signed)
Pt sts she can not move her toes or legs, raises her legs with her arms; pt sts "my legs stopped working a week ago, before then i was up walking all the time" Pt refuses pro stat and her steroids; awaiting to see oncology

## 2014-01-20 NOTE — Progress Notes (Signed)
[facility] PROGRESS NOTE  Debbie Larsen FXT:[id number] DOB: [birth year] DOA: 01/13/2014 PCP: Melrose Nakayama, MD  Brief narrative: 63 y.o. female with past medical history of right breast adenocarcinoma diagnoses in 04/2001 s/p chemo and RT to right breast, high-grade metastatic carcinoma, unknown primary, bone and brain metastasis status post systemic chemo, was treated as breast cancer with Abraxane until 01/2013. Repeat PET scan on 03/13/2013 showed evidence for disease progression. Pt was admitted 01/13/2014 for progressive weakness, poor oral intake, weight loss. She has been started on Decadron on the admission but continues to have no strength in lower extremities. I have requested radiation oncology for further evaluation as well as MRI of lumbar spine.  Assessment/Plan:   Principal Problem:  Progressive generalized weakness, lower extremity weakness/ paralysis/ failure to thrive  Patient has high-grade metastatic carcinoma. She has brain and bone metastasis which could possibly explain her lower extremity weakness. Order placed for MRI lumbar spine and repeat MRI brain (lst MRI brain in 12/2013). We will follow up on radiation oncology if there is a role in palliative radiation. Patient was seen by physical therapy and recommendation was for skilled nursing facility however patient cannot participate in physical rehabilitation because she cannot move legs. In regards to her failure to thrive, we encouraged the patient to eat more especially the food she likes. She is a vegetarian. I spoke with her and her brother extensively about foods higher in protein content. She will try to eat little more. She does not like protein supplements and has been taking them inconsistently. IV fluids are now at 50 cc/hr. 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 Oncology will follow up treatment recommendations. Patient is currently on decadron 4 mg PO BID. Bone pain  Related to bone metastases. Per oncology recommendations patient was given one dose of Zometa on 01/16/2014. Continue dexamethasone 4 mg PO BID. Awaiting evaluation by radiation oncology. Awaiting results of MRI lumbar spine. Hyponatremia  Likely due to dehydration. Sodium trended up from 127 to 134. Hypothyroidism  Continue synthroid 75 mcg daily. Anemia / Thrombocytopenia  Secondary to chronic disease secondary to malignancy and sequela of chemotherapy. Hemoglobin was 7.9 on 3/22 and she has received 1 unit of PRBC transfusion. We will recheck CBC in am. Platelet count is 99. Elevated liver enzymes  Likely due to metastatic disease. AST 80, ALT 68 and ALP 167 (slightly better since admission)  Severe protein calorie malnutrition  Secondary to progressive malignancy. As mentioned above, we have encouraged by mouth intake. Patient refused to take feeding supplements.  Code Status: full code  Family Communication: family at the bedside; updated pt brother  Disposition Plan: remains inpatient; SNF discharge Monday   Consultants:  Oncology (Dr. Julien Nordmann)  Rad oncology  Procedures:  None  Antibiotics:  None    [name], MD  [facility] Pager [phone]  If 7PM-7AM, please contact night-coverage [email or web address] Password [facility] 01/20/2014, 12:24 PM   LOS: 7 days    HPI/Subjective: No acute overnight events.  Objective: Filed Vitals:   01/19/14 1835 01/19/14 1935 01/19/14 1951 01/20/14 0536  BP: 118/76 120/72 124/70 114/62  Pulse: 96  92 95 94  Temp: 98.7 F (37.1 C) 98.4 F (36.9  C) 98.6 F (37 C) 99.8 F (37.7 C)  TempSrc: Oral Oral Oral Oral  Resp: 17 16 16 18   Height:      Weight:      SpO2: 100% 100% 100% 98%    Intake/Output Summary (Last 24 hours) at 01/20/14 1224 Last data filed at 01/20/14 0700  Gross per 24 hour  Intake 2262.5 ml  Output   5275 ml  Net -3012.5 ml    Exam:   General:  Pt is alert, follows commands appropriately, not in acute distress  Cardiovascular: Regular rate and rhythm, S1/S2, no murmurs, no rubs, no gallops  Respiratory: Clear to auscultation bilaterally, no wheezing, no crackles, no rhonchi  Abdomen: Soft, non tender, non distended, bowel sounds present, no guarding  Extremities: No edema, pulses DP and PT palpable bilaterally  Neuro: LE weakness, no strength in either of the legs; ? Urinary incontinence she says she is incontinent of urine but she has foley in  Data Reviewed: Basic Metabolic Panel:  Recent Labs Lab 01/14/14 0115 01/14/14 0631 01/14/14 1109 01/15/14 1120 01/19/14 0540  NA 129* 130* 127* 129* 134*  K  --  3.7  --  3.8 4.1  CL  --  95*  --  93* 101  CO2  --  20  --  25 22  GLUCOSE  --  115*  --  104* 139*  BUN  --  6  --  8 6  CREATININE  --  0.59  --  0.65 0.53  CALCIUM  --  8.2*  --  8.8 8.2*  MG  --  2.0  --   --   --   PHOS  --  3.1  --   --   --    Liver Function Tests:  Recent Labs Lab 01/14/14 0631 01/15/14 1120  AST 60* 80*  ALT 65* 68*  ALKPHOS 152* 167*  BILITOT 0.3 0.4  PROT 5.9* 5.9*  ALBUMIN 3.0* 3.1*   No results found for this basename: LIPASE, AMYLASE,  in the last 168 hours No results found for this basename: AMMONIA,  in the last 168 hours CBC:  Recent Labs Lab 01/14/14 0631 01/15/14 1120 01/19/14 0540  WBC 5.0 4.2 4.5  HGB 8.9* 9.0* 7.9*  HCT 25.1* 26.0* 23.4*  MCV 77.7* 79.3 80.7  PLT 82* 93* 99*   Cardiac Enzymes: No results found for this basename: CKTOTAL, CKMB, CKMBINDEX, TROPONINI,  in the last 168 hours BNP: No components found with  this basename: POCBNP,  CBG: No results found for this basename: GLUCAP,  in the last 168 hours  No results found for this or any previous visit (from the past 240 hour(s)).   Studies: No results found.  Scheduled Meds: . B-complex with vitamin C  1 tablet Oral Daily  . dexamethasone  4 mg Oral BID  . levothyroxine  75 mcg Oral QAC breakfast  . multivitamin  1 tablet Oral Daily  . zolpidem  5 mg Oral QHS   Continuous Infusions: . sodium chloride 50 mL/hr at 01/19/14 2058

## 2014-01-20 NOTE — Progress Notes (Signed)
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  History: The patient is seen today for evaluation of lower extremity weakness which has progressed over the past week. She also reports loss of sensation in the lower extremities in addition to bladder and bowel dysfunction. She completed whole brain radiation therapy approximately 10 days ago. She was admitted to the hospital for further evaluation. She is been on 4 mg of dexamethasone twice a day, but she discontinued this over the weekend because of difficulty sleeping. She denies back pain although she does have intermittent mild lower back discomfort when she stage I position for a long time. She had a MRI of the lumbar spine this afternoon which shows suspected intramedullary metastasis above the field of view of the lumbar spine MRI and based on thoracic spinal cord and conus edema. MRI of the thoracic spine is recommended.  Physical examination: Alert and oriented. Wt Readings from Last 3 Encounters:  01/14/14 105 lb 4.8 oz (47.764 kg)  01/06/14 107 lb 1.6 oz (48.58 kg)  12/30/13 108 lb 1.6 oz (49.034 kg)   Temp Readings from Last 3 Encounters:  01/20/14 98.2 F (36.8 C) Oral  01/06/14 98.4 F (36.9 C)   12/30/13 98.7 F (37.1 C)    BP Readings from Last 3 Encounters:  01/20/14 110/60  01/06/14 106/62  12/30/13 135/68   Pulse Readings from Last 3 Encounters:  01/20/14 92  01/06/14 97  12/30/13 83     Neurologic examination: There is decreased sensation to light touch below the T10 dermatome bilaterally. The left and  right lower extremities are flaccid. Back: There is no palpable spinal discomfort.  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 I explained to the patient and her brother that she is unlikely to improve from a neurologic standpoint. We could proceed with an urgent MRI of the thoracic spine and  consider increasing her dexamethasone. She does not want a MRI scan this evening or to increase her dexamethasone. She does want a MRI scan of her thoracic spine tomorrow. I think she would be a good candidate for palliative care consultation. She is recently comfortable at this time.  Plan: I defer to her attending physician for scheduling of a MRI scan tomorrow. Based on the findings, we can talk more realistically about her prognosis. Given the fact that she is non-ambulatory there is less than a 1/10 chance that she will become ambulatory even with radiation therapy.

## 2014-01-20 NOTE — Progress Notes (Signed)
NUTRITION FOLLOW UP  Intervention:   -Discontinue Pro-Stat -Discontinue MagicCup Supplement -Recommend trial of MuscleMilk protein supplement -Continue with Resource Breeze BID -Encouraged PO intake -Consider addition of appetite stimulant   Nutrition Dx:   Inadequate oral intake related to decreased appetite/loss of taste/nausea as evidenced by PO intake <75%, wt loss.    Goal:   Pt to meet >/= 90% of their estimated nutrition needs    Monitor:   Supplement tolerance, total protein/energy intake, labs, weights, GI profile  Assessment:   Debbie Larsen is a 63 y.o. 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 She denies any fever or chills, has mild nausea, denies vomiting   3/17: -Pt reported an unintentional wt loss of 15 lbs in past 2 months since beginning chemo and radiation treatments d/t decreased appetite, nausea and loss of taste  -Has been on liquid/soft food diet for past 10 days, has experienced 5-6 lbs weight loss  -Diet recall indicated pt consuming tea/milk for breakfast, and cream based soups or fruit smoothies for lunch/dinner.  -Has been trying to incorporate high protein foods into diet- kefir, greek yogurt, egg proteins. Contacted Patient Risk analyst for pt to receive milkshake once daily  -Pt dislikes Ensure/Boost supplements, was willing to try MagicCup  -Has had nausea and difficulty swallowing solid foods- noted difficulty with oatmeal getting stuck in throat  -Encouraged pt to use mint/lemon drops and cold foods to assist with taste changes  -Provided pt with handouts for other nutrition therapy  recommendations. Reviewed full liquid diet options and possible ways to increase kcal and protein intake. Recommend use of Beneprotein with meals to increase food's nutrient density  -Recommend pt be advanced to a mechanical soft diet as tolerated  -Pt last radiation treatment was 5 days ago   3/23: -Had provided pt with milkshakes daily, pt refusing as she reported being able to tolerate more solid food; however pt with minimal PO intake, <25% per RN documentation. -Dislikes MagicCup and Pro-Stat supplement. Will d/c -Enjoys [employer], will continue to recommend -Pt's brother bringing in foods from outside sources -Trialed organic MuscleMilk as pt/pt's family interested in trying Ensure alternative -Has been using lemon drops/mints to assist with taste changes -MD noted pt with progression of disease to bones and brain. Has also been encouraging pt's protein intake   Height: Ht Readings from Last 1 Encounters:  01/13/14 5' (1.524 m)    Weight Status:   Wt Readings from Last 1 Encounters:  01/14/14 105 lb 4.8 oz (47.764 kg)    Re-estimated needs:  Kcal: 1450-1650  Protein: 60-70 gram  Fluid: >/=1650 ml/dail  Skin: WDL  Diet Order: General   Intake/Output Summary (Last 24 hours) at 01/20/14 1619 Last data filed at 01/20/14 1300  Gross per 24 hour  Intake 1782.5 ml  Output   4525 ml  Net -2742.5 ml    Last BM: 3/22   Labs:   Recent Labs Lab 01/14/14 0115 01/14/14 0631 01/14/14 1109 01/15/14 1120 01/19/14 0540  NA 129* 130* 127* 129* 134*  K  --  3.7  --  3.8 4.1  CL  --  95*  --  93* 101  CO2  --  20  --  25 22  BUN  --  6  --  8 6  CREATININE  --  0.59  --  0.65 0.53  CALCIUM  --  8.2*  --  8.8 8.2*  MG  --  2.0  --   --   --   PHOS  --  3.1  --   --   --   GLUCOSE  --  115*  --  104* 139*    CBG (last 3)  No results found for this basename: GLUCAP,  in the last 72 hours  Scheduled Meds: . B-complex with vitamin C  1 tablet Oral Daily  .  dexamethasone  4 mg Oral BID  . feeding supplement (PRO-STAT SUGAR FREE 64)  30 mL Oral TID WC  . feeding supplement (RESOURCE BREEZE)  1 Container Oral TID BM  . levothyroxine  75 mcg Oral QAC breakfast  . multivitamin with minerals  1 tablet Oral Daily  . zolpidem  5 mg Oral QHS    Continuous Infusions: . sodium chloride 50 mL/hr at 01/20/14 [place] RD LDN Clinical Dietitian [phone]

## 2014-01-20 NOTE — Progress Notes (Signed)
Physical Therapy Treatment Patient Details Name: Debbie Larsen MRN: [id number] DOB: 01-24-1951 Today's Date: 01/20/2014 Time: 3295-1884 PT Time Calculation (min): 27 min  PT Assessment / Plan / Recommendation  History of Present Illness 63 y.o. female with past medical history of right breast adenocarcinoma diagnoses in 04/2001 s/p chemo and RT to right breast, high-grade metastatic carcinoma.. Pt was admitted 01/13/2014 for progressive weakness, poor oral intake, weight loss. . 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 Pt  Participated in PROM and rolling, pt is able to pull legs up and roll with use of rail and push legs back into extension.   Follow Up Recommendations  SNF     Does the patient have the potential to tolerate intense rehabilitation     Barriers to Discharge        Equipment Recommendations  Wheelchair (measurements PT);Wheelchair cushion (measurements PT)    Recommendations for Other Services    Frequency Min 3X/week   Progress towards PT Goals Progress towards PT goals: Progressing toward goals  Plan Current plan remains appropriate;Frequency needs to be updated    Precautions / Restrictions Precautions Precautions: Fall Precaution Comments: new BLE weakness-no movement-paraplegia???   Pertinent Vitals/Pain Pt reports her legs are painful even though she cannot feel them.    Mobility  Bed Mobility Overal bed mobility: Needs Assistance Bed Mobility: Rolling Rolling: Min assist General bed mobility comments: Rolled into sidelying for position change. Assist to move/position bil LEs. provided  a leg lifter to use to reposition legs.    Exercises General Exercises - Lower Extremity Ankle Circles/Pumps: PROM;Both;10 reps;Supine Heel Slides: PROM;Both;10 reps;Supine Hip ABduction/ADduction: PROM;Both;10 reps;Supine Straight Leg Raises: AAROM;Both;10 reps;Supine   PT Diagnosis:    PT  Problem List:   PT Treatment Interventions:     PT Goals (current goals can now be found in the care plan section)    Visit Information  Last PT Received On: 01/20/14 History of Present Illness: 63 y.o. female with past medical history of right breast adenocarcinoma diagnoses in 04/2001 s/p chemo and RT to right breast, high-grade metastatic carcinoma.. Pt was admitted 01/13/2014 for progressive weakness, poor oral intake, weight loss. . 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

## 2014-01-21 ENCOUNTER — Inpatient Hospital Stay (HOSPITAL_COMMUNITY): Payer: Medicaid Other

## 2014-01-21 ENCOUNTER — Ambulatory Visit: Payer: Medicaid Other

## 2014-01-21 ENCOUNTER — Encounter: Payer: Self-pay | Admitting: Radiation Oncology

## 2014-01-21 LAB — BASIC METABOLIC PANEL
BUN: 13 mg/dL (ref 6–23)
CHLORIDE: 90 meq/L — AB (ref 96–112)
CO2: 21 meq/L (ref 19–32)
Calcium: 8.6 mg/dL (ref 8.4–10.5)
Creatinine, Ser: 0.51 mg/dL (ref 0.50–1.10)
GFR calc Af Amer: >90 mL/min (ref >90)
GFR calc non Af Amer: >90 mL/min (ref >90)
Glucose, Bld: 174 mg/dL — ABNORMAL HIGH (ref 70–99)
POTASSIUM: 4.3 meq/L (ref 3.7–5.3)
Sodium: 126 mEq/L — ABNORMAL LOW (ref 137–147)

## 2014-01-21 LAB — CBC
HCT: 30.3 % — ABNORMAL LOW (ref 36.0–46.0)
Hemoglobin: 10.5 g/dL — ABNORMAL LOW (ref 12.0–15.0)
MCH: 27.5 pg (ref 26.0–34.0)
MCHC: 34.7 g/dL (ref 30.0–36.0)
MCV: 79.3 fL (ref 78.0–100.0)
Platelets: 132 10*3/uL — ABNORMAL LOW (ref 150–400)
RBC: 3.82 MIL/uL — AB (ref 3.87–5.11)
RDW: 15.7 % — ABNORMAL HIGH (ref 11.5–15.5)
WBC: 8.8 10*3/uL (ref 4.0–10.5)

## 2014-01-21 MED ORDER — DEXAMETHASONE SODIUM PHOSPHATE 4 MG/ML IJ SOLN
4.0000 mg | INTRAMUSCULAR | Status: DC
  Administered 2014-01-21 – 2014-01-23 (×3): 4 mg via INTRAVENOUS
  Filled 2014-01-21 (×3): qty 1

## 2014-01-21 MED ORDER — GADOBENATE DIMEGLUMINE 529 MG/ML IV SOLN
9.0000 mL | Freq: Once | INTRAVENOUS | Status: AC | PRN
  Administered 2014-01-21: 9 mL via INTRAVENOUS

## 2014-01-21 NOTE — Progress Notes (Addendum)
[facility] PROGRESS NOTE  Debbie Larsen GHW:[id number] DOB: Dec 29, 1950 DOA: 01/13/2014 PCP: Melrose Nakayama, MD  Brief narrative: 63 y.o. female with past medical history of right breast adenocarcinoma diagnoses in 04/2001 s/p chemo and RT to right breast, high-grade metastatic carcinoma, unknown primary, bone and brain metastasis status post systemic chemo, was treated as breast cancer with Abraxane until 01/2013. Repeat PET scan on 03/13/2013 showed evidence for disease progression. Pt was admitted 01/13/2014 for progressive weakness, poor oral intake, weight loss. She has been started on Decadron on the admission but continues to have no strength in lower extremities. Her MRI lumbar and thoracic spine showed extensive metastatic disease with cord edema and per radiation oncolgy palliative radiotherapy will liekly not provide much improvement.   Assessment/Plan:   Principal Problem:  Progressive generalized weakness, lower extremity weakness/ paralysis/ failure to thrive  Patient has high-grade metastatic carcinoma. She has brain and bone metastasis which could explain her lower extremity weakness. MRI of thoracic and lumbar spine confirms extensive metastatic lesions in the spine and cord edema. I asked radiation oncology to see if there is a role for palliative radiotherapy but per their recommendations  her weakness/paralysis is not likely to improve even with RT. She is taking decadron inconsitently because she reports not being able to sleep. She reported sleeping medications do not help her fall asleep. At this time oncology recommends hospice referral. Order also placed for palliative care per patient's brother request.  In regards to patient's failure to thrive, we encouraged the patient to eat more especially the food she likes.  Active Problems:  Metastatic carcinoma of undetermined primary with metastases to bones and brain  CT chest showed progression of metastatic disease  within the mediastinum and lung parenchyma, increased size of the lung nodules and number of nodules, interval increase in liver metastatic disease, soft tissue mass within the pelvis on the left which could possibly reflect metastatic disease or possibly primary ovarian origin; per oncology primary possibly breast or lung. We will proceed with hospice placement per oncology recommendations.  Bone pain  Related to bone metastases. Per oncology recommendations patient was given one dose of Zometa on 01/16/2014. Continue dexamethasone 4 mg IV daily.  Hyponatremia  Likely due to dehydration. Sodium trended up from 127 to 134 but then again 126 this am.  Hypothyroidism  Continue synthroid 75 mcg daily.  Anemia / Thrombocytopenia  Secondary to chronic disease due to malignancy and sequela of chemotherapy. Hemoglobin was 7.9 on 3/22 and she has received 1 unit of PRBC transfusion. Her hemoglobin is 10.5 today and platelet count 132.  Elevated liver enzymes  Likely due to metastatic disease. AST 80, ALT 68 and ALP 167 (slightly better since admission)  Severe protein calorie malnutrition  Secondary to progressive malignancy. As mentioned above, we have encouraged by mouth intake. Patient refused to take feeding supplements.   Code Status: DNR/DNI Family Communication: family at the bedside; updated pt brother  Disposition Plan: remains inpatient   Consultants:  Oncology (Dr. Julien Nordmann)  Rad oncology  Palliative care  Procedures:  None  Antibiotics:  None   [name], MD  [facility] Pager [phone]  If 7PM-7AM, please contact night-coverage [email or web address] Password [facility] 01/21/2014, 5:07 PM   LOS: 8 days     HPI/Subjective: No acute overnight events.   Objective: Filed Vitals:   01/20/14 0536 01/20/14 1451 01/20/14 2023 01/21/14 0434  BP: 114/62 110/60 118/72 110/68  Pulse: 94 92 95 103  Temp: 99.8 F (37.7 C)  98.2 F (36.8 C) 98.4 F (36.9 C) 98.2 F (36.8 C)   TempSrc: Oral Oral Oral Oral  Resp: 18 18 18 16   Height:      Weight:      SpO2: 98% 98% 98% 99%    Intake/Output Summary (Last 24 hours) at 01/21/14 1707 Last data filed at 01/21/14 1400  Gross per 24 hour  Intake   1520 ml  Output   4400 ml  Net  -2880 ml    Exam:   General:  Pt is alert, follows commands appropriately, not in acute distress  Cardiovascular: Regular rate and rhythm, S1/S2, no murmurs, no rubs, no gallops  Respiratory: Clear to auscultation bilaterally, no wheezing, no crackles, no rhonchi  Abdomen: Soft, non tender, non distended, bowel sounds present, no guarding  Extremities: No edema, pulses DP and PT palpable bilaterally  Neuro: Bilateral LE paralysis  Data Reviewed: Basic Metabolic Panel:  Recent Labs Lab 01/15/14 1120 01/19/14 0540 01/21/14 0616  NA 129* 134* 126*  K 3.8 4.1 4.3  CL 93* 101 90*  CO2 25 22 21   GLUCOSE 104* 139* 174*  BUN 8 6 13   CREATININE 0.65 0.53 0.51  CALCIUM 8.8 8.2* 8.6   Liver Function Tests:  Recent Labs Lab 01/15/14 1120  AST 80*  ALT 68*  ALKPHOS 167*  BILITOT 0.4  PROT 5.9*  ALBUMIN 3.1*   No results found for this basename: LIPASE, AMYLASE,  in the last 168 hours No results found for this basename: AMMONIA,  in the last 168 hours CBC:  Recent Labs Lab 01/15/14 1120 01/19/14 0540 01/21/14 0616  WBC 4.2 4.5 8.8  HGB 9.0* 7.9* 10.5*  HCT 26.0* 23.4* 30.3*  MCV 79.3 80.7 79.3  PLT 93* 99* 132*   Cardiac Enzymes: No results found for this basename: CKTOTAL, CKMB, CKMBINDEX, TROPONINI,  in the last 168 hours BNP: No components found with this basename: POCBNP,  CBG: No results found for this basename: GLUCAP,  in the last 168 hours  No results found for this or any previous visit (from the past 240 hour(s)).   Studies: Mr Kizzie Fantasia Contrast  01/21/14   CLINICAL DATA:  Breast cancer.  Evaluate for metastatic disease.  EXAM: MRI HEAD WITHOUT AND WITH CONTRAST  TECHNIQUE: Multiplanar,  multiecho pulse sequences of the brain and surrounding structures were obtained without and with intravenous contrast.  CONTRAST:  MultiHance 9 mL.  COMPARISON:  MR HEAD WO/W CM dated 12/18/2013; MR C SPINE WO/W CM dated 02/21/2013  FINDINGS: No acute stroke or hemorrhage. No hydrocephalus or extra-axial fluid.  Intracranial metastatic disease is re-demonstrated, as was observed on the previous MR. Subcentimeter lesions are seen in the right cerebellar hemisphere, inferior vermis, right temporal lobe, left temporal lobe, and multiple lesions throughout the subcortical white matter of left greater than right cerebral hemispheres. The dominant lesion is an osseous metastasis to the greater wing of the sphenoid on the right, measures 17 x 19 mm, unchanged in size from priors. A dural based lesion is also seen over the right frontal convexity, similar to priors. No midline shift. No impending herniation. Moderate vasogenic edema is associated with the right temporal lesion, increased from priors, and could predispose the patient to seizures. Other lesions also are roughly similar, including a spherical 7 mm lesion deep to the insula. There is slight increased edema surrounding the right cerebellar lesion compared with priors, but there is slightly less edema surrounding the left parietal subcortical lesion. A few  more lesions are visible than were previously noted on the 12/18/2013 study, but there was considerable motion on that exam.  Marrow heterogeneity in the clivus could suggest additional tumor involvement. Low T1 signal intensity bone marrow in the upper cervical region unchanged from priors.  IMPRESSION: Suspected slight progression of metastatic disease, with a few more lesions visible than were observed on 12/18/2013, as well as increased vasogenic edema surrounding the right middle cranial fossa osseous lesion, and right cerebellar lesion. No impending herniation or midline shift.  No lesions involving the  parasagittal posterior frontal cortex which might cause bilateral leg weakness.   Electronically Signed   By: Rolla Flatten M.D.   On: 01/20/2014 20:09   Mr Thoracic Spine W Contrast  01/21/2014   CLINICAL DATA:  63 year old female with stage IV breast cancer. New onset lower extremity weakness and unable to move the lower extremities. Initial encounter.  EXAM: MRI THORACIC SPINE WITH CONTRAST  TECHNIQUE: Multiplanar and multiecho pulse sequences of the thoracic spine were obtained with intravenous contrast.  CONTRAST:  7mL MULTIHANCE GADOBENATE DIMEGLUMINE 529 MG/ML IV SOLN  COMPARISON:  Lumbar MRI 01/20/2014.  FINDINGS: Diffusely abnormal bone marrow signal. Subsequently, cervical vertebral delineation is difficult on the sagittal scout view. The numbering system here agrees with that on the recent lumbar exam.  Diffuse thoracic spine and widespread posterior rib metastases. Still, no thoracic epidural or extraosseous tumor identified.  Intra-axial partially exophytic solidly enhancing mass of the thoracic spinal cord centered at the T8 level measures 7 x 9 x 17 mm (AP by transverse by CC) and is associated with widespread thoracic spinal cord edema. Only a portion of the conus medullaris, and the ventral lower cervical cord is spared. Mild cord expansion suspected below the level of the tumor.  There is also a much smaller (2 mm) enhancing cord metastasis at the T2-T3 level just to the right of midline (series 10, image 8).  No definite cauda equina tumor. No abnormal dural thickening or enhancement.  Otherwise, bilateral lung nodules are suggested, and central right liver mass may have increased (series 7, image 39). See Chest abdomen pelvis CT 01/14/2014.  IMPRESSION: 1. 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 Associated spinal cord edema is widespread. 2. Diffuse osseous metastatic disease. Study discussed by telephone with Dr. [name] on 01/21/2014 at 15:29 .    Electronically Signed   By: Lars Pinks M.D.   On: 01/21/2014 15:31   Mr Lumbar Spine W Wo Contrast  01/20/2014   CLINICAL DATA:  New onset of lower extremity weakness. Breast cancer.  EXAM: MRI LUMBAR SPINE WITHOUT AND WITH CONTRAST  TECHNIQUE: Multiplanar and multiecho pulse sequences of the lumbar spine were obtained without and with intravenous contrast.  CONTRAST:  34mL MULTIHANCE GADOBENATE DIMEGLUMINE 529 MG/ML IV SOLN  COMPARISON:  CT ABD/PELVIS W CM dated 01/14/2014; MR HEAD WO/W CM dated 01/20/2014; NM PET IMAGE RESTAG (PS) SKULL BASE TO THIGH dated 10/08/2013  FINDINGS: The scan extends from midportion T11 through the sacrum.  Widespread marrow heterogeneity reflecting osseous metastatic disease and post treatment effect. No areas of pathologic compression deformity are evident. There is no visible epidural tumor. The alignment is anatomic.  No pelvic masses.  Bladder decompressed with Foley catheter.  No disc protrusion or spinal stenosis. Post infusion imaging demonstrates no abnormal intraspinal enhancement of the nerve roots or distal conus.  On sagittal T2 and STIR imaging, the conus appears abnormal, displaying intramedullary T2  bright signal and slight cord enlargement. An intramedullary metastasis above the field-of-view, at T10 or above, is suspected, with associated distal cord edema. There is no definite abnormal enhancement of the visualized thoracic cord or conus on post infusion imaging.  IMPRESSION: Suspected intramedullary metastasis above the field-of-view of lumbar spine MRI. The distal thoracic cord and conus appear to display T2 bright signal concerning for edema. Thoracic spine MRI without and with contrast recommended for further evaluation.  No evidence for spinal stenosis, dropped metastases to the cauda equina, or intraspinal mass lesion in the lumbar or sacral segments.  Widespread osseous disease without pathologic compression fracture or malalignment.   Electronically Signed   By:  Rolla Flatten M.D.   On: 01/20/2014 19:19    Scheduled Meds: . B-complex with vitamin C  1 tablet Oral Daily  . dexamethasone  4 mg Intravenous Q24H  . feeding supplement (RESOURCE BREEZE)  1 Container Oral TID BM  . levothyroxine  75 mcg Oral QAC breakfast  . multivitamin with minerals  1 tablet Oral Daily  . zolpidem  5 mg Oral QHS   Continuous Infusions: . sodium chloride 50 mL/hr at 01/20/14 1509

## 2014-01-21 NOTE — Progress Notes (Addendum)
Occupational Therapy Treatment Patient Details Name: Debbie Larsen MRN: [id number] DOB: Mar 19, 1951 Today's Date: 01/21/2014    History of present illness 63 y.o. female with past medical history of right breast adenocarcinoma diagnoses in 04/2001 s/p chemo and RT to right breast, high-grade metastatic carcinoma.. Pt was admitted 01/13/2014 for progressive weakness, poor oral intake, weight loss. . 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 Pt very appreciative. Also worked on therapy ball exercises for grip strengthening and theraband for L UE exercises. Educated on AROM for R shoulder and therband for R elbow exercises. Discussed importance of pressure relief and LE positioning.    Follow Up Recommendations  SNF;Supervision/Assistance - 24 hour    Equipment Recommendations  Other (comment) (to be determined next venue)    Recommendations for Other Services      Precautions / Restrictions Precautions Precautions: Fall Precaution Comments: new BLE weakness-no movement-paraplegia??? Restrictions Weight Bearing Restrictions: No       Mobility Bed Mobility                  Transfers                      Balance                                   ADL  Issued more red foam grip and explained use for utensils as well as toothbrush, pen, etc. Pt states the foam worked well for her this weekend. She was able to independently put the red foam grip on a toothbrush handle today (no utensil present in room for session)                          Vision                     Perception     Praxis      Cognition   Behavior During Therapy: [facility] for tasks assessed/performed                         Extremity/Trunk Assessment               Exercises Other Exercises Other Exercises: Issued therapy ball and explained use for grip strengthening. Pt  performed at least 10 squeezes with ball. Issued yellow theraband and explained recommendation for use with L UE at shoulder only and only do A/AROM for R shoulder currently. Explained she can use band for bilateral elbow exercises. Pt performed AROM/AAROM for R UE X 10 reps shoulder flexion, X 10 elbow flexion/extension. She also performed X10 shoulder flexion on L with band, elbow flexion/extension with band X 10, and abduction with band X 10. Educated on how to increase/decrease resistance with band positioning.  Educated on performing X 10 reps 2-3 sets per day.     General Comments      Pertinent Vitals/ Pain       No complaint of pain  Home Living                                          Prior Functioning/Environment  Frequency Min 2X/week     Progress Toward Goals  OT Goals(current goals can now be found in the care plan section)  Progress towards OT goals: Progressing toward goals     Plan Discharge plan remains appropriate    End of Session Equipment Utilized During Treatment: Other (comment)  Activity Tolerance Patient tolerated treatment well   Patient Left in bed;with call bell/phone within reach   Nurse Communication          Time: 8329-1916 OT Time Calculation (min): 32 min  Charges: OT General Charges $OT Visit: 1 Procedure OT Treatments $Self Care/Home Management : 8-22 mins $Therapeutic Exercise: 8-22 mins  Pauline Aus [place] 01/21/2014, 11:14 AM

## 2014-01-21 NOTE — Progress Notes (Signed)
CSW received notification from attending MD that oncologist met with pt today and recommendation is for residential hospice and pt agreeable.  CSW met with pt at bedside. Pt appearing in good spirits and acknowledged just meeting with Dr. Jana Hakim. Pt discussed that she is aware of her poor prognosis and accepting of the fact that she does not have long to live. Pt expressed concern that pt brother will not take the news well about poor prognosis and pt plans to discuss her situation with pt brother. Pt hopeful that her brother will be accepting of prognosis and pt expressed wanting to reassure her brother that she will be without pain and disease once she passes away. Pt coping well with current news and consideration of residential hospice.  CSW provided pt with residential hospice list. Pt reported that Dr. Jana Hakim recommended [facility] as he is able to see pt at [facility]. Pt agreeable to referral to [facility]. CSW encouraged pt to review residential hospice list in the instance that [facility] is full and there is no anticipation of bed availability.Pt expressed understanding, but appears hesitant to explore other options at this time as Dr. Jana Hakim recommended [facility].  CSW made referral to [facility], Erling Conte.  CSW to continue to follow to assist with transition to residential hospice.  Alison Murray, MSW, [place] Work [phone]

## 2014-01-21 NOTE — Progress Notes (Signed)
Md ordered foot splints, ortho tech wants to know specific type of splints ordered so he can deliver it to patients, MD made aware.

## 2014-01-21 NOTE — Progress Notes (Signed)
CC: Dr. [name], Dr. Gunnar Bulla Magrinat, Dr. Curt Bears  Progress note: The patient completed her MRI scan of the thoracic spine this afternoon. 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 Of note is that her disease within the brain is progressing despite having recently completed whole brain radiation therapy. There is no realistic chance that she will ever become ambulatory even with spinal irradiation. I strongly recommend Palliative Care consultation. This can be requested by Dr. Charlies Silvers.  She is reasonably comfortable at this time. Her life expectancy is limited.

## 2014-01-21 NOTE — Consult Note (Signed)
[facility]  Telephone:([phone] Fax:(336) [phone]     ID: Greer Ee OB: [birth year]  MR#: [id number]  CSN#:[id number]  PCP: Melrose Nakayama, MD GYN:   SU:  OTHER MD:  CHIEF COMPLAINT:  BREAST CANCER HISTORY: The patient's 2002-2004 records have been retrieved. The patient underwent a right breast core biopsy 03/29/2001 for an invasive ductal carcinoma associated with necrosis ([id number]). 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 The tumor was estrogen receptor 95% positive, progesterone receptor 96% positive, with an MIB-1 of 11%, and no HER-2 amplification by the HercepTest. She completed 6 cycles of CMF chemotherapy on 12/12/2001. There were minor delays, but no dose reductions. She received radiation to the right breast and regional lymph nodes to a total of 5040 cGy with a right breast boost of 12 [place] Cables, completed 02/12/2002  More recently, "Collie Siad" develop some right shoulder pain sometime in late 2012. She had followup mammograms of until 2011. There was no mammogram performed in 2012. She was seen by physical therapy in orthopedic surgery for evaluation of her right shoulder pain. She had a mammogram on 07/24/2012 which showed a suspicious mass behind eczema of the right breast. Physical exam the time did show multiple involved right axillary lymph nodes. 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 HER-2 nonamplified the ratio by CISH being 1.43. Proliferative index was 50%.  MRI scan of both breasts performed on 08/31/2012 showed multiple involved lymph nodes in the right axilla, no obvious masses in either breast. Unfortunately staging PET scan 09/12/2012 showed in addition to her right axillary recurrence, bilateral mediastinal and  left lung nodules consistent with stage IV disease.  Her subsequent history is as detailed below.   INTERVAL HISTORY: Collie Siad was admitted thorugh the ED with progressive lower extremity weakness 01/13/2014. 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 Pulmonary Oncology and Radiation Oncology also have been consulted regarding further management-- please refer to Drs Vergie Living and Mohamed's notes  REVIEW OF SYSTEMS: Collie Siad is alert and "fine." She is not in pain. She denies headaches, viaual changes, nausea of vomiting. Her breahting is "OK". She has not had a BM "in a long time". Appetite is down. She tells me her son came to visit two weeks ago "but he is now in [place]." She is trying to get some legal papers completed so her brother may be able to manage her affairs as she becomes less competent. She tells me 4 mg dexamethasone is all she can tolerate. A detailed review of systems was otherwise significant for her lower extremity numbness and weakness. She has a foley in place  PAST MEDICAL HISTORY: Past Medical History  Diagnosis Date  . 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 Hypothyroid   . Enlarged lymph nodes   . 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  . S/P radiation therapy     Right breast/regional lymph nodes/ 5040 cGy / 28 fractions/ bost of 1200 cGy/ 6 Fractions  . Status post chemotherapy     6 cycles of CMF completed on 12/12/2001  . Right shoulder pain     started late 2012  . Dry cough  intermittent  . SOB (shortness of breath)   . Use of letrozole (Femara) 03/14/2013  . Cholelithiasis   . Anemia   . Stroke   . BREAST CANCER 04/2001    s/p radiation therapy, chemotherapy and lumpectomy  . Metastasis to bone 03/13/13    PET scan results  . Metastasis to bone     breast primary  . Cancer     brain mets  . Lung cancer  10/08/2013  . 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 Tonsillectomy    . Breast lumpectomy  04/2001    excision of L breast fibroadenoma by Dr.   . Breast lumpectomy  05/16/2001    Right axillary sentinel lymph node biopsy.  . Portacath placement  09/19/2012    Procedure: INSERTION PORT-A-CATH;  Surgeon: Haywood Lasso, MD;  Location: [facility];  Service: General;  Laterality: N/A;  . Lipoma excision Right 05/15/2013    Procedure: RIGHT AXILLARY LYMPH NODE DISSECTION;  Surgeon: Haywood Lasso, MD;  Location: [facility];  Service: General;  Laterality: Right;    FAMILY HISTORY Family History  Problem Relation Age of Onset  . Diabetes Mother   . Hypertension Father   . Hypertension Sister   . Hypothyroidism Brother   . Cancer Neg Hx   The patient's father died from heart disease at the age of 71. The patient's mother died in 2012/08/14 at the age of 31 also from heart disease. The patient has 2 sisters and 2 brothers. There is no history of breast or ovarian cancer in the family to her knowledge.   GYNECOLOGIC HISTORY:  G1 P1, first live birth age 73. She underwent menopause at the time of her chemotherapy in 2002. She did not use hormone replacement.  SOCIAL HISTORY:  The patient is divorced and lives by herself. Her son Reisa Coppola lives in [place], where he works as an Administrator, sports. The patient tells me her son lives with 3 roommates" there is no room for me there". (Accordingly going to [employer] for studies is not an option for her). The patient has no grandchildren. She attends the local [employer]  HEALTH MAINTENANCE: History  Substance Use Topics  . Smoking status: Never Smoker   . Smokeless tobacco: Never Used  . Alcohol Use: No    No Known Allergies  Current Facility-Administered Medications  Medication Dose Route Frequency Provider Last Rate Last Dose    . 0.9 %  sodium chloride infusion   Intravenous Continuous Caren Griffins, MD 50 mL/hr at 01/20/14 1509    . acetaminophen (TYLENOL) tablet 650 mg  650 mg Oral Q6H PRN Robbie Lis, MD   650 mg at 01/20/14 0820  . alum & mag hydroxide-simeth (MAALOX/MYLANTA) 200-200-20 MG/5ML suspension 15 mL  15 mL Oral Q4H PRN Robbie Lis, MD      . B-complex with vitamin C tablet 1 tablet  1 tablet Oral Daily Costin Karlyne Greenspan, MD   1 tablet at 01/21/14 1000  . dexamethasone (DECADRON) injection 4 mg  4 mg Intravenous Q24H Robbie Lis, MD   4 mg at 01/21/14 1217  . feeding supplement (RESOURCE BREEZE) (RESOURCE BREEZE) liquid 1 Container  1 Container Oral TID BM Robbie Lis, MD   1 Container at 01/21/14 1000  . gabapentin (NEURONTIN) capsule 100-300 mg  100-300 mg Oral QHS PRN Costin Karlyne Greenspan, MD      .  levothyroxine (SYNTHROID, LEVOTHROID) tablet 75 mcg  75 mcg Oral QAC breakfast Caren Griffins, MD   75 mcg at 01/21/14 0850  . magnesium hydroxide (MILK OF MAGNESIA) suspension 30 mL  30 mL Oral Daily PRN Rhetta Mura Schorr, NP   30 mL at 01/16/14 2144  . multivitamin with minerals tablet 1 tablet  1 tablet Oral Daily Costin Karlyne Greenspan, MD   1 tablet at 01/21/14 1000  . prochlorperazine (COMPAZINE) tablet 10 mg  10 mg Oral Q6H PRN Costin Karlyne Greenspan, MD      . zolpidem (AMBIEN) tablet 5 mg  5 mg Oral QHS Robbie Lis, MD       Facility-Administered Medications Ordered in Other Encounters  Medication Dose Route Frequency Provider Last Rate Last Dose  . heparin lock flush 100 unit/mL  500 Units Intravenous Once Chauncey Cruel, MD      . sodium chloride 0.9 % injection 10 mL  10 mL Intravenous PRN Chauncey Cruel, MD      . sodium chloride 0.9 % injection 10 mL  10 mL Intravenous PRN Chauncey Cruel, MD   10 mL at 07/09/13 1717  . sodium chloride 0.9 % injection 10 mL  10 mL Intracatheter PRN Chauncey Cruel, MD   10 mL at 09/03/13 1644    OBJECTIVE: middle aged [place] woman exmained in  bed Filed Vitals:   01/21/14 0434  BP: 110/68  Pulse: 103  Temp: 98.2 F (36.8 C)  Resp: 16     Body mass index is 20.57 kg/(m^2).    ECOG FS:4 - Bedbound Sclerae unicteric, pupils equal and round No cervical or supraclavicular adenopathy Lungs no rales or rhonchi--auscultated anterolaterally Heart regular rate and rhythm Abd soft, nontender, positive bowel sounds Neuro: paralysis both LE with numbness to touch Breasts: deferred  LAB RESULTS:  CMP     Component Value Date/Time   NA 126* 01/21/2014 0616   NA 134* 12/18/2013 1557   K 4.3 01/21/2014 0616   K 3.9 12/18/2013 1557   CL 90* 01/21/2014 0616   CL 106 03/21/2013 1422   CO2 21 01/21/2014 0616   CO2 22 12/18/2013 1557   GLUCOSE 174* 01/21/2014 0616   GLUCOSE 117 12/18/2013 1557   GLUCOSE 105* 03/21/2013 1422   BUN 13 01/21/2014 0616   BUN 12.3 12/18/2013 1557   CREATININE 0.51 01/21/2014 0616   CREATININE 0.8 12/18/2013 1557   CREATININE 0.74 03/01/2012 1715   CALCIUM 8.6 01/21/2014 0616   CALCIUM 9.5 12/18/2013 1557   PROT 5.9* 01/15/2014 1120   PROT 7.5 12/18/2013 1557   ALBUMIN 3.1* 01/15/2014 1120   ALBUMIN 3.9 12/18/2013 1557   AST 80* 01/15/2014 1120   AST 58* 12/18/2013 1557   ALT 68* 01/15/2014 1120   ALT 67* 12/18/2013 1557   ALKPHOS 167* 01/15/2014 1120   ALKPHOS 148 12/18/2013 1557   BILITOT 0.4 01/15/2014 1120   BILITOT 0.32 12/18/2013 1557   GFRNONAA >90 01/21/2014 0616   GFRAA >90 01/21/2014 0616    I No results found for this basename: SPEP, UPEP,  kappa and lambda light chains    Lab Results  Component Value Date   WBC 8.8 01/21/2014   NEUTROABS 4.0 01/13/2014   HGB 10.5* 01/21/2014   HCT 30.3* 01/21/2014   MCV 79.3 01/21/2014   PLT 132* 01/21/2014    @LASTCHEMISTRY @  Lab Results  Component Value Date   LABCA2 27 12/27/2012    No components found with this basename:  [id number]    No results found for this basename: INR,  in the last 168 hours  Urinalysis    Component Value Date/Time   COLORURINE YELLOW  01/13/2014 [facility] 01/13/2014 1235   LABSPEC 1.004* 01/13/2014 1235   LABSPEC 1.005 08/13/2013 1530   PHURINE 6.0 01/13/2014 1235   GLUCOSEU NEGATIVE 01/13/2014 1235   GLUCOSEU Negative 08/13/2013 1530   HGBUR NEGATIVE 01/13/2014 1235   HGBUR negative 04/09/2010 1512   BILIRUBINUR NEGATIVE 01/13/2014 1235   KETONESUR NEGATIVE 01/13/2014 1235   PROTEINUR NEGATIVE 01/13/2014 1235   UROBILINOGEN 0.2 01/13/2014 1235   UROBILINOGEN 0.2 08/13/2013 1530   NITRITE NEGATIVE 01/13/2014 1235   LEUKOCYTESUR NEGATIVE 01/13/2014 1235    STUDIES: Ct Chest W Contrast  01/14/2014   CLINICAL DATA:  restaging stage IV cancerRestaging stage IV cancer  EXAM: CT CHEST, ABDOMEN AND PELVIS WITHOUT CONTRAST  TECHNIQUE: Multidetector CT imaging of the chest, abdomen and pelvis was performed following the standard protocol without IV contrast.  COMPARISON:  None.  MR CHEST MEDIAS WO/W CM dated 12/18/2013; NM PET IMAGE RESTAG (PS) SKULL BASE TO THIGH dated 03/13/2013; NM PET IMAGE RESTAG (PS) SKULL BASE TO THIGH dated 10/08/2013  FINDINGS: CT CHEST FINDINGS  The thoracic inlet is unremarkable.  A 6.1 mm lymph node is appreciated within the prevascular space. An 8.5 mm subcarinal lymph node is identified. A 10 mm left hilar lymph node is appreciated image 22 series 2.  Postsurgical changes identified within the right axilla in the region of the previously described index nodule. There is mild increased soft tissue attenuation within the surgical bed which may represent postsurgical change. Findings are again consistent with a lumpectomy within the lateral aspect of the right breast. A left-sided port a catheter is identified with tip projecting in the region of the superior vena caval right atrial junction.  The an 11 mm lingular nodule is again identified. There is slight increased peripheral spiculation below though the size is unchanged. An ill-defined area of ground-glass density projects in the anterior base of the  lingula image 23 series for which is increased and size and conspicuity measuring 8.8 mm. A small adjacent satellite nodule projects posteriorly measuring 4 mm. The 5 mm nodule within the periphery of the lingula image 20 is slightly decreased in conspicuity measuring 4 mm. The spiculated nodule within the left lower lobe image 34 series 4 is unchanged measuring 12 x 9 mm.  Stable subpleural interstitial changes identified within the anterior aspect of the right upper lobe. A small 4.2 mm nodule was developed within the anterior aspect of the right upper lobe image 16 series 4. A 7.3 mm spiculated nodule projects anteriorly within the right middle lobe image 22 series 4. A 5.5 mm nodule is also identified posteriorly within the right middle lobe seen image. The pulmonary nodule in the anterior aspect of the right middle lobe has increased in size and extends to the subpleural region with greatest confluence measuring 17 x 9 mm image 32. When compared to the previous study there has been interval development of smaller less than 5 mm size pulmonary nodules within the base of the right middle lobe. The 7 mm pulmonary nodule posteriorly within the right lower lobe image 35 series 4 has increased in size now measuring 8 mm. A 9 mm spiculated nodules appreciated centrally within the right upper lobe image 13 series 4.  No focal regions of consolidation are identified nor evidence of focal infiltrates. There is  prominence of the interstitial markings within the right and left hemithoraces.  CT ABDOMEN AND PELVIS FINDINGS  Evaluation of the liver demonstrates low attenuating ill-defined nodules within the anterior dome of the right lobe of the liver as well as adjacent to the the junction of the right and left lobes of the liver image 45 series 2. A dominant nodules appreciated within the anterior central portion of the right lobe of the liver demonstrating heterogeneous enhancement and measuring 19 x 16 mm in AP by  transverse dimensions, increased in size when correlated with dated 10/08/2013.  The spleen, adrenals, pancreas, kidneys are unremarkable.  There is no evidence of bowel obstruction, enteritis, colitis, diverticulitis, nor appendicitis.  There is no evidence of abdominal aortic aneurysm. The celiac, SMA, IMA, portal vein are opacified.  There is no evidence of abdominal free fluid, loculated fluid collections, nor masses. A 9 mm lymph node is appreciated along the gastrohepatic ligament. A 11 mm celiac plexus lymph node is identified. Image 47 series 2.  Evaluation of the pelvis demonstrates an oval shaped area of soft tissue attenuation in the posterior left adnexa measuring 3.8 x 1.9 cm in AP by transverse dimensions demonstrating Hounsfield units of 41.7. No further pelvic masses, free fluid, loculated fluid collections or adenopathy is appreciated.  Evaluation osseous structures demonstrates diffuse sclerotic density throughout the appendicular and axial skeleton. An expansile lesion is identified within the inferior pubic ramus on the right which may reflect sequela prior fracture likely pathologic or an expansile focus of metastatic disease.  IMPRESSION: 1. Progression of metastatic disease within the mediastinum and lung parenchyma. There been increased size of the lung nodules and increased numbers. Interstitial findings within the lungs which may reflect the sequela of lymph and take infiltration. Chronic interstitial fibrotic change is of diagnostic consideration. 2. Interval increase  burden of metastatic disease within the liver. 3. Diffuse metastatic disease throughout the appendicular and axial skeleton 4. Findings concerning for a soft tissue mass within the pelvis on the left which may reflect a metastatic disease or possibly an ovarian origin. 5. Re evaluation with total body PET-CT is recommended.   Electronically Signed   By: Margaree Mackintosh M.D.   On: 01/14/2014 11:40   Mr Jeri Cos QG  Contrast  01/20/2014   CLINICAL DATA:  Breast cancer.  Evaluate for metastatic disease.  EXAM: MRI HEAD WITHOUT AND WITH CONTRAST  TECHNIQUE: Multiplanar, multiecho pulse sequences of the brain and surrounding structures were obtained without and with intravenous contrast.  CONTRAST:  MultiHance 9 mL.  COMPARISON:  MR HEAD WO/W CM dated 12/18/2013; MR C SPINE WO/W CM dated 02/21/2013  FINDINGS: No acute stroke or hemorrhage. No hydrocephalus or extra-axial fluid.  Intracranial metastatic disease is re-demonstrated, as was observed on the previous MR. Subcentimeter lesions are seen in the right cerebellar hemisphere, inferior vermis, right temporal lobe, left temporal lobe, and multiple lesions throughout the subcortical white matter of left greater than right cerebral hemispheres. The dominant lesion is an osseous metastasis to the greater wing of the sphenoid on the right, measures 17 x 19 mm, unchanged in size from priors. A dural based lesion is also seen over the right frontal convexity, similar to priors. No midline shift. No impending herniation. Moderate vasogenic edema is associated with the right temporal lesion, increased from priors, and could predispose the patient to seizures. Other lesions also are roughly similar, including a spherical 7 mm lesion deep to the insula. There is slight increased edema surrounding the  right cerebellar lesion compared with priors, but there is slightly less edema surrounding the left parietal subcortical lesion. A few more lesions are visible than were previously noted on the 12/18/2013 study, but there was considerable motion on that exam.  Marrow heterogeneity in the clivus could suggest additional tumor involvement. Low T1 signal intensity bone marrow in the upper cervical region unchanged from priors.  IMPRESSION: Suspected slight progression of metastatic disease, with a few more lesions visible than were observed on 12/18/2013, as well as increased vasogenic edema  surrounding the right middle cranial fossa osseous lesion, and right cerebellar lesion. No impending herniation or midline shift.  No lesions involving the parasagittal posterior frontal cortex which might cause bilateral leg weakness.   Electronically Signed   By: Rolla Flatten M.D.   On: 01/20/2014 20:09   Mr Lumbar Spine W Wo Contrast  01/20/2014   CLINICAL DATA:  New onset of lower extremity weakness. Breast cancer.  EXAM: MRI LUMBAR SPINE WITHOUT AND WITH CONTRAST  TECHNIQUE: Multiplanar and multiecho pulse sequences of the lumbar spine were obtained without and with intravenous contrast.  CONTRAST:  19m MULTIHANCE GADOBENATE DIMEGLUMINE 529 MG/ML IV SOLN  COMPARISON:  CT ABD/PELVIS W CM dated 01/14/2014; MR HEAD WO/W CM dated 01/20/2014; NM PET IMAGE RESTAG (PS) SKULL BASE TO THIGH dated 10/08/2013  FINDINGS: The scan extends from midportion T11 through the sacrum.  Widespread marrow heterogeneity reflecting osseous metastatic disease and post treatment effect. No areas of pathologic compression deformity are evident. There is no visible epidural tumor. The alignment is anatomic.  No pelvic masses.  Bladder decompressed with Foley catheter.  No disc protrusion or spinal stenosis. Post infusion imaging demonstrates no abnormal intraspinal enhancement of the nerve roots or distal conus.  On sagittal T2 and STIR imaging, the conus appears abnormal, displaying intramedullary T2 bright signal and slight cord enlargement. An intramedullary metastasis above the field-of-view, at T10 or above, is suspected, with associated distal cord edema. There is no definite abnormal enhancement of the visualized thoracic cord or conus on post infusion imaging.  IMPRESSION: Suspected intramedullary metastasis above the field-of-view of lumbar spine MRI. The distal thoracic cord and conus appear to display T2 bright signal concerning for edema. Thoracic spine MRI without and with contrast recommended for further evaluation.  No  evidence for spinal stenosis, dropped metastases to the cauda equina, or intraspinal mass lesion in the lumbar or sacral segments.  Widespread osseous disease without pathologic compression fracture or malalignment.   Electronically Signed   By: JRolla FlattenM.D.   On: 01/20/2014 19:19   Ct Abdomen Pelvis W Contrast  01/14/2014   CLINICAL DATA:  restaging stage IV cancerRestaging stage IV cancer  EXAM: CT CHEST, ABDOMEN AND PELVIS WITHOUT CONTRAST  TECHNIQUE: Multidetector CT imaging of the chest, abdomen and pelvis was performed following the standard protocol without IV contrast.  COMPARISON:  None.  MR CHEST MEDIAS WO/W CM dated 12/18/2013; NM PET IMAGE RESTAG (PS) SKULL BASE TO THIGH dated 03/13/2013; NM PET IMAGE RESTAG (PS) SKULL BASE TO THIGH dated 10/08/2013  FINDINGS: CT CHEST FINDINGS  The thoracic inlet is unremarkable.  A 6.1 mm lymph node is appreciated within the prevascular space. An 8.5 mm subcarinal lymph node is identified. A 10 mm left hilar lymph node is appreciated image 22 series 2.  Postsurgical changes identified within the right axilla in the region of the previously described index nodule. There is mild increased soft tissue attenuation within the surgical bed which may represent  postsurgical change. Findings are again consistent with a lumpectomy within the lateral aspect of the right breast. A left-sided port a catheter is identified with tip projecting in the region of the superior vena caval right atrial junction.  The an 11 mm lingular nodule is again identified. There is slight increased peripheral spiculation below though the size is unchanged. An ill-defined area of ground-glass density projects in the anterior base of the lingula image 23 series for which is increased and size and conspicuity measuring 8.8 mm. A small adjacent satellite nodule projects posteriorly measuring 4 mm. The 5 mm nodule within the periphery of the lingula image 20 is slightly decreased in conspicuity  measuring 4 mm. The spiculated nodule within the left lower lobe image 34 series 4 is unchanged measuring 12 x 9 mm.  Stable subpleural interstitial changes identified within the anterior aspect of the right upper lobe. A small 4.2 mm nodule was developed within the anterior aspect of the right upper lobe image 16 series 4. A 7.3 mm spiculated nodule projects anteriorly within the right middle lobe image 22 series 4. A 5.5 mm nodule is also identified posteriorly within the right middle lobe seen image. The pulmonary nodule in the anterior aspect of the right middle lobe has increased in size and extends to the subpleural region with greatest confluence measuring 17 x 9 mm image 32. When compared to the previous study there has been interval development of smaller less than 5 mm size pulmonary nodules within the base of the right middle lobe. The 7 mm pulmonary nodule posteriorly within the right lower lobe image 35 series 4 has increased in size now measuring 8 mm. A 9 mm spiculated nodules appreciated centrally within the right upper lobe image 13 series 4.  No focal regions of consolidation are identified nor evidence of focal infiltrates. There is prominence of the interstitial markings within the right and left hemithoraces.  CT ABDOMEN AND PELVIS FINDINGS  Evaluation of the liver demonstrates low attenuating ill-defined nodules within the anterior dome of the right lobe of the liver as well as adjacent to the the junction of the right and left lobes of the liver image 45 series 2. A dominant nodules appreciated within the anterior central portion of the right lobe of the liver demonstrating heterogeneous enhancement and measuring 19 x 16 mm in AP by transverse dimensions, increased in size when correlated with dated 10/08/2013.  The spleen, adrenals, pancreas, kidneys are unremarkable.  There is no evidence of bowel obstruction, enteritis, colitis, diverticulitis, nor appendicitis.  There is no evidence of  abdominal aortic aneurysm. The celiac, SMA, IMA, portal vein are opacified.  There is no evidence of abdominal free fluid, loculated fluid collections, nor masses. A 9 mm lymph node is appreciated along the gastrohepatic ligament. A 11 mm celiac plexus lymph node is identified. Image 47 series 2.  Evaluation of the pelvis demonstrates an oval shaped area of soft tissue attenuation in the posterior left adnexa measuring 3.8 x 1.9 cm in AP by transverse dimensions demonstrating Hounsfield units of 41.7. No further pelvic masses, free fluid, loculated fluid collections or adenopathy is appreciated.  Evaluation osseous structures demonstrates diffuse sclerotic density throughout the appendicular and axial skeleton. An expansile lesion is identified within the inferior pubic ramus on the right which may reflect sequela prior fracture likely pathologic or an expansile focus of metastatic disease.  IMPRESSION: 1. Progression of metastatic disease within the mediastinum and lung parenchyma. There been increased size of the  lung nodules and increased numbers. Interstitial findings within the lungs which may reflect the sequela of lymph and take infiltration. Chronic interstitial fibrotic change is of diagnostic consideration. 2. Interval increase  burden of metastatic disease within the liver. 3. Diffuse metastatic disease throughout the appendicular and axial skeleton 4. Findings concerning for a soft tissue mass within the pelvis on the left which may reflect a metastatic disease or possibly an ovarian origin. 5. Re evaluation with total body PET-CT is recommended.   Electronically Signed   By: Margaree Mackintosh M.D.   On: 01/14/2014 11:40    ASSESSMENT: 63 y.o. [place], [place] woman originally from [place] with stage IV breast cancer:  (1) Status post right lumpectomy and sentinel lymph node sampling 05/16/2001 for a pT1c pN1, stage IIA invasive ductal carcinoma, grade 2, estrogen receptor 95% and progesterone  receptor 96% positive, with an MIB-1 of 11% and no HER-2 amplification by immunocytochemistry.  (2) Treated adjuvantly with cyclophosphamide, methotrexate and fluorouracil, likely for 8 cycles (data unavailable), completed February 2003.  (3) Completed radiation to the right breast and regional lymph nodes 02/12/2002.  (4) The patient apparently refused axillary lymph node dissection and antiestrogen therapy (data unavailable).  (5) Recurrent disease documented by right axillary lymph node biopsy 08/23/2012, showing a high-grade invasive ductal carcinoma which was estrogen and progesterone receptor negative, with an MIB-1 of 59%, and no HER-2 amplification; staging studies showed hyper metabolic mediastinal and left lung lesions, with no involvement of the liver or brain  (6) Abraxane every 2 weeks started 09/20/2012, with good tolerance and evidence of response, completing 12 doses 02/21/2013. Resumed 07/16/2013.  (7) Restaging studies at [facility] March 2014 and here April-May 2014 as follows:  (a) bone involvement: [facility] bone scan showed focal tracer activity in the left acetabulum, left 12th rib posteriorly, and right sixth rib posteriorly; on CT scan a 9 mm sclerotic sacral lesion was new as compared to November of 2013.  (b) [facility] CT of the chest, abdomen and pelvis showed slight decrease in size of some mediastinal lymph nodes, persistent unchanged nodularity in the right axilla, normal liver, and no new or enlarging pulmonary nodules.  (c) MRI of the cervical spine shows no obvious lesion to explain the patient's Right arm symptoms.  (d) PET scan shows multiple bone lesions (including C7), mild growth in index lymph nodes, no change in possible left lung nodules, and no liver involvement.  (7) Genetic testing obtained at [facility] 03/19/2013, results pending.  (8) Zolendronate started 02/21/2013, continuing monthly.  (9) Nonspecific myositis RUE documented at [facility] by [facility] March 2014; cervical MRI does not  explain symptoms, which are likely due to a right axillary radiculopathy.   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 I have reviewed the case also with Dr Julien Nordmann and he also feels we have nothing to offer the patient beyond pal;liative and Hospice care. Accordingly I have strongly encouraged her to transition to palliative/ Hospice care at this point. She understands attempts at resuscitation in case of a terminal event would be futile and agrees  to a DNR, which I have written.  She is aware of [facility], which she has visited in the past. She understands she will not be able to go home given her current situation and lack of 24-hour support. She is  Agreeable to [facility] and understands I can visit her there weekly as her Hospice-associated MD.  She is working on some legal papers with her brother Dorris Carnes and requested I call him at [phone] and let him know her situation. I have documented inmy exam that she is A&O x3 so there should be no question regarding compatency.  Will follow with you.   Chauncey Cruel, MD   01/21/2014 1:08 PM

## 2014-01-22 DIAGNOSIS — F411 Generalized anxiety disorder: Secondary | ICD-10-CM

## 2014-01-22 MED ORDER — FLEET ENEMA 7-19 GM/118ML RE ENEM
1.0000 | ENEMA | Freq: Every day | RECTAL | Status: DC | PRN
  Administered 2014-01-22 – 2014-01-25 (×2): 1 via RECTAL
  Filled 2014-01-22 (×2): qty 1

## 2014-01-22 NOTE — Consult Note (Signed)
[facility] Liaison: Received request from [facility] 01/21/2014 for patient interest in [facility]. Chart reviewed. Unfortunately no [facility] availability at this time. Will continue to follow until disposition determined. Will update CSW if availability changes for this patient. Thank you. Erling Conte LCSW [phone]

## 2014-01-22 NOTE — Progress Notes (Signed)
CSW received notification from [facility], Erling Conte that [facility] does not have availability at this time and unsure when bed may become available.  CSW met with pt at bedside to discuss. CSW notified pt that [facility] does not have bed available and facility is unable to give CSW a timeframe upon which a bed may become available. Pt states that she plans to stay in hospital until bed becomes available. CSW discussed that insurance guidelines will not allow this and once MD determines that pt is medically stable then pt will need to transition out of the hospital. Pt inquired about [facility] and if they had palliative care beds. CSW discussed that [facility] is for symptom management and end of life care. Pt stated, " I'm not there yet, I'm not near end of life, I am still exploring treatment options". CSW reflected with pt about conversation yesterday with this CSW and Oncologist and other medical providers. Pt continues to state that she wants to discuss with MD about treatment options. CSW discussed with pt that a palliative care consult may be beneficial in order to clarify pt goals. Pt agreeable to this.  CSW to follow up with pt following pt discussions with MD today as pt wishes are differing from discussions held yesterday.  CSW notified attending MD of discussion and recommendation that pt will likely benefit from a palliative care consult.  CSW to continue to follow to provide support and assist with pt disposition planning.  Alison Murray, MSW, [facility] [phone]

## 2014-01-22 NOTE — Progress Notes (Signed)
PROGRESS NOTE   Debbie Larsen CNO:[id number] DOB: [birth year] DOA: 01/13/2014 PCP: Melrose Nakayama, MD  Brief narrative: Debbie Larsen is an 63 y.o. female with past medical history of right breast adenocarcinoma diagnoses in 04/2001 s/p chemo and RT to right breast, high-grade metastatic carcinoma, unknown primary, bone and brain metastasis status post systemic chemo, was treated as breast cancer with Abraxane until 01/2013. Repeat PET scan on 03/13/2013 showed evidence for disease progression. Pt was admitted 01/13/2014 for progressive weakness, poor oral intake, weight loss. She has been started on Decadron on the admission but continues to have no strength in lower extremities. Her MRI lumbar and thoracic spine showed extensive metastatic disease with cord edema and per radiation oncolgy palliative radiotherapy will liekly not provide much improvement.    Assessment/Plan: Principal Problem:  Progressive generalized weakness, lower extremity weakness/ paralysis/ failure to thrive  Patient has high-grade metastatic carcinoma. She has brain and spinal cord metastasis which explains her lower extremity weakness. MRI of thoracic and lumbar spine confirms extensive metastatic lesions in the spine and cord edema. Seen by Dr. Valere Dross and the patient is not a candidate for further radiation treatment. She was taking decadron inconsitently because she reports not being able to sleep. At this time oncology recommends hospice referral. Order also placed for palliative care per patient's brother request. In regards to patient's failure to thrive, we encouraged the patient to eat more especially the food she likes. The patient is frankly paranoid and accusing her oncologist from withholding treatment and is very suspicious of all hospital personnel and treating physicians. Active Problems:  Metastatic carcinoma of undetermined primary with metastases to bones and brain  CT chest showed progression of  metastatic disease within the mediastinum and lung parenchyma, increased size of the lung nodules and number of nodules, interval increase in liver metastatic disease, soft tissue mass within the pelvis on the left which could possibly reflect metastatic disease or possibly primary ovarian origin; per oncology primary possibly breast or lung. We will proceed with hospice placement per oncology recommendations.  Bone pain  Related to bone metastases. Per oncology recommendations patient was given one dose of Zometa on 01/16/2014. Continue dexamethasone 4 mg IV daily.  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  Hypothyroidism  Continue synthroid 75 mcg daily.  Anemia / Thrombocytopenia  Secondary to chronic disease due to malignancy and sequela of chemotherapy. Hemoglobin was 7.9 on 3/22 and she has received 1 unit of PRBC transfusion.  Elevated liver enzymes  Likely due to metastatic disease.  Severe protein calorie malnutrition  Secondary to progressive malignancy. As mentioned above, we have encouraged by mouth intake. Patient refused to take feeding supplements.   Code Status: DNR/DNI  Family Communication: No family currently at the bedside. Disposition Plan: Possible residential hospice placement versus SNF.  Consultants:  Oncology (Dr. Julien Nordmann)  Rad oncology  Palliative care   Procedures:  None   Antibiotics:  None    HPI/Subjective: Debbie Larsen is extremely paranoid and anxious. She accuses her treating physicians of withholding treatment and verbalizes about possible immunotherapies and other treatments which she would like to try. She has no insight into her current state of health. Attempts to redirect her are unsuccessful. She reports ongoing poor appetite and difficulty sleeping which she is attributing to the Decadron.  Objective: Filed Vitals:   01/20/14 2023 01/21/14 0434 01/21/14 2127 01/22/14 0621  BP: 118/72 110/68 116/61 108/60  Pulse: 95 103 71  85  Temp: 98.4 F (36.9 C) 98.2 F (36.8 C) 99.1 F (37.3 C) 98.7 F (37.1 C)  TempSrc: Oral Oral Oral Oral  Resp: 18 16 18 16   Height:      Weight:      SpO2: 98% 99% 100% 100%    Intake/Output Summary (Last 24 hours) at 01/22/14 1626 Last data filed at 01/22/14 1500  Gross per 24 hour  Intake   1515 ml  Output   3400 ml  Net  -1885 ml    Exam: Gen:  NAD Cardiovascular:  RRR, No M/R/G Respiratory:  Lungs CTAB Gastrointestinal:  Abdomen soft, NT/ND, + BS Extremities:  No C/E/C  Data Reviewed: Basic Metabolic Panel:  Recent Labs Lab 01/19/14 0540 01/21/14 0616  NA 134* 126*  K 4.1 4.3  CL 101 90*  CO2 22 21  GLUCOSE 139* 174*  BUN 6 13  CREATININE 0.53 0.51  CALCIUM 8.2* 8.6   GFR Estimated Creatinine Clearance: 52.4 ml/min (by C-G formula based on Cr of 0.51).  CBC:  Recent Labs Lab 01/19/14 0540 01/21/14 0616  WBC 4.5 8.8  HGB 7.9* 10.5*  HCT 23.4* 30.3*  MCV 80.7 79.3  PLT 99* 132*   Microbiology No results found for this or any previous visit (from the past 240 hour(s)).   Procedures and Diagnostic Studies: Ct Chest W Contrast  01/14/2014   CLINICAL DATA:  restaging stage IV cancerRestaging stage IV cancer  EXAM: CT CHEST, ABDOMEN AND PELVIS WITHOUT CONTRAST  TECHNIQUE: Multidetector CT imaging of the chest, abdomen and pelvis was performed following the standard protocol without IV contrast.  COMPARISON:  None.  MR CHEST MEDIAS WO/W CM dated 12/18/2013; NM PET IMAGE RESTAG (PS) SKULL BASE TO THIGH dated 03/13/2013; NM PET IMAGE RESTAG (PS) SKULL BASE TO THIGH dated 10/08/2013  FINDINGS: CT CHEST FINDINGS  The thoracic inlet is unremarkable.  A 6.1 mm lymph node is appreciated within the prevascular space. An 8.5 mm subcarinal lymph node is identified. A 10 mm left hilar lymph node is appreciated image 22 series 2.  Postsurgical changes identified within the right axilla in the region of the previously described index nodule. There is mild increased  soft tissue attenuation within the surgical bed which may represent postsurgical change. Findings are again consistent with a lumpectomy within the lateral aspect of the right breast. A left-sided port a catheter is identified with tip projecting in the region of the superior vena caval right atrial junction.  The an 11 mm lingular nodule is again identified. There is slight increased peripheral spiculation below though the size is unchanged. An ill-defined area of ground-glass density projects in the anterior base of the lingula image 23 series for which is increased and size and conspicuity measuring 8.8 mm. A small adjacent satellite nodule projects posteriorly measuring 4 mm. The 5 mm nodule within the periphery of the lingula image 20 is slightly decreased in conspicuity measuring 4 mm. The spiculated nodule within the left lower lobe image 34 series 4 is unchanged measuring 12 x 9 mm.  Stable subpleural interstitial changes identified within the anterior aspect of the right upper lobe. A small 4.2 mm nodule was developed within the anterior aspect of the right upper lobe image 16 series 4. A 7.3 mm spiculated nodule projects anteriorly within the right middle lobe image 22 series 4. A 5.5 mm nodule is also identified posteriorly within the right middle lobe seen image. The pulmonary nodule in the anterior aspect of the right middle lobe  has increased in size and extends to the subpleural region with greatest confluence measuring 17 x 9 mm image 32. When compared to the previous study there has been interval development of smaller less than 5 mm size pulmonary nodules within the base of the right middle lobe. The 7 mm pulmonary nodule posteriorly within the right lower lobe image 35 series 4 has increased in size now measuring 8 mm. A 9 mm spiculated nodules appreciated centrally within the right upper lobe image 13 series 4.  No focal regions of consolidation are identified nor evidence of focal infiltrates.  There is prominence of the interstitial markings within the right and left hemithoraces.  CT ABDOMEN AND PELVIS FINDINGS  Evaluation of the liver demonstrates low attenuating ill-defined nodules within the anterior dome of the right lobe of the liver as well as adjacent to the the junction of the right and left lobes of the liver image 45 series 2. A dominant nodules appreciated within the anterior central portion of the right lobe of the liver demonstrating heterogeneous enhancement and measuring 19 x 16 mm in AP by transverse dimensions, increased in size when correlated with dated 10/08/2013.  The spleen, adrenals, pancreas, kidneys are unremarkable.  There is no evidence of bowel obstruction, enteritis, colitis, diverticulitis, nor appendicitis.  There is no evidence of abdominal aortic aneurysm. The celiac, SMA, IMA, portal vein are opacified.  There is no evidence of abdominal free fluid, loculated fluid collections, nor masses. A 9 mm lymph node is appreciated along the gastrohepatic ligament. A 11 mm celiac plexus lymph node is identified. Image 47 series 2.  Evaluation of the pelvis demonstrates an oval shaped area of soft tissue attenuation in the posterior left adnexa measuring 3.8 x 1.9 cm in AP by transverse dimensions demonstrating Hounsfield units of 41.7. No further pelvic masses, free fluid, loculated fluid collections or adenopathy is appreciated.  Evaluation osseous structures demonstrates diffuse sclerotic density throughout the appendicular and axial skeleton. An expansile lesion is identified within the inferior pubic ramus on the right which may reflect sequela prior fracture likely pathologic or an expansile focus of metastatic disease.  IMPRESSION: 1. Progression of metastatic disease within the mediastinum and lung parenchyma. There been increased size of the lung nodules and increased numbers. Interstitial findings within the lungs which may reflect the sequela of lymph and take  infiltration. Chronic interstitial fibrotic change is of diagnostic consideration. 2. Interval increase  burden of metastatic disease within the liver. 3. Diffuse metastatic disease throughout the appendicular and axial skeleton 4. Findings concerning for a soft tissue mass within the pelvis on the left which may reflect a metastatic disease or possibly an ovarian origin. 5. Re evaluation with total body PET-CT is recommended.   Electronically Signed   By: Margaree Mackintosh M.D.   On: 01/14/2014 11:40   Mr Jeri Cos LZ Contrast  01/20/2014   CLINICAL DATA:  Breast cancer.  Evaluate for metastatic disease.  EXAM: MRI HEAD WITHOUT AND WITH CONTRAST  TECHNIQUE: Multiplanar, multiecho pulse sequences of the brain and surrounding structures were obtained without and with intravenous contrast.  CONTRAST:  MultiHance 9 mL.  COMPARISON:  MR HEAD WO/W CM dated 12/18/2013; MR C SPINE WO/W CM dated 02/21/2013  FINDINGS: No acute stroke or hemorrhage. No hydrocephalus or extra-axial fluid.  Intracranial metastatic disease is re-demonstrated, as was observed on the previous MR. Subcentimeter lesions are seen in the right cerebellar hemisphere, inferior vermis, right temporal lobe, left temporal lobe, and multiple lesions throughout  the subcortical white matter of left greater than right cerebral hemispheres. The dominant lesion is an osseous metastasis to the greater wing of the sphenoid on the right, measures 17 x 19 mm, unchanged in size from priors. A dural based lesion is also seen over the right frontal convexity, similar to priors. No midline shift. No impending herniation. Moderate vasogenic edema is associated with the right temporal lesion, increased from priors, and could predispose the patient to seizures. Other lesions also are roughly similar, including a spherical 7 mm lesion deep to the insula. There is slight increased edema surrounding the right cerebellar lesion compared with priors, but there is slightly less  edema surrounding the left parietal subcortical lesion. A few more lesions are visible than were previously noted on the 12/18/2013 study, but there was considerable motion on that exam.  Marrow heterogeneity in the clivus could suggest additional tumor involvement. Low T1 signal intensity bone marrow in the upper cervical region unchanged from priors.  IMPRESSION: Suspected slight progression of metastatic disease, with a few more lesions visible than were observed on 12/18/2013, as well as increased vasogenic edema surrounding the right middle cranial fossa osseous lesion, and right cerebellar lesion. No impending herniation or midline shift.  No lesions involving the parasagittal posterior frontal cortex which might cause bilateral leg weakness.   Electronically Signed   By: Rolla Flatten M.D.   On: 01/20/2014 20:09   Mr Thoracic Spine W Contrast  01/21/2014   CLINICAL DATA:  63 year old female with stage IV breast cancer. New onset lower extremity weakness and unable to move the lower extremities. Initial encounter.  EXAM: MRI THORACIC SPINE WITH CONTRAST  TECHNIQUE: Multiplanar and multiecho pulse sequences of the thoracic spine were obtained with intravenous contrast.  CONTRAST:  1mL MULTIHANCE GADOBENATE DIMEGLUMINE 529 MG/ML IV SOLN  COMPARISON:  Lumbar MRI 01/20/2014.  FINDINGS: Diffusely abnormal bone marrow signal. Subsequently, cervical vertebral delineation is difficult on the sagittal scout view. The numbering system here agrees with that on the recent lumbar exam.  Diffuse thoracic spine and widespread posterior rib metastases. Still, no thoracic epidural or extraosseous tumor identified.  Intra-axial partially exophytic solidly enhancing mass of the thoracic spinal cord centered at the T8 level measures 7 x 9 x 17 mm (AP by transverse by CC) and is associated with widespread thoracic spinal cord edema. Only a portion of the conus medullaris, and the ventral lower cervical cord is spared. Mild cord  expansion suspected below the level of the tumor.  There is also a much smaller (2 mm) enhancing cord metastasis at the T2-T3 level just to the right of midline (series 10, image 8).  No definite cauda equina tumor. No abnormal dural thickening or enhancement.  Otherwise, bilateral lung nodules are suggested, and central right liver mass may have increased (series 7, image 39). See Chest abdomen pelvis CT 01/14/2014.  IMPRESSION: 1. 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 Associated spinal cord edema is widespread. 2. Diffuse osseous metastatic disease. Study discussed by telephone with Dr. [name] on 01/21/2014 at 15:29 .   Electronically Signed   By: Lars Pinks M.D.   On: 01/21/2014 15:31   Mr Lumbar Spine W Wo Contrast  01/20/2014   CLINICAL DATA:  New onset of lower extremity weakness. Breast cancer.  EXAM: MRI LUMBAR SPINE WITHOUT AND WITH CONTRAST  TECHNIQUE: Multiplanar and multiecho pulse sequences of the lumbar spine were obtained without and with intravenous contrast.  CONTRAST:  33mL MULTIHANCE GADOBENATE DIMEGLUMINE 529 MG/ML IV SOLN  COMPARISON:  CT ABD/PELVIS W CM dated 01/14/2014; MR HEAD WO/W CM dated 01/20/2014; NM PET IMAGE RESTAG (PS) SKULL BASE TO THIGH dated 10/08/2013  FINDINGS: The scan extends from midportion T11 through the sacrum.  Widespread marrow heterogeneity reflecting osseous metastatic disease and post treatment effect. No areas of pathologic compression deformity are evident. There is no visible epidural tumor. The alignment is anatomic.  No pelvic masses.  Bladder decompressed with Foley catheter.  No disc protrusion or spinal stenosis. Post infusion imaging demonstrates no abnormal intraspinal enhancement of the nerve roots or distal conus.  On sagittal T2 and STIR imaging, the conus appears abnormal, displaying intramedullary T2 bright signal and slight cord enlargement. An intramedullary metastasis above the field-of-view, at T10 or  above, is suspected, with associated distal cord edema. There is no definite abnormal enhancement of the visualized thoracic cord or conus on post infusion imaging.  IMPRESSION: Suspected intramedullary metastasis above the field-of-view of lumbar spine MRI. The distal thoracic cord and conus appear to display T2 bright signal concerning for edema. Thoracic spine MRI without and with contrast recommended for further evaluation.  No evidence for spinal stenosis, dropped metastases to the cauda equina, or intraspinal mass lesion in the lumbar or sacral segments.  Widespread osseous disease without pathologic compression fracture or malalignment.   Electronically Signed   By: Rolla Flatten M.D.   On: 01/20/2014 19:19   Ct Abdomen Pelvis W Contrast  01/14/2014   CLINICAL DATA:  restaging stage IV cancerRestaging stage IV cancer  EXAM: CT CHEST, ABDOMEN AND PELVIS WITHOUT CONTRAST  TECHNIQUE: Multidetector CT imaging of the chest, abdomen and pelvis was performed following the standard protocol without IV contrast.  COMPARISON:  None.  MR CHEST MEDIAS WO/W CM dated 12/18/2013; NM PET IMAGE RESTAG (PS) SKULL BASE TO THIGH dated 03/13/2013; NM PET IMAGE RESTAG (PS) SKULL BASE TO THIGH dated 10/08/2013  FINDINGS: CT CHEST FINDINGS  The thoracic inlet is unremarkable.  A 6.1 mm lymph node is appreciated within the prevascular space. An 8.5 mm subcarinal lymph node is identified. A 10 mm left hilar lymph node is appreciated image 22 series 2.  Postsurgical changes identified within the right axilla in the region of the previously described index nodule. There is mild increased soft tissue attenuation within the surgical bed which may represent postsurgical change. Findings are again consistent with a lumpectomy within the lateral aspect of the right breast. A left-sided port a catheter is identified with tip projecting in the region of the superior vena caval right atrial junction.  The an 11 mm lingular nodule is again  identified. There is slight increased peripheral spiculation below though the size is unchanged. An ill-defined area of ground-glass density projects in the anterior base of the lingula image 23 series for which is increased and size and conspicuity measuring 8.8 mm. A small adjacent satellite nodule projects posteriorly measuring 4 mm. The 5 mm nodule within the periphery of the lingula image 20 is slightly decreased in conspicuity measuring 4 mm. The spiculated nodule within the left lower lobe image 34 series 4 is unchanged measuring 12 x 9 mm.  Stable subpleural interstitial changes identified within the anterior aspect of the right upper lobe. A small 4.2 mm nodule was developed within the anterior aspect of the right upper lobe image 16 series 4. A 7.3 mm spiculated nodule projects anteriorly within the right middle lobe image 22 series 4. A 5.5 mm nodule  is also identified posteriorly within the right middle lobe seen image. The pulmonary nodule in the anterior aspect of the right middle lobe has increased in size and extends to the subpleural region with greatest confluence measuring 17 x 9 mm image 32. When compared to the previous study there has been interval development of smaller less than 5 mm size pulmonary nodules within the base of the right middle lobe. The 7 mm pulmonary nodule posteriorly within the right lower lobe image 35 series 4 has increased in size now measuring 8 mm. A 9 mm spiculated nodules appreciated centrally within the right upper lobe image 13 series 4.  No focal regions of consolidation are identified nor evidence of focal infiltrates. There is prominence of the interstitial markings within the right and left hemithoraces.  CT ABDOMEN AND PELVIS FINDINGS  Evaluation of the liver demonstrates low attenuating ill-defined nodules within the anterior dome of the right lobe of the liver as well as adjacent to the the junction of the right and left lobes of the liver image 45 series 2.  A dominant nodules appreciated within the anterior central portion of the right lobe of the liver demonstrating heterogeneous enhancement and measuring 19 x 16 mm in AP by transverse dimensions, increased in size when correlated with dated 10/08/2013.  The spleen, adrenals, pancreas, kidneys are unremarkable.  There is no evidence of bowel obstruction, enteritis, colitis, diverticulitis, nor appendicitis.  There is no evidence of abdominal aortic aneurysm. The celiac, SMA, IMA, portal vein are opacified.  There is no evidence of abdominal free fluid, loculated fluid collections, nor masses. A 9 mm lymph node is appreciated along the gastrohepatic ligament. A 11 mm celiac plexus lymph node is identified. Image 47 series 2.  Evaluation of the pelvis demonstrates an oval shaped area of soft tissue attenuation in the posterior left adnexa measuring 3.8 x 1.9 cm in AP by transverse dimensions demonstrating Hounsfield units of 41.7. No further pelvic masses, free fluid, loculated fluid collections or adenopathy is appreciated.  Evaluation osseous structures demonstrates diffuse sclerotic density throughout the appendicular and axial skeleton. An expansile lesion is identified within the inferior pubic ramus on the right which may reflect sequela prior fracture likely pathologic or an expansile focus of metastatic disease.  IMPRESSION: 1. Progression of metastatic disease within the mediastinum and lung parenchyma. There been increased size of the lung nodules and increased numbers. Interstitial findings within the lungs which may reflect the sequela of lymph and take infiltration. Chronic interstitial fibrotic change is of diagnostic consideration. 2. Interval increase  burden of metastatic disease within the liver. 3. Diffuse metastatic disease throughout the appendicular and axial skeleton 4. Findings concerning for a soft tissue mass within the pelvis on the left which may reflect a metastatic disease or possibly an  ovarian origin. 5. Re evaluation with total body PET-CT is recommended.   Electronically Signed   By: Margaree Mackintosh M.D.   On: 01/14/2014 11:40    Scheduled Meds: . B-complex with vitamin C  1 tablet Oral Daily  . dexamethasone  4 mg Intravenous Q24H  . feeding supplement (RESOURCE BREEZE)  1 Container Oral TID BM  . levothyroxine  75 mcg Oral QAC breakfast  . multivitamin with minerals  1 tablet Oral Daily  . zolpidem  5 mg Oral QHS   Continuous Infusions: . sodium chloride 50 mL/hr at 01/22/14 1001    Time spent: 35 minutes with > 50% of time discussing current diagnostic test results, clinical impression  and plan of care.    LOS: 9 days   [facility] Pager [phone]. If unable to reach me by pager, please call my cell phone at [phone].  *Please note that the hospitalists switch teams on Wednesdays. Please call the flow manager at [phone] if you are having difficulty reaching the hospitalist taking care of this patient as she can update you and provide the most up-to-date pager number of provider caring for the patient. If 8PM-8AM, please contact night-coverage at [email or web address], password [facility]  01/22/2014, 4:26 PM    **Disclaimer: This note was dictated with voice recognition software. Similar sounding words can inadvertently be transcribed and this note may contain transcription errors which may not have been corrected upon publication of note.**

## 2014-01-22 NOTE — Progress Notes (Signed)
CSW received notification from [facility], Erling Conte that a bed became available at [facility] for tomorrow as someone scheduled to admit had changed their mind.  CSW met with pt at bedside to discuss. CSW notified pt that [facility] had bed available tomorrow if pt chooses to focus on her comfort as we discussed yesterday. Pt continue to report that she would like to look into possible immunotherapies and other treatments. Pt has no insight into her current state of health  extremely paranoid and anxious. She accuses her treating physicians of withholding treatment and verbalizes about possible immunotherapies and other treatments which she would like to try. She has no insight into her current state of health stating that she has to get stronger before leaving the hospital even though it has been thoroughly documented per radiation oncologist that there is no realistic chance that she will ever become ambulatory even with spinal irradiation. Attempts to redirect pt are unsuccessful.  Pt is agreeable to speak with palliative medicine team. CSW discussed that [facility], Erling Conte could contact pt to provide further information about [facility] and pt was open to receiving this information.  CSW to follow up with pt following recommendations from palliative medicine meeting and CSW notified [facility], Erling Conte that pt is agreeable to [facility] contacting her to provide information.  Alison Murray, MSW, [place] [facility] [phone]

## 2014-01-22 NOTE — Progress Notes (Addendum)
Physical Therapy Treatment Patient Details Name: Debbie Larsen MRN: [id number] DOB: [birth year] Today's Date: 01/22/2014    History of Present Illness 63 y.o. female with past medical history of right breast adenocarcinoma diagnoses in 04/2001 s/p chemo and RT to right breast, high-grade metastatic carcinoma.. Pt was admitted 01/13/2014 for progressive weakness, poor oral intake, weight loss. . Pt now has completely flaccid LEs.    PT Comments    Pt assisted with transfers to/from w/c and performed w/c propulsion in hallway.  Follow Up Recommendations  SNF     Equipment Recommendations  Wheelchair (measurements PT);Wheelchair cushion (measurements PT)    Recommendations for Other Services       Precautions / Restrictions Precautions Precautions: Fall Precaution Comments: new BLE weakness-no movement-paraplegia    Mobility  Bed Mobility Overal bed mobility: +2 for physical assistance;Needs Assistance Bed Mobility: Supine to Sit     Supine to sit: Total assist;+2 for physical assistance        Transfers Overall transfer level: Needs assistance   Transfers: Anterior-Posterior Transfer;Lateral/Scoot Transfers       Anterior-Posterior transfers: +2 physical assistance;Total assist  Lateral/Scoot Transfers: Total assist General transfer comment: anterior-posterior transfer from bed to w/c however pt with poor trunk control and unable to assist using UE, squat pivot from w/c to recliner  Ambulation/Gait                 Hotel manager mobility: Yes Wheelchair propulsion: Both upper extremities Wheelchair parts: Needs assistance Distance: 350 Wheelchair Assistance Details (indicate cue type and reason): max verbal cues for technique, pt with difficulty maintaining straight path due to tendency for longer L UE strokes, cues to correct however pt continues to display difficulty with this and tends to  propel backwards to readjust ( R UE post radiation neuropathy)  Modified Rankin (Stroke Patients Only)       Balance                                    Cognition Arousal/Alertness: Awake/alert Behavior During Therapy: WFL for tasks assessed/performed Overall Cognitive Status:  (Pt continues to not acknowledge severity of disease, asking what can be done to get the feeling back in her legs.)                      Exercises Other Exercises Other Exercises: pt attempting to use leg lifter to assist with exercises however pt with flaccid LEs so educated to have someone assist her for better control and skin protection, assisted pt with 10 HS each LE    General Comments        Pertinent Vitals/Pain No specific c/o pain, happy to be out of bed, repositioned in recliner Educated on LE positioning    Home Living                      Prior Function            PT Goals (current goals can now be found in the care plan section) Progress towards PT goals: Progressing toward goals    Frequency  Min 3X/week    PT Plan Current plan remains appropriate    End of Session Equipment Utilized During Treatment: Gait belt Activity Tolerance: Patient tolerated treatment well Patient left: with call bell/phone within reach;with  nursing/sitter in room;in chair     Time: [phone] PT Time Calculation (min): 48 min  Charges:  $Therapeutic Activity: 8-22 mins $Wheel Chair Management: 23-37 mins                    G Codes:      Debbie Larsen,Debbie Larsen 02-08-2014, 2:01 PM Carmelia Bake, PT, DPT Feb 08, 2014 Pager: [phone]

## 2014-01-23 DIAGNOSIS — R748 Abnormal levels of other serum enzymes: Secondary | ICD-10-CM

## 2014-01-23 MED ORDER — DEXAMETHASONE SODIUM PHOSPHATE 4 MG/ML IJ SOLN
4.0000 mg | Freq: Two times a day (BID) | INTRAMUSCULAR | Status: DC
Start: 2014-01-23 — End: 2014-01-28
  Administered 2014-01-23 – 2014-01-28 (×11): 4 mg via INTRAVENOUS
  Filled 2014-01-23 (×13): qty 1

## 2014-01-23 MED ORDER — DIAZEPAM 5 MG/ML IJ SOLN
2.5000 mg | Freq: Every evening | INTRAMUSCULAR | Status: DC | PRN
  Administered 2014-01-24 – 2014-01-27 (×5): 2.5 mg via INTRAVENOUS
  Filled 2014-01-23 (×5): qty 2

## 2014-01-23 MED ORDER — GABAPENTIN 100 MG PO CAPS
100.0000 mg | ORAL_CAPSULE | Freq: Two times a day (BID) | ORAL | Status: DC
  Administered 2014-01-23 – 2014-01-28 (×11): 100 mg via ORAL
  Filled 2014-01-23 (×12): qty 1

## 2014-01-23 MED ORDER — DEXAMETHASONE SODIUM PHOSPHATE 4 MG/ML IJ SOLN
4.0000 mg | Freq: Two times a day (BID) | INTRAMUSCULAR | Status: DC
  Filled 2014-01-23: qty 1

## 2014-01-23 MED ORDER — SENNOSIDES-DOCUSATE SODIUM 8.6-50 MG PO TABS
2.0000 | ORAL_TABLET | Freq: Every evening | ORAL | Status: DC | PRN
  Administered 2014-01-24 – 2014-01-25 (×2): 2 via ORAL
  Filled 2014-01-23 (×2): qty 2
  Filled 2014-01-23: qty 1

## 2014-01-23 NOTE — Progress Notes (Signed)
Nutrition Brief Note  -Followed up with patient and RN concerning tolerance of MuscleMilk Supplement. Pt reported disliking the taste as it induced nausea/gagging episode -Has been trying to eat more, enjoying taste of Resource Breeze -Pt's brother preparing drinks of honey/milk/tumeric, which pt slowly drinking throughout day -Provided pt with [employer] sandwich with RN approval to promote appetite -Pt very appreciative of nutrition visit; continued to encourage PO and supplement intake as tolerated   Will continue to monitor per protocols. Please re-consult as needed.  Atlee Abide MS RD LDN Clinical Dietitian [phone]

## 2014-01-23 NOTE — Consult Note (Addendum)
[facility] Face-to-Face Psychiatry Consult   Reason for Consult:  Capacity Referring Physician:  Dr Rockne Menghini  Debbie Larsen is an 63 y.o. female. Total Time spent with patient: 20 minutes  Assessment: AXIS I:  Anxiety disorder due to general medical condition AXIS II:  Deferred AXIS III:   Past Medical History  Diagnosis Date  . 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 Hypothyroid   . Enlarged lymph nodes   . 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  . S/P radiation therapy     Right breast/regional lymph nodes/ 5040 cGy / 28 fractions/ bost of 1200 cGy/ 6 Fractions  . Status post chemotherapy     6 cycles of CMF completed on 12/12/2001  . Right shoulder pain     started late 2012  . Dry cough     intermittent  . SOB (shortness of breath)   . Use of letrozole (Femara) 03/14/2013  . Cholelithiasis   . Anemia   . Stroke   . BREAST CANCER 04/2001    s/p radiation therapy, chemotherapy and lumpectomy  . Metastasis to bone 03/13/13    PET scan results  . Metastasis to bone     breast primary  . Cancer     brain mets  . Lung cancer 10/08/2013  . 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   Supportive therapy provided about ongoing stressors. Discussed crisis plan, support from social network, calling 911, coming to the Emergency Department, and calling Suicide Hotline.  Subjective:   Debbie Larsen is a 63 y.o. female patient admitted with weakness and inability to walk.  HPI:  Patient seen chart reviewed the patient a 63 year old [place] divorced female who was admitted because of weakness and inability to walk.  Patient has history of breast cancer and recently her cancer has spread to the spine.  Patient is not happy with the  treatment.  Patient felt that her treatment has been neglected.  She is getting palliative therapy because of progressive metastasis.  Patient denies any previous history of psychiatric inpatient treatment, depression or any suicidal attempt she was referred to [facility] but patient refused to go.  Consult was called for capacity.  Patient denies any hallucination, crying spells, insomnia or any agitation.  She has not taken any psychotropic medication in the past.  Patient understands to her illness they will and she can able to participate in her treatment plan.  Patient does not want to give up and liked to fight with cancer.  She has no memory impairment, cognitive deficit or any manic like symptoms.  She lives by herself however she has not social network.  Her brother live in [place] and her son lives in [place].  Patient is divorced 20 years ago.     Past Psychiatric History: Past Medical History  Diagnosis Date  . 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 Hypothyroid   . Enlarged lymph nodes   . 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  . S/P radiation therapy     Right breast/regional lymph nodes/ 5040 cGy / 28 fractions/ bost of 1200 cGy/ 6 Fractions  .  Status post chemotherapy     6 cycles of CMF completed on 12/12/2001  . Right shoulder pain     started late 2012  . Dry cough     intermittent  . SOB (shortness of breath)   . Use of letrozole (Femara) 03/14/2013  . Cholelithiasis   . Anemia   . Stroke   . BREAST CANCER 04/2001    s/p radiation therapy, chemotherapy and lumpectomy  . Metastasis to bone 03/13/13    PET scan results  . Metastasis to bone     breast primary  . Cancer     brain mets  . Lung cancer 10/08/2013  . 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 She has never used smokeless tobacco. She reports that she does not drink  alcohol or use illicit drugs. Family History  Problem Relation Age of Onset  . Diabetes Mother   . Hypertension Father   . Hypertension Sister   . Hypothyroidism Brother   . Cancer Neg Hx      Living Arrangements: Alone   Abuse/Neglect [facility]) Physical Abuse: Denies Verbal Abuse: Denies Sexual Abuse: Denies Allergies:  No Known Allergies  ACT Assessment Complete:  No:   Past Psychiatric History: No past psychiatric history, suicidal attempt on any psychotropic medication.  Place of Residence:  Lives by herself Marital Status:  Divorced Employed/Unemployed:  Unemployed Family Supports:  Some Objective: Blood pressure 120/62, pulse 82, temperature 98.6 F (37 C), temperature source Oral, resp. rate 20, height 5' (1.524 m), weight 105 lb 4.8 oz (47.764 kg), SpO2 100.00%.Body mass index is 20.57 kg/(m^2). Results for orders placed during the hospital encounter of 01/13/14 (from the past 72 hour(s))  CBC     Status: Abnormal   Collection Time    01/21/14  6:16 AM      Result Value Ref Range   WBC 8.8  4.0 - 10.5 K/uL   RBC 3.82 (*) 3.87 - 5.11 MIL/uL   Hemoglobin 10.5 (*) 12.0 - 15.0 g/dL   HCT 30.3 (*) 36.0 - 46.0 %   MCV 79.3  78.0 - 100.0 fL   MCH 27.5  26.0 - 34.0 pg   MCHC 34.7  30.0 - 36.0 g/dL   RDW 15.7 (*) 11.5 - 15.5 %   Platelets 132 (*) 150 - 400 K/uL  BASIC METABOLIC PANEL     Status: Abnormal   Collection Time    01/21/14  6:16 AM      Result Value Ref Range   Sodium 126 (*) 137 - 147 mEq/L   Potassium 4.3  3.7 - 5.3 mEq/L   Chloride 90 (*) 96 - 112 mEq/L   CO2 21  19 - 32 mEq/L   Glucose, Bld 174 (*) 70 - 99 mg/dL   BUN 13  6 - 23 mg/dL   Creatinine, Ser 0.51  0.50 - 1.10 mg/dL   Calcium 8.6  8.4 - 10.5 mg/dL   GFR calc non Af Amer >90  >90 mL/min   GFR calc Af Amer >90  >90 mL/min   Comment: (NOTE)     The eGFR has been calculated using the CKD EPI equation.     This calculation has not been validated in all clinical situations.     eGFR's  persistently <90 mL/min signify possible Chronic Kidney     Disease.   Labs are reviewed.  Current Facility-Administered Medications  Medication Dose Route Frequency Provider Last  Rate Last Dose  . 0.9 %  sodium chloride infusion   Intravenous Continuous Caren Griffins, MD 50 mL/hr at 01/23/14 0549 1,000 mL at 01/23/14 0549  . acetaminophen (TYLENOL) tablet 650 mg  650 mg Oral Q6H PRN Robbie Lis, MD   650 mg at 01/20/14 0820  . alum & mag hydroxide-simeth (MAALOX/MYLANTA) 200-200-20 MG/5ML suspension 15 mL  15 mL Oral Q4H PRN Robbie Lis, MD      . B-complex with vitamin C tablet 1 tablet  1 tablet Oral Daily Costin Karlyne Greenspan, MD   1 tablet at 01/23/14 0935  . dexamethasone (DECADRON) injection 4 mg  4 mg Intravenous Q12H Christina P Rama, MD      . diazepam (VALIUM) injection 2.5 mg  2.5 mg Intravenous QHS,MR X 1 Acquanetta Chain, DO      . feeding supplement (RESOURCE BREEZE) (RESOURCE BREEZE) liquid 1 Container  1 Container Oral TID BM Robbie Lis, MD   1 Container at 01/21/14 2125  . gabapentin (NEURONTIN) capsule 100 mg  100 mg Oral BID Acquanetta Chain, DO   100 mg at 01/23/14 1124  . levothyroxine (SYNTHROID, LEVOTHROID) tablet 75 mcg  75 mcg Oral QAC breakfast Caren Griffins, MD   75 mcg at 01/23/14 8333  . magnesium hydroxide (MILK OF MAGNESIA) suspension 30 mL  30 mL Oral Daily PRN Rhetta Mura Schorr, NP   30 mL at 01/16/14 2144  . multivitamin with minerals tablet 1 tablet  1 tablet Oral Daily Costin Karlyne Greenspan, MD   1 tablet at 01/23/14 0935  . prochlorperazine (COMPAZINE) tablet 10 mg  10 mg Oral Q6H PRN Costin Karlyne Greenspan, MD      . senna-docusate (Senokot-S) tablet 2 tablet  2 tablet Oral QHS PRN Acquanetta Chain, DO      . sodium phosphate (FLEET) 7-19 GM/118ML enema 1 enema  1 enema Rectal Daily PRN Chauncey Cruel, MD   1 enema at 01/22/14 1217   Facility-Administered Medications Ordered in Other Encounters  Medication Dose Route Frequency Provider Last  Rate Last Dose  . heparin lock flush 100 unit/mL  500 Units Intravenous Once Chauncey Cruel, MD      . sodium chloride 0.9 % injection 10 mL  10 mL Intravenous PRN Chauncey Cruel, MD      . sodium chloride 0.9 % injection 10 mL  10 mL Intravenous PRN Chauncey Cruel, MD   10 mL at 07/09/13 1717  . sodium chloride 0.9 % injection 10 mL  10 mL Intracatheter PRN Chauncey Cruel, MD   10 mL at 09/03/13 1644    Psychiatric Specialty Exam:     Blood pressure 120/62, pulse 82, temperature 98.6 F (37 C), temperature source Oral, resp. rate 20, height 5' (1.524 m), weight 105 lb 4.8 oz (47.764 kg), SpO2 100.00%.Body mass index is 20.57 kg/(m^2).  General Appearance: Casual  Eye Contact::  Fair  Speech:  Slow  Volume:  Decreased  Mood:  Anxious  Affect:  Congruent  Thought Process:  Logical  Orientation:  Full (Time, Place, and Person)  Thought Content:  WDL  Suicidal Thoughts:  No  Homicidal Thoughts:  No  Memory:  Immediate;   Good Recent;   Good Remote;   Good  Judgement:  Intact  Insight:  Fair  Psychomotor Activity:  Normal and Decreased  Concentration:  Fair  Recall:  [employer] of Knowledge:Fair  Language: Fair  Akathisia:  No  Handed:  Right  AIMS (if indicated):     Assets:  Desire for Improvement  Sleep:      Musculoskeletal: Strength & Muscle Tone: within normal limits Gait & Station: Patient is lying on the bed Patient leans: N/A  Treatment Plan Summary: The patient has capacity to participate in her treatment plan. please call [phone] if you have any further questions.   ARFEEN,SYED T. 01/23/2014 5:11 PM

## 2014-01-23 NOTE — Consult Note (Signed)
Palliative Medicine Team at [facility]  Date: 01/23/2014   Patient Name: Debbie Larsen  DOB: Jun 06, 1951  MRN: [id number]  Age / Sex: 63 y.o., female   PCP: Debbie Morita, MD Referring Physician: Venetia Maxon Rama, MD  HPI/Reason for Consultation: Debbie Larsen is a 63 year old woman originally from [place] who had a primary breast cancer diagnosed in 2002 and underwent lumpectomy, radiation and chemotherapy completed in 2003. She was in remission/cancer free until 08/2012 when she presented with recurrent disease in her right axillary lymph nodes that showed high grade, receptor negative breast cancer for which she underwent additional chemotherapy and and was also evaluated at [facility] 01/2013 and restaging and further biospies revealed a second primary cancer that was likely lung cancer with different genetic markers positive that previous recurrence in breast. Further staging imagining obtained 01/09/2014 showed brain mets, bone mets and liver mets. She has completed whole brain radiation. She was admitted on 3/16 with acute onset lower extremity flaid paralysis found to be associated with spinal cord lesions and cord edema from metastatic disease.   Debbie Larsen has been struggling with the prognostic news that she has received during this hospitalization. PMT asked to consult for goals of care.  Primary issues identified by her treatment team:  Unrealistic requests for treatments ie. Bone Marrow Transplant, Immunotherapy, wants clinical trial information  High levels of mistrust-thinking she has been not treated correctly-identifies with TV celebrities who have "gotten better". Paranoia evident.  Denial regarding her prognosis and irreversible functional status.  She has no family locally, she will need ATC care.  Participants in Discussion: Patient alone.  Goals/Summary of Discussion:  I had a long discussion with Debbie Larsen regarding her current condition and listened carefully to her pleas for  treatment- "I think I have more life to live. My parents told me they gave me life and to never give up on life". She described her perception of her care over the past few months and years- how she desperately wants to go back and do things differently- her anger towards providers and being mistreated is really her only way of off loading the injustice of her whole situation in having terminal cancer.  For today I provided empathetic listening..I did specifically tell her that I would not support false hope in fairness to her but that I was open to supporting and advocating for that things that were possible. Furthermore I explained that people with her advanced cancer often live longer with hospice that with potentially toxic or deadly high risk interventions. I affirmed that my focus was helping her live, not to die. I strongly suspect unfinished family business. He family is coming this weekend. I have agreed to do a family meeting with all members-and try to phone in her son.  Code Status:  DNR  Scope of Treatment:  Full scope, she desires any treatment that we will offer her but also requests a high level of involvement and decision making in those choices.  Active Symptoms 1. Pain, neuropathic mostly in her right upper arm 2. Existential Suffering  Plan:  Family Meeting Saturday Afternoon 3/28  Increase her Decadron IV  Scheduled her Neurontin 100mg  TID  Psych consult may be helpful for determining if psychiatric issues affecting her coping, but I found her to have full capacity at visit- but I did not think at all that she decisions were good ones or rational.  Disposition will need to be skilled care-will await family involvement.  5. Psychosocial  Spiritual Asssessment/Interventions: The patient is divorced and lives by herself. Her son Debbie Larsen lives in [place], where he works as an Administrator, sports. The patient tells me her son lives with 3 roommates" there is no room for me  there".  The patient has no grandchildren. She attends the local [employer]. She has a brother Debbie Larsen who helps her with legal affairs [phone].  I agreed to provide her with educational materials to read on Hospice and also regarding Clinical Trials so that she will better understand that this is not just out team saying that this is not a possibility.   Time: 70 minutes. 10-11:10 Greater than 50%  of this time was spent counseling and coordinating care related to the above assessment and plan.  Signed by: Acquanetta Chain, DO  01/23/2014, 1:32 PM  Please contact Palliative Medicine Team phone at [phone] for questions and concerns.

## 2014-01-23 NOTE — Progress Notes (Signed)
Occupational Therapy Treatment Patient Details Name: Debbie Larsen MRN: [id number] DOB: 1951/02/17 Today's Date: 01/23/2014    History of present illness 63 y.o. female with past medical history of right breast adenocarcinoma diagnoses in 04/2001 s/p chemo and RT to right breast, high-grade metastatic carcinoma.. Pt was admitted 01/13/2014 for progressive weakness, poor oral intake, weight loss. . Pt now has completely flaccid LEs.   OT comments  Worked on practicing and reinforcing theraband and UE exercises for bilateral UE. Pt tolerating well and pleased with exercises and feels she is getting her UEs stronger. Issued handout and reviewed with pt to help with recalling all exercises.   Follow Up Recommendations  SNF;Supervision/Assistance - 24 hour    Equipment Recommendations  None recommended by OT    Recommendations for Other Services      Precautions / Restrictions Precautions Precautions: Fall Precaution Comments: new BLE weakness-no movement-paraplegia Restrictions Weight Bearing Restrictions: No       Mobility Bed Mobility                  Transfers                      Balance                                   ADL                            Vision                     Perception     Praxis      Cognition   Behavior During Therapy: WFL for tasks assessed/performed Overall Cognitive Status: Within Functional Limits for tasks assessed                       Extremity/Trunk Assessment               Exercises Other Exercises Other Exercises: Issued exercise handout for theraband and other exercises and added in some exercises in written out form. Pt performed X 10 reps of shoulder flexion with yellow band L UE, X 15 R shoulder flexion AROM, bilateral elbow flexion/extension with band X 10, L UE abduction X 10 with band. Tolerated all well. Pt now reaching L UE into greater shoulder flexion  AROM (about 140-150 degrees). Pt states she feels bilateral UE are stronger and therapy ball is helping with grip strengthening.      General Comments      Pertinent Vitals/ Pain       No complaint of  Home Living                                          Prior Functioning/Environment              Frequency Min 2X/week     Progress Toward Goals  OT Goals(current goals can now be found in the care plan section)  Progress towards OT goals: Progressing toward goals     Plan Discharge plan remains appropriate    End of Session Equipment Utilized During Treatment: Other (comment) (theraband)  Activity Tolerance Patient tolerated treatment well   Patient Left in bed;with call bell/phone within reach  Nurse Communication          Time: 4862-8241 OT Time Calculation (min): 30 min  Charges: OT General Charges $OT Visit: 1 Procedure OT Treatments $Therapeutic Exercise: 23-37 mins  Jules Schick [phone] 01/23/2014, 11:07 AM

## 2014-01-23 NOTE — Progress Notes (Signed)
PROGRESS NOTE   Debbie Larsen ZCH:[id number] DOB: 1951-04-06 DOA: 01/13/2014 PCP: Melrose Nakayama, MD  Brief narrative: Debbie Larsen is an 63 y.o. female with past medical history of right breast adenocarcinoma diagnoses in 04/2001 s/p chemo and RT to right breast, high-grade metastatic carcinoma, unknown primary, bone and brain metastasis status post systemic chemo, was treated as breast cancer with Abraxane until 01/2013. Repeat PET scan on 03/13/2013 showed evidence for disease progression. Pt was admitted 01/13/2014 for progressive weakness, poor oral intake, weight loss. She has been started on Decadron on the admission but continues to have no strength in lower extremities. Her MRI lumbar and thoracic spine showed extensive metastatic disease with cord edema and per radiation oncolgy palliative radiotherapy will  not provide much improvement.    Assessment/Plan: Principal Problem:  Progressive generalized weakness, lower extremity weakness/ paralysis/ failure to thrive  Patient has high-grade metastatic carcinoma. She has brain and spinal cord metastasis which explains her lower extremity weakness. MRI of thoracic and lumbar spine confirms extensive metastatic lesions in the spine and cord edema. Her prognosis is extremely poor. Seen by Dr. Valere Dross and the patient is not a candidate for further radiation treatment. She was taking decadron inconsistently prior to admission secondary to side effects including insomnia. Her oncologist has repeatedly recommended hospice care, but is now refusing discharge to a residential hospice facility even though she does not have any local family support and cannot care for herself at home given her lower extremity paralysis. She continues to request unrealistic treatment options such as bone marrow transplantation and other inappropriate therapies including immunotherapies. Active Problems:  Paranoia Unclear if this is from her brain metastasis versus an  underlying psychiatric disorder. She is frankly paranoid and accusing her various treating physicians of withholding treatment from her Metastatic carcinoma of undetermined primary with metastases to bones and brain  CT chest showed progression of metastatic disease within the mediastinum and lung parenchyma, increased size of the lung nodules and number of nodules, interval increase in liver metastatic disease, soft tissue mass within the pelvis on the left which could possibly reflect metastatic disease or possibly primary ovarian origin; per oncology primary possibly breast or lung. Unfortunately, there are no viable treatment options left for her. The palliative care team has been following her. Bone pain  Related to bone metastases. Per oncology recommendations patient was given one dose of Zometa on 01/16/2014. Continue dexamethasone, which has been increased by the palliative care team.  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  Hypothyroidism  Continue synthroid 75 mcg daily.  Anemia / Thrombocytopenia  Secondary to chronic disease due to malignancy and sequela of chemotherapy. Hemoglobin was 7.9 on 3/22 and she has received 1 unit of PRBC transfusion.  Elevated liver enzymes  Likely due to metastatic disease.  Severe protein calorie malnutrition  Secondary to progressive malignancy. As mentioned above, we have encouraged by mouth intake. Patient refused to take feeding supplements.   Code Status: DNR/DNI  Family Communication: No family currently at the bedside. Disposition Plan: Possible residential hospice placement versus SNF.  Consultants:  Oncology (Dr. Julien Nordmann)  Rad oncology  Palliative care  Psychiatry  Procedures:  None   Antibiotics:  None    HPI/Subjective: Debbie Larsen is in relatively good spirits today, although she continues to express distrust of her treating physicians and continues to assert that she wants further therapy for her cancer.  She tells me her appetite is good and that her  pain is minimal. She feels like she is getting stronger. No nausea or vomiting.  Objective: Filed Vitals:   01/22/14 1500 01/22/14 2010 01/22/14 2330 01/23/14 0715  BP: 110/60  122/60 120/65  Pulse: 88 88 91 87  Temp: 99 F (37.2 C)  98.5 F (36.9 C) 98.7 F (37.1 C)  TempSrc: Oral  Oral Oral  Resp: 18  20 20   Height:      Weight:      SpO2: 100%  98% 100%    Intake/Output Summary (Last 24 hours) at 01/23/14 1428 Last data filed at 01/23/14 1111  Gross per 24 hour  Intake 2924.17 ml  Output   2850 ml  Net  74.17 ml    Exam: Gen:  NAD Cardiovascular:  RRR, No M/R/G Respiratory:  Lungs CTAB Gastrointestinal:  Abdomen soft, NT/ND, + BS Extremities:  No C/E/C  Data Reviewed: Basic Metabolic Panel:  Recent Labs Lab 01/19/14 0540 01/21/14 0616  NA 134* 126*  K 4.1 4.3  CL 101 90*  CO2 22 21  GLUCOSE 139* 174*  BUN 6 13  CREATININE 0.53 0.51  CALCIUM 8.2* 8.6   GFR Estimated Creatinine Clearance: 52.4 ml/min (by C-G formula based on Cr of 0.51).  CBC:  Recent Labs Lab 01/19/14 0540 01/21/14 0616  WBC 4.5 8.8  HGB 7.9* 10.5*  HCT 23.4* 30.3*  MCV 80.7 79.3  PLT 99* 132*   Microbiology No results found for this or any previous visit (from the past 240 hour(s)).   Procedures and Diagnostic Studies: Ct Chest W Contrast  01/14/2014   CLINICAL DATA:  restaging stage IV cancerRestaging stage IV cancer  EXAM: CT CHEST, ABDOMEN AND PELVIS WITHOUT CONTRAST  TECHNIQUE: Multidetector CT imaging of the chest, abdomen and pelvis was performed following the standard protocol without IV contrast.  COMPARISON:  None.  MR CHEST MEDIAS WO/W CM dated 12/18/2013; NM PET IMAGE RESTAG (PS) SKULL BASE TO THIGH dated 03/13/2013; NM PET IMAGE RESTAG (PS) SKULL BASE TO THIGH dated 10/08/2013  FINDINGS: CT CHEST FINDINGS  The thoracic inlet is unremarkable.  A 6.1 mm lymph node is appreciated within the prevascular space. An 8.5 mm  subcarinal lymph node is identified. A 10 mm left hilar lymph node is appreciated image 22 series 2.  Postsurgical changes identified within the right axilla in the region of the previously described index nodule. There is mild increased soft tissue attenuation within the surgical bed which may represent postsurgical change. Findings are again consistent with a lumpectomy within the lateral aspect of the right breast. A left-sided port a catheter is identified with tip projecting in the region of the superior vena caval right atrial junction.  The an 11 mm lingular nodule is again identified. There is slight increased peripheral spiculation below though the size is unchanged. An ill-defined area of ground-glass density projects in the anterior base of the lingula image 23 series for which is increased and size and conspicuity measuring 8.8 mm. A small adjacent satellite nodule projects posteriorly measuring 4 mm. The 5 mm nodule within the periphery of the lingula image 20 is slightly decreased in conspicuity measuring 4 mm. The spiculated nodule within the left lower lobe image 34 series 4 is unchanged measuring 12 x 9 mm.  Stable subpleural interstitial changes identified within the anterior aspect of the right upper lobe. A small 4.2 mm nodule was developed within the anterior aspect of the right upper lobe image 16 series 4. A 7.3 mm spiculated nodule projects anteriorly  within the right middle lobe image 22 series 4. A 5.5 mm nodule is also identified posteriorly within the right middle lobe seen image. The pulmonary nodule in the anterior aspect of the right middle lobe has increased in size and extends to the subpleural region with greatest confluence measuring 17 x 9 mm image 32. When compared to the previous study there has been interval development of smaller less than 5 mm size pulmonary nodules within the base of the right middle lobe. The 7 mm pulmonary nodule posteriorly within the right lower lobe  image 35 series 4 has increased in size now measuring 8 mm. A 9 mm spiculated nodules appreciated centrally within the right upper lobe image 13 series 4.  No focal regions of consolidation are identified nor evidence of focal infiltrates. There is prominence of the interstitial markings within the right and left hemithoraces.  CT ABDOMEN AND PELVIS FINDINGS  Evaluation of the liver demonstrates low attenuating ill-defined nodules within the anterior dome of the right lobe of the liver as well as adjacent to the the junction of the right and left lobes of the liver image 45 series 2. A dominant nodules appreciated within the anterior central portion of the right lobe of the liver demonstrating heterogeneous enhancement and measuring 19 x 16 mm in AP by transverse dimensions, increased in size when correlated with dated 10/08/2013.  The spleen, adrenals, pancreas, kidneys are unremarkable.  There is no evidence of bowel obstruction, enteritis, colitis, diverticulitis, nor appendicitis.  There is no evidence of abdominal aortic aneurysm. The celiac, SMA, IMA, portal vein are opacified.  There is no evidence of abdominal free fluid, loculated fluid collections, nor masses. A 9 mm lymph node is appreciated along the gastrohepatic ligament. A 11 mm celiac plexus lymph node is identified. Image 47 series 2.  Evaluation of the pelvis demonstrates an oval shaped area of soft tissue attenuation in the posterior left adnexa measuring 3.8 x 1.9 cm in AP by transverse dimensions demonstrating Hounsfield units of 41.7. No further pelvic masses, free fluid, loculated fluid collections or adenopathy is appreciated.  Evaluation osseous structures demonstrates diffuse sclerotic density throughout the appendicular and axial skeleton. An expansile lesion is identified within the inferior pubic ramus on the right which may reflect sequela prior fracture likely pathologic or an expansile focus of metastatic disease.  IMPRESSION: 1.  Progression of metastatic disease within the mediastinum and lung parenchyma. There been increased size of the lung nodules and increased numbers. Interstitial findings within the lungs which may reflect the sequela of lymph and take infiltration. Chronic interstitial fibrotic change is of diagnostic consideration. 2. Interval increase  burden of metastatic disease within the liver. 3. Diffuse metastatic disease throughout the appendicular and axial skeleton 4. Findings concerning for a soft tissue mass within the pelvis on the left which may reflect a metastatic disease or possibly an ovarian origin. 5. Re evaluation with total body PET-CT is recommended.   Electronically Signed   By: Margaree Mackintosh M.D.   On: 01/14/2014 11:40   Mr Jeri Cos XB Contrast  01/20/2014   CLINICAL DATA:  Breast cancer.  Evaluate for metastatic disease.  EXAM: MRI HEAD WITHOUT AND WITH CONTRAST  TECHNIQUE: Multiplanar, multiecho pulse sequences of the brain and surrounding structures were obtained without and with intravenous contrast.  CONTRAST:  MultiHance 9 mL.  COMPARISON:  MR HEAD WO/W CM dated 12/18/2013; MR C SPINE WO/W CM dated 02/21/2013  FINDINGS: No acute stroke or hemorrhage. No hydrocephalus or  extra-axial fluid.  Intracranial metastatic disease is re-demonstrated, as was observed on the previous MR. Subcentimeter lesions are seen in the right cerebellar hemisphere, inferior vermis, right temporal lobe, left temporal lobe, and multiple lesions throughout the subcortical white matter of left greater than right cerebral hemispheres. The dominant lesion is an osseous metastasis to the greater wing of the sphenoid on the right, measures 17 x 19 mm, unchanged in size from priors. A dural based lesion is also seen over the right frontal convexity, similar to priors. No midline shift. No impending herniation. Moderate vasogenic edema is associated with the right temporal lesion, increased from priors, and could predispose the  patient to seizures. Other lesions also are roughly similar, including a spherical 7 mm lesion deep to the insula. There is slight increased edema surrounding the right cerebellar lesion compared with priors, but there is slightly less edema surrounding the left parietal subcortical lesion. A few more lesions are visible than were previously noted on the 12/18/2013 study, but there was considerable motion on that exam.  Marrow heterogeneity in the clivus could suggest additional tumor involvement. Low T1 signal intensity bone marrow in the upper cervical region unchanged from priors.  IMPRESSION: Suspected slight progression of metastatic disease, with a few more lesions visible than were observed on 12/18/2013, as well as increased vasogenic edema surrounding the right middle cranial fossa osseous lesion, and right cerebellar lesion. No impending herniation or midline shift.  No lesions involving the parasagittal posterior frontal cortex which might cause bilateral leg weakness.   Electronically Signed   By: Rolla Flatten M.D.   On: 01/20/2014 20:09   Mr Thoracic Spine W Contrast  01/21/2014   CLINICAL DATA:  63 year old female with stage IV breast cancer. New onset lower extremity weakness and unable to move the lower extremities. Initial encounter.  EXAM: MRI THORACIC SPINE WITH CONTRAST  TECHNIQUE: Multiplanar and multiecho pulse sequences of the thoracic spine were obtained with intravenous contrast.  CONTRAST:  71mL MULTIHANCE GADOBENATE DIMEGLUMINE 529 MG/ML IV SOLN  COMPARISON:  Lumbar MRI 01/20/2014.  FINDINGS: Diffusely abnormal bone marrow signal. Subsequently, cervical vertebral delineation is difficult on the sagittal scout view. The numbering system here agrees with that on the recent lumbar exam.  Diffuse thoracic spine and widespread posterior rib metastases. Still, no thoracic epidural or extraosseous tumor identified.  Intra-axial partially exophytic solidly enhancing mass of the thoracic spinal  cord centered at the T8 level measures 7 x 9 x 17 mm (AP by transverse by CC) and is associated with widespread thoracic spinal cord edema. Only a portion of the conus medullaris, and the ventral lower cervical cord is spared. Mild cord expansion suspected below the level of the tumor.  There is also a much smaller (2 mm) enhancing cord metastasis at the T2-T3 level just to the right of midline (series 10, image 8).  No definite cauda equina tumor. No abnormal dural thickening or enhancement.  Otherwise, bilateral lung nodules are suggested, and central right liver mass may have increased (series 7, image 39). See Chest abdomen pelvis CT 01/14/2014.  IMPRESSION: 1. 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 Associated spinal cord edema is widespread. 2. Diffuse osseous metastatic disease. Study discussed by telephone with Dr. [name] on 01/21/2014 at 15:29 .   Electronically Signed   By: Lars Pinks M.D.   On: 01/21/2014 15:31   Mr Lumbar Spine W Wo Contrast  01/20/2014   CLINICAL DATA:  New  onset of lower extremity weakness. Breast cancer.  EXAM: MRI LUMBAR SPINE WITHOUT AND WITH CONTRAST  TECHNIQUE: Multiplanar and multiecho pulse sequences of the lumbar spine were obtained without and with intravenous contrast.  CONTRAST:  66mL MULTIHANCE GADOBENATE DIMEGLUMINE 529 MG/ML IV SOLN  COMPARISON:  CT ABD/PELVIS W CM dated 01/14/2014; MR HEAD WO/W CM dated 01/20/2014; NM PET IMAGE RESTAG (PS) SKULL BASE TO THIGH dated 10/08/2013  FINDINGS: The scan extends from midportion T11 through the sacrum.  Widespread marrow heterogeneity reflecting osseous metastatic disease and post treatment effect. No areas of pathologic compression deformity are evident. There is no visible epidural tumor. The alignment is anatomic.  No pelvic masses.  Bladder decompressed with Foley catheter.  No disc protrusion or spinal stenosis. Post infusion imaging demonstrates no abnormal intraspinal enhancement  of the nerve roots or distal conus.  On sagittal T2 and STIR imaging, the conus appears abnormal, displaying intramedullary T2 bright signal and slight cord enlargement. An intramedullary metastasis above the field-of-view, at T10 or above, is suspected, with associated distal cord edema. There is no definite abnormal enhancement of the visualized thoracic cord or conus on post infusion imaging.  IMPRESSION: Suspected intramedullary metastasis above the field-of-view of lumbar spine MRI. The distal thoracic cord and conus appear to display T2 bright signal concerning for edema. Thoracic spine MRI without and with contrast recommended for further evaluation.  No evidence for spinal stenosis, dropped metastases to the cauda equina, or intraspinal mass lesion in the lumbar or sacral segments.  Widespread osseous disease without pathologic compression fracture or malalignment.   Electronically Signed   By: Rolla Flatten M.D.   On: 01/20/2014 19:19   Ct Abdomen Pelvis W Contrast  01/14/2014   CLINICAL DATA:  restaging stage IV cancerRestaging stage IV cancer  EXAM: CT CHEST, ABDOMEN AND PELVIS WITHOUT CONTRAST  TECHNIQUE: Multidetector CT imaging of the chest, abdomen and pelvis was performed following the standard protocol without IV contrast.  COMPARISON:  None.  MR CHEST MEDIAS WO/W CM dated 12/18/2013; NM PET IMAGE RESTAG (PS) SKULL BASE TO THIGH dated 03/13/2013; NM PET IMAGE RESTAG (PS) SKULL BASE TO THIGH dated 10/08/2013  FINDINGS: CT CHEST FINDINGS  The thoracic inlet is unremarkable.  A 6.1 mm lymph node is appreciated within the prevascular space. An 8.5 mm subcarinal lymph node is identified. A 10 mm left hilar lymph node is appreciated image 22 series 2.  Postsurgical changes identified within the right axilla in the region of the previously described index nodule. There is mild increased soft tissue attenuation within the surgical bed which may represent postsurgical change. Findings are again consistent  with a lumpectomy within the lateral aspect of the right breast. A left-sided port a catheter is identified with tip projecting in the region of the superior vena caval right atrial junction.  The an 11 mm lingular nodule is again identified. There is slight increased peripheral spiculation below though the size is unchanged. An ill-defined area of ground-glass density projects in the anterior base of the lingula image 23 series for which is increased and size and conspicuity measuring 8.8 mm. A small adjacent satellite nodule projects posteriorly measuring 4 mm. The 5 mm nodule within the periphery of the lingula image 20 is slightly decreased in conspicuity measuring 4 mm. The spiculated nodule within the left lower lobe image 34 series 4 is unchanged measuring 12 x 9 mm.  Stable subpleural interstitial changes identified within the anterior aspect of the right upper lobe. A small 4.2  mm nodule was developed within the anterior aspect of the right upper lobe image 16 series 4. A 7.3 mm spiculated nodule projects anteriorly within the right middle lobe image 22 series 4. A 5.5 mm nodule is also identified posteriorly within the right middle lobe seen image. The pulmonary nodule in the anterior aspect of the right middle lobe has increased in size and extends to the subpleural region with greatest confluence measuring 17 x 9 mm image 32. When compared to the previous study there has been interval development of smaller less than 5 mm size pulmonary nodules within the base of the right middle lobe. The 7 mm pulmonary nodule posteriorly within the right lower lobe image 35 series 4 has increased in size now measuring 8 mm. A 9 mm spiculated nodules appreciated centrally within the right upper lobe image 13 series 4.  No focal regions of consolidation are identified nor evidence of focal infiltrates. There is prominence of the interstitial markings within the right and left hemithoraces.  CT ABDOMEN AND PELVIS FINDINGS   Evaluation of the liver demonstrates low attenuating ill-defined nodules within the anterior dome of the right lobe of the liver as well as adjacent to the the junction of the right and left lobes of the liver image 45 series 2. A dominant nodules appreciated within the anterior central portion of the right lobe of the liver demonstrating heterogeneous enhancement and measuring 19 x 16 mm in AP by transverse dimensions, increased in size when correlated with dated 10/08/2013.  The spleen, adrenals, pancreas, kidneys are unremarkable.  There is no evidence of bowel obstruction, enteritis, colitis, diverticulitis, nor appendicitis.  There is no evidence of abdominal aortic aneurysm. The celiac, SMA, IMA, portal vein are opacified.  There is no evidence of abdominal free fluid, loculated fluid collections, nor masses. A 9 mm lymph node is appreciated along the gastrohepatic ligament. A 11 mm celiac plexus lymph node is identified. Image 47 series 2.  Evaluation of the pelvis demonstrates an oval shaped area of soft tissue attenuation in the posterior left adnexa measuring 3.8 x 1.9 cm in AP by transverse dimensions demonstrating Hounsfield units of 41.7. No further pelvic masses, free fluid, loculated fluid collections or adenopathy is appreciated.  Evaluation osseous structures demonstrates diffuse sclerotic density throughout the appendicular and axial skeleton. An expansile lesion is identified within the inferior pubic ramus on the right which may reflect sequela prior fracture likely pathologic or an expansile focus of metastatic disease.  IMPRESSION: 1. Progression of metastatic disease within the mediastinum and lung parenchyma. There been increased size of the lung nodules and increased numbers. Interstitial findings within the lungs which may reflect the sequela of lymph and take infiltration. Chronic interstitial fibrotic change is of diagnostic consideration. 2. Interval increase  burden of metastatic  disease within the liver. 3. Diffuse metastatic disease throughout the appendicular and axial skeleton 4. Findings concerning for a soft tissue mass within the pelvis on the left which may reflect a metastatic disease or possibly an ovarian origin. 5. Re evaluation with total body PET-CT is recommended.   Electronically Signed   By: Margaree Mackintosh M.D.   On: 01/14/2014 11:40    Scheduled Meds: . B-complex with vitamin C  1 tablet Oral Daily  . dexamethasone  4 mg Intravenous Q12H  . diazepam  2.5 mg Intravenous QHS,MR X 1  . feeding supplement (RESOURCE BREEZE)  1 Container Oral TID BM  . gabapentin  100 mg Oral BID  . levothyroxine  75 mcg Oral QAC breakfast  . multivitamin with minerals  1 tablet Oral Daily   Continuous Infusions: . sodium chloride 1,000 mL (01/23/14 0549)    Time spent: 25 minutes.    LOS: 10 days   [facility] Pager [phone]. If unable to reach me by pager, please call my cell phone at [phone].  *Please note that the hospitalists switch teams on Wednesdays. Please call the flow manager at [phone] if you are having difficulty reaching the hospitalist taking care of this patient as she can update you and provide the most up-to-date pager number of provider caring for the patient. If 8PM-8AM, please contact night-coverage at [email or web address], password [facility]  01/23/2014, 2:28 PM    **Disclaimer: This note was dictated with voice recognition software. Similar sounding words can inadvertently be transcribed and this note may contain transcription errors which may not have been corrected upon publication of note.**

## 2014-01-23 NOTE — Care Management Note (Signed)
Patient discussed in LOS meeting.Pt has bed available at [facility], currently refusing. Pt pending Psych consult to assess competency. Will continue to follow for possible transfer to another acute care hospital vs home  If pt refusing to transition into residential hospice or SNF.    Venita Lick Humphrey Guerreiro,MSN,RN [phone]

## 2014-01-23 NOTE — Consult Note (Signed)
[place] Liaison: Spoke with patient by phone yesterday afternoon to make her aware [facility] room is available for her today 01/23/2014 if she is interested in transfer. She declined offer for the moment stating she still needed to talk to her family and MD. She reports her family is coming in over the weekend. She reports being hopeful there are more treatment options which would give her more time. She is aware Dr. Jana Hakim has offered to manage her care at [facility]. She is aware I will follow up with CSW this am and continue to follow until disposition is determined. She is knowledgeable of [place] of care and has visited other people at [facility]. Note Palliative Medicine Team to meet with patient. Thank you. Erling Conte LCSW [phone]

## 2014-01-23 NOTE — Progress Notes (Signed)
Debbie Larsen   DOB:April 03, 1951   YQ#:[id number]   UGQ#:[id number]  Subjective: "I need immunotherapy. I can participate in a clinical trial. They have treatments at [facility] or other places I can get."   Objective: middle aged [place] woman examined in bed Filed Vitals:   01/22/14 2330  BP: 122/60  Pulse: 91  Temp: 98.5 F (36.9 C)  Resp: 20    Body mass index is 20.57 kg/(m^2).  Intake/Output Summary (Last 24 hours) at 01/23/14 0732 Last data filed at 01/22/14 1859  Gross per 24 hour  Intake   1175 ml  Output   2250 ml  Net  -1075 ml     Exam unchanged from prior    CBG (last 3)  No results found for this basename: GLUCAP,  in the last 72 hours   Labs:  Lab Results  Component Value Date   WBC 8.8 01/21/2014   HGB 10.5* 01/21/2014   HCT 30.3* 01/21/2014   MCV 79.3 01/21/2014   PLT 132* 01/21/2014   NEUTROABS 4.0 01/13/2014    @LASTCHEMISTRY @  Urine Studies No results found for this basename: UACOL, UAPR, USPG, UPH, UTP, UGL, UKET, UBIL, UHGB, UNIT, UROB, ULEU, UEPI, UWBC, URBC, UBAC, CAST, CRYS, UCOM, BILUA,  in the last 72 hours  Basic Metabolic Panel:  Recent Labs Lab 01/19/14 0540 01/21/14 0616  NA 134* 126*  K 4.1 4.3  CL 101 90*  CO2 22 21  GLUCOSE 139* 174*  BUN 6 13  CREATININE 0.53 0.51  CALCIUM 8.2* 8.6   GFR Estimated Creatinine Clearance: 52.4 ml/min (by C-G formula based on Cr of 0.51). Liver Function Tests: No results found for this basename: AST, ALT, ALKPHOS, BILITOT, PROT, ALBUMIN,  in the last 168 hours No results found for this basename: LIPASE, AMYLASE,  in the last 168 hours No results found for this basename: AMMONIA,  in the last 168 hours Coagulation profile No results found for this basename: INR, PROTIME,  in the last 168 hours  CBC:  Recent Labs Lab 01/19/14 0540 01/21/14 0616  WBC 4.5 8.8  HGB 7.9* 10.5*  HCT 23.4* 30.3*  MCV 80.7 79.3  PLT 99* 132*   Cardiac Enzymes: No results found for this basename: CKTOTAL,  CKMB, CKMBINDEX, TROPONINI,  in the last 168 hours BNP: No components found with this basename: POCBNP,  CBG: No results found for this basename: GLUCAP,  in the last 168 hours D-Dimer No results found for this basename: DDIMER,  in the last 72 hours Hgb A1c No results found for this basename: HGBA1C,  in the last 72 hours Lipid Profile No results found for this basename: CHOL, HDL, LDLCALC, TRIG, CHOLHDL, LDLDIRECT,  in the last 72 hours Thyroid function studies No results found for this basename: TSH, T4TOTAL, FREET3, T3FREE, THYROIDAB,  in the last 72 hours Anemia work up No results found for this basename: VITAMINB12, FOLATE, FERRITIN, TIBC, IRON, RETICCTPCT,  in the last 72 hours Microbiology No results found for this or any previous visit (from the past 240 hour(s)).    Studies:  Mr Thoracic Spine W Contrast  01/21/2014   CLINICAL DATA:  63 year old female with stage IV breast cancer. New onset lower extremity weakness and unable to move the lower extremities. Initial encounter.  EXAM: MRI THORACIC SPINE WITH CONTRAST  TECHNIQUE: Multiplanar and multiecho pulse sequences of the thoracic spine were obtained with intravenous contrast.  CONTRAST:  66m MULTIHANCE GADOBENATE DIMEGLUMINE 529 MG/ML IV SOLN  COMPARISON:  Lumbar MRI  01/20/2014.  FINDINGS: Diffusely abnormal bone marrow signal. Subsequently, cervical vertebral delineation is difficult on the sagittal scout view. The numbering system here agrees with that on the recent lumbar exam.  Diffuse thoracic spine and widespread posterior rib metastases. Still, no thoracic epidural or extraosseous tumor identified.  Intra-axial partially exophytic solidly enhancing mass of the thoracic spinal cord centered at the T8 level measures 7 x 9 x 17 mm (AP by transverse by CC) and is associated with widespread thoracic spinal cord edema. Only a portion of the conus medullaris, and the ventral lower cervical cord is spared. Mild cord expansion  suspected below the level of the tumor.  There is also a much smaller (2 mm) enhancing cord metastasis at the T2-T3 level just to the right of midline (series 10, image 8).  No definite cauda equina tumor. No abnormal dural thickening or enhancement.  Otherwise, bilateral lung nodules are suggested, and central right liver mass may have increased (series 7, image 39). See Chest abdomen pelvis CT 01/14/2014.  IMPRESSION: 1. 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 Associated spinal cord edema is widespread. 2. Diffuse osseous metastatic disease. Study discussed by telephone with Dr. [name] on 01/21/2014 at 15:29 .   Electronically Signed   By: Lars Pinks M.D.   On: 01/21/2014 15:31    Assessment: 63 y.o. [place], [place] woman originally from [place] with stage IV breast cancer:  (1) Status post right lumpectomy and sentinel lymph node sampling 05/16/2001 for a pT1c pN1, stage IIA invasive ductal carcinoma, grade 2, estrogen receptor 95% and progesterone receptor 96% positive, with an MIB-1 of 11% and no HER-2 amplification by immunocytochemistry.  (2) Treated adjuvantly with cyclophosphamide, methotrexate and fluorouracil, likely for 8 cycles (data unavailable), completed February 2003.  (3) Completed radiation to the right breast and regional lymph nodes 02/12/2002.  (4) The patient apparently refused axillary lymph node dissection and antiestrogen therapy (data unavailable).  (5) Recurrent disease documented by right axillary lymph node biopsy 08/23/2012, showing a high-grade invasive ductal carcinoma which was estrogen and progesterone receptor negative, with an MIB-1 of 59%, and no HER-2 amplification; staging studies showed hyper metabolic mediastinal and left lung lesions, with no involvement of the liver or brain  (6) Abraxane every 2 weeks started 09/20/2012, with good tolerance and evidence of response, completing 12 doses 02/21/2013. Resumed  07/16/2013.  (7) Restaging studies at [facility] March 2014 and here April-May 2014 as follows:  (a) bone involvement: [facility] bone scan showed focal tracer activity in the left acetabulum, left 12th rib posteriorly, and right sixth rib posteriorly; on CT scan a 9 mm sclerotic sacral lesion was new as compared to November of 2013.  (b) [facility] CT of the chest, abdomen and pelvis showed slight decrease in size of some mediastinal lymph nodes, persistent unchanged nodularity in the right axilla, normal liver, and no new or enlarging pulmonary nodules.  (c) MRI of the cervical spine shows no obvious lesion to explain the patient's Right arm symptoms.  (d) PET scan shows multiple bone lesions (including C7), mild growth in index lymph nodes, no change in possible left lung nodules, and no liver involvement.  (7) Genetic testing obtained at [facility] 03/19/2013, results pending.  (8) Zolendronate started 02/21/2013, continuing monthly.  (9) Nonspecific myositis RUE documented at [facility] by [facility] March 2014; cervical MRI does not explain symptoms, which are likely due to a right axillary radiculopathy.  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 I have urged her to accept a bed at [facility].   She "wants to explore further treatment options." I told her as nicely but as clearly as I could that there are not more treatment options that would lengthen her life--that treatments would only add toxicity to her last weeks or days. With brain mets and < 6 months anticipated survival she is not going to be a candidate for  phase I trials. I have discussed this with Dr Julien Nordmann and Dr Valere Dross and they are of the same mind. I told Debbie Larsen I thought she had a brief time to live. I praised her desire to live and her willingness to [facility] unproven treatments but cautioned her this may get in the way of her making appropriate plans for he last weeks or days. I urged her to pretend she is going to die in a few days so she can clarify what goals she wants/ needs to achieve before then. (I am particularly concerned that she has kept her son in the dark for a long time--I am not sure he knows her current situation).  I will follow with you. Appreciate your care to this challenging patient!   Chauncey Cruel, MD 01/23/2014  7:32 AM

## 2014-01-24 ENCOUNTER — Other Ambulatory Visit: Payer: Self-pay | Admitting: Oncology

## 2014-01-24 DIAGNOSIS — C50919 Malignant neoplasm of unspecified site of unspecified female breast: Secondary | ICD-10-CM

## 2014-01-24 DIAGNOSIS — F064 Anxiety disorder due to known physiological condition: Secondary | ICD-10-CM

## 2014-01-24 DIAGNOSIS — G8220 Paraplegia, unspecified: Secondary | ICD-10-CM | POA: Diagnosis present

## 2014-01-24 LAB — CBC
HEMATOCRIT: 29.6 % — AB (ref 36.0–46.0)
HEMOGLOBIN: 10 g/dL — AB (ref 12.0–15.0)
MCH: 27 pg (ref 26.0–34.0)
MCHC: 33.8 g/dL (ref 30.0–36.0)
MCV: 80 fL (ref 78.0–100.0)
Platelets: 184 10*3/uL (ref 150–400)
RBC: 3.7 MIL/uL — AB (ref 3.87–5.11)
RDW: 15.3 % (ref 11.5–15.5)
WBC: 7.8 10*3/uL (ref 4.0–10.5)

## 2014-01-24 LAB — COMPREHENSIVE METABOLIC PANEL
ALBUMIN: 2.8 g/dL — AB (ref 3.5–5.2)
ALT: 103 U/L — ABNORMAL HIGH (ref 0–35)
AST: 66 U/L — AB (ref 0–37)
Alkaline Phosphatase: 221 U/L — ABNORMAL HIGH (ref 39–117)
BILIRUBIN TOTAL: 0.3 mg/dL (ref 0.3–1.2)
BUN: 16 mg/dL (ref 6–23)
CALCIUM: 8.9 mg/dL (ref 8.4–10.5)
CHLORIDE: 98 meq/L (ref 96–112)
CO2: 22 mEq/L (ref 19–32)
CREATININE: 0.49 mg/dL — AB (ref 0.50–1.10)
GFR calc Af Amer: >90 mL/min (ref >90)
GFR calc non Af Amer: >90 mL/min (ref >90)
Glucose, Bld: 136 mg/dL — ABNORMAL HIGH (ref 70–99)
Potassium: 4.3 mEq/L (ref 3.7–5.3)
Sodium: 133 mEq/L — ABNORMAL LOW (ref 137–147)
TOTAL PROTEIN: 6.4 g/dL (ref 6.0–8.3)

## 2014-01-24 NOTE — Progress Notes (Signed)
PROGRESS NOTE   DARCEE DEKKER URK:[id number] DOB: 02-Sep-1951 DOA: 01/13/2014 PCP: Melrose Nakayama, MD  Brief narrative: Debbie Larsen is an 63 y.o. female with past medical history of right breast adenocarcinoma diagnoses in 04/2001 s/p chemo and RT to right breast, high-grade metastatic carcinoma, unknown primary, bone and brain metastasis status post systemic chemo, was treated as breast cancer with Abraxane until 01/2013. Repeat PET scan on 03/13/2013 showed evidence for disease progression. Pt was admitted 01/13/2014 for progressive weakness, poor oral intake, weight loss. She has been started on Decadron on the admission but continues to have no strength in lower extremities. Her MRI lumbar and thoracic spine showed extensive metastatic disease with cord edema and per radiation oncolgy palliative radiotherapy will  not provide much improvement.   Assessment/Plan: Principal Problem:  Progressive generalized weakness, lower extremity weakness/ paralysis/ failure to thrive  Patient has high-grade metastatic carcinoma. 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   Her prognosis is extremely poor. No role for further radiation treatment per Dr. Valere Dross.  Her oncologist has repeatedly recommended hospice care, but is now refusing discharge to a residential hospice facility even though she does not have any local family support and cannot care for herself at home given her lower extremity paralysis. She continues to request unrealistic treatment options such as bone marrow transplantation and other inappropriate therapies including immunotherapies.  Continue decadron.  Palliative care family meeting scheduled for 01/25/14. Active Problems:  Paranoia Unclear if this is from her brain metastasis versus an underlying psychiatric disorder. She is frankly paranoid and accusing her various treating physicians of withholding treatment from her.  Evaluated by psychiatrist and found to have  capacity. Metastatic carcinoma of undetermined primary with metastases to bones and brain  CT chest showed progression of metastatic disease within the mediastinum and lung parenchyma, increased size of the lung nodules and number of nodules, interval increase in liver metastatic disease, soft tissue mass within the pelvis on the left which could possibly reflect metastatic disease or possibly primary ovarian origin; per oncology primary possibly breast or lung. Unfortunately, there are no viable treatment options left for her. The palliative care team has been following her. Bone pain  Related to bone metastases. Per oncology recommendations patient was given one dose of Zometa on 01/16/2014. Continue dexamethasone, which has been increased by the palliative care team.  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  Hypothyroidism  Continue synthroid 75 mcg daily.  Anemia / Thrombocytopenia  Secondary to chronic disease due to malignancy and sequela of chemotherapy. Hemoglobin was 7.9 on 3/22 and she has received 1 unit of PRBC transfusion.  Elevated liver enzymes  Likely due to metastatic disease.  Severe protein calorie malnutrition  Secondary to progressive malignancy. As mentioned above, we have encouraged by mouth intake. Patient refused to take feeding supplements.   Code Status: DNR/DNI  Family Communication: No family currently at the bedside. Disposition Plan: Possible residential hospice placement versus SNF.  Consultants:  Oncology (Dr. Julien Nordmann)  Rad oncology  Palliative care  Psychiatry  Procedures:  None   Antibiotics:  None    HPI/Subjective: VARONICA Larsen continues to vocalize distrust about her treating physicians and denial about her prognosis and lack of treatment options.  She asserts she is "getting stronger" but acknowledges that she still has no ability to move her legs.  She wants to stay in the hospital "for a few more days" so she can get  stronger.  Objective: Filed Vitals:   01/23/14 1546 01/23/14 2226 01/23/14 2240 01/24/14 0614  BP: 120/62 110/68  112/72  Pulse: 82 77 90 75  Temp: 98.6 F (37 C)   98.8 F (37.1 C)  TempSrc: Oral Oral  Oral  Resp: 20 18  20   Height:      Weight:      SpO2: 100% 100%  100%    Intake/Output Summary (Last 24 hours) at 01/24/14 1300 Last data filed at 01/24/14 0540  Gross per 24 hour  Intake 2158.33 ml  Output   3150 ml  Net -991.67 ml    Exam: Gen:  NAD Cardiovascular:  RRR, No M/R/G Respiratory:  Lungs CTAB Gastrointestinal:  Abdomen soft, NT/ND, + BS Extremities:  No C/E/C  Data Reviewed: Basic Metabolic Panel:  Recent Labs Lab 01/19/14 0540 01/21/14 0616 01/24/14 0546  NA 134* 126* 133*  K 4.1 4.3 4.3  CL 101 90* 98  CO2 22 21 22   GLUCOSE 139* 174* 136*  BUN 6 13 16   CREATININE 0.53 0.51 0.49*  CALCIUM 8.2* 8.6 8.9   GFR Estimated Creatinine Clearance: 52.4 ml/min (by C-G formula based on Cr of 0.49).  CBC:  Recent Labs Lab 01/19/14 0540 01/21/14 0616 01/24/14 0546  WBC 4.5 8.8 7.8  HGB 7.9* 10.5* 10.0*  HCT 23.4* 30.3* 29.6*  MCV 80.7 79.3 80.0  PLT 99* 132* 184   Microbiology No results found for this or any previous visit (from the past 240 hour(s)).   Procedures and Diagnostic Studies: Ct Chest W Contrast  01/14/2014   CLINICAL DATA:  restaging stage IV cancerRestaging stage IV cancer  EXAM: CT CHEST, ABDOMEN AND PELVIS WITHOUT CONTRAST  TECHNIQUE: Multidetector CT imaging of the chest, abdomen and pelvis was performed following the standard protocol without IV contrast.  COMPARISON:  None.  MR CHEST MEDIAS WO/W CM dated 12/18/2013; NM PET IMAGE RESTAG (PS) SKULL BASE TO THIGH dated 03/13/2013; NM PET IMAGE RESTAG (PS) SKULL BASE TO THIGH dated 10/08/2013  FINDINGS: CT CHEST FINDINGS  The thoracic inlet is unremarkable.  A 6.1 mm lymph node is appreciated within the prevascular space. An 8.5 mm subcarinal lymph node is identified. A 10 mm left  hilar lymph node is appreciated image 22 series 2.  Postsurgical changes identified within the right axilla in the region of the previously described index nodule. There is mild increased soft tissue attenuation within the surgical bed which may represent postsurgical change. Findings are again consistent with a lumpectomy within the lateral aspect of the right breast. A left-sided port a catheter is identified with tip projecting in the region of the superior vena caval right atrial junction.  The an 11 mm lingular nodule is again identified. There is slight increased peripheral spiculation below though the size is unchanged. An ill-defined area of ground-glass density projects in the anterior base of the lingula image 23 series for which is increased and size and conspicuity measuring 8.8 mm. A small adjacent satellite nodule projects posteriorly measuring 4 mm. The 5 mm nodule within the periphery of the lingula image 20 is slightly decreased in conspicuity measuring 4 mm. The spiculated nodule within the left lower lobe image 34 series 4 is unchanged measuring 12 x 9 mm.  Stable subpleural interstitial changes identified within the anterior aspect of the right upper lobe. A small 4.2 mm nodule was developed within the anterior aspect of the right upper lobe image 16 series 4. A 7.3 mm spiculated nodule projects anteriorly within the  right middle lobe image 22 series 4. A 5.5 mm nodule is also identified posteriorly within the right middle lobe seen image. The pulmonary nodule in the anterior aspect of the right middle lobe has increased in size and extends to the subpleural region with greatest confluence measuring 17 x 9 mm image 32. When compared to the previous study there has been interval development of smaller less than 5 mm size pulmonary nodules within the base of the right middle lobe. The 7 mm pulmonary nodule posteriorly within the right lower lobe image 35 series 4 has increased in size now measuring  8 mm. A 9 mm spiculated nodules appreciated centrally within the right upper lobe image 13 series 4.  No focal regions of consolidation are identified nor evidence of focal infiltrates. There is prominence of the interstitial markings within the right and left hemithoraces.  CT ABDOMEN AND PELVIS FINDINGS  Evaluation of the liver demonstrates low attenuating ill-defined nodules within the anterior dome of the right lobe of the liver as well as adjacent to the the junction of the right and left lobes of the liver image 45 series 2. A dominant nodules appreciated within the anterior central portion of the right lobe of the liver demonstrating heterogeneous enhancement and measuring 19 x 16 mm in AP by transverse dimensions, increased in size when correlated with dated 10/08/2013.  The spleen, adrenals, pancreas, kidneys are unremarkable.  There is no evidence of bowel obstruction, enteritis, colitis, diverticulitis, nor appendicitis.  There is no evidence of abdominal aortic aneurysm. The celiac, SMA, IMA, portal vein are opacified.  There is no evidence of abdominal free fluid, loculated fluid collections, nor masses. A 9 mm lymph node is appreciated along the gastrohepatic ligament. A 11 mm celiac plexus lymph node is identified. Image 47 series 2.  Evaluation of the pelvis demonstrates an oval shaped area of soft tissue attenuation in the posterior left adnexa measuring 3.8 x 1.9 cm in AP by transverse dimensions demonstrating Hounsfield units of 41.7. No further pelvic masses, free fluid, loculated fluid collections or adenopathy is appreciated.  Evaluation osseous structures demonstrates diffuse sclerotic density throughout the appendicular and axial skeleton. An expansile lesion is identified within the inferior pubic ramus on the right which may reflect sequela prior fracture likely pathologic or an expansile focus of metastatic disease.  IMPRESSION: 1. Progression of metastatic disease within the mediastinum  and lung parenchyma. There been increased size of the lung nodules and increased numbers. Interstitial findings within the lungs which may reflect the sequela of lymph and take infiltration. Chronic interstitial fibrotic change is of diagnostic consideration. 2. Interval increase  burden of metastatic disease within the liver. 3. Diffuse metastatic disease throughout the appendicular and axial skeleton 4. Findings concerning for a soft tissue mass within the pelvis on the left which may reflect a metastatic disease or possibly an ovarian origin. 5. Re evaluation with total body PET-CT is recommended.   Electronically Signed   By: Margaree Mackintosh M.D.   On: 01/14/2014 11:40   Mr Jeri Cos RJ Contrast  01/20/2014   CLINICAL DATA:  Breast cancer.  Evaluate for metastatic disease.  EXAM: MRI HEAD WITHOUT AND WITH CONTRAST  TECHNIQUE: Multiplanar, multiecho pulse sequences of the brain and surrounding structures were obtained without and with intravenous contrast.  CONTRAST:  MultiHance 9 mL.  COMPARISON:  MR HEAD WO/W CM dated 12/18/2013; MR C SPINE WO/W CM dated 02/21/2013  FINDINGS: No acute stroke or hemorrhage. No hydrocephalus or extra-axial fluid.  Intracranial metastatic disease is re-demonstrated, as was observed on the previous MR. Subcentimeter lesions are seen in the right cerebellar hemisphere, inferior vermis, right temporal lobe, left temporal lobe, and multiple lesions throughout the subcortical white matter of left greater than right cerebral hemispheres. The dominant lesion is an osseous metastasis to the greater wing of the sphenoid on the right, measures 17 x 19 mm, unchanged in size from priors. A dural based lesion is also seen over the right frontal convexity, similar to priors. No midline shift. No impending herniation. Moderate vasogenic edema is associated with the right temporal lesion, increased from priors, and could predispose the patient to seizures. Other lesions also are roughly similar,  including a spherical 7 mm lesion deep to the insula. There is slight increased edema surrounding the right cerebellar lesion compared with priors, but there is slightly less edema surrounding the left parietal subcortical lesion. A few more lesions are visible than were previously noted on the 12/18/2013 study, but there was considerable motion on that exam.  Marrow heterogeneity in the clivus could suggest additional tumor involvement. Low T1 signal intensity bone marrow in the upper cervical region unchanged from priors.  IMPRESSION: Suspected slight progression of metastatic disease, with a few more lesions visible than were observed on 12/18/2013, as well as increased vasogenic edema surrounding the right middle cranial fossa osseous lesion, and right cerebellar lesion. No impending herniation or midline shift.  No lesions involving the parasagittal posterior frontal cortex which might cause bilateral leg weakness.   Electronically Signed   By: Rolla Flatten M.D.   On: 01/20/2014 20:09   Mr Thoracic Spine W Contrast  01/21/2014   CLINICAL DATA:  63 year old female with stage IV breast cancer. New onset lower extremity weakness and unable to move the lower extremities. Initial encounter.  EXAM: MRI THORACIC SPINE WITH CONTRAST  TECHNIQUE: Multiplanar and multiecho pulse sequences of the thoracic spine were obtained with intravenous contrast.  CONTRAST:  13mL MULTIHANCE GADOBENATE DIMEGLUMINE 529 MG/ML IV SOLN  COMPARISON:  Lumbar MRI 01/20/2014.  FINDINGS: Diffusely abnormal bone marrow signal. Subsequently, cervical vertebral delineation is difficult on the sagittal scout view. The numbering system here agrees with that on the recent lumbar exam.  Diffuse thoracic spine and widespread posterior rib metastases. Still, no thoracic epidural or extraosseous tumor identified.  Intra-axial partially exophytic solidly enhancing mass of the thoracic spinal cord centered at the T8 level measures 7 x 9 x 17 mm (AP by  transverse by CC) and is associated with widespread thoracic spinal cord edema. Only a portion of the conus medullaris, and the ventral lower cervical cord is spared. Mild cord expansion suspected below the level of the tumor.  There is also a much smaller (2 mm) enhancing cord metastasis at the T2-T3 level just to the right of midline (series 10, image 8).  No definite cauda equina tumor. No abnormal dural thickening or enhancement.  Otherwise, bilateral lung nodules are suggested, and central right liver mass may have increased (series 7, image 39). See Chest abdomen pelvis CT 01/14/2014.  IMPRESSION: 1. 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 Associated spinal cord edema is widespread. 2. Diffuse osseous metastatic disease. Study discussed by telephone with Dr. [name] on 01/21/2014 at 15:29 .   Electronically Signed   By: Lars Pinks M.D.   On: 01/21/2014 15:31   Mr Lumbar Spine W Wo Contrast  01/20/2014   CLINICAL DATA:  New onset of lower  extremity weakness. Breast cancer.  EXAM: MRI LUMBAR SPINE WITHOUT AND WITH CONTRAST  TECHNIQUE: Multiplanar and multiecho pulse sequences of the lumbar spine were obtained without and with intravenous contrast.  CONTRAST:  43mL MULTIHANCE GADOBENATE DIMEGLUMINE 529 MG/ML IV SOLN  COMPARISON:  CT ABD/PELVIS W CM dated 01/14/2014; MR HEAD WO/W CM dated 01/20/2014; NM PET IMAGE RESTAG (PS) SKULL BASE TO THIGH dated 10/08/2013  FINDINGS: The scan extends from midportion T11 through the sacrum.  Widespread marrow heterogeneity reflecting osseous metastatic disease and post treatment effect. No areas of pathologic compression deformity are evident. There is no visible epidural tumor. The alignment is anatomic.  No pelvic masses.  Bladder decompressed with Foley catheter.  No disc protrusion or spinal stenosis. Post infusion imaging demonstrates no abnormal intraspinal enhancement of the nerve roots or distal conus.  On sagittal T2 and  STIR imaging, the conus appears abnormal, displaying intramedullary T2 bright signal and slight cord enlargement. An intramedullary metastasis above the field-of-view, at T10 or above, is suspected, with associated distal cord edema. There is no definite abnormal enhancement of the visualized thoracic cord or conus on post infusion imaging.  IMPRESSION: Suspected intramedullary metastasis above the field-of-view of lumbar spine MRI. The distal thoracic cord and conus appear to display T2 bright signal concerning for edema. Thoracic spine MRI without and with contrast recommended for further evaluation.  No evidence for spinal stenosis, dropped metastases to the cauda equina, or intraspinal mass lesion in the lumbar or sacral segments.  Widespread osseous disease without pathologic compression fracture or malalignment.   Electronically Signed   By: Rolla Flatten M.D.   On: 01/20/2014 19:19   Ct Abdomen Pelvis W Contrast  01/14/2014   CLINICAL DATA:  restaging stage IV cancerRestaging stage IV cancer  EXAM: CT CHEST, ABDOMEN AND PELVIS WITHOUT CONTRAST  TECHNIQUE: Multidetector CT imaging of the chest, abdomen and pelvis was performed following the standard protocol without IV contrast.  COMPARISON:  None.  MR CHEST MEDIAS WO/W CM dated 12/18/2013; NM PET IMAGE RESTAG (PS) SKULL BASE TO THIGH dated 03/13/2013; NM PET IMAGE RESTAG (PS) SKULL BASE TO THIGH dated 10/08/2013  FINDINGS: CT CHEST FINDINGS  The thoracic inlet is unremarkable.  A 6.1 mm lymph node is appreciated within the prevascular space. An 8.5 mm subcarinal lymph node is identified. A 10 mm left hilar lymph node is appreciated image 22 series 2.  Postsurgical changes identified within the right axilla in the region of the previously described index nodule. There is mild increased soft tissue attenuation within the surgical bed which may represent postsurgical change. Findings are again consistent with a lumpectomy within the lateral aspect of the right  breast. A left-sided port a catheter is identified with tip projecting in the region of the superior vena caval right atrial junction.  The an 11 mm lingular nodule is again identified. There is slight increased peripheral spiculation below though the size is unchanged. An ill-defined area of ground-glass density projects in the anterior base of the lingula image 23 series for which is increased and size and conspicuity measuring 8.8 mm. A small adjacent satellite nodule projects posteriorly measuring 4 mm. The 5 mm nodule within the periphery of the lingula image 20 is slightly decreased in conspicuity measuring 4 mm. The spiculated nodule within the left lower lobe image 34 series 4 is unchanged measuring 12 x 9 mm.  Stable subpleural interstitial changes identified within the anterior aspect of the right upper lobe. A small 4.2 mm nodule was  developed within the anterior aspect of the right upper lobe image 16 series 4. A 7.3 mm spiculated nodule projects anteriorly within the right middle lobe image 22 series 4. A 5.5 mm nodule is also identified posteriorly within the right middle lobe seen image. The pulmonary nodule in the anterior aspect of the right middle lobe has increased in size and extends to the subpleural region with greatest confluence measuring 17 x 9 mm image 32. When compared to the previous study there has been interval development of smaller less than 5 mm size pulmonary nodules within the base of the right middle lobe. The 7 mm pulmonary nodule posteriorly within the right lower lobe image 35 series 4 has increased in size now measuring 8 mm. A 9 mm spiculated nodules appreciated centrally within the right upper lobe image 13 series 4.  No focal regions of consolidation are identified nor evidence of focal infiltrates. There is prominence of the interstitial markings within the right and left hemithoraces.  CT ABDOMEN AND PELVIS FINDINGS  Evaluation of the liver demonstrates low attenuating  ill-defined nodules within the anterior dome of the right lobe of the liver as well as adjacent to the the junction of the right and left lobes of the liver image 45 series 2. A dominant nodules appreciated within the anterior central portion of the right lobe of the liver demonstrating heterogeneous enhancement and measuring 19 x 16 mm in AP by transverse dimensions, increased in size when correlated with dated 10/08/2013.  The spleen, adrenals, pancreas, kidneys are unremarkable.  There is no evidence of bowel obstruction, enteritis, colitis, diverticulitis, nor appendicitis.  There is no evidence of abdominal aortic aneurysm. The celiac, SMA, IMA, portal vein are opacified.  There is no evidence of abdominal free fluid, loculated fluid collections, nor masses. A 9 mm lymph node is appreciated along the gastrohepatic ligament. A 11 mm celiac plexus lymph node is identified. Image 47 series 2.  Evaluation of the pelvis demonstrates an oval shaped area of soft tissue attenuation in the posterior left adnexa measuring 3.8 x 1.9 cm in AP by transverse dimensions demonstrating Hounsfield units of 41.7. No further pelvic masses, free fluid, loculated fluid collections or adenopathy is appreciated.  Evaluation osseous structures demonstrates diffuse sclerotic density throughout the appendicular and axial skeleton. An expansile lesion is identified within the inferior pubic ramus on the right which may reflect sequela prior fracture likely pathologic or an expansile focus of metastatic disease.  IMPRESSION: 1. Progression of metastatic disease within the mediastinum and lung parenchyma. There been increased size of the lung nodules and increased numbers. Interstitial findings within the lungs which may reflect the sequela of lymph and take infiltration. Chronic interstitial fibrotic change is of diagnostic consideration. 2. Interval increase  burden of metastatic disease within the liver. 3. Diffuse metastatic disease  throughout the appendicular and axial skeleton 4. Findings concerning for a soft tissue mass within the pelvis on the left which may reflect a metastatic disease or possibly an ovarian origin. 5. Re evaluation with total body PET-CT is recommended.   Electronically Signed   By: Margaree Mackintosh M.D.   On: 01/14/2014 11:40    Scheduled Meds: . B-complex with vitamin C  1 tablet Oral Daily  . dexamethasone  4 mg Intravenous Q12H  . diazepam  2.5 mg Intravenous QHS,MR X 1  . feeding supplement (RESOURCE BREEZE)  1 Container Oral TID BM  . gabapentin  100 mg Oral BID  . levothyroxine  75 mcg  Oral QAC breakfast  . multivitamin with minerals  1 tablet Oral Daily   Continuous Infusions: . sodium chloride 1,000 mL (01/24/14 0145)    Time spent: 25 minutes.    LOS: 11 days   [facility] Pager [phone]. If unable to reach me by pager, please call my cell phone at [phone].  *Please note that the hospitalists switch teams on Wednesdays. Please call the flow manager at [phone] if you are having difficulty reaching the hospitalist taking care of this patient as she can update you and provide the most up-to-date pager number of provider caring for the patient. If 8PM-8AM, please contact night-coverage at [email or web address], password [facility]  01/24/2014, 1:00 PM    **Disclaimer: This note was dictated with voice recognition software. Similar sounding words can inadvertently be transcribed and this note may contain transcription errors which may not have been corrected upon publication of note.**     In an effort to keep you and your family informed about your hospital stay, I am providing you with this information sheet. If you or your family have any questions, please do not hesitate to have the nursing staff page me to set up a meeting time.  NAKOTA ACKERT 01/24/2014 11 (Number of days in the hospital)  Treatment team:  Dr. Jacquelynn Cree, Hospitalist (Internist)  Dr.  Lurline Del (Oncology)  Dr. Arloa Koh (Radiation Oncology)  Dr. Rhea Pink (Palliative Care)  Active Treatment Issues with Plan: Principal Problem:  Generalized weakness, lower extremity weakness   You have high-grade metastatic carcinoma that has spread to the brain and spinal cord, which is causing you to have lower leg weakness.   Dr. Valere Dross does not feel further radiation treatment will be of any benefit.  Dr. Jana Hakim feels that your cancer has entered a terminal stage, and he recommends hospice care.  You are too weak to return home without 24 hour care, so other options are to go to a rehab center or to hire private duty caregivers to remain in your home. Active Problems:  Anxiety Cancer is an extremely scary diagnosis, and patients with cancer often feel anxious about treatments and prognosis once treatment is no longer advisable.  We can help you with anxiety issues if this is a problem.  It is common for anxiety to manifest as distrust toward your treating physicians or anger.  We will continue to try to help you work through these issues. 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  Dr. Jana Hakim does not feel there are any further treatment options.   Bone pain  This is from cancer invading the bones. Dexamethasone helps to reduce pain from cancer invading the bones.  Underactive thyroid  Continue synthroid 75 mcg daily.  Low blood counts  You received a blood transfusion during this hospital stay.   Malnutrition  We continue to encourage you to take in good nutrition as you are able.   Anticipated discharge date: Tomorrow after the palliative care meeting with your family.  If you choose to go to a rehab  facility, a bed may not be available until 01/27/14.  You are medically stable to go to a rehab facility or home if you can arrange for 24 hour supervision.

## 2014-01-24 NOTE — Care Management Note (Signed)
CARE MANAGEMENT NOTE  01/24/2014  Patient: Debbie Larsen, Debbie Larsen Account Number: [id number]  Date Initiated: 01/24/2014 Documentation initiated by: Brisia Schuermann  Subjective/Objective Assessment:   63 yo female admitted with hyponatremia. Hx of Metastatic Carcinoma   Action/Plan:   Patient provided with discharge plan options which include the following: 1.[place] ( Patient options may include facilities outside of [facility]) 2. Home with [place] (Patient will require arranged 24 hour care to accompany home health service to choose this option). Patient provided with list of [place] and [place] to assist with this option. 3. Patient can discharge from current acute setting and admit self to another acute care hospital of choice. Case Manager and Clinical Social Worker can arrange transportation to another facility at an out-of-pocket cost.  Anticipated DC Date: Per MD Anticipated DC Plan: Per pt.'s choice of options  DC Planning Services   Case Manager  Choice offered to / List presented to: Patient provided a list of durable medical equipment accessible for home use. 1. Hospital Bed 2. 3N1 3. Shower Chair 4. Rolling Walker 5. Wheelchair 6. Tub bench 7. Oxygen 8. [place]         Discharge Disposition:  Per UR Regulation: Patient informed her medical chart is reviewed for medical necessity/level of care/duration of stay. Patient informed upon physician's assessment of medical stability and upon a physician discharge, patient insurance payer will cease coverage of hospital stay. At that time patient will have limited time to remain in hospital setting before other parties are involved.

## 2014-01-24 NOTE — Progress Notes (Signed)
CSW continuing to follow for disposition planning.  CSW and RNCM met with pt at bedside. CSW discussed with pt that pt Medicaid does not have the long term care benefit and that pt will need to apply for social security disability in order for pt Medicaid to transition to long term care Medicaid. CSW informed pt that due to this [facility] was going to be unable to offer pt a bed. CSW discussed that CSW will have to initiate new search and will have to include facilities outside of [facility]. Pt expressed understanding, but hopeful for something close to home and CSW emphasized that there is no guarantee that facility will be close to home. CSW discussed with Clinical Social Work Mudlogger, [employer] who stated that pt would need to go to a SNF that will accept a letter of guarantee for payment while pt is in process of having Medicaid switched to long term Medicaid.   The following disposition options were discussed with pt in detail. Patient provided with discharge plan options which include the following:   1.[place] ( Patient options may include facilities outside of [facility])  2. Home with [place] (Patient will require arranged 24 hour care to accompany home health service to choose this option). Patient provided with list of [place] and [place] to assist with this option.  3. Patient can discharge from current acute setting and admit self to another acute care hospital of choice. Case Manager and Clinical Social Worker can arrange transportation to another facility at an out-of-pocket cost.  4. [facility] if pt is agreeable to comfort care, but at this time pt continues to request unrealistic treatment options such as bone marrow transplantation and other inappropriate therapies including immunotherapies  Pt and pt family have plans to have palliative care meeting tomorrow. Pt is anticipated to  discharge tomorrow after palliative care meeting if she chooses to return home with [facility] and 24 hour care. If pt chooses to go to a rehab facility, a bed will likely not be available until 01/27/2014 due to insurance barriers. If pt agrees to comfort care, then CSW will have to inquire about bed availability at residential hospice.   CSW spoke with pt brother and pt sister quickly in hallway as pt brother and pt sister were going to meet with a lawyer regarding pt will. Pt brother and pt sister are concerned about getting a notary to complete pt documents for pt will. CSW discussed that notaries in the hospital do not notarize documents other than healthcare documents and encouraged pt brother to consult with pt lawyer regarding hiring a notary. CSW confirmed with pt brother that medical staff can assist in locating visitors to use as witnesses to witness pt signature, but hospital could not provide notary for those documents. Pt brother and pt sister expressed understanding. CSW was unable to discuss with pt brother and pt sister in detail about discussion held earlier today with pt, this CSW , and RNCM regarding disposition plans.   CSW updated pt clinical information and did a multi-county SNF search seeking a letter of guarantee bed. CSW is awaiting response from facilities in order to see what facilities may be able to offer pt a letter of guarantee bed. If pt elects to go to rehab facility, CSW hopeful that facility can be arranged on Monday, but pt insurance status is a barrier to placement at this time.  CSW to follow up  on recommendations from palliative care meeting and assist with pt disposition needs as appropriate.  Alison Murray, MSW, [place] Work [phone]

## 2014-01-25 DIAGNOSIS — A419 Sepsis, unspecified organism: Secondary | ICD-10-CM

## 2014-01-25 DIAGNOSIS — N390 Urinary tract infection, site not specified: Secondary | ICD-10-CM

## 2014-01-25 DIAGNOSIS — G8220 Paraplegia, unspecified: Secondary | ICD-10-CM

## 2014-01-25 DIAGNOSIS — Z515 Encounter for palliative care: Secondary | ICD-10-CM

## 2014-01-25 DIAGNOSIS — E43 Unspecified severe protein-calorie malnutrition: Secondary | ICD-10-CM

## 2014-01-25 NOTE — Progress Notes (Signed)
PROGRESS NOTE   Debbie Larsen FYB:[id number] DOB: [birth year] DOA: 01/13/2014 PCP: Melrose Nakayama, MD  Brief narrative: Debbie Larsen is an 63 y.o. female with past medical history of right breast adenocarcinoma diagnoses in 04/2001 s/p chemo and RT to right breast, high-grade metastatic carcinoma, unknown primary, bone and brain metastasis status post systemic chemo, was treated as breast cancer with Abraxane until 01/2013. Repeat PET scan on 03/13/2013 showed evidence for disease progression. Pt was admitted 01/13/2014 for progressive weakness, poor oral intake, weight loss. She has been started on Decadron on the admission but continues to have no strength in lower extremities. Her MRI lumbar and thoracic spine showed extensive metastatic disease with cord edema and per radiation oncolgy palliative radiotherapy will  not provide much improvement.   Assessment/Plan: Principal Problem:  Progressive generalized weakness, lower extremity weakness/ paralysis/ failure to thrive  Patient has high-grade metastatic carcinoma. 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  Her prognosis is extremely poor. No role for further radiation treatment per Dr. Valere Dross.  Her oncologist has repeatedly recommended hospice care, but is now refusing discharge to a residential hospice facility even though she does not have any local family support and cannot care for herself at home given her lower extremity paralysis.  She wants rehabilitation, although her paralysis cannot be reversed. She continues to request unrealistic treatment options such as bone marrow transplantation and other inappropriate therapies including immunotherapies.  Continue decadron.  Palliative care family meeting held with the patient and her brother and sister on 01/25/14. Patient continues to be in denial about her prognosis.  I contacted Dr. Aline Brochure (oncologist on call at [facility]) and he feels that the patient wants a second opinion,  she should followup at the breast clinic as an outpatient. Active Problems:  Paranoia Unclear if this is from her brain metastasis versus an underlying psychiatric disorder. She is frankly paranoid and accusing her various treating physicians of withholding treatment from her.  Evaluated by psychiatrist and found to have capacity. Metastatic carcinoma of undetermined primary with metastases to bones and brain  CT chest showed progression of metastatic disease within the mediastinum and lung parenchyma, increased size of the lung nodules and number of nodules, interval increase in liver metastatic disease, soft tissue mass within the pelvis on the left which could possibly reflect metastatic disease or possibly primary ovarian origin; per oncology primary possibly breast or lung. Unfortunately, there are no viable treatment options left for her. The palliative care team has been following her. Bone pain  Related to bone metastases. Per oncology recommendations patient was given one dose of Zometa on 01/16/2014. Continue dexamethasone, which has been increased by the palliative care team.  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  Hypothyroidism  Continue synthroid 75 mcg daily.  Anemia / Thrombocytopenia  Secondary to chronic disease due to malignancy and sequela of chemotherapy. Hemoglobin was 7.9 on 3/22 and she has received 1 unit of PRBC transfusion. Hemoglobin currently stable. Elevated liver enzymes  Likely due to metastatic disease.  Severe protein calorie malnutrition  Secondary to progressive malignancy. As mentioned above, we have encouraged by mouth intake. Patient refused to take feeding supplements.   Code Status: DNR/DNI  Family Communication: Siblings at bedside. Disposition Plan: Possible residential hospice placement versus SNF.  Consultants:  Oncology (Dr. Julien Nordmann)  Rad oncology  Palliative care  Psychiatry  Procedures:  None   Antibiotics:  None     HPI/Subjective: Debbie Larsen  continues to vocalize distrust about her treating physicians and denial about her prognosis and lack of treatment options.  She asserts she is "getting stronger" and yet tells me she is not ready for discharge.  Accuses her physicians of "kicking her out" of the hospital (despite the fact that she has been here for 12 days).    Objective: Filed Vitals:   01/24/14 0614 01/24/14 1440 01/24/14 2240 01/25/14 0659  BP: 112/72 115/72 128/70 124/65  Pulse: 75 71 78 86  Temp: 98.8 F (37.1 C) 98.2 F (36.8 C) 98 F (36.7 C) 99.2 F (37.3 C)  TempSrc: Oral Oral Oral Oral  Resp: 20 20 20 20   Height:      Weight:      SpO2: 100% 100% 100% 100%    Intake/Output Summary (Last 24 hours) at 01/25/14 0824 Last data filed at 01/25/14 0658  Gross per 24 hour  Intake    780 ml  Output   2400 ml  Net  -1620 ml    Exam: Gen:  NAD Cardiovascular:  RRR, No M/R/G Respiratory:  Lungs CTAB Gastrointestinal:  Abdomen soft, NT/ND, + BS Extremities:  No C/E/C  Data Reviewed: Basic Metabolic Panel:  Recent Labs Lab 01/19/14 0540 01/21/14 0616 01/24/14 0546  NA 134* 126* 133*  K 4.1 4.3 4.3  CL 101 90* 98  CO2 22 21 22   GLUCOSE 139* 174* 136*  BUN 6 13 16   CREATININE 0.53 0.51 0.49*  CALCIUM 8.2* 8.6 8.9   GFR Estimated Creatinine Clearance: 52.4 ml/min (by C-G formula based on Cr of 0.49).  CBC:  Recent Labs Lab 01/19/14 0540 01/21/14 0616 01/24/14 0546  WBC 4.5 8.8 7.8  HGB 7.9* 10.5* 10.0*  HCT 23.4* 30.3* 29.6*  MCV 80.7 79.3 80.0  PLT 99* 132* 184   Microbiology No results found for this or any previous visit (from the past 240 hour(s)).   Procedures and Diagnostic Studies: Ct Chest W Contrast  01/14/2014   CLINICAL DATA:  restaging stage IV cancerRestaging stage IV cancer  EXAM: CT CHEST, ABDOMEN AND PELVIS WITHOUT CONTRAST  TECHNIQUE: Multidetector CT imaging of the chest, abdomen and pelvis was performed following the standard  protocol without IV contrast.  COMPARISON:  None.  MR CHEST MEDIAS WO/W CM dated 12/18/2013; NM PET IMAGE RESTAG (PS) SKULL BASE TO THIGH dated 03/13/2013; NM PET IMAGE RESTAG (PS) SKULL BASE TO THIGH dated 10/08/2013  FINDINGS: CT CHEST FINDINGS  The thoracic inlet is unremarkable.  A 6.1 mm lymph node is appreciated within the prevascular space. An 8.5 mm subcarinal lymph node is identified. A 10 mm left hilar lymph node is appreciated image 22 series 2.  Postsurgical changes identified within the right axilla in the region of the previously described index nodule. There is mild increased soft tissue attenuation within the surgical bed which may represent postsurgical change. Findings are again consistent with a lumpectomy within the lateral aspect of the right breast. A left-sided port a catheter is identified with tip projecting in the region of the superior vena caval right atrial junction.  The an 11 mm lingular nodule is again identified. There is slight increased peripheral spiculation below though the size is unchanged. An ill-defined area of ground-glass density projects in the anterior base of the lingula image 23 series for which is increased and size and conspicuity measuring 8.8 mm. A small adjacent satellite nodule projects posteriorly measuring 4 mm. The 5 mm nodule within the periphery of the lingula image 20 is  slightly decreased in conspicuity measuring 4 mm. The spiculated nodule within the left lower lobe image 34 series 4 is unchanged measuring 12 x 9 mm.  Stable subpleural interstitial changes identified within the anterior aspect of the right upper lobe. A small 4.2 mm nodule was developed within the anterior aspect of the right upper lobe image 16 series 4. A 7.3 mm spiculated nodule projects anteriorly within the right middle lobe image 22 series 4. A 5.5 mm nodule is also identified posteriorly within the right middle lobe seen image. The pulmonary nodule in the anterior aspect of the right  middle lobe has increased in size and extends to the subpleural region with greatest confluence measuring 17 x 9 mm image 32. When compared to the previous study there has been interval development of smaller less than 5 mm size pulmonary nodules within the base of the right middle lobe. The 7 mm pulmonary nodule posteriorly within the right lower lobe image 35 series 4 has increased in size now measuring 8 mm. A 9 mm spiculated nodules appreciated centrally within the right upper lobe image 13 series 4.  No focal regions of consolidation are identified nor evidence of focal infiltrates. There is prominence of the interstitial markings within the right and left hemithoraces.  CT ABDOMEN AND PELVIS FINDINGS  Evaluation of the liver demonstrates low attenuating ill-defined nodules within the anterior dome of the right lobe of the liver as well as adjacent to the the junction of the right and left lobes of the liver image 45 series 2. A dominant nodules appreciated within the anterior central portion of the right lobe of the liver demonstrating heterogeneous enhancement and measuring 19 x 16 mm in AP by transverse dimensions, increased in size when correlated with dated 10/08/2013.  The spleen, adrenals, pancreas, kidneys are unremarkable.  There is no evidence of bowel obstruction, enteritis, colitis, diverticulitis, nor appendicitis.  There is no evidence of abdominal aortic aneurysm. The celiac, SMA, IMA, portal vein are opacified.  There is no evidence of abdominal free fluid, loculated fluid collections, nor masses. A 9 mm lymph node is appreciated along the gastrohepatic ligament. A 11 mm celiac plexus lymph node is identified. Image 47 series 2.  Evaluation of the pelvis demonstrates an oval shaped area of soft tissue attenuation in the posterior left adnexa measuring 3.8 x 1.9 cm in AP by transverse dimensions demonstrating Hounsfield units of 41.7. No further pelvic masses, free fluid, loculated fluid  collections or adenopathy is appreciated.  Evaluation osseous structures demonstrates diffuse sclerotic density throughout the appendicular and axial skeleton. An expansile lesion is identified within the inferior pubic ramus on the right which may reflect sequela prior fracture likely pathologic or an expansile focus of metastatic disease.  IMPRESSION: 1. Progression of metastatic disease within the mediastinum and lung parenchyma. There been increased size of the lung nodules and increased numbers. Interstitial findings within the lungs which may reflect the sequela of lymph and take infiltration. Chronic interstitial fibrotic change is of diagnostic consideration. 2. Interval increase  burden of metastatic disease within the liver. 3. Diffuse metastatic disease throughout the appendicular and axial skeleton 4. Findings concerning for a soft tissue mass within the pelvis on the left which may reflect a metastatic disease or possibly an ovarian origin. 5. Re evaluation with total body PET-CT is recommended.   Electronically Signed   By: Margaree Mackintosh M.D.   On: 01/14/2014 11:40   Mr Jeri Cos IO Contrast  01/20/2014   CLINICAL  DATA:  Breast cancer.  Evaluate for metastatic disease.  EXAM: MRI HEAD WITHOUT AND WITH CONTRAST  TECHNIQUE: Multiplanar, multiecho pulse sequences of the brain and surrounding structures were obtained without and with intravenous contrast.  CONTRAST:  MultiHance 9 mL.  COMPARISON:  MR HEAD WO/W CM dated 12/18/2013; MR C SPINE WO/W CM dated 02/21/2013  FINDINGS: No acute stroke or hemorrhage. No hydrocephalus or extra-axial fluid.  Intracranial metastatic disease is re-demonstrated, as was observed on the previous MR. Subcentimeter lesions are seen in the right cerebellar hemisphere, inferior vermis, right temporal lobe, left temporal lobe, and multiple lesions throughout the subcortical white matter of left greater than right cerebral hemispheres. The dominant lesion is an osseous  metastasis to the greater wing of the sphenoid on the right, measures 17 x 19 mm, unchanged in size from priors. A dural based lesion is also seen over the right frontal convexity, similar to priors. No midline shift. No impending herniation. Moderate vasogenic edema is associated with the right temporal lesion, increased from priors, and could predispose the patient to seizures. Other lesions also are roughly similar, including a spherical 7 mm lesion deep to the insula. There is slight increased edema surrounding the right cerebellar lesion compared with priors, but there is slightly less edema surrounding the left parietal subcortical lesion. A few more lesions are visible than were previously noted on the 12/18/2013 study, but there was considerable motion on that exam.  Marrow heterogeneity in the clivus could suggest additional tumor involvement. Low T1 signal intensity bone marrow in the upper cervical region unchanged from priors.  IMPRESSION: Suspected slight progression of metastatic disease, with a few more lesions visible than were observed on 12/18/2013, as well as increased vasogenic edema surrounding the right middle cranial fossa osseous lesion, and right cerebellar lesion. No impending herniation or midline shift.  No lesions involving the parasagittal posterior frontal cortex which might cause bilateral leg weakness.   Electronically Signed   By: Rolla Flatten M.D.   On: 01/20/2014 20:09   Mr Thoracic Spine W Contrast  01/21/2014   CLINICAL DATA:  63 year old female with stage IV breast cancer. New onset lower extremity weakness and unable to move the lower extremities. Initial encounter.  EXAM: MRI THORACIC SPINE WITH CONTRAST  TECHNIQUE: Multiplanar and multiecho pulse sequences of the thoracic spine were obtained with intravenous contrast.  CONTRAST:  72mL MULTIHANCE GADOBENATE DIMEGLUMINE 529 MG/ML IV SOLN  COMPARISON:  Lumbar MRI 01/20/2014.  FINDINGS: Diffusely abnormal bone marrow signal.  Subsequently, cervical vertebral delineation is difficult on the sagittal scout view. The numbering system here agrees with that on the recent lumbar exam.  Diffuse thoracic spine and widespread posterior rib metastases. Still, no thoracic epidural or extraosseous tumor identified.  Intra-axial partially exophytic solidly enhancing mass of the thoracic spinal cord centered at the T8 level measures 7 x 9 x 17 mm (AP by transverse by CC) and is associated with widespread thoracic spinal cord edema. Only a portion of the conus medullaris, and the ventral lower cervical cord is spared. Mild cord expansion suspected below the level of the tumor.  There is also a much smaller (2 mm) enhancing cord metastasis at the T2-T3 level just to the right of midline (series 10, image 8).  No definite cauda equina tumor. No abnormal dural thickening or enhancement.  Otherwise, bilateral lung nodules are suggested, and central right liver mass may have increased (series 7, image 39). See Chest abdomen pelvis CT 01/14/2014.  IMPRESSION: 1. Positive for  metastatic disease at the thoracic spinal cord at T8, and also T2-T3. Associated spinal cord edema is widespread. 2. Diffuse osseous metastatic disease. Study discussed by telephone with Dr. [name] on 01/21/2014 at 15:29 .   Electronically Signed   By: Lars Pinks M.D.   On: 01/21/2014 15:31   Mr Lumbar Spine W Wo Contrast  01/20/2014   CLINICAL DATA:  New onset of lower extremity weakness. Breast cancer.  EXAM: MRI LUMBAR SPINE WITHOUT AND WITH CONTRAST  TECHNIQUE: Multiplanar and multiecho pulse sequences of the lumbar spine were obtained without and with intravenous contrast.  CONTRAST:  100mL MULTIHANCE GADOBENATE DIMEGLUMINE 529 MG/ML IV SOLN  COMPARISON:  CT ABD/PELVIS W CM dated 01/14/2014; MR HEAD WO/W CM dated 01/20/2014; NM PET IMAGE RESTAG (PS) SKULL BASE TO THIGH dated 10/08/2013  FINDINGS: The scan extends from midportion T11 through the sacrum.  Widespread marrow  heterogeneity reflecting osseous metastatic disease and post treatment effect. No areas of pathologic compression deformity are evident. There is no visible epidural tumor. The alignment is anatomic.  No pelvic masses.  Bladder decompressed with Foley catheter.  No disc protrusion or spinal stenosis. Post infusion imaging demonstrates no abnormal intraspinal enhancement of the nerve roots or distal conus.  On sagittal T2 and STIR imaging, the conus appears abnormal, displaying intramedullary T2 bright signal and slight cord enlargement. An intramedullary metastasis above the field-of-view, at T10 or above, is suspected, with associated distal cord edema. There is no definite abnormal enhancement of the visualized thoracic cord or conus on post infusion imaging.  IMPRESSION: Suspected intramedullary metastasis above the field-of-view of lumbar spine MRI. The distal thoracic cord and conus appear to display T2 bright signal concerning for edema. Thoracic spine MRI without and with contrast recommended for further evaluation.  No evidence for spinal stenosis, dropped metastases to the cauda equina, or intraspinal mass lesion in the lumbar or sacral segments.  Widespread osseous disease without pathologic compression fracture or malalignment.   Electronically Signed   By: Rolla Flatten M.D.   On: 01/20/2014 19:19   Ct Abdomen Pelvis W Contrast  01/14/2014   CLINICAL DATA:  restaging stage IV cancerRestaging stage IV cancer  EXAM: CT CHEST, ABDOMEN AND PELVIS WITHOUT CONTRAST  TECHNIQUE: Multidetector CT imaging of the chest, abdomen and pelvis was performed following the standard protocol without IV contrast.  COMPARISON:  None.  MR CHEST MEDIAS WO/W CM dated 12/18/2013; NM PET IMAGE RESTAG (PS) SKULL BASE TO THIGH dated 03/13/2013; NM PET IMAGE RESTAG (PS) SKULL BASE TO THIGH dated 10/08/2013  FINDINGS: CT CHEST FINDINGS  The thoracic inlet is unremarkable.  A 6.1 mm lymph node is appreciated within the prevascular  space. An 8.5 mm subcarinal lymph node is identified. A 10 mm left hilar lymph node is appreciated image 22 series 2.  Postsurgical changes identified within the right axilla in the region of the previously described index nodule. There is mild increased soft tissue attenuation within the surgical bed which may represent postsurgical change. Findings are again consistent with a lumpectomy within the lateral aspect of the right breast. A left-sided port a catheter is identified with tip projecting in the region of the superior vena caval right atrial junction.  The an 11 mm lingular nodule is again identified. There is slight increased peripheral spiculation below though the size is unchanged. An ill-defined area of ground-glass density projects in the anterior base of the lingula image 23 series for which is increased and size and conspicuity measuring 8.8  mm. A small adjacent satellite nodule projects posteriorly measuring 4 mm. The 5 mm nodule within the periphery of the lingula image 20 is slightly decreased in conspicuity measuring 4 mm. The spiculated nodule within the left lower lobe image 34 series 4 is unchanged measuring 12 x 9 mm.  Stable subpleural interstitial changes identified within the anterior aspect of the right upper lobe. A small 4.2 mm nodule was developed within the anterior aspect of the right upper lobe image 16 series 4. A 7.3 mm spiculated nodule projects anteriorly within the right middle lobe image 22 series 4. A 5.5 mm nodule is also identified posteriorly within the right middle lobe seen image. The pulmonary nodule in the anterior aspect of the right middle lobe has increased in size and extends to the subpleural region with greatest confluence measuring 17 x 9 mm image 32. When compared to the previous study there has been interval development of smaller less than 5 mm size pulmonary nodules within the base of the right middle lobe. The 7 mm pulmonary nodule posteriorly within the  right lower lobe image 35 series 4 has increased in size now measuring 8 mm. A 9 mm spiculated nodules appreciated centrally within the right upper lobe image 13 series 4.  No focal regions of consolidation are identified nor evidence of focal infiltrates. There is prominence of the interstitial markings within the right and left hemithoraces.  CT ABDOMEN AND PELVIS FINDINGS  Evaluation of the liver demonstrates low attenuating ill-defined nodules within the anterior dome of the right lobe of the liver as well as adjacent to the the junction of the right and left lobes of the liver image 45 series 2. A dominant nodules appreciated within the anterior central portion of the right lobe of the liver demonstrating heterogeneous enhancement and measuring 19 x 16 mm in AP by transverse dimensions, increased in size when correlated with dated 10/08/2013.  The spleen, adrenals, pancreas, kidneys are unremarkable.  There is no evidence of bowel obstruction, enteritis, colitis, diverticulitis, nor appendicitis.  There is no evidence of abdominal aortic aneurysm. The celiac, SMA, IMA, portal vein are opacified.  There is no evidence of abdominal free fluid, loculated fluid collections, nor masses. A 9 mm lymph node is appreciated along the gastrohepatic ligament. A 11 mm celiac plexus lymph node is identified. Image 47 series 2.  Evaluation of the pelvis demonstrates an oval shaped area of soft tissue attenuation in the posterior left adnexa measuring 3.8 x 1.9 cm in AP by transverse dimensions demonstrating Hounsfield units of 41.7. No further pelvic masses, free fluid, loculated fluid collections or adenopathy is appreciated.  Evaluation osseous structures demonstrates diffuse sclerotic density throughout the appendicular and axial skeleton. An expansile lesion is identified within the inferior pubic ramus on the right which may reflect sequela prior fracture likely pathologic or an expansile focus of metastatic disease.   IMPRESSION: 1. Progression of metastatic disease within the mediastinum and lung parenchyma. There been increased size of the lung nodules and increased numbers. Interstitial findings within the lungs which may reflect the sequela of lymph and take infiltration. Chronic interstitial fibrotic change is of diagnostic consideration. 2. Interval increase  burden of metastatic disease within the liver. 3. Diffuse metastatic disease throughout the appendicular and axial skeleton 4. Findings concerning for a soft tissue mass within the pelvis on the left which may reflect a metastatic disease or possibly an ovarian origin. 5. Re evaluation with total body PET-CT is recommended.   Electronically Signed  By: Margaree Mackintosh M.D.   On: 01/14/2014 11:40    Scheduled Meds: . B-complex with vitamin C  1 tablet Oral Daily  . dexamethasone  4 mg Intravenous Q12H  . diazepam  2.5 mg Intravenous QHS,MR X 1  . feeding supplement (RESOURCE BREEZE)  1 Container Oral TID BM  . gabapentin  100 mg Oral BID  . levothyroxine  75 mcg Oral QAC breakfast  . multivitamin with minerals  1 tablet Oral Daily   Continuous Infusions: . sodium chloride 1,000 mL (01/24/14 2347)    Time spent: 25 minutes.    LOS: 12 days   [facility] Pager [phone]. If unable to reach me by pager, please call my cell phone at [phone].  *Please note that the hospitalists switch teams on Wednesdays. Please call the flow manager at [phone] if you are having difficulty reaching the hospitalist taking care of this patient as she can update you and provide the most up-to-date pager number of provider caring for the patient. If 8PM-8AM, please contact night-coverage at [email or web address], password [email or web address]  01/25/2014, 8:24 AM    **Disclaimer: This note was dictated with voice recognition software. Similar sounding words can inadvertently be transcribed and this note may contain transcription errors which may not have been  corrected upon publication of note.**     In an effort to keep you and your family informed about your hospital stay, I am providing you with this information sheet. If you or your family have any questions, please do not hesitate to have the nursing staff page me to set up a meeting time.  Debbie Larsen 01/25/2014 12 (Number of days in the hospital)  Treatment team:  Dr. Jacquelynn Cree, Hospitalist (Internist)  Dr. Lurline Del (Oncology)  Dr. Arloa Koh (Radiation Oncology)  Dr. 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   Dr. Valere Dross does not feel further radiation treatment will be of any benefit as you already had whole brain radiation, and he feels spinal cord radiation will not result in regaining your ability to walk.  Dr. Jana Hakim feels that your cancer has entered a terminal stage, and he recommends hospice care.  I also spoke with Dr. Aline Brochure who is the oncologist on call at [facility].  He does not feel that transfer is warranted and recommends that you follow up at [facility] as an outpatient if you want a 2nd opinion.  The number to call is [phone].  You are too weak to return home without 24 hour care, so other options are to go to a rehab center or to hire private duty caregivers to remain in your home. Active Problems:  Anxiety Cancer is an extremely scary diagnosis, and patients with cancer often feel anxious about treatments and prognosis once treatment is no longer advisable.  We can help you with anxiety issues if this is a problem.  It is common for anxiety to manifest as distrust toward your treating physicians or anger.  We will continue to try to help you work through these issues. 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  You were treated with targeted therapy with Gilotrif by Dr. Julien Nordmann, but had progression of disease on this therapy so further treatment with this class of drugs is not recommended.  Dr. Jana Hakim does not feel there are any further treatment options.   Bone pain  This is from cancer invading the bones. Dexamethasone helps to reduce pain from cancer invading the bones.  Underactive thyroid  Continue synthroid 75 mcg daily.  Low blood counts  You received a blood transfusion during this hospital stay.   Malnutrition  We continue to encourage you to take in good nutrition as you are able.   Anticipated discharge date: You are medically stable to go to a rehab facility or home if you can arrange for 24 hour supervision.  If you opt for rehab, you will be discharged when a bed is available.

## 2014-01-25 NOTE — Progress Notes (Signed)
PT Cancellation Note  Patient Details Name: Debbie Larsen MRN: [id number] DOB: 1951-01-07   Cancelled Treatment:    Reason Eval/Treat Not Completed: Other (comment) spoke with RN earlier today; Pt just finished second palliative care meeting and Dr. Rockne Menghini currently on the phone with [facility]; pt continues with unrealistic expectations per [facility] notes and has been through quite a lot today; will defer  at this time and attempt again as schedule permits; noted plan is now for SNF with prognosis <6wks-pt unable to D/c before Monday a the earliest.   [facility] 01/25/2014, 2:52 PM

## 2014-01-25 NOTE — Progress Notes (Signed)
Per PMT MD, Dr. Hilma Favors, Pt will likely need SNF.  Chart review.    Noted from Weekday CSW's note that Pt not able to d/c to SNF until Monday due to issues surrounding Pt's insurance.  CSW to continue to follow.  Bernita Raisin, Home Work [phone]

## 2014-01-25 NOTE — Progress Notes (Signed)
Patient is asserting her wish for treatment, recovery, and that her faith will heal her. She is waiting for a new "cutting edge" treatment. Profound denial and bargaining, unwillingness to accept factual information about her condition-she could not look at her scans. Unrealistic hope that she will walk again.   Brother and sister present for meeting, she did not want to involve her son. Her siblings are trying to be helpful-but patient continues to assert unrelenting hope that she will be cured.  Summary of Meeting:   Communication rules for our meeting were set clearly- I told her that I would only discuss what IS possible and not ruminate on treatment choices, interventions and novel therapies that were not possible-If she wanted to inquires about things like immunotherapy or clinical trails that I can only tell her that the are not being offered in our health system.  I summarized her condition, short prognosis <6 weeks, showed images of the cancer progression, high certainty that she will probably never walk again.  She asked about a second opinion at [facility], I offered to call but they would most likely not take her as an inpatient-she tells me she wants to be admitted to their neuro-radiation unit and that she knows the physician there. Dr. Rockne Menghini agreed to call.  While [facility] referral is in process she agrees to a [place] to "try to get her strength back-she insists on IV decadron- she has a port -we could probably arrange for this to be done at SNF-if not I told her it can be given PO and IM.  Need to find Rehab Center/SNF- she has medicaid pending.  She is simply not ready for hospice philosophically and may never be of her own accord and decision making.  Family understand that Su has asserted her preferences and while they are poor choices based on medical assessment and prognosis - while we can support realistic hope, Su will probably need to move forward and experience whatever comes  next- likely failure to improve or mobilize.   Lane Hacker, DO Palliative Medicine

## 2014-01-26 MED ORDER — MAGNESIUM CITRATE PO SOLN
1.0000 | Freq: Every day | ORAL | Status: DC | PRN
  Filled 2014-01-26: qty 296

## 2014-01-26 MED ORDER — MAGNESIUM CITRATE PO SOLN
1.0000 | Freq: Once | ORAL | Status: AC
  Administered 2014-01-26: 1 via ORAL

## 2014-01-26 NOTE — Progress Notes (Signed)
PROGRESS NOTE   Debbie Larsen QPR:[id number] DOB: 11/28/1950 DOA: 01/13/2014 PCP: Melrose Nakayama, MD  Brief narrative: Debbie Larsen is an 63 y.o. female with past medical history of right breast adenocarcinoma diagnoses in 04/2001 s/p chemo and RT to right breast, high-grade metastatic carcinoma, unknown primary, bone and brain metastasis status post systemic chemo, was treated as breast cancer with Abraxane until 01/2013. Repeat PET scan on 03/13/2013 showed evidence for disease progression. Pt was admitted 01/13/2014 for progressive weakness, poor oral intake, weight loss. She has been started on Decadron on the admission but continues to have no strength in lower extremities. Her MRI lumbar and thoracic spine showed extensive metastatic disease with cord edema and per radiation oncolgy palliative radiotherapy will  not provide much improvement.   Assessment/Plan: Principal Problem:  Progressive generalized weakness, lower extremity weakness/ paralysis/ failure to thrive  Patient has high-grade metastatic carcinoma. 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  Her prognosis is extremely poor. No role for further radiation treatment per Dr. Valere Dross.  Her oncologist has repeatedly recommended hospice care, but is now refusing discharge to a residential hospice facility even though she does not have any local family support and cannot care for herself at home given her lower extremity paralysis.  She wants rehabilitation, although her paralysis cannot be reversed. She continues to request unrealistic treatment options such as bone marrow transplantation and other inappropriate therapies including immunotherapies.  Continue decadron.  Palliative care family meeting held with the patient and her brother and sister on 01/25/14. Patient continues to be in denial about her prognosis.  I contacted Dr. Aline Brochure (oncologist on call at [facility]) and he feels that the patient wants a second opinion,  she should followup at the breast clinic as an outpatient. Patient continues to be unable to move her lower extremities. Active Problems:  Paranoia Unclear if this is from her brain metastasis versus an underlying psychiatric disorder. She continues to be paranoid and critical of her treating physicians.  Evaluated by psychiatrist and found to have capacity. Metastatic carcinoma of undetermined primary with metastases to bones and brain  CT chest showed progression of metastatic disease within the mediastinum and lung parenchyma, increased size of the lung nodules and number of nodules, interval increase in liver metastatic disease, soft tissue mass within the pelvis on the left which could possibly reflect metastatic disease or possibly primary ovarian origin; per oncology primary possibly breast or lung. Unfortunately, there are no viable treatment options left for her. The palliative care team has been following her. Bone pain  Related to bone metastases. Per oncology recommendations patient was given one dose of Zometa on 01/16/2014. Continue dexamethasone, which has been increased by the palliative care team.  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  Hypothyroidism  Continue synthroid 75 mcg daily.  Anemia / Thrombocytopenia  Secondary to chronic disease due to malignancy and sequela of chemotherapy. Hemoglobin was 7.9 on 3/22 and she has received 1 unit of PRBC transfusion. Hemoglobin currently stable. Elevated liver enzymes  Likely due to metastatic disease.  Severe protein calorie malnutrition  Secondary to progressive malignancy. As mentioned above, we have encouraged by mouth intake. Patient refused to take feeding supplements.   Code Status: DNR/DNI  Family Communication: Siblings at bedside 01/25/14. Disposition Plan: Possible residential hospice placement versus SNF.  Consultants:  Oncology (Dr. Julien Nordmann)  Rad oncology  Palliative care  Psychiatry  Procedures:    None   Antibiotics:  None    HPI/Subjective: Debbie Larsen continues to vocalize distrust about her treating physicians and denial about her prognosis and lack of treatment options.  She is resistant to attempts at redirection. She perseverates and repeats the same accusations over and over again. No significant complaints of pain, no nausea or vomiting but she does have some constipation.    Objective: Filed Vitals:   01/25/14 0659 01/25/14 1418 01/25/14 2228 01/26/14 0500  BP: 124/65 116/69 132/80 132/78  Pulse: 86 84 82 85  Temp: 99.2 F (37.3 C) 97.9 F (36.6 C) 98.2 F (36.8 C) 98.3 F (36.8 C)  TempSrc: Oral Oral Oral Oral  Resp: 20 20 18 18   Height:      Weight:      SpO2: 100% 100% 100% 100%    Intake/Output Summary (Last 24 hours) at 01/26/14 0723 Last data filed at 01/26/14 0600  Gross per 24 hour  Intake    720 ml  Output   4500 ml  Net  -3780 ml    Exam: Gen:  NAD Cardiovascular:  RRR, No M/R/G Respiratory:  Lungs CTAB Gastrointestinal:  Abdomen soft, NT/ND, + BS Extremities:  No C/E/C  Data Reviewed: Basic Metabolic Panel:  Recent Labs Lab 01/21/14 0616 01/24/14 0546  NA 126* 133*  K 4.3 4.3  CL 90* 98  CO2 21 22  GLUCOSE 174* 136*  BUN 13 16  CREATININE 0.51 0.49*  CALCIUM 8.6 8.9   GFR Estimated Creatinine Clearance: 52.4 ml/min (by C-G formula based on Cr of 0.49).  CBC:  Recent Labs Lab 01/21/14 0616 01/24/14 0546  WBC 8.8 7.8  HGB 10.5* 10.0*  HCT 30.3* 29.6*  MCV 79.3 80.0  PLT 132* 184   Microbiology No results found for this or any previous visit (from the past 240 hour(s)).   Procedures and Diagnostic Studies: Ct Chest W Contrast  01/14/2014   CLINICAL DATA:  restaging stage IV cancerRestaging stage IV cancer  EXAM: CT CHEST, ABDOMEN AND PELVIS WITHOUT CONTRAST  TECHNIQUE: Multidetector CT imaging of the chest, abdomen and pelvis was performed following the standard protocol without IV contrast.  COMPARISON:  None.   MR CHEST MEDIAS WO/W CM dated 12/18/2013; NM PET IMAGE RESTAG (PS) SKULL BASE TO THIGH dated 03/13/2013; NM PET IMAGE RESTAG (PS) SKULL BASE TO THIGH dated 10/08/2013  FINDINGS: CT CHEST FINDINGS  The thoracic inlet is unremarkable.  A 6.1 mm lymph node is appreciated within the prevascular space. An 8.5 mm subcarinal lymph node is identified. A 10 mm left hilar lymph node is appreciated image 22 series 2.  Postsurgical changes identified within the right axilla in the region of the previously described index nodule. There is mild increased soft tissue attenuation within the surgical bed which may represent postsurgical change. Findings are again consistent with a lumpectomy within the lateral aspect of the right breast. A left-sided port a catheter is identified with tip projecting in the region of the superior vena caval right atrial junction.  The an 11 mm lingular nodule is again identified. There is slight increased peripheral spiculation below though the size is unchanged. An ill-defined area of ground-glass density projects in the anterior base of the lingula image 23 series for which is increased and size and conspicuity measuring 8.8 mm. A small adjacent satellite nodule projects posteriorly measuring 4 mm. The 5 mm nodule within the periphery of the lingula image 20 is slightly decreased in conspicuity measuring 4 mm. The spiculated nodule within the left lower  lobe image 34 series 4 is unchanged measuring 12 x 9 mm.  Stable subpleural interstitial changes identified within the anterior aspect of the right upper lobe. A small 4.2 mm nodule was developed within the anterior aspect of the right upper lobe image 16 series 4. A 7.3 mm spiculated nodule projects anteriorly within the right middle lobe image 22 series 4. A 5.5 mm nodule is also identified posteriorly within the right middle lobe seen image. The pulmonary nodule in the anterior aspect of the right middle lobe has increased in size and extends to  the subpleural region with greatest confluence measuring 17 x 9 mm image 32. When compared to the previous study there has been interval development of smaller less than 5 mm size pulmonary nodules within the base of the right middle lobe. The 7 mm pulmonary nodule posteriorly within the right lower lobe image 35 series 4 has increased in size now measuring 8 mm. A 9 mm spiculated nodules appreciated centrally within the right upper lobe image 13 series 4.  No focal regions of consolidation are identified nor evidence of focal infiltrates. There is prominence of the interstitial markings within the right and left hemithoraces.  CT ABDOMEN AND PELVIS FINDINGS  Evaluation of the liver demonstrates low attenuating ill-defined nodules within the anterior dome of the right lobe of the liver as well as adjacent to the the junction of the right and left lobes of the liver image 45 series 2. A dominant nodules appreciated within the anterior central portion of the right lobe of the liver demonstrating heterogeneous enhancement and measuring 19 x 16 mm in AP by transverse dimensions, increased in size when correlated with dated 10/08/2013.  The spleen, adrenals, pancreas, kidneys are unremarkable.  There is no evidence of bowel obstruction, enteritis, colitis, diverticulitis, nor appendicitis.  There is no evidence of abdominal aortic aneurysm. The celiac, SMA, IMA, portal vein are opacified.  There is no evidence of abdominal free fluid, loculated fluid collections, nor masses. A 9 mm lymph node is appreciated along the gastrohepatic ligament. A 11 mm celiac plexus lymph node is identified. Image 47 series 2.  Evaluation of the pelvis demonstrates an oval shaped area of soft tissue attenuation in the posterior left adnexa measuring 3.8 x 1.9 cm in AP by transverse dimensions demonstrating Hounsfield units of 41.7. No further pelvic masses, free fluid, loculated fluid collections or adenopathy is appreciated.  Evaluation  osseous structures demonstrates diffuse sclerotic density throughout the appendicular and axial skeleton. An expansile lesion is identified within the inferior pubic ramus on the right which may reflect sequela prior fracture likely pathologic or an expansile focus of metastatic disease.  IMPRESSION: 1. Progression of metastatic disease within the mediastinum and lung parenchyma. There been increased size of the lung nodules and increased numbers. Interstitial findings within the lungs which may reflect the sequela of lymph and take infiltration. Chronic interstitial fibrotic change is of diagnostic consideration. 2. Interval increase  burden of metastatic disease within the liver. 3. Diffuse metastatic disease throughout the appendicular and axial skeleton 4. Findings concerning for a soft tissue mass within the pelvis on the left which may reflect a metastatic disease or possibly an ovarian origin. 5. Re evaluation with total body PET-CT is recommended.   Electronically Signed   By: Margaree Mackintosh M.D.   On: 01/14/2014 11:40   Mr Jeri Cos AJ Contrast  01/20/2014   CLINICAL DATA:  Breast cancer.  Evaluate for metastatic disease.  EXAM: MRI HEAD WITHOUT  AND WITH CONTRAST  TECHNIQUE: Multiplanar, multiecho pulse sequences of the brain and surrounding structures were obtained without and with intravenous contrast.  CONTRAST:  MultiHance 9 mL.  COMPARISON:  MR HEAD WO/W CM dated 12/18/2013; MR C SPINE WO/W CM dated 02/21/2013  FINDINGS: No acute stroke or hemorrhage. No hydrocephalus or extra-axial fluid.  Intracranial metastatic disease is re-demonstrated, as was observed on the previous MR. Subcentimeter lesions are seen in the right cerebellar hemisphere, inferior vermis, right temporal lobe, left temporal lobe, and multiple lesions throughout the subcortical white matter of left greater than right cerebral hemispheres. The dominant lesion is an osseous metastasis to the greater wing of the sphenoid on the right,  measures 17 x 19 mm, unchanged in size from priors. A dural based lesion is also seen over the right frontal convexity, similar to priors. No midline shift. No impending herniation. Moderate vasogenic edema is associated with the right temporal lesion, increased from priors, and could predispose the patient to seizures. Other lesions also are roughly similar, including a spherical 7 mm lesion deep to the insula. There is slight increased edema surrounding the right cerebellar lesion compared with priors, but there is slightly less edema surrounding the left parietal subcortical lesion. A few more lesions are visible than were previously noted on the 12/18/2013 study, but there was considerable motion on that exam.  Marrow heterogeneity in the clivus could suggest additional tumor involvement. Low T1 signal intensity bone marrow in the upper cervical region unchanged from priors.  IMPRESSION: Suspected slight progression of metastatic disease, with a few more lesions visible than were observed on 12/18/2013, as well as increased vasogenic edema surrounding the right middle cranial fossa osseous lesion, and right cerebellar lesion. No impending herniation or midline shift.  No lesions involving the parasagittal posterior frontal cortex which might cause bilateral leg weakness.   Electronically Signed   By: Rolla Flatten M.D.   On: 01/20/2014 20:09   Mr Thoracic Spine W Contrast  01/21/2014   CLINICAL DATA:  63 year old female with stage IV breast cancer. New onset lower extremity weakness and unable to move the lower extremities. Initial encounter.  EXAM: MRI THORACIC SPINE WITH CONTRAST  TECHNIQUE: Multiplanar and multiecho pulse sequences of the thoracic spine were obtained with intravenous contrast.  CONTRAST:  59mL MULTIHANCE GADOBENATE DIMEGLUMINE 529 MG/ML IV SOLN  COMPARISON:  Lumbar MRI 01/20/2014.  FINDINGS: Diffusely abnormal bone marrow signal. Subsequently, cervical vertebral delineation is difficult on  the sagittal scout view. The numbering system here agrees with that on the recent lumbar exam.  Diffuse thoracic spine and widespread posterior rib metastases. Still, no thoracic epidural or extraosseous tumor identified.  Intra-axial partially exophytic solidly enhancing mass of the thoracic spinal cord centered at the T8 level measures 7 x 9 x 17 mm (AP by transverse by CC) and is associated with widespread thoracic spinal cord edema. Only a portion of the conus medullaris, and the ventral lower cervical cord is spared. Mild cord expansion suspected below the level of the tumor.  There is also a much smaller (2 mm) enhancing cord metastasis at the T2-T3 level just to the right of midline (series 10, image 8).  No definite cauda equina tumor. No abnormal dural thickening or enhancement.  Otherwise, bilateral lung nodules are suggested, and central right liver mass may have increased (series 7, image 39). See Chest abdomen pelvis CT 01/14/2014.  IMPRESSION: 1. 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 Associated spinal  cord edema is widespread. 2. Diffuse osseous metastatic disease. Study discussed by telephone with Dr. [name] on 01/21/2014 at 15:29 .   Electronically Signed   By: Lars Pinks M.D.   On: 01/21/2014 15:31   Mr Lumbar Spine W Wo Contrast  01/20/2014   CLINICAL DATA:  New onset of lower extremity weakness. Breast cancer.  EXAM: MRI LUMBAR SPINE WITHOUT AND WITH CONTRAST  TECHNIQUE: Multiplanar and multiecho pulse sequences of the lumbar spine were obtained without and with intravenous contrast.  CONTRAST:  70mL MULTIHANCE GADOBENATE DIMEGLUMINE 529 MG/ML IV SOLN  COMPARISON:  CT ABD/PELVIS W CM dated 01/14/2014; MR HEAD WO/W CM dated 01/20/2014; NM PET IMAGE RESTAG (PS) SKULL BASE TO THIGH dated 10/08/2013  FINDINGS: The scan extends from midportion T11 through the sacrum.  Widespread marrow heterogeneity reflecting osseous metastatic disease and post treatment  effect. No areas of pathologic compression deformity are evident. There is no visible epidural tumor. The alignment is anatomic.  No pelvic masses.  Bladder decompressed with Foley catheter.  No disc protrusion or spinal stenosis. Post infusion imaging demonstrates no abnormal intraspinal enhancement of the nerve roots or distal conus.  On sagittal T2 and STIR imaging, the conus appears abnormal, displaying intramedullary T2 bright signal and slight cord enlargement. An intramedullary metastasis above the field-of-view, at T10 or above, is suspected, with associated distal cord edema. There is no definite abnormal enhancement of the visualized thoracic cord or conus on post infusion imaging.  IMPRESSION: Suspected intramedullary metastasis above the field-of-view of lumbar spine MRI. The distal thoracic cord and conus appear to display T2 bright signal concerning for edema. Thoracic spine MRI without and with contrast recommended for further evaluation.  No evidence for spinal stenosis, dropped metastases to the cauda equina, or intraspinal mass lesion in the lumbar or sacral segments.  Widespread osseous disease without pathologic compression fracture or malalignment.   Electronically Signed   By: Rolla Flatten M.D.   On: 01/20/2014 19:19   Ct Abdomen Pelvis W Contrast  01/14/2014   CLINICAL DATA:  restaging stage IV cancerRestaging stage IV cancer  EXAM: CT CHEST, ABDOMEN AND PELVIS WITHOUT CONTRAST  TECHNIQUE: Multidetector CT imaging of the chest, abdomen and pelvis was performed following the standard protocol without IV contrast.  COMPARISON:  None.  MR CHEST MEDIAS WO/W CM dated 12/18/2013; NM PET IMAGE RESTAG (PS) SKULL BASE TO THIGH dated 03/13/2013; NM PET IMAGE RESTAG (PS) SKULL BASE TO THIGH dated 10/08/2013  FINDINGS: CT CHEST FINDINGS  The thoracic inlet is unremarkable.  A 6.1 mm lymph node is appreciated within the prevascular space. An 8.5 mm subcarinal lymph node is identified. A 10 mm left hilar  lymph node is appreciated image 22 series 2.  Postsurgical changes identified within the right axilla in the region of the previously described index nodule. There is mild increased soft tissue attenuation within the surgical bed which may represent postsurgical change. Findings are again consistent with a lumpectomy within the lateral aspect of the right breast. A left-sided port a catheter is identified with tip projecting in the region of the superior vena caval right atrial junction.  The an 11 mm lingular nodule is again identified. There is slight increased peripheral spiculation below though the size is unchanged. An ill-defined area of ground-glass density projects in the anterior base of the lingula image 23 series for which is increased and size and conspicuity measuring 8.8 mm. A small adjacent satellite nodule projects posteriorly measuring 4 mm. The 5 mm  nodule within the periphery of the lingula image 20 is slightly decreased in conspicuity measuring 4 mm. The spiculated nodule within the left lower lobe image 34 series 4 is unchanged measuring 12 x 9 mm.  Stable subpleural interstitial changes identified within the anterior aspect of the right upper lobe. A small 4.2 mm nodule was developed within the anterior aspect of the right upper lobe image 16 series 4. A 7.3 mm spiculated nodule projects anteriorly within the right middle lobe image 22 series 4. A 5.5 mm nodule is also identified posteriorly within the right middle lobe seen image. The pulmonary nodule in the anterior aspect of the right middle lobe has increased in size and extends to the subpleural region with greatest confluence measuring 17 x 9 mm image 32. When compared to the previous study there has been interval development of smaller less than 5 mm size pulmonary nodules within the base of the right middle lobe. The 7 mm pulmonary nodule posteriorly within the right lower lobe image 35 series 4 has increased in size now measuring 8 mm.  A 9 mm spiculated nodules appreciated centrally within the right upper lobe image 13 series 4.  No focal regions of consolidation are identified nor evidence of focal infiltrates. There is prominence of the interstitial markings within the right and left hemithoraces.  CT ABDOMEN AND PELVIS FINDINGS  Evaluation of the liver demonstrates low attenuating ill-defined nodules within the anterior dome of the right lobe of the liver as well as adjacent to the the junction of the right and left lobes of the liver image 45 series 2. A dominant nodules appreciated within the anterior central portion of the right lobe of the liver demonstrating heterogeneous enhancement and measuring 19 x 16 mm in AP by transverse dimensions, increased in size when correlated with dated 10/08/2013.  The spleen, adrenals, pancreas, kidneys are unremarkable.  There is no evidence of bowel obstruction, enteritis, colitis, diverticulitis, nor appendicitis.  There is no evidence of abdominal aortic aneurysm. The celiac, SMA, IMA, portal vein are opacified.  There is no evidence of abdominal free fluid, loculated fluid collections, nor masses. A 9 mm lymph node is appreciated along the gastrohepatic ligament. A 11 mm celiac plexus lymph node is identified. Image 47 series 2.  Evaluation of the pelvis demonstrates an oval shaped area of soft tissue attenuation in the posterior left adnexa measuring 3.8 x 1.9 cm in AP by transverse dimensions demonstrating Hounsfield units of 41.7. No further pelvic masses, free fluid, loculated fluid collections or adenopathy is appreciated.  Evaluation osseous structures demonstrates diffuse sclerotic density throughout the appendicular and axial skeleton. An expansile lesion is identified within the inferior pubic ramus on the right which may reflect sequela prior fracture likely pathologic or an expansile focus of metastatic disease.  IMPRESSION: 1. Progression of metastatic disease within the mediastinum and  lung parenchyma. There been increased size of the lung nodules and increased numbers. Interstitial findings within the lungs which may reflect the sequela of lymph and take infiltration. Chronic interstitial fibrotic change is of diagnostic consideration. 2. Interval increase  burden of metastatic disease within the liver. 3. Diffuse metastatic disease throughout the appendicular and axial skeleton 4. Findings concerning for a soft tissue mass within the pelvis on the left which may reflect a metastatic disease or possibly an ovarian origin. 5. Re evaluation with total body PET-CT is recommended.   Electronically Signed   By: Margaree Mackintosh M.D.   On: 01/14/2014 11:40  Scheduled Meds: . B-complex with vitamin C  1 tablet Oral Daily  . dexamethasone  4 mg Intravenous Q12H  . diazepam  2.5 mg Intravenous QHS,MR X 1  . feeding supplement (RESOURCE BREEZE)  1 Container Oral TID BM  . gabapentin  100 mg Oral BID  . levothyroxine  75 mcg Oral QAC breakfast  . multivitamin with minerals  1 tablet Oral Daily   Continuous Infusions: . sodium chloride 1,000 mL (01/24/14 2347)    Time spent: 25 minutes.    LOS: 13 days   [facility] Pager [phone]. If unable to reach me by pager, please call my cell phone at [phone].  *Please note that the hospitalists switch teams on Wednesdays. Please call the flow manager at [phone] if you are having difficulty reaching the hospitalist taking care of this patient as she can update you and provide the most up-to-date pager number of provider caring for the patient. If 8PM-8AM, please contact night-coverage at [email or web address], password [facility]  01/26/2014, 7:23 AM    **Disclaimer: This note was dictated with voice recognition software. Similar sounding words can inadvertently be transcribed and this note may contain transcription errors which may not have been corrected upon publication of note.**     In an effort to keep you and your  family informed about your hospital stay, I am providing you with this information sheet. If you or your family have any questions, please do not hesitate to have the nursing staff page me to set up a meeting time.  MERA Larsen 01/26/2014 13 (Number of days in the hospital)  Treatment team:  Dr. Jacquelynn Cree, Hospitalist (Internist)  Dr. Lurline Del (Oncology)  Dr. Arloa Koh (Radiation Oncology)  Dr. 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   Dr. Valere Dross does not feel further radiation treatment will be of any benefit as you already had whole brain radiation, and he feels spinal cord radiation will not result in regaining your ability to walk.  Dr. Jana Hakim feels that your cancer has entered a terminal stage, and he recommends hospice care.  I also spoke with Dr. Aline Brochure who is the oncologist on call at [facility].  He does not feel that transfer is warranted and recommends that [facility] as [facility] outpatient if you want a 2nd opinion.  The number to call is [phone].  You are too weak to return home without 24 hour care, so other options are to go to a rehab center or to hire private duty caregivers to remain in your home. Active Problems:  Anxiety Cancer is an extremely scary diagnosis, and patients with cancer often feel anxious about treatments and prognosis once treatment is no longer advisable.  We can help you with anxiety issues if this is a problem.  It is common for anxiety to manifest as distrust toward your treating physicians or anger.  We will continue to try to help you work through these issues. 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  You were treated with targeted therapy with Gilotrif by Dr. Julien Nordmann, but had progression of disease on this therapy so further treatment with this class of drugs is not recommended.  Dr. Jana Hakim does not feel there are any further treatment options.   Bone pain  This is from cancer invading the bones. Dexamethasone helps to reduce pain from cancer invading the bones.  Underactive thyroid  Continue synthroid 75 mcg daily.  Low blood counts  You received a blood transfusion during this hospital stay.   Malnutrition  We continue to encourage you to take in good nutrition as you are able.   Anticipated discharge date: You are medically stable to go to a rehab facility or home if you can arrange for 24 hour supervision.  If you opt for rehab, you will be discharged when a bed is available.

## 2014-01-27 NOTE — Progress Notes (Signed)
PROGRESS NOTE   DELOYCE WALTHERS Larsen:[id number] DOB: June 01, 1951 DOA: 01/13/2014 PCP: Melrose Nakayama, MD  Brief narrative: Debbie Larsen is an 63 y.o. female with past medical history of right breast adenocarcinoma diagnoses in 04/2001 s/p chemo and RT to right breast, high-grade metastatic carcinoma, unknown primary, bone and brain metastasis status post systemic chemo, was treated as breast cancer with Abraxane until 01/2013. Repeat PET scan on 03/13/2013 showed evidence for disease progression. Pt was admitted 01/13/2014 for progressive weakness, poor oral intake, weight loss. She has been started on Decadron on the admission but continues to have no strength in lower extremities. Her MRI lumbar and thoracic spine showed extensive metastatic disease with cord edema and per radiation oncolgy palliative radiotherapy will  not provide much improvement.   Assessment/Plan: Principal Problem:  Progressive generalized weakness, lower extremity weakness/ paralysis/ failure to thrive  Patient has high-grade metastatic carcinoma. 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  Her prognosis is extremely poor. No role for further radiation treatment per Dr. Valere Dross.  Her oncologist has repeatedly recommended hospice care, but is now refusing discharge to a residential hospice facility even though she does not have any local family support and cannot care for herself at home given her lower extremity paralysis.  She wants rehabilitation, although her paralysis cannot be reversed. She continues to request unrealistic treatment options such as bone marrow transplantation and other inappropriate therapies including immunotherapies.  Continue decadron.  Palliative care family meeting held with the patient and her brother and sister on 01/25/14. Patient continues to be in denial about her prognosis.  I contacted Dr. Aline Brochure (oncologist on call at [facility]) and he feels that the patient wants a second opinion,  she should followup at the breast clinic as an outpatient. Patient continues to be unable to move her lower extremities.  Awaiting rehab bed. Active Problems:  Paranoia Unclear if this is from her brain metastasis versus an underlying psychiatric disorder. She continues to be paranoid and critical of her treating physicians.  Evaluated by psychiatrist and found to have capacity. Metastatic carcinoma of undetermined primary with metastases to bones and brain  CT chest showed progression of metastatic disease within the mediastinum and lung parenchyma, increased size of the lung nodules and number of nodules, interval increase in liver metastatic disease, soft tissue mass within the pelvis on the left which could possibly reflect metastatic disease or possibly primary ovarian origin; per oncology primary possibly breast or lung. Unfortunately, there are no viable treatment options left for her. The palliative care team has been following her. Bone pain  Related to bone metastases. Per oncology recommendations patient was given one dose of Zometa on 01/16/2014. Continue dexamethasone, which has been increased by the palliative care team.  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  Hypothyroidism  Continue synthroid 75 mcg daily.  Anemia / Thrombocytopenia  Secondary to chronic disease due to malignancy and sequela of chemotherapy. Hemoglobin was 7.9 on 3/22 and she has received 1 unit of PRBC transfusion. Hemoglobin currently stable. Elevated liver enzymes  Likely due to metastatic disease.  Severe protein calorie malnutrition  Secondary to progressive malignancy. As mentioned above, we have encouraged by mouth intake. Patient refused to take feeding supplements.   Code Status: DNR/DNI  Family Communication: Brother at bedside 01/27/14. Disposition Plan: SNF.  Consultants:  Oncology (Dr. Julien Nordmann)  Rad oncology  Palliative care  Psychiatry  Procedures:  None   Antibiotics:    None  HPI/Subjective: [name] tells me she moved her bowels over night with magnesium citrate.  No significant pain.  Feels better sitting up in chair.  No dyspnea.    Objective: Filed Vitals:   01/26/14 0500 01/26/14 1353 01/26/14 2130 01/27/14 0758  BP: 132/78 120/72 115/70 123/64  Pulse: 85 88 91 79  Temp: 98.3 F (36.8 C) 97.4 F (36.3 C) 97.6 F (36.4 C) 97.9 F (36.6 C)  TempSrc: Oral Oral Oral Oral  Resp: 18 20 20 20   Height:      Weight:      SpO2: 100% 100% 100% 100%    Intake/Output Summary (Last 24 hours) at 01/27/14 1321 Last data filed at 01/27/14 1014  Gross per 24 hour  Intake    480 ml  Output   2503 ml  Net  -2023 ml    Exam: Gen:  NAD Cardiovascular:  RRR, No M/R/G Respiratory:  Lungs CTAB Gastrointestinal:  Abdomen soft, NT/ND, + BS Extremities:  No C/E/C  Data Reviewed: Basic Metabolic Panel:  Recent Labs Lab 01/21/14 0616 01/24/14 0546  NA 126* 133*  K 4.3 4.3  CL 90* 98  CO2 21 22  GLUCOSE 174* 136*  BUN 13 16  CREATININE 0.51 0.49*  CALCIUM 8.6 8.9   GFR Estimated Creatinine Clearance: 52.4 ml/min (by C-G formula based on Cr of 0.49).  CBC:  Recent Labs Lab 01/21/14 0616 01/24/14 0546  WBC 8.8 7.8  HGB 10.5* 10.0*  HCT 30.3* 29.6*  MCV 79.3 80.0  PLT 132* 184   Microbiology No results found for this or any previous visit (from the past 240 hour(s)).   Procedures and Diagnostic Studies: Ct Chest W Contrast  01/14/2014   CLINICAL DATA:  restaging stage IV cancerRestaging stage IV cancer  EXAM: CT CHEST, ABDOMEN AND PELVIS WITHOUT CONTRAST  TECHNIQUE: Multidetector CT imaging of the chest, abdomen and pelvis was performed following the standard protocol without IV contrast.  COMPARISON:  None.  MR CHEST MEDIAS WO/W CM dated 12/18/2013; NM PET IMAGE RESTAG (PS) SKULL BASE TO THIGH dated 03/13/2013; NM PET IMAGE RESTAG (PS) SKULL BASE TO THIGH dated 10/08/2013  FINDINGS: CT CHEST FINDINGS  The thoracic inlet is  unremarkable.  A 6.1 mm lymph node is appreciated within the prevascular space. An 8.5 mm subcarinal lymph node is identified. A 10 mm left hilar lymph node is appreciated image 22 series 2.  Postsurgical changes identified within the right axilla in the region of the previously described index nodule. There is mild increased soft tissue attenuation within the surgical bed which may represent postsurgical change. Findings are again consistent with a lumpectomy within the lateral aspect of the right breast. A left-sided port a catheter is identified with tip projecting in the region of the superior vena caval right atrial junction.  The an 11 mm lingular nodule is again identified. There is slight increased peripheral spiculation below though the size is unchanged. An ill-defined area of ground-glass density projects in the anterior base of the lingula image 23 series for which is increased and size and conspicuity measuring 8.8 mm. A small adjacent satellite nodule projects posteriorly measuring 4 mm. The 5 mm nodule within the periphery of the lingula image 20 is slightly decreased in conspicuity measuring 4 mm. The spiculated nodule within the left lower lobe image 34 series 4 is unchanged measuring 12 x 9 mm.  Stable subpleural interstitial changes identified within the anterior aspect of the right upper lobe. A small 4.2 mm  nodule was developed within the anterior aspect of the right upper lobe image 16 series 4. A 7.3 mm spiculated nodule projects anteriorly within the right middle lobe image 22 series 4. A 5.5 mm nodule is also identified posteriorly within the right middle lobe seen image. The pulmonary nodule in the anterior aspect of the right middle lobe has increased in size and extends to the subpleural region with greatest confluence measuring 17 x 9 mm image 32. When compared to the previous study there has been interval development of smaller less than 5 mm size pulmonary nodules within the base of  the right middle lobe. The 7 mm pulmonary nodule posteriorly within the right lower lobe image 35 series 4 has increased in size now measuring 8 mm. A 9 mm spiculated nodules appreciated centrally within the right upper lobe image 13 series 4.  No focal regions of consolidation are identified nor evidence of focal infiltrates. There is prominence of the interstitial markings within the right and left hemithoraces.  CT ABDOMEN AND PELVIS FINDINGS  Evaluation of the liver demonstrates low attenuating ill-defined nodules within the anterior dome of the right lobe of the liver as well as adjacent to the the junction of the right and left lobes of the liver image 45 series 2. A dominant nodules appreciated within the anterior central portion of the right lobe of the liver demonstrating heterogeneous enhancement and measuring 19 x 16 mm in AP by transverse dimensions, increased in size when correlated with dated 10/08/2013.  The spleen, adrenals, pancreas, kidneys are unremarkable.  There is no evidence of bowel obstruction, enteritis, colitis, diverticulitis, nor appendicitis.  There is no evidence of abdominal aortic aneurysm. The celiac, SMA, IMA, portal vein are opacified.  There is no evidence of abdominal free fluid, loculated fluid collections, nor masses. A 9 mm lymph node is appreciated along the gastrohepatic ligament. A 11 mm celiac plexus lymph node is identified. Image 47 series 2.  Evaluation of the pelvis demonstrates an oval shaped area of soft tissue attenuation in the posterior left adnexa measuring 3.8 x 1.9 cm in AP by transverse dimensions demonstrating Hounsfield units of 41.7. No further pelvic masses, free fluid, loculated fluid collections or adenopathy is appreciated.  Evaluation osseous structures demonstrates diffuse sclerotic density throughout the appendicular and axial skeleton. An expansile lesion is identified within the inferior pubic ramus on the right which may reflect sequela prior  fracture likely pathologic or an expansile focus of metastatic disease.  IMPRESSION: 1. Progression of metastatic disease within the mediastinum and lung parenchyma. There been increased size of the lung nodules and increased numbers. Interstitial findings within the lungs which may reflect the sequela of lymph and take infiltration. Chronic interstitial fibrotic change is of diagnostic consideration. 2. Interval increase  burden of metastatic disease within the liver. 3. Diffuse metastatic disease throughout the appendicular and axial skeleton 4. Findings concerning for a soft tissue mass within the pelvis on the left which may reflect a metastatic disease or possibly an ovarian origin. 5. Re evaluation with total body PET-CT is recommended.   Electronically Signed   By: Margaree Mackintosh M.D.   On: 01/14/2014 11:40   Mr Jeri Cos JQ Contrast  01/20/2014   CLINICAL DATA:  Breast cancer.  Evaluate for metastatic disease.  EXAM: MRI HEAD WITHOUT AND WITH CONTRAST  TECHNIQUE: Multiplanar, multiecho pulse sequences of the brain and surrounding structures were obtained without and with intravenous contrast.  CONTRAST:  MultiHance 9 mL.  COMPARISON:  MR HEAD WO/W CM dated 12/18/2013; MR C SPINE WO/W CM dated 02/21/2013  FINDINGS: No acute stroke or hemorrhage. No hydrocephalus or extra-axial fluid.  Intracranial metastatic disease is re-demonstrated, as was observed on the previous MR. Subcentimeter lesions are seen in the right cerebellar hemisphere, inferior vermis, right temporal lobe, left temporal lobe, and multiple lesions throughout the subcortical white matter of left greater than right cerebral hemispheres. The dominant lesion is an osseous metastasis to the greater wing of the sphenoid on the right, measures 17 x 19 mm, unchanged in size from priors. A dural based lesion is also seen over the right frontal convexity, similar to priors. No midline shift. No impending herniation. Moderate vasogenic edema is  associated with the right temporal lesion, increased from priors, and could predispose the patient to seizures. Other lesions also are roughly similar, including a spherical 7 mm lesion deep to the insula. There is slight increased edema surrounding the right cerebellar lesion compared with priors, but there is slightly less edema surrounding the left parietal subcortical lesion. A few more lesions are visible than were previously noted on the 12/18/2013 study, but there was considerable motion on that exam.  Marrow heterogeneity in the clivus could suggest additional tumor involvement. Low T1 signal intensity bone marrow in the upper cervical region unchanged from priors.  IMPRESSION: Suspected slight progression of metastatic disease, with a few more lesions visible than were observed on 12/18/2013, as well as increased vasogenic edema surrounding the right middle cranial fossa osseous lesion, and right cerebellar lesion. No impending herniation or midline shift.  No lesions involving the parasagittal posterior frontal cortex which might cause bilateral leg weakness.   Electronically Signed   By: Rolla Flatten M.D.   On: 01/20/2014 20:09   Mr Thoracic Spine W Contrast  01/21/2014   CLINICAL DATA:  63 year old female with stage IV breast cancer. New onset lower extremity weakness and unable to move the lower extremities. Initial encounter.  EXAM: MRI THORACIC SPINE WITH CONTRAST  TECHNIQUE: Multiplanar and multiecho pulse sequences of the thoracic spine were obtained with intravenous contrast.  CONTRAST:  54mL MULTIHANCE GADOBENATE DIMEGLUMINE 529 MG/ML IV SOLN  COMPARISON:  Lumbar MRI 01/20/2014.  FINDINGS: Diffusely abnormal bone marrow signal. Subsequently, cervical vertebral delineation is difficult on the sagittal scout view. The numbering system here agrees with that on the recent lumbar exam.  Diffuse thoracic spine and widespread posterior rib metastases. Still, no thoracic epidural or extraosseous tumor  identified.  Intra-axial partially exophytic solidly enhancing mass of the thoracic spinal cord centered at the T8 level measures 7 x 9 x 17 mm (AP by transverse by CC) and is associated with widespread thoracic spinal cord edema. Only a portion of the conus medullaris, and the ventral lower cervical cord is spared. Mild cord expansion suspected below the level of the tumor.  There is also a much smaller (2 mm) enhancing cord metastasis at the T2-T3 level just to the right of midline (series 10, image 8).  No definite cauda equina tumor. No abnormal dural thickening or enhancement.  Otherwise, bilateral lung nodules are suggested, and central right liver mass may have increased (series 7, image 39). See Chest abdomen pelvis CT 01/14/2014.  IMPRESSION: 1. 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 Associated spinal cord edema is widespread. 2. Diffuse osseous metastatic disease. Study discussed by telephone with Dr. [name] on 01/21/2014 at 15:29 .   Electronically Signed   By: Truman Hayward  [place] Crane M.D.   On: 01/21/2014 15:31   Mr Lumbar Spine W Wo Contrast  01/20/2014   CLINICAL DATA:  New onset of lower extremity weakness. Breast cancer.  EXAM: MRI LUMBAR SPINE WITHOUT AND WITH CONTRAST  TECHNIQUE: Multiplanar and multiecho pulse sequences of the lumbar spine were obtained without and with intravenous contrast.  CONTRAST:  74mL MULTIHANCE GADOBENATE DIMEGLUMINE 529 MG/ML IV SOLN  COMPARISON:  CT ABD/PELVIS W CM dated 01/14/2014; MR HEAD WO/W CM dated 01/20/2014; NM PET IMAGE RESTAG (PS) SKULL BASE TO THIGH dated 10/08/2013  FINDINGS: The scan extends from midportion T11 through the sacrum.  Widespread marrow heterogeneity reflecting osseous metastatic disease and post treatment effect. No areas of pathologic compression deformity are evident. There is no visible epidural tumor. The alignment is anatomic.  No pelvic masses.  Bladder decompressed with Foley catheter.  No disc protrusion  or spinal stenosis. Post infusion imaging demonstrates no abnormal intraspinal enhancement of the nerve roots or distal conus.  On sagittal T2 and STIR imaging, the conus appears abnormal, displaying intramedullary T2 bright signal and slight cord enlargement. An intramedullary metastasis above the field-of-view, at T10 or above, is suspected, with associated distal cord edema. There is no definite abnormal enhancement of the visualized thoracic cord or conus on post infusion imaging.  IMPRESSION: Suspected intramedullary metastasis above the field-of-view of lumbar spine MRI. The distal thoracic cord and conus appear to display T2 bright signal concerning for edema. Thoracic spine MRI without and with contrast recommended for further evaluation.  No evidence for spinal stenosis, dropped metastases to the cauda equina, or intraspinal mass lesion in the lumbar or sacral segments.  Widespread osseous disease without pathologic compression fracture or malalignment.   Electronically Signed   By: Rolla Flatten M.D.   On: 01/20/2014 19:19   Ct Abdomen Pelvis W Contrast  01/14/2014   CLINICAL DATA:  restaging stage IV cancerRestaging stage IV cancer  EXAM: CT CHEST, ABDOMEN AND PELVIS WITHOUT CONTRAST  TECHNIQUE: Multidetector CT imaging of the chest, abdomen and pelvis was performed following the standard protocol without IV contrast.  COMPARISON:  None.  MR CHEST MEDIAS WO/W CM dated 12/18/2013; NM PET IMAGE RESTAG (PS) SKULL BASE TO THIGH dated 03/13/2013; NM PET IMAGE RESTAG (PS) SKULL BASE TO THIGH dated 10/08/2013  FINDINGS: CT CHEST FINDINGS  The thoracic inlet is unremarkable.  A 6.1 mm lymph node is appreciated within the prevascular space. An 8.5 mm subcarinal lymph node is identified. A 10 mm left hilar lymph node is appreciated image 22 series 2.  Postsurgical changes identified within the right axilla in the region of the previously described index nodule. There is mild increased soft tissue attenuation within  the surgical bed which may represent postsurgical change. Findings are again consistent with a lumpectomy within the lateral aspect of the right breast. A left-sided port a catheter is identified with tip projecting in the region of the superior vena caval right atrial junction.  The an 11 mm lingular nodule is again identified. There is slight increased peripheral spiculation below though the size is unchanged. An ill-defined area of ground-glass density projects in the anterior base of the lingula image 23 series for which is increased and size and conspicuity measuring 8.8 mm. A small adjacent satellite nodule projects posteriorly measuring 4 mm. The 5 mm nodule within the periphery of the lingula image 20 is slightly decreased in conspicuity measuring 4 mm. The spiculated nodule within the left lower lobe image 34 series 4 is unchanged  measuring 12 x 9 mm.  Stable subpleural interstitial changes identified within the anterior aspect of the right upper lobe. A small 4.2 mm nodule was developed within the anterior aspect of the right upper lobe image 16 series 4. A 7.3 mm spiculated nodule projects anteriorly within the right middle lobe image 22 series 4. A 5.5 mm nodule is also identified posteriorly within the right middle lobe seen image. The pulmonary nodule in the anterior aspect of the right middle lobe has increased in size and extends to the subpleural region with greatest confluence measuring 17 x 9 mm image 32. When compared to the previous study there has been interval development of smaller less than 5 mm size pulmonary nodules within the base of the right middle lobe. The 7 mm pulmonary nodule posteriorly within the right lower lobe image 35 series 4 has increased in size now measuring 8 mm. A 9 mm spiculated nodules appreciated centrally within the right upper lobe image 13 series 4.  No focal regions of consolidation are identified nor evidence of focal infiltrates. There is prominence of the  interstitial markings within the right and left hemithoraces.  CT ABDOMEN AND PELVIS FINDINGS  Evaluation of the liver demonstrates low attenuating ill-defined nodules within the anterior dome of the right lobe of the liver as well as adjacent to the the junction of the right and left lobes of the liver image 45 series 2. A dominant nodules appreciated within the anterior central portion of the right lobe of the liver demonstrating heterogeneous enhancement and measuring 19 x 16 mm in AP by transverse dimensions, increased in size when correlated with dated 10/08/2013.  The spleen, adrenals, pancreas, kidneys are unremarkable.  There is no evidence of bowel obstruction, enteritis, colitis, diverticulitis, nor appendicitis.  There is no evidence of abdominal aortic aneurysm. The celiac, SMA, IMA, portal vein are opacified.  There is no evidence of abdominal free fluid, loculated fluid collections, nor masses. A 9 mm lymph node is appreciated along the gastrohepatic ligament. A 11 mm celiac plexus lymph node is identified. Image 47 series 2.  Evaluation of the pelvis demonstrates an oval shaped area of soft tissue attenuation in the posterior left adnexa measuring 3.8 x 1.9 cm in AP by transverse dimensions demonstrating Hounsfield units of 41.7. No further pelvic masses, free fluid, loculated fluid collections or adenopathy is appreciated.  Evaluation osseous structures demonstrates diffuse sclerotic density throughout the appendicular and axial skeleton. An expansile lesion is identified within the inferior pubic ramus on the right which may reflect sequela prior fracture likely pathologic or an expansile focus of metastatic disease.  IMPRESSION: 1. Progression of metastatic disease within the mediastinum and lung parenchyma. There been increased size of the lung nodules and increased numbers. Interstitial findings within the lungs which may reflect the sequela of lymph and take infiltration. Chronic interstitial  fibrotic change is of diagnostic consideration. 2. Interval increase  burden of metastatic disease within the liver. 3. Diffuse metastatic disease throughout the appendicular and axial skeleton 4. Findings concerning for a soft tissue mass within the pelvis on the left which may reflect a metastatic disease or possibly an ovarian origin. 5. Re evaluation with total body PET-CT is recommended.   Electronically Signed   By: Margaree Mackintosh M.D.   On: 01/14/2014 11:40    Scheduled Meds: . B-complex with vitamin C  1 tablet Oral Daily  . dexamethasone  4 mg Intravenous Q12H  . diazepam  2.5 mg Intravenous QHS,MR X 1  .  feeding supplement (RESOURCE BREEZE)  1 Container Oral TID BM  . gabapentin  100 mg Oral BID  . levothyroxine  75 mcg Oral QAC breakfast  . multivitamin with minerals  1 tablet Oral Daily   Continuous Infusions: . sodium chloride 50 mL/hr at 01/27/14 1310    Time spent: 25 minutes.    LOS: 14 days   [facility] Pager [phone]. If unable to reach me by pager, please call my cell phone at [phone].  *Please note that the hospitalists switch teams on Wednesdays. Please call the flow manager at [phone] if you are having difficulty reaching the hospitalist taking care of this patient as she can update you and provide the most up-to-date pager number of provider caring for the patient. If 8PM-8AM, please contact night-coverage at [email or web address], password [facility]  01/27/2014, 1:21 PM    **Disclaimer: This note was dictated with voice recognition software. Similar sounding words can inadvertently be transcribed and this note may contain transcription errors which may not have been corrected upon publication of note.**

## 2014-01-27 NOTE — Progress Notes (Signed)
Physical Therapy Treatment Patient Details Name: Debbie Larsen MRN: [id number] DOB: [birth year] Today's Date: 01/27/2014    History of Present Illness 63 y.o. female with past medical history of right breast adenocarcinoma diagnoses in 04/2001 s/p chemo and RT to right breast, high-grade metastatic carcinoma.. Pt was admitted 01/13/2014 for progressive weakness, poor oral intake, weight loss. . Pt now has completely flaccid LEs.    PT Comments    Pt in bed with RN in room.  Asssited pt to EOB to perform statis/dynamic sitting balance activities of cross midline reaching and forward/backward while supporting total to MAX suuort to prevent LOB in all directions.  Pt tolerated EOB sitting x 10 min.  Assisted pt from bed to recliner via stand pivot + 1 total assist "[employer]".  Positioned in recliner with multiple pillows.  Follow Up Recommendations  SNF     Equipment Recommendations       Recommendations for Other Services       Precautions / Restrictions Precautions Precautions: Fall Precaution Comments: new BLE weakness-no movement-paraplegia    Mobility  Bed Mobility Overal bed mobility: +2 for physical assistance;Needs Assistance Bed Mobility: Supine to Sit   Sidelying to sit: Max assist       General bed mobility comments: + 2 assist to transition from supine to EOB with 75% VC's to increase self assist with B UE's and use of rail.  Total assist to scoot to eob using pad.  Pt sat EOB x 10 min while performing static and dynamic sitting activities of cross midline reaching and forward/backward activities.     Transfers Overall transfer level: Needs assistance Equipment used: None Transfers: Stand Pivot Transfers   Stand pivot transfers: +2 physical assistance       General transfer comment: stand pivot sit "Bear Hug" 1/4 trun from bed to recliner pt unable to suuport self despite blocking knees.   Ambulation/Gait         Gait velocity: pt non amb        Stairs            Wheelchair Mobility    Modified Rankin (Stroke Patients Only)       Balance                                    Cognition                            Exercises      General Comments        Pertinent Vitals/Pain     Home Living                      Prior Function            PT Goals (current goals can now be found in the care plan section) Progress towards PT goals: Progressing toward goals    Frequency  Min 3X/week    PT Plan Current plan remains appropriate    End of Session Equipment Utilized During Treatment: Gait belt Activity Tolerance: Patient tolerated treatment well Patient left: with call bell/phone within reach;with nursing/sitter in room;in chair     Time: 9381-8299 PT Time Calculation (min): 31 min  Charges:  $Therapeutic Activity: 23-37 mins                    G  Codes:      Rica Koyanagi  PTA [facility]  Acute  Rehab Pager      [phone]

## 2014-01-27 NOTE — Progress Notes (Signed)
CSW continuing to follow for disposition planning.  CSW met with pt at bedside this morning. Pt was on the phone with social security at this time. Pt was notified that she does not qualify for SSI due to the amount of income she gets from [employer]. Per social security office, this cannot be changed therefore at this time she would not qualify for long term Medicaid.   CSW contacted [employer] [place] [employer] programmer, Nathaniel Man who recommended sending pt information to [facility] and ask facility to review pt information for difficult to place bed. CSW sent information and left message with [facility] admissions coordinator.   CSW awaiting response from [facility] to determine if facility can accept pt as difficult to place.  Pt brother updated at bedside as well.  CSW to continue to follow. Current barrier at this time is that pt does not have a payer for SNF and is unsafe to discharge home as pt does not have 24 hour care. Pt continues to be in denial about her prognosis and is not open to residential hospice placement at this time.  Alison Murray, MSW, [place] [employer] [phone]

## 2014-01-28 ENCOUNTER — Telehealth: Payer: Self-pay | Admitting: Neurology

## 2014-01-28 ENCOUNTER — Inpatient Hospital Stay
Admission: RE | Admit: 2014-01-28 | Discharge: 2014-01-29 | Disposition: A | Discharge status: Other Acute Inpatient Hospital | Payer: Medicaid Other | Source: Ambulatory Visit | Attending: Internal Medicine | Admitting: Internal Medicine

## 2014-01-28 ENCOUNTER — Other Ambulatory Visit: Payer: Self-pay | Admitting: Oncology

## 2014-01-28 DIAGNOSIS — Z515 Encounter for palliative care: Secondary | ICD-10-CM

## 2014-01-28 DIAGNOSIS — Z66 Do not resuscitate: Secondary | ICD-10-CM | POA: Diagnosis present

## 2014-01-28 MED ORDER — ALUM & MAG HYDROXIDE-SIMETH 200-200-20 MG/5ML PO SUSP: 15.0000 mL | ORAL | Status: AC | PRN

## 2014-01-28 MED ORDER — FLORANEX PO PACK
1.0000 g | PACK | Freq: Three times a day (TID) | ORAL | Status: DC
  Filled 2014-01-28 (×2): qty 1

## 2014-01-28 MED ORDER — GABAPENTIN 100 MG PO CAPS: 100.0000 mg | ORAL_CAPSULE | Freq: Two times a day (BID) | ORAL | Status: AC

## 2014-01-28 MED ORDER — MAGNESIUM CITRATE PO SOLN: 1.0000 | Freq: Every day | ORAL | Status: AC | PRN

## 2014-01-28 MED ORDER — HEPARIN SOD (PORK) LOCK FLUSH 100 UNIT/ML IV SOLN
500.0000 [IU] | Freq: Once | INTRAVENOUS | Status: AC
  Administered 2014-01-28: 500 [IU] via INTRAVENOUS
  Filled 2014-01-28: qty 5

## 2014-01-28 MED ORDER — BOOST / RESOURCE BREEZE PO LIQD: 1.0000 | Freq: Three times a day (TID) | ORAL | Status: AC

## 2014-01-28 MED ORDER — METHYLPREDNISOLONE ACETATE 40 MG/ML IJ SUSP: INTRAMUSCULAR | Status: DC

## 2014-01-28 MED ORDER — ACETAMINOPHEN 325 MG PO TABS: 650.0000 mg | ORAL_TABLET | Freq: Four times a day (QID) | ORAL | Status: AC | PRN

## 2014-01-28 MED ORDER — DIAZEPAM 5 MG/ML PO CONC: 5.0000 mg | Freq: Four times a day (QID) | ORAL | Status: DC | PRN

## 2014-01-28 MED ORDER — FLEET ENEMA 7-19 GM/118ML RE ENEM: 1.0000 | ENEMA | Freq: Every day | RECTAL | Status: AC | PRN

## 2014-01-28 MED ORDER — SENNOSIDES-DOCUSATE SODIUM 8.6-50 MG PO TABS: 2.0000 | ORAL_TABLET | Freq: Every evening | ORAL | Status: AC | PRN

## 2014-01-28 NOTE — Progress Notes (Signed)
CSW received notification from [place] Director, Nathaniel Man that [facility] can accept pt as difficult to place and bed available today.   CSW and MD met with pt at bedside to notify. Pt informed that she is medically ready for discharge and bed is available at [facility]. Pt expressed concern that this is not the right place for her and CSW explained that [facility] is the only option at this time unless pt chooses to discharge home. Pt states the she believes there are other options and that her neighbor whom is a Education officer, museum is helping her with these options. CSW encouraged continued help from pt neighbor, but explained that having pt Medicaid changed to long term Medicaid is going to be a process and that will have to be continued from [facility] as pt is medically stable for discharge from the hospital. MD confirmed that pt is medically stable.   CSW provided pt with contact information to [facility] and provided pt with [facility] Caseworker information as pt had requested information in order to contact Medicaid Caseworker to discuss options for getting long term Medicaid.  CSW offered to contact pt brother to update, but pt denied.  CSW to facilitate pt discharge needs this afternoon.  Alison Murray, MSW, [place] [employer] [phone]

## 2014-01-28 NOTE — Progress Notes (Signed)
Report called to Margie Billet, Therapist, sports at [facility] [phone]). No questions at this time.

## 2014-01-28 NOTE — Progress Notes (Signed)
NUTRITION FOLLOW UP  Intervention:   -Continue with Resource Breeze BID -Encouraged PO intake   Nutrition Dx:   Inadequate oral intake related to decreased appetite/loss of taste/nausea as evidenced by PO intake <75%, wt loss.    Goal:   Pt to meet >/= 90% of their estimated nutrition needs    Monitor:   Supplement tolerance, total protein/energy intake, labs, weights, GI profile  Assessment:   Debbie Larsen is a 63 y.o. 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 She denies any fever or chills, has mild nausea, denies vomiting   3/17: -Pt reported an unintentional wt loss of 15 lbs in past 2 months since beginning chemo and radiation treatments d/t decreased appetite, nausea and loss of taste  -Has been on liquid/soft food diet for past 10 days, has experienced 5-6 lbs weight loss  -Diet recall indicated pt consuming tea/milk for breakfast, and cream based soups or fruit smoothies for lunch/dinner.  -Has been trying to incorporate high protein foods into diet- kefir, greek yogurt, egg proteins. Contacted Patient Risk analyst for pt to receive milkshake once daily  -Pt dislikes Ensure/Boost supplements, was willing to try MagicCup  -Has had nausea and difficulty swallowing solid foods- noted difficulty with oatmeal getting stuck in throat  -Encouraged pt to use mint/lemon drops and cold foods to assist with taste changes  -Provided pt with handouts for other nutrition therapy recommendations. Reviewed full liquid diet options and possible ways to increase kcal and protein intake. Recommend use of Beneprotein with meals to increase food's  nutrient density  -Recommend pt be advanced to a mechanical soft diet as tolerated  -Pt last radiation treatment was 5 days ago   3/23: -Had provided pt with milkshakes daily, pt refusing as she reported being able to tolerate more solid food; however pt with minimal PO intake, <25% per RN documentation. -Dislikes MagicCup and Pro-Stat supplement. Will d/c -Enjoys [employer], will continue to recommend -Pt's brother bringing in foods from outside sources -Trialed organic MuscleMilk as pt/pt's family interested in trying Ensure alternative -Has been using lemon drops/mints to assist with taste changes -MD noted pt with progression of disease to bones and brain. Has also been encouraging pt's protein intake   3/26: -Followed up with patient and RN concerning tolerance of MuscleMilk Supplement. Pt reported disliking the taste as it induced nausea/gagging episode  -Has been trying to eat more, enjoying taste of Resource Breeze  -Pt's brother preparing drinks of honey/milk/tumeric, which pt slowly drinking throughout day  -Provided pt with [employer] sandwich with RN approval to promote appetite  -Pt very appreciative of nutrition visit; continued to encourage PO and supplement intake as tolerated  3/31: -Pt consumed 100% of [employer] sandwich per discussion with Nurse Tech on 3/27 -Family bringing in outside food sources -Pt refusing Resource Breeze supplement, drinks one daily. Will occasionally requesting hot tea with milk -MD noted pt with extremely poor prognosis; however pt refusing d/c to hospice facility. Palliative care following -PO intake 0-70% -Elevated LFTs -Received fleets enema on 3/30 -Weight increased 9 lbs in past two weeks, likely more r/t to hydration as pt with minimal PO intake   Height: Ht  Readings from Last 1 Encounters:  01/13/14 5' (1.524 m)    Weight Status:   Wt Readings from Last 1 Encounters:  01/28/14 114 lb 10.2 oz (52 kg)  01/14/14 105  lbs  Re-estimated needs:  Kcal: 1450-1650  Protein: 60-70 gram  Fluid: >/=1650 ml/dail  Skin: WDL  Diet Order: General   Intake/Output Summary (Last 24 hours) at 01/28/14 1405 Last data filed at 01/28/14 0900  Gross per 24 hour  Intake    240 ml  Output   4900 ml  Net  -4660 ml    Last BM: 3/30   Labs:   Recent Labs Lab 01/24/14 0546  NA 133*  K 4.3  CL 98  CO2 22  BUN 16  CREATININE 0.49*  CALCIUM 8.9  GLUCOSE 136*    CBG (last 3)  No results found for this basename: GLUCAP,  in the last 72 hours  Scheduled Meds: . B-complex with vitamin C  1 tablet Oral Daily  . dexamethasone  4 mg Intravenous Q12H  . diazepam  2.5 mg Intravenous QHS,MR X 1  . feeding supplement (RESOURCE BREEZE)  1 Container Oral TID BM  . gabapentin  100 mg Oral BID  . levothyroxine  75 mcg Oral QAC breakfast  . multivitamin with minerals  1 tablet Oral Daily    Continuous Infusions: . sodium chloride 50 mL/hr at 01/28/14 Allen RD LDN Clinical Dietitian [phone]

## 2014-01-28 NOTE — Progress Notes (Signed)
Pt for discharge to [facility].  CSW facilitated pt discharge needs including contacting facility, faxing pt discharge information via [facility], discussing with pt at bedside, providing RN phone number to call report, and arranging ambulance transport for pt to [facility]. (Service Request ID#: [id number]).  No further social work needs identified at this time.  CSW signing off.   Alison Murray, MSW, [place] [employer] [phone]

## 2014-01-28 NOTE — Discharge Summary (Addendum)
Physician Discharge Summary  Debbie Larsen IEP:[id number] DOB: 07/16/1951 DOA: 01/13/2014  PCP: Melrose Nakayama, MD  Admit date: 01/13/2014 Discharge date: 01/28/2014  Recommendations for Outpatient Follow-up:  1. Continue Depomedrol 40 mg IM Q 2 weeks, 1st dose 01/29/14. 2. Please make referral to [facility]. 3. The patient has F/U at the [facility] on 01/30/14 at 3:30 pm.  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  Diet recommendation: Regular.  History of present illness:  Debbie Larsen is an 63 y.o. female with past medical history of right breast adenocarcinoma diagnoses in 04/2001 s/p chemo and RT to right breast, high-grade metastatic carcinoma, unknown primary, bone and brain metastasis status post systemic chemo, was treated as breast cancer with Abraxane until 01/2013. Repeat PET scan on 03/13/2013 showed evidence for disease progression. Pt was admitted 01/13/2014 for progressive weakness, poor oral intake, weight loss. She has been started on Decadron on the admission but continues to have no strength in lower extremities. Her MRI lumbar and thoracic spine showed extensive metastatic disease with cord edema and per radiation oncolgy palliative radiotherapy will not provide much improvement.    Hospital Course by problem:  Principal Problem:  Progressive generalized weakness, lower extremity  weakness/ paralysis/ failure to thrive  Patient has high-grade metastatic carcinoma. 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 Her prognosis is extremely poor. No role for further radiation treatment per Dr. Valere Dross. Her oncologist has repeatedly recommended hospice care, but is now refusing discharge to a residential hospice facility even though she does not have any local family support and cannot care for herself at home given her lower extremity paralysis. She wants rehabilitation, although her paralysis cannot be reversed. She continues to request unrealistic treatment options such as bone marrow transplantation and other inappropriate therapies including immunotherapies.  Palliative care family meeting held with the patient and her brother and sister on 01/25/14. Patient continues to be in denial about her prognosis. I contacted Dr. Aline Brochure (oncologist on call at [facility]) and he feels that the patient wants a second opinion, she should followup at the breast clinic as an outpatient. Patient continues to be unable to move her lower extremities. Will D/C on depomedrol 40 mg IM Q 2 weeks.  Active Problems:  Paranoia  Unclear if this is from her brain metastasis versus an underlying psychiatric disorder. She continues to be paranoid and critical of her treating physicians. Evaluated by psychiatrist and found to have capacity.  Metastatic carcinoma of undetermined primary with metastases to bones and brain  CT chest showed progression of metastatic disease within the mediastinum and lung parenchyma, increased size of the lung nodules and number of nodules, interval increase in liver metastatic disease, soft tissue mass within the pelvis on the left which could possibly reflect metastatic disease or possibly primary ovarian origin; per oncology  primary possibly breast or lung. Unfortunately, there are no viable treatment options left for her. The palliative care team has been following her for  assistance with symptom control.  Bone pain  Related to bone metastases. Per oncology recommendations patient was given one dose of Zometa on 01/16/2014. 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  Hypothyroidism  Continue synthroid 75 mcg daily.  Anemia / Thrombocytopenia  Secondary to chronic disease due to malignancy and sequela of chemotherapy. Hemoglobin was 7.9 on 3/22 and she has received 1 unit of PRBC transfusion. Hemoglobin currently stable.  Elevated liver enzymes  Likely due to metastatic disease.  Severe protein calorie malnutrition  Secondary to progressive malignancy. As mentioned above, we have encouraged by mouth intake. Patient refused to take feeding supplements.   Procedures:  None.  Consultations:  Dr. Julien Nordmann and Dr. Jana Hakim, Oncology  Dr. Berniece Andreas, Psychiatry  Dr. Lynford Citizen, Physical Medicine and Rehabilitation  Dr. Valere Dross, Radiation Oncology  Dr. Rhea Pink, Palliative Care   Discharge Exam: Filed Vitals:   01/28/14 1410  BP: 133/63  Pulse: 98  Temp:   Resp: 16   Filed Vitals:   01/27/14 1556 01/27/14 2239 01/28/14 0633 01/28/14 1410  BP: 104/72 118/62 124/62 133/63  Pulse: 94 88 84 98  Temp: 98.1 F (36.7 C) 97.6 F (36.4 C) 98.7 F (37.1 C)   TempSrc: Oral Oral Oral Oral  Resp: 16 16 16 16   Height:      Weight:   52 kg (114 lb 10.2 oz)   SpO2: 100% 99% 100% 100%    Gen:  NAD Cardiovascular:  RRR, No M/R/G Respiratory: Lungs CTAB Gastrointestinal: Abdomen soft, NT/ND with normal active bowel sounds. Extremities: No C/E/C   Discharge Instructions      Discharge Orders   Future Appointments Provider Department Dept Phone   02/18/2014 10:40 AM Rexene Edison, MD [facility] [phone]   Future Orders Complete By Expires   Call MD for:  persistant nausea and vomiting  As directed    Call MD  for:  severe uncontrolled pain  As directed    Diet general  As directed    Increase activity slowly  As directed        Medication List    STOP taking these medications       afatinib dimaleate 30 MG tablet  Commonly known as:  GILOTRIF     dexamethasone 4 MG tablet  Commonly known as:  DECADRON      TAKE these medications       acetaminophen 325 MG tablet  Commonly known as:  TYLENOL  Take 2 tablets (650 mg total) by mouth every 6 (six) hours as needed for moderate pain, fever or headache.     alum & mag hydroxide-simeth 200-200-20 MG/5ML suspension  Commonly known as:  MAALOX/MYLANTA  Take 15 mLs by mouth every 4 (four) hours as needed for indigestion or heartburn.     B-complex with vitamin C tablet  Take 1 tablet by mouth daily.     diazepam 5 MG/ML solution  Commonly known as:  DIAZEPAM INTENSOL  Take 1 mL (5 mg total) by mouth every 6 (six) hours as needed for anxiety (or slepp).     feeding supplement (RESOURCE BREEZE) Liqd  Take 1 Container by mouth 3 (three) times daily between meals.     gabapentin 100 MG capsule  Commonly  known as:  NEURONTIN  Take 1 capsule (100 mg total) by mouth 2 (two) times daily.     levothyroxine 75 MCG tablet  Commonly known as:  SYNTHROID, LEVOTHROID  Take 1 tablet (75 mcg total) by mouth every evening.     lidocaine-prilocaine cream  Commonly known as:  EMLA  Apply topically as needed. Apply to port 1-2 hours before procedure     magnesium citrate Soln  Take 296 mLs (1 Bottle total) by mouth daily as needed for severe constipation.     methylPREDNISolone acetate 40 MG/ML injection  Commonly known as:  DEPO-MEDROL  Give 40 mg IM every 2 weeks, first dose 01/29/14.     multivitamin with minerals Tabs tablet  Take 1 tablet by mouth daily.     prochlorperazine 10 MG tablet  Commonly known as:  COMPAZINE  Take 1 tablet (10 mg total) by mouth every 6 (six) hours as needed for nausea or vomiting.     senna-docusate 8.6-50 MG  per tablet  Commonly known as:  Senokot-S  Take 2 tablets by mouth at bedtime as needed for mild constipation.     sodium phosphate 7-19 GM/118ML Enem  Place 1 enema rectally daily as needed for severe constipation.       Follow-up Information   Schedule an appointment as soon as possible for a visit with HAIRFORD, AMBER, MD. (As needed)    Specialty:  Family Medicine   Contact information:   [address] [place] [address] [phone]       Call [facility]. (If you want a second opinion.)        The results of significant diagnostics from this hospitalization (including imaging, microbiology, ancillary and laboratory) are listed below for reference.    Significant Diagnostic Studies: Ct Chest W Contrast  01/14/2014   CLINICAL DATA:  restaging stage IV cancerRestaging stage IV cancer  EXAM: CT CHEST, ABDOMEN AND PELVIS WITHOUT CONTRAST  TECHNIQUE: Multidetector CT imaging of the chest, abdomen and pelvis was performed following the standard protocol without IV contrast.  COMPARISON:  None.  MR CHEST MEDIAS WO/W CM dated 12/18/2013; NM PET IMAGE RESTAG (PS) SKULL BASE TO THIGH dated 03/13/2013; NM PET IMAGE RESTAG (PS) SKULL BASE TO THIGH dated 10/08/2013  FINDINGS: CT CHEST FINDINGS  The thoracic inlet is unremarkable.  A 6.1 mm lymph node is appreciated within the prevascular space. An 8.5 mm subcarinal lymph node is identified. A 10 mm left hilar lymph node is appreciated image 22 series 2.  Postsurgical changes identified within the right axilla in the region of the previously described index nodule. There is mild increased soft tissue attenuation within the surgical bed which may represent postsurgical change. Findings are again consistent with a lumpectomy within the lateral aspect of the right breast. A left-sided port a catheter is identified with tip projecting in the region of the superior vena caval right atrial junction.  The an 11 mm lingular nodule is  again identified. There is slight increased peripheral spiculation below though the size is unchanged. An ill-defined area of ground-glass density projects in the anterior base of the lingula image 23 series for which is increased and size and conspicuity measuring 8.8 mm. A small adjacent satellite nodule projects posteriorly measuring 4 mm. The 5 mm nodule within the periphery of the lingula image 20 is slightly decreased in conspicuity measuring 4 mm. The spiculated nodule within the left lower lobe image 34 series 4 is unchanged measuring 12  x 9 mm.  Stable subpleural interstitial changes identified within the anterior aspect of the right upper lobe. A small 4.2 mm nodule was developed within the anterior aspect of the right upper lobe image 16 series 4. A 7.3 mm spiculated nodule projects anteriorly within the right middle lobe image 22 series 4. A 5.5 mm nodule is also identified posteriorly within the right middle lobe seen image. The pulmonary nodule in the anterior aspect of the right middle lobe has increased in size and extends to the subpleural region with greatest confluence measuring 17 x 9 mm image 32. When compared to the previous study there has been interval development of smaller less than 5 mm size pulmonary nodules within the base of the right middle lobe. The 7 mm pulmonary nodule posteriorly within the right lower lobe image 35 series 4 has increased in size now measuring 8 mm. A 9 mm spiculated nodules appreciated centrally within the right upper lobe image 13 series 4.  No focal regions of consolidation are identified nor evidence of focal infiltrates. There is prominence of the interstitial markings within the right and left hemithoraces.  CT ABDOMEN AND PELVIS FINDINGS  Evaluation of the liver demonstrates low attenuating ill-defined nodules within the anterior dome of the right lobe of the liver as well as adjacent to the the junction of the right and left lobes of the liver image 45  series 2. A dominant nodules appreciated within the anterior central portion of the right lobe of the liver demonstrating heterogeneous enhancement and measuring 19 x 16 mm in AP by transverse dimensions, increased in size when correlated with dated 10/08/2013.  The spleen, adrenals, pancreas, kidneys are unremarkable.  There is no evidence of bowel obstruction, enteritis, colitis, diverticulitis, nor appendicitis.  There is no evidence of abdominal aortic aneurysm. The celiac, SMA, IMA, portal vein are opacified.  There is no evidence of abdominal free fluid, loculated fluid collections, nor masses. A 9 mm lymph node is appreciated along the gastrohepatic ligament. A 11 mm celiac plexus lymph node is identified. Image 47 series 2.  Evaluation of the pelvis demonstrates an oval shaped area of soft tissue attenuation in the posterior left adnexa measuring 3.8 x 1.9 cm in AP by transverse dimensions demonstrating Hounsfield units of 41.7. No further pelvic masses, free fluid, loculated fluid collections or adenopathy is appreciated.  Evaluation osseous structures demonstrates diffuse sclerotic density throughout the appendicular and axial skeleton. An expansile lesion is identified within the inferior pubic ramus on the right which may reflect sequela prior fracture likely pathologic or an expansile focus of metastatic disease.  IMPRESSION: 1. Progression of metastatic disease within the mediastinum and lung parenchyma. There been increased size of the lung nodules and increased numbers. Interstitial findings within the lungs which may reflect the sequela of lymph and take infiltration. Chronic interstitial fibrotic change is of diagnostic consideration. 2. Interval increase  burden of metastatic disease within the liver. 3. Diffuse metastatic disease throughout the appendicular and axial skeleton 4. Findings concerning for a soft tissue mass within the pelvis on the left which may reflect a metastatic disease or  possibly an ovarian origin. 5. Re evaluation with total body PET-CT is recommended.   Electronically Signed   By: Margaree Mackintosh M.D.   On: 01/14/2014 11:40   Mr Jeri Cos VZ Contrast  01/20/2014   CLINICAL DATA:  Breast cancer.  Evaluate for metastatic disease.  EXAM: MRI HEAD WITHOUT AND WITH CONTRAST  TECHNIQUE: Multiplanar, multiecho pulse sequences  of the brain and surrounding structures were obtained without and with intravenous contrast.  CONTRAST:  MultiHance 9 mL.  COMPARISON:  MR HEAD WO/W CM dated 12/18/2013; MR C SPINE WO/W CM dated 02/21/2013  FINDINGS: No acute stroke or hemorrhage. No hydrocephalus or extra-axial fluid.  Intracranial metastatic disease is re-demonstrated, as was observed on the previous MR. Subcentimeter lesions are seen in the right cerebellar hemisphere, inferior vermis, right temporal lobe, left temporal lobe, and multiple lesions throughout the subcortical white matter of left greater than right cerebral hemispheres. The dominant lesion is an osseous metastasis to the greater wing of the sphenoid on the right, measures 17 x 19 mm, unchanged in size from priors. A dural based lesion is also seen over the right frontal convexity, similar to priors. No midline shift. No impending herniation. Moderate vasogenic edema is associated with the right temporal lesion, increased from priors, and could predispose the patient to seizures. Other lesions also are roughly similar, including a spherical 7 mm lesion deep to the insula. There is slight increased edema surrounding the right cerebellar lesion compared with priors, but there is slightly less edema surrounding the left parietal subcortical lesion. A few more lesions are visible than were previously noted on the 12/18/2013 study, but there was considerable motion on that exam.  Marrow heterogeneity in the clivus could suggest additional tumor involvement. Low T1 signal intensity bone marrow in the upper cervical region unchanged from  priors.  IMPRESSION: Suspected slight progression of metastatic disease, with a few more lesions visible than were observed on 12/18/2013, as well as increased vasogenic edema surrounding the right middle cranial fossa osseous lesion, and right cerebellar lesion. No impending herniation or midline shift.  No lesions involving the parasagittal posterior frontal cortex which might cause bilateral leg weakness.   Electronically Signed   By: Rolla Flatten M.D.   On: 01/20/2014 20:09   Mr Thoracic Spine W Contrast  01/21/2014   CLINICAL DATA:  63 year old female with stage IV breast cancer. New onset lower extremity weakness and unable to move the lower extremities. Initial encounter.  EXAM: MRI THORACIC SPINE WITH CONTRAST  TECHNIQUE: Multiplanar and multiecho pulse sequences of the thoracic spine were obtained with intravenous contrast.  CONTRAST:  33mL MULTIHANCE GADOBENATE DIMEGLUMINE 529 MG/ML IV SOLN  COMPARISON:  Lumbar MRI 01/20/2014.  FINDINGS: Diffusely abnormal bone marrow signal. Subsequently, cervical vertebral delineation is difficult on the sagittal scout view. The numbering system here agrees with that on the recent lumbar exam.  Diffuse thoracic spine and widespread posterior rib metastases. Still, no thoracic epidural or extraosseous tumor identified.  Intra-axial partially exophytic solidly enhancing mass of the thoracic spinal cord centered at the T8 level measures 7 x 9 x 17 mm (AP by transverse by CC) and is associated with widespread thoracic spinal cord edema. Only a portion of the conus medullaris, and the ventral lower cervical cord is spared. Mild cord expansion suspected below the level of the tumor.  There is also a much smaller (2 mm) enhancing cord metastasis at the T2-T3 level just to the right of midline (series 10, image 8).  No definite cauda equina tumor. No abnormal dural thickening or enhancement.  Otherwise, bilateral lung nodules are suggested, and central right liver mass may  have increased (series 7, image 39). See Chest abdomen pelvis CT 01/14/2014.  IMPRESSION: 1. 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 Associated spinal cord edema is widespread. 2. Diffuse osseous metastatic disease.  Study discussed by telephone with Dr. [name] on 01/21/2014 at 15:29 .   Electronically Signed   By: Lars Pinks M.D.   On: 01/21/2014 15:31   Mr Lumbar Spine W Wo Contrast  01/20/2014   CLINICAL DATA:  New onset of lower extremity weakness. Breast cancer.  EXAM: MRI LUMBAR SPINE WITHOUT AND WITH CONTRAST  TECHNIQUE: Multiplanar and multiecho pulse sequences of the lumbar spine were obtained without and with intravenous contrast.  CONTRAST:  58mL MULTIHANCE GADOBENATE DIMEGLUMINE 529 MG/ML IV SOLN  COMPARISON:  CT ABD/PELVIS W CM dated 01/14/2014; MR HEAD WO/W CM dated 01/20/2014; NM PET IMAGE RESTAG (PS) SKULL BASE TO THIGH dated 10/08/2013  FINDINGS: The scan extends from midportion T11 through the sacrum.  Widespread marrow heterogeneity reflecting osseous metastatic disease and post treatment effect. No areas of pathologic compression deformity are evident. There is no visible epidural tumor. The alignment is anatomic.  No pelvic masses.  Bladder decompressed with Foley catheter.  No disc protrusion or spinal stenosis. Post infusion imaging demonstrates no abnormal intraspinal enhancement of the nerve roots or distal conus.  On sagittal T2 and STIR imaging, the conus appears abnormal, displaying intramedullary T2 bright signal and slight cord enlargement. An intramedullary metastasis above the field-of-view, at T10 or above, is suspected, with associated distal cord edema. There is no definite abnormal enhancement of the visualized thoracic cord or conus on post infusion imaging.  IMPRESSION: Suspected intramedullary metastasis above the field-of-view of lumbar spine MRI. The distal thoracic cord and conus appear to display T2 bright signal concerning for  edema. Thoracic spine MRI without and with contrast recommended for further evaluation.  No evidence for spinal stenosis, dropped metastases to the cauda equina, or intraspinal mass lesion in the lumbar or sacral segments.  Widespread osseous disease without pathologic compression fracture or malalignment.   Electronically Signed   By: Rolla Flatten M.D.   On: 01/20/2014 19:19   Ct Abdomen Pelvis W Contrast  01/14/2014   CLINICAL DATA:  restaging stage IV cancerRestaging stage IV cancer  EXAM: CT CHEST, ABDOMEN AND PELVIS WITHOUT CONTRAST  TECHNIQUE: Multidetector CT imaging of the chest, abdomen and pelvis was performed following the standard protocol without IV contrast.  COMPARISON:  None.  MR CHEST MEDIAS WO/W CM dated 12/18/2013; NM PET IMAGE RESTAG (PS) SKULL BASE TO THIGH dated 03/13/2013; NM PET IMAGE RESTAG (PS) SKULL BASE TO THIGH dated 10/08/2013  FINDINGS: CT CHEST FINDINGS  The thoracic inlet is unremarkable.  A 6.1 mm lymph node is appreciated within the prevascular space. An 8.5 mm subcarinal lymph node is identified. A 10 mm left hilar lymph node is appreciated image 22 series 2.  Postsurgical changes identified within the right axilla in the region of the previously described index nodule. There is mild increased soft tissue attenuation within the surgical bed which may represent postsurgical change. Findings are again consistent with a lumpectomy within the lateral aspect of the right breast. A left-sided port a catheter is identified with tip projecting in the region of the superior vena caval right atrial junction.  The an 11 mm lingular nodule is again identified. There is slight increased peripheral spiculation below though the size is unchanged. An ill-defined area of ground-glass density projects in the anterior base of the lingula image 23 series for which is increased and size and conspicuity measuring 8.8 mm. A small adjacent satellite nodule projects posteriorly measuring 4 mm. The 5 mm  nodule within the periphery of the lingula image 20  is slightly decreased in conspicuity measuring 4 mm. The spiculated nodule within the left lower lobe image 34 series 4 is unchanged measuring 12 x 9 mm.  Stable subpleural interstitial changes identified within the anterior aspect of the right upper lobe. A small 4.2 mm nodule was developed within the anterior aspect of the right upper lobe image 16 series 4. A 7.3 mm spiculated nodule projects anteriorly within the right middle lobe image 22 series 4. A 5.5 mm nodule is also identified posteriorly within the right middle lobe seen image. The pulmonary nodule in the anterior aspect of the right middle lobe has increased in size and extends to the subpleural region with greatest confluence measuring 17 x 9 mm image 32. When compared to the previous study there has been interval development of smaller less than 5 mm size pulmonary nodules within the base of the right middle lobe. The 7 mm pulmonary nodule posteriorly within the right lower lobe image 35 series 4 has increased in size now measuring 8 mm. A 9 mm spiculated nodules appreciated centrally within the right upper lobe image 13 series 4.  No focal regions of consolidation are identified nor evidence of focal infiltrates. There is prominence of the interstitial markings within the right and left hemithoraces.  CT ABDOMEN AND PELVIS FINDINGS  Evaluation of the liver demonstrates low attenuating ill-defined nodules within the anterior dome of the right lobe of the liver as well as adjacent to the the junction of the right and left lobes of the liver image 45 series 2. A dominant nodules appreciated within the anterior central portion of the right lobe of the liver demonstrating heterogeneous enhancement and measuring 19 x 16 mm in AP by transverse dimensions, increased in size when correlated with dated 10/08/2013.  The spleen, adrenals, pancreas, kidneys are unremarkable.  There is no evidence of bowel  obstruction, enteritis, colitis, diverticulitis, nor appendicitis.  There is no evidence of abdominal aortic aneurysm. The celiac, SMA, IMA, portal vein are opacified.  There is no evidence of abdominal free fluid, loculated fluid collections, nor masses. A 9 mm lymph node is appreciated along the gastrohepatic ligament. A 11 mm celiac plexus lymph node is identified. Image 47 series 2.  Evaluation of the pelvis demonstrates an oval shaped area of soft tissue attenuation in the posterior left adnexa measuring 3.8 x 1.9 cm in AP by transverse dimensions demonstrating Hounsfield units of 41.7. No further pelvic masses, free fluid, loculated fluid collections or adenopathy is appreciated.  Evaluation osseous structures demonstrates diffuse sclerotic density throughout the appendicular and axial skeleton. An expansile lesion is identified within the inferior pubic ramus on the right which may reflect sequela prior fracture likely pathologic or an expansile focus of metastatic disease.  IMPRESSION: 1. Progression of metastatic disease within the mediastinum and lung parenchyma. There been increased size of the lung nodules and increased numbers. Interstitial findings within the lungs which may reflect the sequela of lymph and take infiltration. Chronic interstitial fibrotic change is of diagnostic consideration. 2. Interval increase  burden of metastatic disease within the liver. 3. Diffuse metastatic disease throughout the appendicular and axial skeleton 4. Findings concerning for a soft tissue mass within the pelvis on the left which may reflect a metastatic disease or possibly an ovarian origin. 5. Re evaluation with total body PET-CT is recommended.   Electronically Signed   By: Margaree Mackintosh M.D.   On: 01/14/2014 11:40    Labs:  Basic Metabolic Panel:  Recent Labs Lab  01/24/14 0546  NA 133*  K 4.3  CL 98  CO2 22  GLUCOSE 136*  BUN 16  CREATININE 0.49*  CALCIUM 8.9   GFR Estimated Creatinine  Clearance: 52.4 ml/min (by C-G formula based on Cr of 0.49). Liver Function Tests:  Recent Labs Lab 01/24/14 0546  AST 66*  ALT 103*  ALKPHOS 221*  BILITOT 0.3  PROT 6.4  ALBUMIN 2.8*   CBC:  Recent Labs Lab 01/24/14 0546  WBC 7.8  HGB 10.0*  HCT 29.6*  MCV 80.0  PLT 184    Time coordinating discharge: 35 minutes.  Signed:  Jodine Muchmore  Pager [phone] [facility] 01/28/2014, 2:59 PM

## 2014-01-28 NOTE — Telephone Encounter (Signed)
Debbie Larsen with [facility] called.  She states that she is doing a retro-authorization for Medicaid for the MRI's on 12-18-2013. She states they need a plan of treatment and anything that would say why we sent the Pt there for the testing.  Please see notes from office visit dated 12-06-13.  Please call her at [phone] and fax information as necessary to [phone].  Thank you

## 2014-01-28 NOTE — Discharge Instructions (Signed)
Bone Metastases Cancerous growths can begin in any part of the body. The original site of cancer is called the primary tumor or primary cancer (for example, breast cancer). After cancer has developed in one area of the body, cancerous cells from that area can break away and travel through the body's bloodstream. If these cancerous cells begin growing in another place in the body, they are called metastases. Bone metastases are cancer cells that have spread to the bone (which is different from a cancer that starts in the bone). These secondary growths are like the original tumor. For example, if a prostate cancer spreads to bone it is called metastatic prostate cancer, or prostate cancer metastatic to bone, but not bone cancer. Cancers can spread to almost any bone; the spine and pelvis are often involved.  Any type of cancer can spread to the bone, but the most common are breast, lung, kidney, thyroid and prostate cancers. Sometimes the primary tumor is not discovered until there are bone problems. If the primary cancer location cannot be discovered, the cancer is called cancer of unknown primary location. SYMPTOMS  Pain in the bones is the main symptom of bone metastases. Some other problems may occur first including:  Decreased appetite.  Nausea.  Muscle weakness.  Confusion.  Unusual sleep patterns due to discomfort.  Overly tired (fatigue).  Restlessness. Frail or brittle bones may lead to broken bones (fractures) that lead to learning what is wrong (diagnosis). A tumor often weakens the bones.  DIAGNOSIS  Metastatic cancers may be found months or years after or at the same time as the primary tumor. When a second tumor is found in a patient who has been treated for cancer, it is more often a metastasis than another primary tumor.  The patient's symptoms, physical examination, X-rays and blood tests may suggest a bone metastases. In addition, an examination of tissue or a cell sample  (biopsy) is usually done to find the cancer. This sample is removed with a needle. This tissue sample must be looked at under a microscope to confirm a diagnosis. TREATMENT  Options generally include treatments that give relief from symptoms (palliative) or curative. Those with advanced, metastasized cancer may receive treatment focused on pain relief and prolonging life. These treatments depend on the type of cancer and its location.  Treatment for cancer depends on its type and location. Some of these treatments are:  Surgery to remove the original tumor and/or to remove parts of the body that produce hormones and other chemicals that make cancer worse.  Treatment with drugs (chemotherapy).  Bone marrow transplantations on rare occasions.  Radiation therapy (radiotherapy).  Hormonal therapy.  Pain relieving medications. Your caregiver will help you understand the likelihood that any particular treatment will be helpful for you. While some treatments aim to cure or control the cancer, others give relief from symptoms only. If you have bone metastases, radiation therapy may be recommended to treat pain (if it is in one main location). Pain medications are available. These include strong medicines like morphine. You may be instructed to take a long-acting pain medication (to control most of your pain) and a short-acting medication to control occasional flares of pain. Pain medication is sometimes also given continuously through a pump. HOME CARE INSTRUCTIONS   Take medications exactly as prescribed.  Keep any follow-up appointments.  Pain medications can make you sleepy or confused. Do not drive, climb ladders, or do other dangerous activities while on pain medication.  Pain medications often  cause constipation. Ask your caregiver for information on stool softeners.  Do not share your pain medication with others. SEEK MEDICAL CARE IF:   Your bone pain is not controlled.  You are having  problems or side effects from your medication.  You have excessive sleepiness or confusion. SEEK IMMEDIATE MEDICAL CARE IF:   You fall and have any injury or pain from the fall.  You have trouble walking.  You have numbness or tingling in your legs.  You develop a sudden significant worsening of your pain. Document Released: 10/07/2002 Document Revised: 01/09/2012 Document Reviewed: 05/30/2008 [facility] Patient Information 2014 [place].

## 2014-01-28 NOTE — Progress Notes (Signed)
Palliative Medicine Team Progress Note  "Debbie Larsen" continues to struggle with her prognosis and accepting that there are few if any treatment options for her available. She has refused Hospice Care-adamantly. I have however been working with her from a palliative standpoint and supporting her with offering options that ARE possible. Today she got a bed offer from [facility] in [place], she refused to be discharged until she spoke with me. I met with Debbie Larsen for over 45 minutes and provided reassurance and a clear discharge plan with attainable goals. She has now agreed to go with the following plan in place.  1. We have made her an appointment with Oncology at [facility]- she is asking for a second opinion and for further exploration of treatment options. She wants to get string enough to go for evaluation at [facility]. She thinks there may be a "treatment in the pipeline". She is scheduled for April 2nd at [facility].   2. I have offered depot injection Depo-Medrol- IV Steroids cannot be given at [facility]-and continued oral regimen.   3. Additionally I have changed her diazepam for sleep and agitation for SL diazepam 9m PRN.  4. She wants to work with PT and feels that she can walk again- I shared with her that I didn't think that would be possible but that she could work on ways to handle  Her current level of debility and immobility.  5. She also [facility] that I think would help her tremendously. Will ask center to place a referral.  She has agreed to be transported. She remains high risk for re-admission unfortunately.    3PM-4PM 60 minutes. Greater than 50%  of this time was spent counseling and coordinating care related to the above assessment and plan.  ELane Hacker DO Palliative Medicine

## 2014-01-29 ENCOUNTER — Inpatient Hospital Stay (HOSPITAL_COMMUNITY)
Admission: EM | Admit: 2014-01-29 | Discharge: 2014-02-03 | DRG: 871 | Disposition: A | Discharge status: Skilled Nursing Facility | Payer: Medicaid Other | Attending: Internal Medicine | Admitting: Internal Medicine

## 2014-01-29 ENCOUNTER — Encounter (HOSPITAL_COMMUNITY): Payer: Self-pay | Admitting: Emergency Medicine

## 2014-01-29 ENCOUNTER — Non-Acute Institutional Stay (SKILLED_NURSING_FACILITY): Payer: Medicaid Other | Admitting: Internal Medicine

## 2014-01-29 ENCOUNTER — Emergency Department (HOSPITAL_COMMUNITY): Payer: Medicaid Other

## 2014-01-29 DIAGNOSIS — Z8673 Personal history of transient ischemic attack (TIA), and cerebral infarction without residual deficits: Secondary | ICD-10-CM

## 2014-01-29 DIAGNOSIS — C7931 Secondary malignant neoplasm of brain: Secondary | ICD-10-CM | POA: Diagnosis present

## 2014-01-29 DIAGNOSIS — Z66 Do not resuscitate: Secondary | ICD-10-CM | POA: Diagnosis present

## 2014-01-29 DIAGNOSIS — D696 Thrombocytopenia, unspecified: Secondary | ICD-10-CM | POA: Diagnosis present

## 2014-01-29 DIAGNOSIS — D638 Anemia in other chronic diseases classified elsewhere: Secondary | ICD-10-CM | POA: Diagnosis present

## 2014-01-29 DIAGNOSIS — D649 Anemia, unspecified: Secondary | ICD-10-CM

## 2014-01-29 DIAGNOSIS — Z7401 Bed confinement status: Secondary | ICD-10-CM

## 2014-01-29 DIAGNOSIS — R7303 Prediabetes: Secondary | ICD-10-CM

## 2014-01-29 DIAGNOSIS — R42 Dizziness and giddiness: Secondary | ICD-10-CM

## 2014-01-29 DIAGNOSIS — A419 Sepsis, unspecified organism: Principal | ICD-10-CM | POA: Diagnosis present

## 2014-01-29 DIAGNOSIS — I69398 Other sequelae of cerebral infarction: Secondary | ICD-10-CM

## 2014-01-29 DIAGNOSIS — R509 Fever, unspecified: Secondary | ICD-10-CM

## 2014-01-29 DIAGNOSIS — C3490 Malignant neoplasm of unspecified part of unspecified bronchus or lung: Secondary | ICD-10-CM

## 2014-01-29 DIAGNOSIS — E559 Vitamin D deficiency, unspecified: Secondary | ICD-10-CM

## 2014-01-29 DIAGNOSIS — M25511 Pain in right shoulder: Secondary | ICD-10-CM

## 2014-01-29 DIAGNOSIS — Z833 Family history of diabetes mellitus: Secondary | ICD-10-CM

## 2014-01-29 DIAGNOSIS — C7949 Secondary malignant neoplasm of other parts of nervous system: Secondary | ICD-10-CM

## 2014-01-29 DIAGNOSIS — N390 Urinary tract infection, site not specified: Secondary | ICD-10-CM | POA: Diagnosis present

## 2014-01-29 DIAGNOSIS — C7951 Secondary malignant neoplasm of bone: Secondary | ICD-10-CM | POA: Diagnosis present

## 2014-01-29 DIAGNOSIS — R748 Abnormal levels of other serum enzymes: Secondary | ICD-10-CM

## 2014-01-29 DIAGNOSIS — Z853 Personal history of malignant neoplasm of breast: Secondary | ICD-10-CM

## 2014-01-29 DIAGNOSIS — Z79899 Other long term (current) drug therapy: Secondary | ICD-10-CM

## 2014-01-29 DIAGNOSIS — Z780 Asymptomatic menopausal state: Secondary | ICD-10-CM

## 2014-01-29 DIAGNOSIS — IMO0002 Reserved for concepts with insufficient information to code with codable children: Secondary | ICD-10-CM

## 2014-01-29 DIAGNOSIS — Z8249 Family history of ischemic heart disease and other diseases of the circulatory system: Secondary | ICD-10-CM

## 2014-01-29 DIAGNOSIS — E871 Hypo-osmolality and hyponatremia: Secondary | ICD-10-CM | POA: Diagnosis present

## 2014-01-29 DIAGNOSIS — E039 Hypothyroidism, unspecified: Secondary | ICD-10-CM | POA: Diagnosis present

## 2014-01-29 DIAGNOSIS — G8220 Paraplegia, unspecified: Secondary | ICD-10-CM | POA: Diagnosis present

## 2014-01-29 DIAGNOSIS — R7989 Other specified abnormal findings of blood chemistry: Secondary | ICD-10-CM | POA: Diagnosis present

## 2014-01-29 DIAGNOSIS — R7309 Other abnormal glucose: Secondary | ICD-10-CM | POA: Diagnosis present

## 2014-01-29 DIAGNOSIS — C801 Malignant (primary) neoplasm, unspecified: Secondary | ICD-10-CM | POA: Diagnosis present

## 2014-01-29 DIAGNOSIS — C7952 Secondary malignant neoplasm of bone marrow: Secondary | ICD-10-CM

## 2014-01-29 DIAGNOSIS — C787 Secondary malignant neoplasm of liver and intrahepatic bile duct: Secondary | ICD-10-CM

## 2014-01-29 DIAGNOSIS — Z923 Personal history of irradiation: Secondary | ICD-10-CM

## 2014-01-29 DIAGNOSIS — E876 Hypokalemia: Secondary | ICD-10-CM | POA: Diagnosis present

## 2014-01-29 DIAGNOSIS — C50919 Malignant neoplasm of unspecified site of unspecified female breast: Secondary | ICD-10-CM | POA: Diagnosis present

## 2014-01-29 DIAGNOSIS — I639 Cerebral infarction, unspecified: Secondary | ICD-10-CM

## 2014-01-29 DIAGNOSIS — R531 Weakness: Secondary | ICD-10-CM

## 2014-01-29 DIAGNOSIS — M8580 Other specified disorders of bone density and structure, unspecified site: Secondary | ICD-10-CM

## 2014-01-29 DIAGNOSIS — R5081 Fever presenting with conditions classified elsewhere: Secondary | ICD-10-CM

## 2014-01-29 DIAGNOSIS — F411 Generalized anxiety disorder: Secondary | ICD-10-CM

## 2014-01-29 DIAGNOSIS — Z9221 Personal history of antineoplastic chemotherapy: Secondary | ICD-10-CM

## 2014-01-29 DIAGNOSIS — E43 Unspecified severe protein-calorie malnutrition: Secondary | ICD-10-CM

## 2014-01-29 LAB — COMPREHENSIVE METABOLIC PANEL
ALBUMIN: 2.4 g/dL — AB (ref 3.5–5.2)
ALT: 137 U/L — ABNORMAL HIGH (ref 0–35)
AST: 76 U/L — ABNORMAL HIGH (ref 0–37)
Alkaline Phosphatase: 389 U/L — ABNORMAL HIGH (ref 39–117)
BILIRUBIN TOTAL: 0.7 mg/dL (ref 0.3–1.2)
BUN: 18 mg/dL (ref 6–23)
CHLORIDE: 83 meq/L — AB (ref 96–112)
CO2: 21 mEq/L (ref 19–32)
CREATININE: 0.62 mg/dL (ref 0.50–1.10)
Calcium: 8.4 mg/dL (ref 8.4–10.5)
GFR calc Af Amer: >90 mL/min (ref >90)
GFR calc non Af Amer: >90 mL/min (ref >90)
GLUCOSE: 258 mg/dL — AB (ref 70–99)
Potassium: 4.2 mEq/L (ref 3.7–5.3)
Sodium: 119 mEq/L — CL (ref 137–147)
TOTAL PROTEIN: 6.5 g/dL (ref 6.0–8.3)

## 2014-01-29 LAB — URINALYSIS, ROUTINE W REFLEX MICROSCOPIC
Bilirubin Urine: NEGATIVE
Glucose, UA: NEGATIVE mg/dL
KETONES UR: NEGATIVE mg/dL
NITRITE: POSITIVE — AB
PH: 6 (ref 5.0–8.0)
Protein, ur: NEGATIVE mg/dL
Specific Gravity, Urine: <1.005 — ABNORMAL LOW (ref 1.005–1.030)
UROBILINOGEN UA: 0.2 mg/dL (ref 0.0–1.0)

## 2014-01-29 LAB — CBC WITH DIFFERENTIAL/PLATELET
BASOS PCT: 0 % (ref 0–1)
Basophils Absolute: 0 10*3/uL (ref 0.0–0.1)
EOS ABS: 0 10*3/uL (ref 0.0–0.7)
EOS PCT: 0 % (ref 0–5)
HCT: 27.2 % — ABNORMAL LOW (ref 36.0–46.0)
Hemoglobin: 9.6 g/dL — ABNORMAL LOW (ref 12.0–15.0)
Lymphocytes Relative: 6 % — ABNORMAL LOW (ref 12–46)
Lymphs Abs: 0.8 10*3/uL (ref 0.7–4.0)
MCH: 28.1 pg (ref 26.0–34.0)
MCHC: 35.3 g/dL (ref 30.0–36.0)
MCV: 79.5 fL (ref 78.0–100.0)
Monocytes Absolute: 0.9 10*3/uL (ref 0.1–1.0)
Monocytes Relative: 7 % (ref 3–12)
Neutro Abs: 11.5 10*3/uL — ABNORMAL HIGH (ref 1.7–7.7)
Neutrophils Relative %: 87 % — ABNORMAL HIGH (ref 43–77)
PLATELETS: 112 10*3/uL — AB (ref 150–400)
RBC: 3.42 MIL/uL — ABNORMAL LOW (ref 3.87–5.11)
RDW: 15.4 % (ref 11.5–15.5)
WBC: 13.2 10*3/uL — ABNORMAL HIGH (ref 4.0–10.5)

## 2014-01-29 LAB — URINE MICROSCOPIC-ADD ON

## 2014-01-29 LAB — I-STAT CG4 LACTIC ACID, ED: Lactic Acid, Venous: 2.93 mmol/L — ABNORMAL HIGH (ref 0.5–2.2)

## 2014-01-29 MED ORDER — ONDANSETRON HCL 4 MG PO TABS: 4.0000 mg | ORAL_TABLET | Freq: Four times a day (QID) | ORAL | Status: DC | PRN

## 2014-01-29 MED ORDER — DEXTROSE 5 % IV SOLN
1.0000 g | Freq: Three times a day (TID) | INTRAVENOUS | Status: DC
  Administered 2014-01-30: 1 g via INTRAVENOUS
  Filled 2014-01-29 (×5): qty 1

## 2014-01-29 MED ORDER — MORPHINE SULFATE 2 MG/ML IJ SOLN: 1.0000 mg | INTRAMUSCULAR | Status: DC | PRN

## 2014-01-29 MED ORDER — GABAPENTIN 100 MG PO CAPS
100.0000 mg | ORAL_CAPSULE | Freq: Two times a day (BID) | ORAL | Status: DC
  Administered 2014-01-29 – 2014-02-03 (×10): 100 mg via ORAL
  Filled 2014-01-29 (×10): qty 1

## 2014-01-29 MED ORDER — MAGNESIUM CITRATE PO SOLN
1.0000 | Freq: Every day | ORAL | Status: DC | PRN
  Administered 2014-01-30: 1 via ORAL
  Filled 2014-01-29: qty 296

## 2014-01-29 MED ORDER — HYDROCORTISONE NA SUCCINATE PF 100 MG IJ SOLR
100.0000 mg | Freq: Four times a day (QID) | INTRAMUSCULAR | Status: DC
Start: 2014-01-29 — End: 2014-01-31
  Administered 2014-01-29 – 2014-01-31 (×7): 100 mg via INTRAVENOUS
  Filled 2014-01-29 (×8): qty 2

## 2014-01-29 MED ORDER — ONDANSETRON HCL 4 MG/2ML IJ SOLN
4.0000 mg | Freq: Four times a day (QID) | INTRAMUSCULAR | Status: DC | PRN
  Filled 2014-01-29: qty 2

## 2014-01-29 MED ORDER — SODIUM CHLORIDE 0.9 % IV SOLN
1000.0000 mL | Freq: Once | INTRAVENOUS | Status: AC
  Administered 2014-01-29: 1000 mL via INTRAVENOUS

## 2014-01-29 MED ORDER — SODIUM CHLORIDE 0.9 % IV SOLN
1000.0000 mL | INTRAVENOUS | Status: DC
  Administered 2014-01-29: 1000 mL via INTRAVENOUS

## 2014-01-29 MED ORDER — DOCUSATE SODIUM 100 MG PO CAPS
100.0000 mg | ORAL_CAPSULE | Freq: Two times a day (BID) | ORAL | Status: DC
  Administered 2014-01-30 – 2014-02-03 (×8): 100 mg via ORAL
  Filled 2014-01-29 (×9): qty 1

## 2014-01-29 MED ORDER — DEXTROSE 5 % IV SOLN
2.0000 g | Freq: Once | INTRAVENOUS | Status: AC
  Administered 2014-01-29: 2 g via INTRAVENOUS

## 2014-01-29 MED ORDER — SODIUM CHLORIDE 0.9 % IJ SOLN
3.0000 mL | Freq: Two times a day (BID) | INTRAMUSCULAR | Status: DC
  Administered 2014-01-29 – 2014-02-02 (×6): 3 mL via INTRAVENOUS

## 2014-01-29 MED ORDER — LEVOTHYROXINE SODIUM 75 MCG PO TABS
75.0000 ug | ORAL_TABLET | Freq: Every day | ORAL | Status: DC
  Administered 2014-01-30: 75 ug via ORAL
  Filled 2014-01-29 (×2): qty 1

## 2014-01-29 MED ORDER — ACETAMINOPHEN 650 MG RE SUPP: 650.0000 mg | Freq: Four times a day (QID) | RECTAL | Status: DC | PRN

## 2014-01-29 MED ORDER — SODIUM CHLORIDE 0.9 % IV SOLN
INTRAVENOUS | Status: DC
  Administered 2014-01-29 – 2014-01-31 (×5): via INTRAVENOUS

## 2014-01-29 MED ORDER — POLYETHYLENE GLYCOL 3350 17 G PO PACK
17.0000 g | PACK | Freq: Two times a day (BID) | ORAL | Status: DC
  Administered 2014-02-02: 17 g via ORAL
  Filled 2014-01-29 (×6): qty 1

## 2014-01-29 MED ORDER — BOOST / RESOURCE BREEZE PO LIQD
1.0000 | Freq: Three times a day (TID) | ORAL | Status: DC
  Administered 2014-01-30 – 2014-02-01 (×2): 1 via ORAL

## 2014-01-29 MED ORDER — DIAZEPAM 5 MG/ML PO CONC: 5.0000 mg | Freq: Four times a day (QID) | ORAL | Status: DC | PRN

## 2014-01-29 MED ORDER — ALBUTEROL SULFATE (2.5 MG/3ML) 0.083% IN NEBU: 2.5000 mg | INHALATION_SOLUTION | RESPIRATORY_TRACT | Status: DC | PRN

## 2014-01-29 MED ORDER — DEXTROSE 5 % IV SOLN
INTRAVENOUS | Status: AC
  Filled 2014-01-29: qty 1

## 2014-01-29 MED ORDER — ACETAMINOPHEN 325 MG PO TABS: 650.0000 mg | ORAL_TABLET | Freq: Four times a day (QID) | ORAL | Status: DC | PRN

## 2014-01-29 MED ORDER — HYDROCODONE-ACETAMINOPHEN 5-325 MG PO TABS: 1.0000 | ORAL_TABLET | ORAL | Status: DC | PRN

## 2014-01-29 NOTE — Telephone Encounter (Signed)
Pt's information was given to Butch Penny, RN Dr. Rhea Belton nurse.

## 2014-01-29 NOTE — H&P (Signed)
[facility] History and Physical  Debbie Larsen NFA:[id number] DOB: Mar 19, 1951 DOA: 01/29/2014   PCP: Melrose Nakayama, MD  Specialists: Patient has been seen by multiple consultants recently. She was hospitalized at [facility] and was discharged just yesterday, 3/31. She was seen by oncology, psychiatry, rehabilitation medicine, radiation oncology, and palliative care medicine.  Chief Complaint: Fever and chills  HPI: Debbie Larsen is a 64 y.o. 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 She has undergone chemotherapy and radiation in the past and is no longer considered a candidate for any further definitive treatment. Patient has been in denial about her terminal condition. She has refused hospice. She was finally discharged to a skilled nursing facility yesterday from [facility] long hospital. She was brought in today because of onset of fever. Patient feels some abdominal discomfort, but denies any nausea, vomiting, or diarrhea. She denies any pain per se. She does feel cold. She denies any cough or shortness of breath. She does have a chronic Foley ever since mid-March but she denies any discomfort in that area as well. Patient communicated to the ER doctor that she is waiting on a second opinion regarding her cancer. Physicians at [facility] long contacted oncology at [facility], but that second opinion is possible only on an outpatient basis. An appointment was subsequently made with oncology here at [facility], which is supposedly tomorrow.  Home Medications: Prior to Admission medications   Medication Sig Start Date End Date Taking? Authorizing Provider  acetaminophen (TYLENOL) 325 MG tablet Take 2 tablets (650 mg total) by mouth every 6 (six) hours as needed for moderate pain, fever or headache. 01/28/14  Yes Venetia Maxon Rama, MD  alum & mag hydroxide-simeth (MAALOX/MYLANTA) 200-200-20  MG/5ML suspension Take 15 mLs by mouth every 4 (four) hours as needed for indigestion or heartburn. 01/28/14  Yes Christina P Rama, MD  B Complex-C (B-COMPLEX WITH VITAMIN C) tablet Take 1 tablet by mouth daily.   Yes Historical Provider, MD  diazepam (DIAZEPAM INTENSOL) 5 MG/ML solution Take 1 mL (5 mg total) by mouth every 6 (six) hours as needed for anxiety (or slepp). 01/28/14  Yes Christina P Rama, MD  feeding supplement, RESOURCE BREEZE, (RESOURCE BREEZE) LIQD Take 1 Container by mouth 3 (three) times daily between meals. 01/28/14  Yes Christina P Rama, MD  gabapentin (NEURONTIN) 100 MG capsule Take 1 capsule (100 mg total) by mouth 2 (two) times daily. 01/28/14  Yes Venetia Maxon Rama, MD  levothyroxine (SYNTHROID, LEVOTHROID) 75 MCG tablet Take 1 tablet (75 mcg total) by mouth every evening. 11/22/13  Yes Andrena Mews, MD  lidocaine-prilocaine (EMLA) cream Apply topically as needed. Apply to port 1-2 hours before procedure 09/18/12  Yes Eston Esters, MD  magnesium citrate SOLN Take 296 mLs (1 Bottle total) by mouth daily as needed for severe constipation. 01/28/14  Yes Venetia Maxon Rama, MD  methylPREDNISolone acetate (DEPO-MEDROL) 40 MG/ML injection Give 40 mg IM every 2 weeks, first dose 01/29/14. 01/28/14  Yes Christina P Rama, MD  Multiple Vitamin (MULTIVITAMIN WITH MINERALS) TABS Take 1 tablet by mouth daily.   Yes Historical Provider, MD  prochlorperazine (COMPAZINE) 10 MG tablet Take 1 tablet (10 mg total) by mouth every 6 (six) hours as needed for nausea or vomiting. 12/31/13  Yes Rexene Edison, MD  senna-docusate (SENOKOT-S) 8.6-50 MG per tablet Take 2 tablets by mouth at bedtime as needed for mild constipation.  01/28/14  Yes Christina P Rama, MD  sodium phosphate (FLEET) 7-19 GM/118ML ENEM Place 1 enema rectally daily as needed for severe constipation. 01/28/14   Venetia Maxon Rama, MD    Allergies: No Known Allergies  Past Medical History: Past Medical History  Diagnosis Date  . Fibroadenoma  of breast, left 04/2001  . Hypothyroid   . Enlarged lymph nodes   . 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  . S/P radiation therapy     Right breast/regional lymph nodes/ 5040 cGy / 28 fractions/ bost of 1200 cGy/ 6 Fractions  . Status post chemotherapy     6 cycles of CMF completed on 12/12/2001  . Right shoulder pain     started late 2012  . Dry cough     intermittent  . SOB (shortness of breath)   . Use of letrozole (Femara) 03/14/2013  . Cholelithiasis   . Anemia   . Stroke   . BREAST CANCER 04/2001    s/p radiation therapy, chemotherapy and lumpectomy  . Metastasis to bone 03/13/13    PET scan results  . Metastasis to bone     breast primary  . Cancer     brain mets  . Lung cancer 10/08/2013  . 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 Tonsillectomy    . Breast lumpectomy  04/2001    excision of L breast fibroadenoma by Dr.   . Breast lumpectomy  05/16/2001    Right axillary sentinel lymph node biopsy.  . Portacath placement  09/19/2012    Procedure: INSERTION PORT-A-CATH;  Surgeon: Haywood Lasso, MD;  Location: [facility];  Service: General;  Laterality: N/A;  . Lipoma excision Right 05/15/2013    Procedure: RIGHT AXILLARY LYMPH NODE DISSECTION;  Surgeon: Haywood Lasso, MD;  Location: [facility];  Service: General;  Laterality: Right;    Social History: Currently from skilled nursing facility. She is paraplegic. No smoking, alcohol or illicit drug use.  Family History:  Family History  Problem Relation Age of Onset  . Diabetes Mother   . Hypertension Father   . Hypertension Sister   . Hypothyroidism Brother   . Cancer Neg Hx      Review of Systems - History obtained from the patient General ROS: positive for  - chills, fatigue and fever Psychological ROS: negative Ophthalmic ROS: negative ENT ROS:  negative Allergy and Immunology ROS: negative Hematological and Lymphatic ROS: negative Endocrine ROS: negative Respiratory ROS: no cough, shortness of breath, or wheezing Cardiovascular ROS: no chest pain or dyspnea on exertion Gastrointestinal ROS: positive for - constipation Genito-Urinary ROS: chronic foley Musculoskeletal ROS: negative Neurological ROS: weakness both legs Dermatological ROS: negative  Physical Examination  Filed Vitals:   01/29/14 1836 01/29/14 1842 01/29/14 2001 01/29/14 2103  BP: 93/54 98/62 82/52  110/80  Pulse: 102  90 89  Temp: 97.9 F (36.6 C)   97.7 F (36.5 C)  TempSrc: Oral   Oral  Resp: 18  23 18   Height: 5' (1.524 m)     Weight: 46.267 kg (102 lb)     SpO2: 99%  100% 100%    BP 110/80  Pulse 89  Temp(Src) 97.7 F (36.5 C) (Oral)  Resp 18  Ht 5' (1.524 m)  Wt 46.267 kg (102 lb)  BMI 19.92 kg/m2  SpO2 100%  General appearance:  alert, cooperative, appears stated age and no distress Head: Normocephalic, without obvious abnormality, atraumatic Eyes: conjunctivae/corneas clear. PERRL, EOM's intact.  Throat: dry mm Neck: no adenopathy, no carotid bruit, no JVD, supple, symmetrical, trachea midline and thyroid not enlarged, symmetric, no tenderness/mass/nodules Resp: Decreased breath sounds bilateral bases, without any crackles or wheezing Chest wall: Port-A-Cath noted on the left chest wall. No erythema is noted. Cardio: regular rate and rhythm, S1, S2 tachycardic, no murmur, click, rub or gallop GI: soft, non-tender; bowel sounds normal; no masses,  no organomegaly Extremities: extremities normal, atraumatic, no cyanosis or edema Pulses: 2+ and symmetric Skin: Skin color, texture, turgor normal. No rashes or lesions Lymph nodes: Cervical, supraclavicular, and axillary nodes normal. Neurologic: Alert and oriented x3. Cranial nerves are intact. Paraplegia is noted as before.  Laboratory Data: Results for orders placed during the hospital  encounter of 01/29/14 (from the past 48 hour(s))  CBC WITH DIFFERENTIAL     Status: Abnormal   Collection Time    01/29/14  7:39 PM      Result Value Ref Range   WBC 13.2 (*) 4.0 - 10.5 K/uL   RBC 3.42 (*) 3.87 - 5.11 MIL/uL   Hemoglobin 9.6 (*) 12.0 - 15.0 g/dL   HCT 27.2 (*) 36.0 - 46.0 %   MCV 79.5  78.0 - 100.0 fL   MCH 28.1  26.0 - 34.0 pg   MCHC 35.3  30.0 - 36.0 g/dL   RDW 15.4  11.5 - 15.5 %   Platelets 112 (*) 150 - 400 K/uL   Neutrophils Relative % 87 (*) 43 - 77 %   Neutro Abs 11.5 (*) 1.7 - 7.7 K/uL   Comment: CORRECTED ON 04/01 AT 2012: PREVIOUSLY REPORTED AS 11.4   Lymphocytes Relative 6 (*) 12 - 46 %   Lymphs Abs 0.8  0.7 - 4.0 K/uL   Monocytes Relative 7  3 - 12 %   Monocytes Absolute 0.9  0.1 - 1.0 K/uL   Eosinophils Relative 0  0 - 5 %   Eosinophils Absolute 0.0  0.0 - 0.7 K/uL   Basophils Relative 0  0 - 1 %   Basophils Absolute 0.0  0.0 - 0.1 K/uL   WBC Morphology TOXIC GRANULATION     Comment: VACUOLATED NEUTROPHILS     DOHLE BODIES     INCREASED BANDS (>20% BANDS)   RBC Morphology BASOPHILIC STIPPLING     Smear Review PLATELET COUNT CONFIRMED BY SMEAR    COMPREHENSIVE METABOLIC PANEL     Status: Abnormal   Collection Time    01/29/14  7:39 PM      Result Value Ref Range   Sodium 119 (*) 137 - 147 mEq/L   Comment: CRITICAL RESULT CALLED TO, READ BACK BY AND VERIFIED WITH:     BELTON K AT 2015 ON [id number] BY FORSYTH K   Potassium 4.2  3.7 - 5.3 mEq/L   Chloride 83 (*) 96 - 112 mEq/L   CO2 21  19 - 32 mEq/L   Glucose, Bld 258 (*) 70 - 99 mg/dL   BUN 18  6 - 23 mg/dL   Creatinine, Ser 0.62  0.50 - 1.10 mg/dL   Calcium 8.4  8.4 - 10.5 mg/dL   Total Protein 6.5  6.0 - 8.3 g/dL   Albumin 2.4 (*) 3.5 - 5.2 g/dL   AST 76 (*) 0 - 37 U/L   ALT 137 (*) 0 - 35 U/L   Alkaline Phosphatase 389 (*) 39 -  117 U/L   Total Bilirubin 0.7  0.3 - 1.2 mg/dL   GFR calc non Af Amer >90  >90 mL/min   GFR calc Af Amer >90  >90 mL/min   Comment: (NOTE)     The eGFR has  been calculated using the CKD EPI equation.     This calculation has not been validated in all clinical situations.     eGFR's persistently <90 mL/min signify possible Chronic Kidney     Disease.  I-STAT CG4 LACTIC ACID, ED     Status: Abnormal   Collection Time    01/29/14  7:40 PM      Result Value Ref Range   Lactic Acid, Venous 2.93 (*) 0.5 - 2.2 mmol/L  CULTURE, BLOOD (ROUTINE X 2)     Status: None   Collection Time    01/29/14  7:50 PM      Result Value Ref Range   Specimen Description BLOOD PORT     Special Requests       Value: BOTTLES DRAWN AEROBIC AND ANAEROBIC AEB 8CC ANA 4CC   Culture PENDING     Report Status PENDING    CULTURE, BLOOD (ROUTINE X 2)     Status: None   Collection Time    01/29/14  8:02 PM      Result Value Ref Range   Specimen Description BLOOD LEFT HAND     Special Requests BOTTLES DRAWN AEROBIC ONLY 5CC     Culture PENDING     Report Status PENDING    URINALYSIS, ROUTINE W REFLEX MICROSCOPIC     Status: Abnormal   Collection Time    01/29/14  8:04 PM      Result Value Ref Range   Color, Urine YELLOW  YELLOW   APPearance CLOUDY (*) CLEAR   Specific Gravity, Urine <1.005 (*) 1.005 - 1.030   pH 6.0  5.0 - 8.0   Glucose, UA NEGATIVE  NEGATIVE mg/dL   Hgb urine dipstick SMALL (*) NEGATIVE   Bilirubin Urine NEGATIVE  NEGATIVE   Ketones, ur NEGATIVE  NEGATIVE mg/dL   Protein, ur NEGATIVE  NEGATIVE mg/dL   Urobilinogen, UA 0.2  0.0 - 1.0 mg/dL   Nitrite POSITIVE (*) NEGATIVE   Leukocytes, UA LARGE (*) NEGATIVE  URINE MICROSCOPIC-ADD ON     Status: Abnormal   Collection Time    01/29/14  8:04 PM      Result Value Ref Range   Squamous Epithelial / LPF RARE  RARE   WBC, UA TOO NUMEROUS TO COUNT  <3 WBC/hpf   RBC / HPF 7-10  <3 RBC/hpf   Bacteria, UA MANY (*) RARE    Radiology Reports: Dg Chest Port 1 View  01/29/2014   CLINICAL DATA:  FEVER FEVER  EXAM: PORTABLE CHEST - 1 VIEW  COMPARISON:  MR T SPINE W/CM dated 01/21/2014; CT CHEST W/CM dated  01/14/2014; DG CHEST 1V PORT dated 09/19/2012  FINDINGS: The cardiac silhouette is enlarged. There are no focal infiltrates, effusions, nor edema. Stable prominence of the interstitial markings is appreciated. A left sided porta catheter is demonstrated via a subclavian approach with tip projecting in the region of the superior vena cava . No acute osseous abnormalities.  IMPRESSION: No evidence of acute cardiopulmonary disease. Chronic interstitial changes.   Electronically Signed   By: Margaree Mackintosh M.D.   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 She appears to have a urinary tract infection. She appears to be in sepsis with elevated lactic acid level. She has been on steroids for her spinal metastatic process and cord compression. She is in denial about her terminal cancer.  Plan: #1 urinary tract infection with sepsis: She'll be admitted to step down for tonight. She'll be given IV fluids. We will continue cefepime for now. Urine cultures will be followed up on. As she was on steroids recently and her BP is soft, we give her hydrocortisone for stress dosing. Foley catheter will be changed out as well. Blood cultures will be followed up on.  #2 hyponatremia: Sodium was 133 on March 27. This has been an acute drop. 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 Some of it could be due to sepsis as well. She'll be given IV fluids. Sodium will be repeated in the morning.  #3 widely metastatic cancer of unknown origin, with cord  compression and paraplegia: This is an unfortunate situation. Patient has been deemed not to be a candidate for any further treatment. However, she is in denial and has been requesting second opinions. She has an appointment with the oncologist here at [facility] tomorrow so we will consult while she is hospitalized. Patient's condition appears to be terminal. She was seen by palliative medicine team while she was at [facility] long and she agreed to a DO NOT RESUSCITATE status. This will be continued while she is here.  #4 abnormal liver function tests: Secondary to liver metastases.  #5 normocytic anemia: Likely secondary to cancer: Continue to monitor. Transfuse as needed.  #6 history of hypothyroidism: Continue with levothyroxine.  #7 abdominal discomfort: Patient reports a bowel movement about 3-4 days ago. Abdomen is benign. So, her discomfort is mainly from constipation. We will give her laxatives and monitor clinically.   DVT Prophylaxis: SCDs Code Status: DO NOT RESUSCITATE Family Communication: No family at bedside  Disposition Plan: Admit to step down   Further management decisions will depend on results of further testing and patient's response to treatment.   [facility]  [facility] Pager [phone]  If 7PM-7AM, please contact night-coverage [email or web address] Password [email or web address]  01/29/2014, 10:03 PM

## 2014-01-29 NOTE — ED Notes (Signed)
Pt comes via [facility] with c/o fever. Pt also reports pain in abdomen. Pt is A&O x4. Pt received tylenol pta.

## 2014-01-29 NOTE — Progress Notes (Signed)
Patient ID: Debbie Larsen, female   DOB: December 13, 1950, 63 y.o.   MRN: [id number]   This is an acute visit.  Level care skilled.  Facility [facility].  Chief complaint-acute visit secondary to fever of unknown origin.  History of present illness.  Patient is a middle-aged female with a complex medical history with metastatic breast cancer with bone and brain metastasis status post chemotherapy and radiation therapy-she was recently admitted here for rehabilitation with progressive generalized weakness with history of paralysis failure to thrive.  With severe protein calorie malnutrition.  This afternoon patient developed a significant temperature it appears of 103.1 along with tachycardia I am following up on this.  Patient states she does feel somewhat warm feels like she has a fever does not complain specifically of shortness of breath or dysuria and actually appears to be comfortable in her bed about to eat supper.  Her blood pressure is somewhat low at 98/58.  Family medical social history as been reviewed per discharge note on 01/28/2014.  Medications have been reviewed per MAR.  Review of systems.  In general says she does feel like she has a fever does not really complain of chills  Skin does not complain of any increased rashes or itching nursing staff has not noticed this.  Respiratory  is not complaining of shortness of breath or increased cough.  Cardiac is not complaining of chest pain.  GI is not complaining of abdominal discomfort at this time no nausea or vomiting noted by staff or diarrhea.  GU is not really complaining of dysuria--  Physical exam.  Temperature is 103.1 pulse 114 blood pressure 98/58 respirations 20.  In general this is a very frail middle-aged female in no distress lying comfortably in bed.  Her skin-- somewhat increased warmth to touch -- her face does appear to be somewhat flushed  Chest she does have some mild upper respiratory congestion  there is no labored breathing.  Heart is tachycardic with a rate of approximately 115.  GU she does have a Foley catheter draining amber colored urine do not really appreciate much suprapubic tenderness.  Her abdomen is protuberant soft not acutely tender to palpation --but does complain of some mild tenderness when her abdomen is palpated.  Muscle skeletal --very frail appearance with muscle wasting of her extremities   Labs.  01/24/2014.  Sodium 133 potassium 4.3 BUN 16 creatinine 0.49.  Albumin of 2.8-AST 66-ALT 103-alkaline phosphatase 221.  WBC 7.8 hemoglobin 10.0 platelets 184    Assessment and plan.  Fever of unknown origin-  New problem with significantly elevated temperature and tachycardia   would be suspicious of a septic process will send to the ER for expedient evaluation

## 2014-01-29 NOTE — ED Notes (Signed)
CRITICAL VALUE ALERT  Critical value received:  Na 119  Date of notification:  01/29/2014  Time of notification:  2013  Critical value read back:yes  Nurse who received alert:  Joellyn Rued, RN  MD notified (1st page):  Dr Stevie Kern  Time of first page:  2015  MD notified (2nd page):  Time of second page:  Responding MD:  Dr Stevie Kern  Time MD responded:  2015

## 2014-01-29 NOTE — ED Provider Notes (Signed)
CSN: [id number]     Arrival date & time 01/29/14  1823 History   First MD Initiated Contact with Patient 01/29/14 1856     Chief Complaint  Patient presents with  . Fever     (Consider location/radiation/quality/duration/timing/severity/associated sxs/prior Treatment) HPI 63 year old female with metastatic cancer of unknown primary who is no longer a candidate for radiation therapy and has metastases to her brain and spinal cord and bone; she has refused hospice care repeatedly; she is currently residing in a skilled nursing facility with paraplegia from her cancer; she still thinks that further cancer care will cure her her paraplegia and cancer. She now is sent to the emergency department due to fever over 102 prior to arrival with no change in mental status no headache no chest pain no cough no shortness of breath no abdominal pain no vomiting no diarrhea; she has had some intermittent abdominal pain over the last few days like she has had intermittently over the last few months from chronic constipation and has not had a bowel movement in the last few days but does not have abdominal pain now but did earlier today. She has an indwelling Foley catheter due to her paraplegia. There is no treatment prior to arrival. Past Medical History  Diagnosis Date  . 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 Hypothyroid   . Enlarged lymph nodes   . 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  . S/P radiation therapy     Right breast/regional lymph nodes/ 5040 cGy / 28 fractions/ bost of 1200 cGy/ 6 Fractions  . Status post chemotherapy     6 cycles of CMF completed on 12/12/2001  . Right shoulder pain     started late 2012  . Dry cough     intermittent  . SOB (shortness of breath)   . Use of letrozole (Femara) 03/14/2013  . Cholelithiasis   . Anemia   . Stroke   . BREAST CANCER 04/2001    s/p radiation therapy, chemotherapy and  lumpectomy  . Metastasis to bone 03/13/13    PET scan results  . Metastasis to bone     breast primary  . Cancer     brain mets  . Lung cancer 10/08/2013  . 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 Tonsillectomy    . Breast lumpectomy  04/2001    excision of L breast fibroadenoma by Dr.   . Breast lumpectomy  05/16/2001    Right axillary sentinel lymph node biopsy.  . Portacath placement  09/19/2012    Procedure: INSERTION PORT-A-CATH;  Surgeon: Haywood Lasso, MD;  Location: [facility];  Service: General;  Laterality: N/A;  . Lipoma excision Right 05/15/2013    Procedure: RIGHT AXILLARY LYMPH NODE DISSECTION;  Surgeon: Haywood Lasso, MD;  Location: [facility];  Service: General;  Laterality: Right;   Family History  Problem Relation Age of Onset  . Diabetes Mother   . Hypertension Father   . Hypertension Sister   . Hypothyroidism Brother   . Cancer Neg Hx    History  Substance Use Topics  . Smoking status: Never Smoker   . Smokeless tobacco: Never Used  . Alcohol Use: No   OB History   Grav Para Term Preterm Abortions TAB SAB Ect Mult Living   1 1 1  1     Obstetric Comments   G1, P1 Parity 28, Menopause with chemotherapy. No HRT use     Review of Systems 10 Systems reviewed and are negative for acute change except as noted in the HPI.   Allergies  Review of patient's allergies indicates no known allergies.  Home Medications   No current outpatient prescriptions on file. BP 118/89  Pulse 116  Temp(Src) 98.5 F (36.9 C) (Oral)  Resp 20  Ht 5' (1.524 m)  Wt 104 lb 15 oz (47.6 kg)  BMI 20.49 kg/m2  SpO2 99% Physical Exam  Nursing note and vitals reviewed. Constitutional:  Awake, alert, nontoxic but cachectic appearance.  HENT:  Head: Atraumatic.  Eyes: Right eye exhibits no discharge. Left eye exhibits no discharge.  Neck: Neck supple.  Cardiovascular: Regular rhythm.    No murmur heard. Mildly tachycardic  Pulmonary/Chest: Effort normal and breath sounds normal. No respiratory distress. She has no wheezes. She has no rales. She exhibits no tenderness.  Abdominal: Soft. Bowel sounds are normal. She exhibits no distension and no mass. There is no tenderness. There is no rebound and no guarding.  Musculoskeletal: She exhibits no edema and no tenderness.  Baseline ROM, no obvious new focal weakness. Paraplegic at baseline with numbness and paralysis from the waist distally. Capillary refill less than 2 seconds both feet.  Neurological: She is alert.  Mental status and motor strength appears baseline for patient and situation. Patient awake alert cooperative moves both arms well is paralyzed from the waist down.  Skin: No rash noted.  Psychiatric: She has a normal mood and affect.    ED Course  Procedures (including critical care time) Patient  informed of clinical course, understand medical decision-making process, and agree with plan.Pt stable in ED with no significant deterioration in condition.d/w [facility] for admit. Labs Review Labs Reviewed  CBC WITH DIFFERENTIAL - Abnormal; Notable for the following:    WBC 13.2 (*)    RBC 3.42 (*)    Hemoglobin 9.6 (*)    HCT 27.2 (*)    Platelets 112 (*)    Neutrophils Relative % 87 (*)    Neutro Abs 11.5 (*)    Lymphocytes Relative 6 (*)    All other components within normal limits  COMPREHENSIVE METABOLIC PANEL - Abnormal; Notable for the following:    Sodium 119 (*)    Chloride 83 (*)    Glucose, Bld 258 (*)    Albumin 2.4 (*)    AST 76 (*)    ALT 137 (*)    Alkaline Phosphatase 389 (*)    All other components within normal limits  URINALYSIS, ROUTINE W REFLEX MICROSCOPIC - Abnormal; Notable for the following:    APPearance CLOUDY (*)    Specific Gravity, Urine <1.005 (*)    Hgb urine dipstick SMALL (*)    Nitrite POSITIVE (*)    Leukocytes, UA LARGE (*)    All other components within normal limits   URINE MICROSCOPIC-ADD ON - Abnormal; Notable for the following:    Bacteria, UA MANY (*)    All other components within normal limits  COMPREHENSIVE METABOLIC PANEL - Abnormal; Notable for the following:    Sodium 129 (*)    Glucose, Bld 231 (*)    Calcium 7.5 (*)    Total Protein 5.7 (*)    Albumin 2.1 (*)    AST 72 (*)    ALT 116 (*)    Alkaline Phosphatase 329 (*)    All  other components within normal limits  CBC - Abnormal; Notable for the following:    WBC 14.1 (*)    RBC 3.01 (*)    Hemoglobin 8.4 (*)    HCT 24.1 (*)    RDW 15.7 (*)    Platelets 74 (*)    All other components within normal limits  GLUCOSE, CAPILLARY - Abnormal; Notable for the following:    Glucose-Capillary 191 (*)    All other components within normal limits  HEMOGLOBIN A1C - Abnormal; Notable for the following:    Hemoglobin A1C 6.5 (*)    Mean Plasma Glucose 140 (*)    All other components within normal limits  CBC - Abnormal; Notable for the following:    WBC 15.0 (*)    RBC 3.03 (*)    Hemoglobin 8.5 (*)    HCT 24.5 (*)    RDW 16.2 (*)    Platelets 70 (*)    All other components within normal limits  BASIC METABOLIC PANEL - Abnormal; Notable for the following:    Sodium 132 (*)    Glucose, Bld 170 (*)    Creatinine, Ser 0.38 (*)    Calcium 7.5 (*)    All other components within normal limits  GLUCOSE, CAPILLARY - Abnormal; Notable for the following:    Glucose-Capillary 202 (*)    All other components within normal limits  GLUCOSE, CAPILLARY - Abnormal; Notable for the following:    Glucose-Capillary 196 (*)    All other components within normal limits  GLUCOSE, CAPILLARY - Abnormal; Notable for the following:    Glucose-Capillary 158 (*)    All other components within normal limits  GLUCOSE, CAPILLARY - Abnormal; Notable for the following:    Glucose-Capillary 118 (*)    All other components within normal limits  I-STAT CG4 LACTIC ACID, ED - Abnormal; Notable for the following:     Lactic Acid, Venous 2.93 (*)    All other components within normal limits  CULTURE, BLOOD (ROUTINE X 2)  CULTURE, BLOOD (ROUTINE X 2)  URINE CULTURE  MRSA PCR SCREENING  LACTIC ACID, PLASMA   Imaging Review Dg Chest Port 1 View  01/29/2014   CLINICAL DATA:  FEVER FEVER  EXAM: PORTABLE CHEST - 1 VIEW  COMPARISON:  MR T SPINE W/CM dated 01/21/2014; CT CHEST W/CM dated 01/14/2014; DG CHEST 1V PORT dated 09/19/2012  FINDINGS: The cardiac silhouette is enlarged. There are no focal infiltrates, effusions, nor edema. Stable prominence of the interstitial markings is appreciated. A left sided porta catheter is demonstrated via a subclavian approach with tip projecting in the region of the superior vena cava . No acute osseous abnormalities.  IMPRESSION: No evidence of acute cardiopulmonary disease. Chronic interstitial changes.   Electronically Signed   By: Margaree Mackintosh M.D.   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    Babette Relic, MD 01/31/14 [phone]

## 2014-01-29 NOTE — ED Notes (Signed)
Here from [facility] for evaluation of fever

## 2014-01-29 NOTE — Progress Notes (Signed)
ANTIBIOTIC CONSULT NOTE  Pharmacy Consult for Cefepime Indication: rule out sepsis, Urosepsis  No Known Allergies  Patient Measurements: Height: 5' (152.4 cm) Weight: 102 lb (46.267 kg) IBW/kg (Calculated) : 45.5   Vital Signs: Temp: 97.7 F (36.5 C) (04/01 2103) Temp src: Oral (04/01 2103) BP: 110/80 mmHg (04/01 2103) Pulse Rate: 89 (04/01 2103) Intake/Output from previous day:   Intake/Output from this shift:    Labs:  Recent Labs  01/29/14 1939  WBC 13.2*  HGB 9.6*  PLT 112*  CREATININE 0.62   Estimated Creatinine Clearance: 52.4 ml/min (by C-G formula based on Cr of 0.62). No results found for this basename: VANCOTROUGH, VANCOPEAK, VANCORANDOM, [place], GENTPEAK, GENTRANDOM, TOBRATROUGH, TOBRAPEAK, TOBRARND, AMIKACINPEAK, AMIKACINTROU, AMIKACIN,  in the last 72 hours   Microbiology: Recent Results (from the past 720 hour(s))  CULTURE, BLOOD (ROUTINE X 2)     Status: None   Collection Time    01/29/14  7:50 PM      Result Value Ref Range Status   Specimen Description BLOOD PORT   Final   Special Requests     Final   Value: BOTTLES DRAWN AEROBIC AND ANAEROBIC AEB 8CC ANA 4CC   Culture PENDING   Incomplete   Report Status PENDING   Incomplete  CULTURE, BLOOD (ROUTINE X 2)     Status: None   Collection Time    01/29/14  8:02 PM      Result Value Ref Range Status   Specimen Description BLOOD LEFT HAND   Final   Special Requests BOTTLES DRAWN AEROBIC ONLY 5CC   Final   Culture PENDING   Incomplete   Report Status PENDING   Incomplete    Medical History: Past Medical History  Diagnosis Date  . 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 Hypothyroid   . Enlarged lymph nodes   . 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  . S/P radiation therapy     Right breast/regional lymph nodes/ 5040 cGy / 28 fractions/ bost of 1200 cGy/ 6 Fractions  . Status post chemotherapy     6 cycles of  CMF completed on 12/12/2001  . Right shoulder pain     started late 2012  . Dry cough     intermittent  . SOB (shortness of breath)   . Use of letrozole (Femara) 03/14/2013  . Cholelithiasis   . Anemia   . Stroke   . BREAST CANCER 04/2001    s/p radiation therapy, chemotherapy and lumpectomy  . Metastasis to bone 03/13/13    PET scan results  . Metastasis to bone     breast primary  . Cancer     brain mets  . Lung cancer 10/08/2013  . 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  Cefepime 4/1 >>  Goal of Therapy:  Eradicate infection.   Plan:  Cefepime 1gm IV every 8 hours. Follow-up micro data, labs, vitals.  Pricilla Larsson 01/29/2014,9:47 PM

## 2014-01-30 ENCOUNTER — Other Ambulatory Visit (HOSPITAL_COMMUNITY): Payer: Self-pay | Admitting: Hematology and Oncology

## 2014-01-30 ENCOUNTER — Ambulatory Visit (HOSPITAL_COMMUNITY): Payer: Medicaid Other

## 2014-01-30 ENCOUNTER — Other Ambulatory Visit (HOSPITAL_COMMUNITY): Payer: Self-pay | Admitting: Oncology

## 2014-01-30 ENCOUNTER — Other Ambulatory Visit: Payer: Self-pay | Admitting: Oncology

## 2014-01-30 DIAGNOSIS — C801 Malignant (primary) neoplasm, unspecified: Secondary | ICD-10-CM | POA: Diagnosis not present

## 2014-01-30 DIAGNOSIS — C7949 Secondary malignant neoplasm of other parts of nervous system: Secondary | ICD-10-CM

## 2014-01-30 DIAGNOSIS — C778 Secondary and unspecified malignant neoplasm of lymph nodes of multiple regions: Secondary | ICD-10-CM

## 2014-01-30 DIAGNOSIS — C7931 Secondary malignant neoplasm of brain: Secondary | ICD-10-CM

## 2014-01-30 DIAGNOSIS — C3490 Malignant neoplasm of unspecified part of unspecified bronchus or lung: Secondary | ICD-10-CM

## 2014-01-30 DIAGNOSIS — C7951 Secondary malignant neoplasm of bone: Secondary | ICD-10-CM | POA: Diagnosis not present

## 2014-01-30 DIAGNOSIS — D649 Anemia, unspecified: Secondary | ICD-10-CM

## 2014-01-30 DIAGNOSIS — C781 Secondary malignant neoplasm of mediastinum: Secondary | ICD-10-CM | POA: Diagnosis not present

## 2014-01-30 DIAGNOSIS — C7952 Secondary malignant neoplasm of bone marrow: Secondary | ICD-10-CM | POA: Diagnosis not present

## 2014-01-30 DIAGNOSIS — Z780 Asymptomatic menopausal state: Secondary | ICD-10-CM

## 2014-01-30 DIAGNOSIS — Z853 Personal history of malignant neoplasm of breast: Secondary | ICD-10-CM

## 2014-01-30 LAB — COMPREHENSIVE METABOLIC PANEL
ALT: 116 U/L — ABNORMAL HIGH (ref 0–35)
AST: 72 U/L — ABNORMAL HIGH (ref 0–37)
Albumin: 2.1 g/dL — ABNORMAL LOW (ref 3.5–5.2)
Alkaline Phosphatase: 329 U/L — ABNORMAL HIGH (ref 39–117)
BILIRUBIN TOTAL: 0.7 mg/dL (ref 0.3–1.2)
BUN: 16 mg/dL (ref 6–23)
CALCIUM: 7.5 mg/dL — AB (ref 8.4–10.5)
CO2: 21 mEq/L (ref 19–32)
Chloride: 97 mEq/L (ref 96–112)
Creatinine, Ser: 0.54 mg/dL (ref 0.50–1.10)
GFR calc Af Amer: >90 mL/min (ref >90)
GFR calc non Af Amer: >90 mL/min (ref >90)
Glucose, Bld: 231 mg/dL — ABNORMAL HIGH (ref 70–99)
Potassium: 3.9 mEq/L (ref 3.7–5.3)
Sodium: 129 mEq/L — ABNORMAL LOW (ref 137–147)
Total Protein: 5.7 g/dL — ABNORMAL LOW (ref 6.0–8.3)

## 2014-01-30 LAB — CBC
HCT: 24.1 % — ABNORMAL LOW (ref 36.0–46.0)
HEMOGLOBIN: 8.4 g/dL — AB (ref 12.0–15.0)
MCH: 27.9 pg (ref 26.0–34.0)
MCHC: 34.9 g/dL (ref 30.0–36.0)
MCV: 80.1 fL (ref 78.0–100.0)
PLATELETS: 74 10*3/uL — AB (ref 150–400)
RBC: 3.01 MIL/uL — AB (ref 3.87–5.11)
RDW: 15.7 % — ABNORMAL HIGH (ref 11.5–15.5)
WBC: 14.1 10*3/uL — AB (ref 4.0–10.5)

## 2014-01-30 LAB — GLUCOSE, CAPILLARY
GLUCOSE-CAPILLARY: 202 mg/dL — AB (ref 70–99)
Glucose-Capillary: 191 mg/dL — ABNORMAL HIGH (ref 70–99)
Glucose-Capillary: 196 mg/dL — ABNORMAL HIGH (ref 70–99)

## 2014-01-30 LAB — LACTIC ACID, PLASMA: Lactic Acid, Venous: 1.7 mmol/L (ref 0.5–2.2)

## 2014-01-30 LAB — MRSA PCR SCREENING: MRSA by PCR: NEGATIVE

## 2014-01-30 MED ORDER — DIAZEPAM 1 MG/ML PO SOLN
5.0000 mg | Freq: Four times a day (QID) | ORAL | Status: DC | PRN
  Administered 2014-01-30 – 2014-02-02 (×4): 5 mg via ORAL
  Filled 2014-01-30 (×4): qty 5

## 2014-01-30 MED ORDER — INSULIN ASPART 100 UNIT/ML ~~LOC~~ SOLN
0.0000 [IU] | Freq: Three times a day (TID) | SUBCUTANEOUS | Status: DC
  Administered 2014-01-30: 3 [IU] via SUBCUTANEOUS
  Administered 2014-01-30 – 2014-01-31 (×2): 2 [IU] via SUBCUTANEOUS
  Administered 2014-01-31: 3 [IU] via SUBCUTANEOUS
  Administered 2014-02-01 – 2014-02-03 (×3): 1 [IU] via SUBCUTANEOUS

## 2014-01-30 MED ORDER — PIPERACILLIN-TAZOBACTAM 3.375 G IVPB
3.3750 g | Freq: Three times a day (TID) | INTRAVENOUS | Status: DC
  Administered 2014-01-30 – 2014-02-03 (×12): 3.375 g via INTRAVENOUS
  Filled 2014-01-30 (×13): qty 50

## 2014-01-30 NOTE — Progress Notes (Unsigned)
Debbie Larsen is currently admitted at [facility] where she will be followed at least for the time being. I am dictating this chiefly to document her brother Samir's phone number, which is [phone].

## 2014-01-30 NOTE — Progress Notes (Signed)
ANTIBIOTIC CONSULT NOTE - Initial  Pharmacy Consult for Zosyn Indication: rule out sepsis, Urosepsis  No Known Allergies  Patient Measurements: Height: 5' (152.4 cm) Weight: 104 lb 15 oz (47.6 kg) IBW/kg (Calculated) : 45.5   Vital Signs: Temp: 99.1 F (37.3 C) (04/02 1200) Temp src: Oral (04/02 1200) BP: 115/63 mmHg (04/02 1200) Intake/Output from previous day: 04/01 0701 - 04/02 0700 In: 1097.9 [I.V.:1047.9; IV Piggyback:50] Out: 2300 [Urine:2300] Intake/Output from this shift: Total I/O In: 1108 [P.O.:480; I.V.:628] Out: 1900 [Urine:1900]  Labs:  Recent Labs  01/29/14 1939 01/30/14 0440  WBC 13.2* 14.1*  HGB 9.6* 8.4*  PLT 112* 74*  CREATININE 0.62 0.54   Estimated Creatinine Clearance: 52.4 ml/min (by C-G formula based on Cr of 0.54). No results found for this basename: VANCOTROUGH, Corlis Leak, VANCORANDOM, GENTTROUGH, GENTPEAK, GENTRANDOM, TOBRATROUGH, TOBRAPEAK, TOBRARND, AMIKACINPEAK, AMIKACINTROU, AMIKACIN,  in the last 72 hours   Microbiology: Recent Results (from the past 720 hour(s))  CULTURE, BLOOD (ROUTINE X 2)     Status: None   Collection Time    01/29/14  7:50 PM      Result Value Ref Range Status   Specimen Description BLOOD PORTA CATH DRAWN BY RN KB   Final   Special Requests     Final   Value: BOTTLES DRAWN AEROBIC AND ANAEROBIC AEB=8CC ANA=4CC   Culture     Final   Value: GRAM NEGATIVE RODS     Gram Stain Report Called to,Read Back By and Verified With: BOBLEE RN ON [id number] AT 0850 BY RESSEGGER R     Performed at [facility]   Report Status PENDING   Incomplete  CULTURE, BLOOD (ROUTINE X 2)     Status: None   Collection Time    01/29/14  8:02 PM      Result Value Ref Range Status   Specimen Description BLOOD LEFT HAND   Final   Special Requests BOTTLES DRAWN AEROBIC ONLY 5CC   Final   Culture     Final   Value: GRAM NEGATIVE RODS Gram Stain Report Called to,Read Back By and Verified With: BOB LEE RN ON [id number] AT [place] BY RESSEGGER  R     Performed at [facility]   Report Status PENDING   Incomplete  MRSA PCR SCREENING     Status: None   Collection Time    01/29/14 10:28 PM      Result Value Ref Range Status   MRSA by PCR NEGATIVE  NEGATIVE Final   Comment:            The GeneXpert MRSA Assay (FDA     approved for NASAL specimens     only), is one component of a     comprehensive MRSA colonization     surveillance program. It is not     intended to diagnose MRSA     infection nor to guide or     monitor treatment for     MRSA infections.    Medical History: Past Medical History  Diagnosis Date  . 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 Hypothyroid   . Enlarged lymph nodes   . 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  . S/P radiation therapy     Right breast/regional lymph nodes/ 5040 cGy / 28 fractions/ bost of 1200 cGy/ 6 Fractions  . Status post chemotherapy     6 cycles of CMF completed  on 12/12/2001  . Right shoulder pain     started late 2012  . Dry cough     intermittent  . SOB (shortness of breath)   . Use of letrozole (Femara) 03/14/2013  . Cholelithiasis   . Anemia   . Stroke   . BREAST CANCER 04/2001    s/p radiation therapy, chemotherapy and lumpectomy  . Metastasis to bone 03/13/13    PET scan results  . Metastasis to bone     breast primary  . Cancer     brain mets  . Lung cancer 10/08/2013  . 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 acetaminophen (TYLENOL) 325 MG tablet Take 2 tablets (650 mg total) by mouth every 6 (six) hours as needed for moderate pain, fever or headache.      Marland Kitchen alum & mag hydroxide-simeth (MAALOX/MYLANTA) 200-200-20 MG/5ML suspension Take 15 mLs by mouth every 4 (four) hours as needed for indigestion or heartburn.  355 mL  0  . B Complex-C (B-COMPLEX WITH VITAMIN C) tablet  Take 1 tablet by mouth daily.      . diazepam (DIAZEPAM INTENSOL) 5 MG/ML solution Take 1 mL (5 mg total) by mouth every 6 (six) hours as needed for anxiety (or slepp).  30 mL  0  . feeding supplement, RESOURCE BREEZE, (RESOURCE BREEZE) LIQD Take 1 Container by mouth 3 (three) times daily between meals.    0  . gabapentin (NEURONTIN) 100 MG capsule Take 1 capsule (100 mg total) by mouth 2 (two) times daily.      Marland Kitchen levothyroxine (SYNTHROID, LEVOTHROID) 75 MCG tablet Take 1 tablet (75 mcg total) by mouth every evening.  30 tablet  3  . lidocaine-prilocaine (EMLA) cream Apply topically as needed. Apply to port 1-2 hours before procedure  30 g  0  . magnesium citrate SOLN Take 296 mLs (1 Bottle total) by mouth daily as needed for severe constipation.  195 mL    . methylPREDNISolone acetate (DEPO-MEDROL) 40 MG/ML injection Give 40 mg IM every 2 weeks, first dose 01/29/14.  1 mL    . Multiple Vitamin (MULTIVITAMIN WITH MINERALS) TABS Take 1 tablet by mouth daily.      . prochlorperazine (COMPAZINE) 10 MG tablet Take 1 tablet (10 mg total) by mouth every 6 (six) hours as needed for nausea or vomiting.  30 tablet  0  . senna-docusate (SENOKOT-S) 8.6-50 MG per tablet Take 2 tablets by mouth at bedtime as needed for mild constipation.      . sodium phosphate (FLEET) 7-19 GM/118ML ENEM Place 1 enema rectally daily as needed for severe constipation.    0   Scheduled:  . docusate sodium  100 mg Oral BID  . feeding supplement (RESOURCE BREEZE)  1 Container Oral TID BM  . gabapentin  100 mg Oral BID  . hydrocortisone sod succinate (SOLU-CORTEF) inj  100 mg Intravenous Q6H  . insulin aspart  0-9 Units Subcutaneous TID WC  . levothyroxine  75 mcg Oral QAC breakfast  . piperacillin-tazobactam (ZOSYN)  IV  3.375 g Intravenous Q8H  . polyethylene glycol  17 g Oral BID  . sodium chloride  3 mL Intravenous Q12H   Assessment: Culture - gram negative rods Cefepime consult discontinued Zosyn started  Zosyn 4/2  >  Goal of Therapy:  Eradicate infection.   Plan:  Zosyn 3.375 GM IV every 8 hours, infuse each dose over  4 hours Follow-up micro data, labs, vitals.  Abner Greenspan, Mitcheal Sweetin Kennan 01/30/2014,1:04 PM

## 2014-01-30 NOTE — Progress Notes (Signed)
DR Doyle Askew NOTIFIED OF BLOOD CULTURES X2 SHOWING GRAM (-) RODS.  SHE HAS ALSO ASKED TO REVIEW NOTE FROM MARIE BYRD RN DC CONCERNING GLUCOSE LEVELS. PT IS ON CHRONIC STEROID TX

## 2014-01-30 NOTE — Clinical Social Work Psychosocial (Signed)
Clinical Social Work Department BRIEF PSYCHOSOCIAL ASSESSMENT 01/30/2014  [name], [name]     Account Number:  [id number]     Admit date:  01/29/2014  Clinical Social Worker:  Edwyna Shell, [place]  Date/Time:  01/30/2014 03:00 PM  Referred by:  CSW  Date Referred:  01/30/2014 Referred for  SNF Placement   Other Referral:   Interview type:  Patient Other interview type:    PSYCHOSOCIAL DATA Living Status:  FACILITY Admitted from facility:  [facility] Level of care:  [place] Primary support name:  Andi Hence Primary support relationship to patient:  NEIGHBOR Degree of support available:   Limited family support - son in [place] and siblings in [place].  Has supportive neighbors who visit regularly.    CURRENT CONCERNS Current Concerns  Post-Acute Placement   Other Concerns:    SOCIAL WORK ASSESSMENT / PLAN CSW met w patient at bedside, patient alert and oriented x4.  Patient was recently at [facility], then discharged to [facility] yesterday.  Patient has recurrance of breast cancer, treated to remission approx 9 years ago, returned 2 years ago and has been receiving chemo and radiation in [place] since then.  Per chart review, physicians have advised patient that she is not a candidate for further treatments and have recommended palliative care or residential hospice.    Patient is a divorced mother of one son (early 85s).  Son lives in [place] and has an active career, visits every few weeks as he is able.  Patient has no contact w ex-husband or his family.  Patient has 4 siblings, 2 in [place] and 2 in [place].  Patient feels strongly about the need for strong family ties, expresses dismay that [place] culture where her son was raised does not value family ties. Patient appears to wish for more contact w her family but is limited to contact by phone and occasional visits when they can come from  out of town.  Feels support from her neighbors and says they have visited daily.  Thinks they will be able to visit while at SNF in [place].    Patient has difficulties w insurance and disability applications/income.  Recently got breast and cervical Medicaid through [place], insurance does not cover long term care at [facility].  Patient says she has tried to apply for regular Medicaid but has been told her income [phone]) is too high.  Patient says she get social security payments, but does not have disability.  B Ratliffe confirmed that patient does have breast and cervical Medicaid, referred CSW to [facility]) to get further information about patient's status w Social Security.  CSW contacted Nevin Bloodgood [phone]) who needs patient permission to verify her status, is faxing consent form to [place].    Patient states that she wants any and all treatments possible for her cancer.  [place] provider and understands that they are going to try to develop an appropriate treatment.  Patient appears pleased that MDs will try to give her choices and options.    Patient has been placed at [facility] as a "hard to place" patient, as her current insurance does not cover SNF.  Patient expressed some frustration w [facility] says staff do not respond quickly enough, feels her voice is not heard, wants room closer to nurses station and private room if possible.  Wants to keep an IV in.  Explained to patient that she may need to make the best of her current placement, as this allows her to easily access care w her physicians at the [facility].  Discussed her concerns w [facility] admissions - she is 3 rooms away from nurses station and no private room is available.  [facility] will try to be reponsive to her concerns and address these as they can.  Patient also wants [facility] (says food offered is "bland").  Wants to see menu so she can make choices - explained that dietary at [facility]  will offer choices as possible.  Explained that IV is up to physician and encouraged patient to discuss her concerns w MD.    Patient has completed Masters degree in adult education at [employer], was in process of looking for job when she became ill 2 years ago.  As result, has not been able to work.  She is very knowledgeable about various forms of cancer treatment and options that might/might not be available.   Has also tried to apply for disability and full Medicaid, has been frustrated w lack of support/information available.  At present is unable to walk so cannot get to [facility] office to submit her application.    CSW will continue to support patient as she tries to find resources needed to support her desire to pursue continued treatment of her cancer.  Patient agreeable to return to [facility] as she understands this gives her access to [facility] cancer center treatment staff.  CSW also contacted B Ratliffe who assisted in verifying patient's current status w Medicaid.  Patient is [facility] resident - has Tyna Jaksch assigned as her Insurance account manager.   Assessment/plan status:  Psychosocial Support/Ongoing Assessment of Needs Other assessment/ plan:   Information/referral to community resources:   [employer] disability advocate    PATIENT'S/FAMILY'S RESPONSE TO PLAN OF CARE: Patient strongly expresses her desire to be involved in care decisions, wants information on options and choices available to her.  Wants to maintain control of her life as much as possible given her current medical status.  Appears hopeful that she will be able to benefit from aggressive treatment and is not yet ready to accept hospice care. Values interpersonal support and interaction, has been significantly isolated from family as they live far away.        Edwyna Shell, LCSW Clinical Social Worker [phone])

## 2014-01-30 NOTE — Progress Notes (Signed)
Patient ID: Debbie Larsen, female   DOB: 1951/08/20, 63 y.o.   MRN: [id number]  [facility] PROGRESS NOTE  Debbie Larsen WYO:[id number] DOB: [birth year] DOA: 02-22-14 PCP: Melrose Nakayama, MD  Brief narrative: 63 y.o. 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 She has undergone chemotherapy and radiation in the past and is no longer considered a candidate for any further definitive treatment. Patient has been in denial about her terminal condition. She has refused hospice. She was finally discharged to a skilled nursing facility yesterday from [facility] long hospital. She was brought in to [facility] because of onset of fever, abdominal discomfort, but has denied any nausea, vomiting, or diarrhea. She does have a chronic Foley ever since mid-March but she denied any discomfort in that area as well. Patient communicated to the ER doctor that she is waiting on a second opinion regarding her cancer. Physicians at [facility] long contacted oncology at [facility], but that second opinion is possible only on an outpatient basis. An appointment was subsequently made with oncology here at [facility].   Principal Problem:   Sepsis secondary to UTI (lower urinary tract infection) - source of the sepsis appears to be urinary - preliminary blood culture positive for g- rods - WBC is trending up and pt with persistent low grade fever  - will change ABX to Zosyn for better g- coverage - follow up on urine culture  - foley changed 23-Feb-2023 - continue IVF and monitor in SDU for today  Active Problems:   BREAST CANCER, right, IDC, Stage II, recepto + - will notify primary oncologist of pt's admission    HYPOTHYROIDISM, UNSPECIFIED - continue synthroid    Metastasis to bone - continue analgesia as needed    Carcinoma of unknown origin with met's - outpatient follow up    Hyperglycemia - will check A1C - place on SSI for  now    Hyponatremia - secondary to pre renal etiology - 119 on admission and 129 this AM - repeat BMP in AM and continue IVF    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 Chronic interstitial changes.   Antibiotics:  Maxipime February 23, 2023 --> 4/2  Zosyn 4/2 -->   Code Status: DNR Family Communication: Pt at bedside Disposition Plan: Remains inpatient   HPI/Subjective: No events overnight.   Objective: Filed Vitals:   01/30/14 0900 01/30/14 1000 01/30/14 1100 01/30/14 1200  BP: 104/58 115/69 112/66 115/63  Pulse:      Temp:      TempSrc:      Resp: _0 Height:      Weight:      SpO2:        Intake/Output Summary (Last 24 hours) at 01/30/14 1227 Last data filed at 01/30/14  1200  Gross per 24 hour  Intake 2205.92 ml  Output   2300 ml  Net -94.08 ml    Exam:   General:  Pt is alert, follows commands appropriately, not in acute distress, very cachectic   Cardiovascular: Regular rate and rhythm, S1/S2, no murmurs, no rubs, no gallops  Respiratory: Clear to auscultation bilaterally, no wheezing, no crackles, no rhonchi  Abdomen: Soft, non tender, non distended, bowel sounds present, no guarding   Data Reviewed: Basic Metabolic Panel:  Recent Labs Lab 01/24/14 0546 01/29/14 1939 01/30/14 0440  NA 133* 119* 129*  K 4.3 4.2 3.9  CL 98 83* 97  CO2 _0 GLUCOSE  136* 258* 231*  BUN _1 CREATININE 0.49* 0.62 0.54  CALCIUM 8.9 8.4 7.5*   Liver Function Tests:  Recent Labs Lab 01/24/14 0546 01/29/14 1939 01/30/14 0440  AST 66* 76* 72*  ALT 103* 137* 116*  ALKPHOS 221* 389* 329*  BILITOT 0.3 0.7 0.7  PROT 6.4 6.5 5.7*  ALBUMIN 2.8* 2.4* 2.1*   CBC:  Recent Labs Lab 01/24/14 0546 01/29/14 1939 01/30/14 0440  WBC 7.8 13.2* 14.1*  NEUTROABS  --  11.5*  --   HGB 10.0* 9.6* 8.4*  HCT 29.6* 27.2* 24.1*  MCV 80.0 79.5 80.1  PLT 184 112* 74*   CBG:  Recent Labs Lab 01/30/14 1135  GLUCAP 191*    Recent Results (from the past 240 hour(s))  CULTURE, BLOOD (ROUTINE X 2)     Status: None   Collection Time    01/29/14  7:50 PM      Result Value Ref Range Status   Specimen Description BLOOD PORTA CATH DRAWN BY RN KB   Final   Special Requests     Final   Value: BOTTLES DRAWN AEROBIC AND ANAEROBIC AEB=8CC ANA=4CC   Culture     Final   Value: GRAM NEGATIVE RODS     Gram Stain Report Called to,Read Back By and Verified With: BOBLEE RN ON [id number] AT 0850 BY RESSEGGER R     Performed at [facility]   Report Status PENDING   Incomplete  CULTURE, BLOOD (ROUTINE X 2)     Status: None   Collection Time    01/29/14  8:02 PM      Result Value Ref Range Status   Specimen Description BLOOD LEFT HAND   Final   Special Requests BOTTLES DRAWN AEROBIC ONLY 5CC   Final   Culture     Final   Value: GRAM NEGATIVE RODS Gram Stain Report Called to,Read Back By and Verified With: BOB LEE RN ON [id number] AT [place] BY RESSEGGER R     Performed at [facility]   Report Status PENDING   Incomplete     Scheduled Meds: . ceFEPime (MAXIPIME) IV  1 g Intravenous 3 times per day  . docusate sodium  100 mg Oral BID  . feeding supplement (RESOURCE BREEZE)  1 Container Oral TID BM  . gabapentin  100 mg Oral BID  . hydrocortisone sod succinate (SOLU-CORTEF) inj  100 mg Intravenous Q6H  . insulin aspart  0-9 Units Subcutaneous TID WC  .  levothyroxine  75 mcg Oral QAC breakfast  . polyethylene glycol  17 g Oral BID  . sodium chloride  3 mL Intravenous Q12H   Continuous Infusions: . sodium chloride 125 mL/hr at 01/30/14 1200     Faye Ramsay, MD  [facility] Pager [phone]  If 7PM-7AM, please  contact night-coverage [email or web address] Password [email or web address] 01/30/2014, 12:27 PM   LOS: 1 day

## 2014-01-30 NOTE — Consult Note (Signed)
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 is a pleasant 63 year old [place] woman who according to Dr. Jana Hakim (Hem/Onc): "The patient underwent a right breast core biopsy 03/29/2001 for an invasive ductal carcinoma associated with necrosis ([id number]). 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 The tumor was estrogen receptor 95% positive, progesterone receptor 96% positive, with an MIB-1 of 11%, and no HER-2 amplification by the HercepTest. She completed 6 cycles of CMF chemotherapy on 12/12/2001. There were minor delays, but no dose reductions. She received radiation to the right breast and regional lymph nodes to a total of 5040 cGy with a right breast boost of 12 Gray, completed 02/12/2002.  More recently, "Collie Siad" develop some right shoulder pain sometime in late 2012. She had followup mammograms of until 2011. There was no mammogram performed in 2012. She was seen by physical therapy in orthopedic surgery for evaluation of her right shoulder pain. She had a mammogram on 07/24/2012 which showed a suspicious mass behind eczema of the right breast. Physical exam the time did show multiple involved right axillary lymph nodes. 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 HER-2 nonamplified the ratio by CISH being 1.43. Proliferative index was 50%. MRI scan of both breasts performed on 08/31/2012 showed multiple involved  lymph nodes in the right axilla, no obvious masses in either breast. Unfortunately staging PET scan 09/12/2012 showed in addition to her right axillary recurrence, bilateral mediastinal and left lung nodules consistent with stage IV disease. Collie Siad was admitted thorugh the ED with progressive lower extremity weakness 01/13/2014. 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 Pulmonary Oncology and Radiation Oncology also have been consulted regarding further management-- please refer to Dr. Charlton Amor and [facility] notes"  Chart is reviewed.  She has received the following chemotherapeutics since implementation of CHL:     Carcinoma of unknown origin   09/20/2012 - 10/01/2013 Chemotherapy Abraxane   10/08/2013 Progression PET- marked progression with new disease   10/08/2013 - 01/28/2014 Chemotherapy Gilotrif     Radiotherapy:      Right axilla  and supraclavicular area5040cGy with 1200cGy boost  ending 4/15/20103.      Whole brain 12/2013.   Biotheranostics reviewed and the following genes were detected with genomic alterations (in parentheses):  1. EGFR (A743T- Exon 19)  2. SMARCB1 (Intronic)  3. PIK3CA (KIIIE, [place])  4. HRAS (SYNONYMOUS)  The patient's past dictations are reviewed.  It is clear in past dictations that the patient was adament about receiving treatment.  This is confirmed today during our discussion.  She has heard about the new treatment modalities such as immunotherapy and she wants to participate in this treatment and clinical trials.  She is not a candidate for clinical trial because of her brain metastases that exclude her from consideration.  This patient is a very strong willed lady, who is well-educated, and she  absolutely understands her diagnosis, prognosis, and her cancer care.  She made it clear that she understands that she will die from this cancer, but she wants to continue to try  treatment.  She is very frustrated with her past experiences with oncologist and she verbalizes that today.    Given her emotional and psychologic strength and her spirit, we would offer her a trial of nivolumab.  Although we need to verify that her insurance will pay for the medication as her genetics testing for activity with this drug was not tested in 2014.  She is agreeable and very thankful that we will consider this option.  She is not yet prepared to die without trying.  She is educated that her prognosis is terminal and that this intervention may not provide any benefit.  She is understanding of this information and accepting.   We will work on getting this medication approved.  We cannot treat until she recovers from her gram negative sepsis for which she is receiving antibiotics.  Depending on her hospital course, we would give the first dose at the [facility] during her hospitalization.   PAST MEDICAL HISTORY:   Past Medical History  Diagnosis Date  . 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 Hypothyroid   . Enlarged lymph nodes   . 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  . S/P radiation therapy     Right breast/regional lymph nodes/ 5040 cGy / 28 fractions/ bost of 1200 cGy/ 6 Fractions  . Status post chemotherapy     6 cycles of CMF completed on 12/12/2001  . Right shoulder pain     started late 2012  . Dry cough     intermittent  . SOB (shortness of breath)   . Use of letrozole (Femara) 03/14/2013  . Cholelithiasis   . Anemia   . Stroke   . BREAST CANCER 04/2001    s/p radiation therapy, chemotherapy and lumpectomy  . Metastasis to bone 03/13/13    PET scan results  . Metastasis to bone     breast primary  . Cancer     brain mets  . Lung cancer 10/08/2013  . 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     PAST SURGICAL HISTORY Past Surgical History  Procedure Laterality Date  . Tonsillectomy    . Breast lumpectomy  04/2001    excision of L breast fibroadenoma by Dr.   . Breast lumpectomy  05/16/2001    Right axillary sentinel lymph node biopsy.  . Portacath placement  09/19/2012    Procedure: INSERTION PORT-A-CATH;  Surgeon: Haywood Lasso, MD;  Location: [place];  Service: General;  Laterality: N/A;  . Lipoma excision Right 05/15/2013    Procedure: RIGHT AXILLARY LYMPH NODE DISSECTION;  Surgeon: Haywood Lasso, MD;  Location: [facility];  Service: General;  Laterality: Right;    FAMILY HISTORY: Family History  Problem Relation Age of Onset  . Diabetes Mother   . Hypertension Father   . Hypertension Sister   . Hypothyroidism Brother   . Cancer Neg Hx     SOCIAL HISTORY:  reports that she has never smoked. She has never used smokeless tobacco. She reports that she does not drink alcohol or use illicit drugs.  PERFORMANCE STATUS: The patient's  performance status is 4 - Bedbound  PHYSICAL EXAM: Most Recent Vital Signs: Blood pressure 102/66, pulse 116, temperature 99.1 F (37.3 C), temperature source Oral, resp. rate 27, height 5' (1.524 m), weight 104 lb 15 oz (47.6 kg), SpO2 99.00%. General appearance: alert, cooperative, appears stated age, no distress and alopecia, very well educated Head: Normocephalic, without obvious abnormality, atraumatic Eyes: negative findings: lids and lashes normal, conjunctivae and sclerae normal and corneas clear Throat: lips, mucosa, and tongue normal; teeth and gums normal Neck: supple, symmetrical, trachea midline Lungs: clear to auscultation bilaterally Heart: regular rate and rhythm Abdomen: normal findings: bowel sounds normal and soft, non-tender Extremities: extremities normal, atraumatic, no cyanosis or edema Lymph nodes: no right axillary node noted Neurologic:  paraplegia  LABORATORY DATA:  Results for orders placed during the hospital encounter of 01/29/14 (from the past 48 hour(s))  CBC WITH DIFFERENTIAL     Status: Abnormal   Collection Time    01/29/14  7:39 PM      Result Value Ref Range   WBC 13.2 (*) 4.0 - 10.5 K/uL   RBC 3.42 (*) 3.87 - 5.11 MIL/uL   Hemoglobin 9.6 (*) 12.0 - 15.0 g/dL   HCT 27.2 (*) 36.0 - 46.0 %   MCV 79.5  78.0 - 100.0 fL   MCH 28.1  26.0 - 34.0 pg   MCHC 35.3  30.0 - 36.0 g/dL   RDW 15.4  11.5 - 15.5 %   Platelets 112 (*) 150 - 400 K/uL   Neutrophils Relative % 87 (*) 43 - 77 %   Neutro Abs 11.5 (*) 1.7 - 7.7 K/uL   Comment: CORRECTED ON 04/01 AT 2012: PREVIOUSLY REPORTED AS 11.4   Lymphocytes Relative 6 (*) 12 - 46 %   Lymphs Abs 0.8  0.7 - 4.0 K/uL   Monocytes Relative 7  3 - 12 %   Monocytes Absolute 0.9  0.1 - 1.0 K/uL   Eosinophils Relative 0  0 - 5 %   Eosinophils Absolute 0.0  0.0 - 0.7 K/uL   Basophils Relative 0  0 - 1 %   Basophils Absolute 0.0  0.0 - 0.1 K/uL   WBC Morphology TOXIC GRANULATION     Comment: VACUOLATED NEUTROPHILS     DOHLE BODIES     INCREASED BANDS (>20% BANDS)   RBC Morphology BASOPHILIC STIPPLING     Smear Review PLATELET COUNT CONFIRMED BY SMEAR    COMPREHENSIVE METABOLIC PANEL     Status: Abnormal   Collection Time    01/29/14  7:39 PM      Result Value Ref Range   Sodium 119 (*) 137 - 147 mEq/L   Comment: CRITICAL RESULT CALLED TO, READ BACK BY AND VERIFIED WITH:     BELTON K AT 2015 ON [id number] BY FORSYTH K   Potassium 4.2  3.7 - 5.3 mEq/L   Chloride 83 (*) 96 - 112 mEq/L   CO2 21  19 - 32 mEq/L   Glucose, Bld 258 (*) 70 - 99 mg/dL   BUN 18  6 - 23 mg/dL   Creatinine, Ser 0.62  0.50 - 1.10 mg/dL   Calcium 8.4  8.4 - 10.5 mg/dL   Total Protein 6.5  6.0 - 8.3 g/dL   Albumin 2.4 (*) 3.5 - 5.2 g/dL   AST 76 (*) 0 - 37 U/L   ALT 137 (*) 0 - 35 U/L   Alkaline Phosphatase 389 (*) 39 - 117 U/L   Total Bilirubin 0.7  0.3 - 1.2 mg/dL   GFR calc non Af Amer >90  >90  mL/min   GFR calc Af Amer >90  >90 mL/min   Comment: (NOTE)     The eGFR has been calculated using the CKD EPI equation.     This calculation has not been validated in all clinical situations.     eGFR's persistently <90 mL/min signify possible Chronic Kidney     Disease.  I-STAT CG4 LACTIC ACID, ED     Status: Abnormal   Collection Time    01/29/14  7:40 PM      Result Value Ref Range   Lactic Acid, Venous 2.93 (*) 0.5 - 2.2 mmol/L  CULTURE, BLOOD (ROUTINE X 2)     Status: None   Collection Time    01/29/14  7:50 PM      Result Value Ref Range   Specimen Description BLOOD PORTA CATH DRAWN BY RN KB     Special Requests       Value: BOTTLES DRAWN AEROBIC AND ANAEROBIC AEB=8CC ANA=4CC   Culture       Value: GRAM NEGATIVE RODS     Gram Stain Report Called to,Read Back By and Verified With: BOBLEE RN ON [id number] AT 0850 BY RESSEGGER R     Performed at [facility]   Report Status PENDING    CULTURE, BLOOD (ROUTINE X 2)     Status: None   Collection Time    01/29/14  8:02 PM      Result Value Ref Range   Specimen Description BLOOD LEFT HAND     Special Requests BOTTLES DRAWN AEROBIC ONLY 5CC     Culture       Value: GRAM NEGATIVE RODS Gram Stain Report Called to,Read Back By and Verified With: BOB LEE RN ON [id number] AT 5176 BY RESSEGGER R     Performed at [facility]   Report Status PENDING    URINALYSIS, ROUTINE W REFLEX MICROSCOPIC     Status: Abnormal   Collection Time    01/29/14  8:04 PM      Result Value Ref Range   Color, Urine YELLOW  YELLOW   APPearance CLOUDY (*) CLEAR   Specific Gravity, Urine <1.005 (*) 1.005 - 1.030   pH 6.0  5.0 - 8.0   Glucose, UA NEGATIVE  NEGATIVE mg/dL   Hgb urine dipstick SMALL (*) NEGATIVE   Bilirubin Urine NEGATIVE  NEGATIVE   Ketones, ur NEGATIVE  NEGATIVE mg/dL   Protein, ur NEGATIVE  NEGATIVE mg/dL   Urobilinogen, UA 0.2  0.0 - 1.0 mg/dL   Nitrite POSITIVE (*) NEGATIVE   Leukocytes, UA LARGE (*) NEGATIVE  URINE  MICROSCOPIC-ADD ON     Status: Abnormal   Collection Time    01/29/14  8:04 PM      Result Value Ref Range   Squamous Epithelial / LPF RARE  RARE   WBC, UA TOO NUMEROUS TO COUNT  <3 WBC/hpf   RBC / HPF 7-10  <3 RBC/hpf   Bacteria, UA MANY (*) RARE  MRSA PCR SCREENING     Status: None   Collection Time    01/29/14 10:28 PM      Result Value Ref Range   MRSA by PCR NEGATIVE  NEGATIVE   Comment:            The GeneXpert MRSA Assay (FDA     approved for NASAL specimens     only), is one component of a  comprehensive MRSA colonization     surveillance program. It is not     intended to diagnose MRSA     infection nor to guide or     monitor treatment for     MRSA infections.  COMPREHENSIVE METABOLIC PANEL     Status: Abnormal   Collection Time    01/30/14  4:40 AM      Result Value Ref Range   Sodium 129 (*) 137 - 147 mEq/L   Comment: DELTA CHECK NOTED   Potassium 3.9  3.7 - 5.3 mEq/L   Chloride 97  96 - 112 mEq/L   CO2 21  19 - 32 mEq/L   Glucose, Bld 231 (*) 70 - 99 mg/dL   BUN 16  6 - 23 mg/dL   Creatinine, Ser 0.54  0.50 - 1.10 mg/dL   Calcium 7.5 (*) 8.4 - 10.5 mg/dL   Total Protein 5.7 (*) 6.0 - 8.3 g/dL   Albumin 2.1 (*) 3.5 - 5.2 g/dL   AST 72 (*) 0 - 37 U/L   ALT 116 (*) 0 - 35 U/L   Alkaline Phosphatase 329 (*) 39 - 117 U/L   Total Bilirubin 0.7  0.3 - 1.2 mg/dL   GFR calc non Af Amer >90  >90 mL/min   GFR calc Af Amer >90  >90 mL/min   Comment: (NOTE)     The eGFR has been calculated using the CKD EPI equation.     This calculation has not been validated in all clinical situations.     eGFR's persistently <90 mL/min signify possible Chronic Kidney     Disease.  CBC     Status: Abnormal   Collection Time    01/30/14  4:40 AM      Result Value Ref Range   WBC 14.1 (*) 4.0 - 10.5 K/uL   RBC 3.01 (*) 3.87 - 5.11 MIL/uL   Hemoglobin 8.4 (*) 12.0 - 15.0 g/dL   HCT 24.1 (*) 36.0 - 46.0 %   MCV 80.1  78.0 - 100.0 fL   MCH 27.9  26.0 - 34.0 pg   MCHC 34.9   30.0 - 36.0 g/dL   RDW 15.7 (*) 11.5 - 15.5 %   Platelets 74 (*) 150 - 400 K/uL   Comment: SPECIMEN CHECKED FOR CLOTS     PLATELET COUNT CONFIRMED BY SMEAR  LACTIC ACID, PLASMA     Status: None   Collection Time    01/30/14  4:40 AM      Result Value Ref Range   Lactic Acid, Venous 1.7  0.5 - 2.2 mmol/L  GLUCOSE, CAPILLARY     Status: Abnormal   Collection Time    01/30/14 11:35 AM      Result Value Ref Range   Glucose-Capillary 191 (*) 70 - 99 mg/dL   Comment 1 Notify RN     Comment 2 Documented in Chart        RADIOGRAPHY: Dg Chest Port 1 View  01/29/2014   CLINICAL DATA:  FEVER FEVER  EXAM: PORTABLE CHEST - 1 VIEW  COMPARISON:  MR T SPINE W/CM dated 01/21/2014; CT CHEST W/CM dated 01/14/2014; DG CHEST 1V PORT dated 09/19/2012  FINDINGS: The cardiac silhouette is enlarged. There are no focal infiltrates, effusions, nor edema. Stable prominence of the interstitial markings is appreciated. A left sided porta catheter is demonstrated via a subclavian approach with tip projecting in the region of the superior vena cava . No acute osseous abnormalities.  IMPRESSION: No  evidence of acute cardiopulmonary disease. Chronic interstitial changes.   Electronically Signed   By: Margaree Mackintosh M.D.   On: 01/29/2014 19:50        ASSESSMENT:  1. Metastatic and its prognosis., of suspect lung primary (versus breast) with bone, mediastinal, lymphangitic spread, intramedullary thoracic spinal cord, and brain metastases. 2. History of breast cancer in 2002-2003 3. Terminal prognosis but completely cogent and able to carry on a highly sophisticated discussion about her disease extent and its ramifications. 4. 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 5. Gram negative sepsis 6. DNR  Patient Active Problem List   Diagnosis Date Noted  . Sepsis 01/29/2014  . UTI (lower urinary tract infection) 01/29/2014  . DNR (do not resuscitate) 01/28/2014  . 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 Protein-calorie malnutrition, severe 01/14/2014  . Hyponatremia 01/13/2014  . Weakness 01/13/2014  . Elevated liver enzymes 01/13/2014  . Liver metastasis 01/13/2014  . Brain metastases 01/13/2014  . Bone metastasis 01/13/2014  . 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 Cancer   . History of breast cancer 12/18/2013  . Lung cancer 10/08/2013  . Carcinoma of unknown origin 10/01/2013  . Fever presenting with conditions classified elsewhere 08/13/2013  . Fever 08/13/2013  . Metastasis to bone   . Anemia 03/21/2013  . Generalized anxiety disorder 12/25/2012  . Osteopenia 08/03/2012  . Vitamin D insufficiency 06/28/2012  . Vertigo following CVA (cerebrovascular accident) 05/16/2012  . Stroke 05/11/2012  . Shoulder pain, right 03/01/2012  . POSTMENOPAUSAL STATUS 08/17/2010  . BREAST CANCER, right, IDC, Stage II, recepto + 12/28/2006  . HYPOTHYROIDISM, UNSPECIFIED 12/28/2006  . Prediabetes 12/28/2006     PLAN:  1. I personally reviewed and went over laboratory results with the patient.  The results are noted within this dictation. 2. I personally reviewed and went over radiographic studies with the patient.  The results are noted within this dictation.   3. I personally reviewed and went over pathology results with the patient. 4. Continue antibiotics as deemed fit. 5. Will work on getting Nivolumab approved for her. She is aware of the potential benefit of immunotherapy despite the extent of her disease. Certainly this offers a totally different approach to her management, not relying on anti-proliferative effects of cytotoxic agents but rather checkpoint inhibition loss enlightening T- lymphocytes to recognize the presence of her disease and to destroy it through killer cell activity. Whether or not the agent itself gets beyond the blood-brain barrier may be a moot point since recruited T lymphocytes do. 6. Will start nivolumab following  resolution of her gram negative sepsis, depending on hospital course 7. Will follow along.  All questions were answered. The patient knows to call the clinic with any problems, questions or concerns. We can certainly see the patient much sooner if necessary.  Patient and plan discussed with Dr. Farrel Gobble and he is in agreement with the aforementioned.   KEFALAS,THOMAS 01/30/2014

## 2014-01-30 NOTE — Progress Notes (Signed)
Inpatient Diabetes Program Recommendations  AACE/ADA: New Consensus Statement on Inpatient Glycemic Control (2013)  Target Ranges:  Prepandial:   less than 140 mg/dL      Peak postprandial:   less than 180 mg/dL (1-2 hours)      Critically ill patients:  140 - 180 mg/dL   Results for [facility] (MRN [id number]) as of 01/30/2014 07:24  Ref. Range 01/29/2014 19:39 01/30/2014 04:40  Glucose Latest Range: 70-99 mg/dl 258 (H) 231 (H)   Diabetes history: No Outpatient Diabetes medications: NA Current orders for Inpatient glycemic control: None  Inpatient Diabetes Program Recommendations Correction (SSI): Please consider ordering CBGs with Novolog correction ACHS while on steroids if appropriate given patient situation.  Thanks, Barnie Alderman, RN, MSN, CCRN Diabetes Coordinator Inpatient Diabetes Program [phone] (Team Pager) [phone] (AP office) [phone] [facility] office)

## 2014-01-30 NOTE — Progress Notes (Signed)
UR chart review completed.  

## 2014-01-30 NOTE — Progress Notes (Signed)
Brief Nutrition Note Ht: 5' Wt: 104 lb (47.6 kg) BMI: 20.5  Diet order: Regular Labs:  Lab Results  Component Value Date   HGBA1C 6.4* 05/12/2012   - Pt arrived at [facility] on 01/28/2014 and was very unhappy with the food service she had. We talked with the pt regarding her specific needs and provided her what we could at that time. She requested vegetables, potatoes, salad, and tea; all of which we were able to provide.   - Pt arrived at [facility] ICU 01/30/2014 and was seen again to follow up with patient and RN concerning tolerance of food and PO intake. Pt reports that she is forcing herself to eat and is trying. She knows that she needs to get protein because of her condition, and since she is a vegetarian it is difficult. She reported that she drinks 2 cups of milk (with added honey and tumeric) each day and eggs in the morning as her protein source.   - Pt is tolerating the Resource Breeze but complains of the high content of sugar in the supplement. She does not want to drink much anymore because she has been told that her blood sugars have raised and needed insulin.   - Will continue to monitor PO intake. Pt has very specific nutrition concerns and needs. Pt does not like ensure, glucerna, or muscle milk.   Avel Peace, BS Nutrition Intern Pager: [phone]

## 2014-01-30 NOTE — Care Management Note (Signed)
    Page 1 of 1   01/30/2014     1:29:52 PM   CARE MANAGEMENT NOTE 01/30/2014  Patient:  CARTHA, ROTERT   Account Number:  [id number]  Date Initiated:  01/30/2014  Documentation initiated by:  Theophilus Kinds  Subjective/Objective Assessment:   Pt admitted from [facility] with sepsis and UTI. Pt will return to facility when medically stable.     Action/Plan:   CSW is aware and will collect the pts information from the chart.   Anticipated DC Date:  02/05/2014   Anticipated DC Plan:  SKILLED NURSING FACILITY  In-house referral  Clinical Social Worker      DC Planning Services  CM consult      Choice offered to / List presented to:             Status of service:  Completed, signed off Medicare Important Message given?   (If response is "NO", the following Medicare IM given date fields will be blank) Date Medicare IM given:   Date Additional Medicare IM given:    Discharge Disposition:  [place]  Per UR Regulation:    If discussed at Long Length of Stay Meetings, dates discussed:    Comments:  01/30/14 [place], RN BSN CM

## 2014-01-31 LAB — GLUCOSE, CAPILLARY
GLUCOSE-CAPILLARY: 158 mg/dL — AB (ref 70–99)
Glucose-Capillary: 118 mg/dL — ABNORMAL HIGH (ref 70–99)
Glucose-Capillary: 232 mg/dL — ABNORMAL HIGH (ref 70–99)
Glucose-Capillary: 130 mg/dL — ABNORMAL HIGH (ref 70–99)

## 2014-01-31 LAB — BASIC METABOLIC PANEL
BUN: 14 mg/dL (ref 6–23)
CALCIUM: 7.5 mg/dL — AB (ref 8.4–10.5)
CHLORIDE: 99 meq/L (ref 96–112)
CO2: 22 mEq/L (ref 19–32)
CREATININE: 0.38 mg/dL — AB (ref 0.50–1.10)
GFR calc non Af Amer: >90 mL/min (ref >90)
Glucose, Bld: 170 mg/dL — ABNORMAL HIGH (ref 70–99)
Potassium: 3.9 mEq/L (ref 3.7–5.3)
Sodium: 132 mEq/L — ABNORMAL LOW (ref 137–147)

## 2014-01-31 LAB — HEMOGLOBIN A1C
Hgb A1c MFr Bld: 6.5 % — ABNORMAL HIGH (ref <5.7)
Mean Plasma Glucose: 140 mg/dL — ABNORMAL HIGH (ref <117)

## 2014-01-31 LAB — CBC
HCT: 24.5 % — ABNORMAL LOW (ref 36.0–46.0)
Hemoglobin: 8.5 g/dL — ABNORMAL LOW (ref 12.0–15.0)
MCH: 28.1 pg (ref 26.0–34.0)
MCHC: 34.7 g/dL (ref 30.0–36.0)
MCV: 80.9 fL (ref 78.0–100.0)
PLATELETS: 70 10*3/uL — AB (ref 150–400)
RBC: 3.03 MIL/uL — ABNORMAL LOW (ref 3.87–5.11)
RDW: 16.2 % — ABNORMAL HIGH (ref 11.5–15.5)
WBC: 15 10*3/uL — AB (ref 4.0–10.5)

## 2014-01-31 MED ORDER — LEVOTHYROXINE SODIUM 75 MCG PO TABS
75.0000 ug | ORAL_TABLET | ORAL | Status: DC
  Administered 2014-01-31 – 2014-02-03 (×4): 75 ug via ORAL
  Filled 2014-01-31 (×3): qty 1

## 2014-01-31 MED ORDER — HYDROCORTISONE NA SUCCINATE PF 100 MG IJ SOLR
100.0000 mg | Freq: Two times a day (BID) | INTRAMUSCULAR | Status: DC
  Administered 2014-02-01: 100 mg via INTRAVENOUS
  Filled 2014-01-31: qty 2

## 2014-01-31 MED ORDER — FLEET ENEMA 7-19 GM/118ML RE ENEM
1.0000 | ENEMA | Freq: Once | RECTAL | Status: AC
  Administered 2014-01-31: 1 via RECTAL

## 2014-01-31 NOTE — Progress Notes (Signed)
Patient ID: Debbie Larsen, female   DOB: 1950/11/18, 63 y.o.   MRN: [id number] [facility] PROGRESS NOTE  Debbie Larsen OVA:[id number] DOB: 1951-01-16 DOA: 01/29/2014 PCP: Melrose Nakayama, MD  Brief narrative:  63 y.o. 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 She has undergone chemotherapy and radiation in the past and is no longer considered a candidate for any further definitive treatment. Patient has been in denial about her terminal condition. She has refused hospice. She was finally discharged to a skilled nursing facility yesterday from [facility]. She was brought in to [facility] because of onset of fever, abdominal discomfort, but has denied any nausea, vomiting, or diarrhea. She does have a chronic Foley ever since mid-March but she denied any discomfort in that area as well. Patient communicated to the ER doctor that she is waiting on a second opinion regarding her cancer. Physicians at [facility] long contacted oncology at [facility], but that second opinion is possible only on an outpatient basis. An appointment was subsequently made with oncology here at [facility].   Principal Problem:  Sepsis secondary to UTI (lower urinary tract infection)  - source of the sepsis appears to be urinary  - preliminary blood culture positive for E. Coli - WBC is trending up  - continue Zosyn day #2 - follow up on urine culture  - foley changed 4/1  - continue IVF  Active Problems:  BREAST CANCER, right, IDC, Stage II, recepto +  - will notify primary oncologist of pt's admission  HYPOTHYROIDISM, UNSPECIFIED  - continue synthroid  Metastasis to bone  - continue analgesia as needed  Carcinoma of unknown origin with met's  - outpatient follow up  Hyperglycemia  - will check A1C but likely from decadron  - place on SSI for now  Hyponatremia  - secondary to pre renal etiology  - 119 --> 129  --> 132 this AM - repeat BMP in AM and continue IVF  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  - will need PT while inpatient and will likely go back to SNF once medically stable  Severe malnutrition  - secondary to progressive nature on the malignancy  - encouraged PO intake as pt able to tolerate  Anemia of chronic disease  - Hg remains stable over the past 24 hours  - will continue to hold all heparin products for now and place on SCD's for DVT prophylaxis  Thrombocytopenia  - unclear etiology  - continue to hold all heparin products as noted above and repeat CBC in AM   Consultants:  Oncology  Procedures/Studies:  Dg Chest Port 1 View 01/29/2014 No evidence of acute cardiopulmonary disease. Chronic interstitial changes.  Antibiotics:  Maxipime 4/1 --> 4/2  Zosyn 4/2 -->   Code Status: DNR  Family Communication: Pt at bedside  Disposition Plan: Transfer to telemetry bed    HPI/Subjective: No events overnight.   Objective: Filed Vitals:   01/31/14 0700 01/31/14 0800 01/31/14 1000 01/31/14 1200  BP: 124/65 120/65 140/68 118/89  Pulse:      Temp:  97.4 F (36.3 C)  98.5 F (36.9 C)  TempSrc:  Oral  Oral  Resp: _0 Height:      Weight:      SpO2:        Intake/Output Summary (Last 24 hours) at 01/31/14 1438 Last data filed at 01/31/14 1200  Gross per 24 hour  Intake   2850 ml  Output   2750 ml  Net    100 ml    Exam:   General:  Pt is alert, follows commands appropriately, not in acute distress, cachectic   Cardiovascular: Regular rate and rhythm, S1/S2, no murmurs, no rubs, no gallops  Respiratory: Clear to auscultation bilaterally, no wheezing, no crackles, no rhonchi  Abdomen: Soft, non tender, non distended, bowel sounds present, no guarding  Extremities: No edema, pulses DP and PT palpable bilaterally  Data Reviewed: Basic Metabolic Panel:  Recent Labs Lab 01/29/14 1939 01/30/14 0440 01/31/14 0412  NA 119* 129* 132*  K 4.2 3.9 3.9   CL 83* 97 99  CO2 _0 GLUCOSE 258* 231* 170*  BUN _1 CREATININE 0.62 0.54 0.38*  CALCIUM 8.4 7.5* 7.5*   Liver Function Tests:  Recent Labs Lab 01/29/14 1939 01/30/14 0440  AST 76* 72*  ALT 137* 116*  ALKPHOS 389* 329*  BILITOT 0.7 0.7  PROT 6.5 5.7*  ALBUMIN 2.4* 2.1*   CBC:  Recent Labs Lab 01/29/14 1939 01/30/14 0440 01/31/14 0412  WBC 13.2* 14.1* 15.0*  NEUTROABS 11.5*  --   --   HGB 9.6* 8.4* 8.5*  HCT 27.2* 24.1* 24.5*  MCV 79.5 80.1 80.9  PLT 112* 74* 70*   CBG:  Recent Labs Lab 01/30/14 1135 01/30/14 1701 01/30/14 2150 01/31/14 0744 01/31/14 1145  GLUCAP 191* 202* 196* 158* 118*    Recent Results (from the past 240 hour(s))  CULTURE, BLOOD (ROUTINE X 2)     Status: None   Collection Time    01/29/14  7:50 PM      Result Value Ref Range Status   Specimen Description BLOOD PORTA CATH DRAWN BY RN KB   Final   Special Requests     Final   Value: BOTTLES DRAWN AEROBIC AND ANAEROBIC AEB=8CC ANA=4CC   Culture  Setup Time     Final   Value: 01/30/2014 14:07     Performed at [facility]   Culture     Final   Value: ESCHERICHIA COLI     Note: Gram Stain Report Called to,Read Back By and Verified With: BOBLEE RN ON [id number] AT 0850 BY RESSEGGER R     Performed at [facility]   Report Status PENDING   Incomplete  CULTURE, BLOOD (ROUTINE X 2)     Status: None   Collection Time    01/29/14  8:02 PM      Result Value Ref Range Status   Specimen Description BLOOD LEFT HAND   Final   Special Requests BOTTLES DRAWN AEROBIC ONLY 5CC   Final   Culture  Setup Time     Final   Value: 01/30/2014 14:04     Performed at [facility]   Culture     Final   Value: ESCHERICHIA COLI     Note: Gram Stain Report Called to,Read Back By and Verified With: BOB LEE RN ON [id number] AT [place] BY RESSEGGER R     Performed at [facility]   Report Status PENDING   Incomplete  URINE CULTURE     Status: None   Collection Time     01/29/14  8:08 PM      Result Value Ref Range Status   Specimen Description URINE, CATHETERIZED   Final   Special Requests NONE   Final   Culture  Setup Time     Final  Value: 01/30/2014 14:10     Performed at [place]     Final   Value: >=100,000 COLONIES/ML     Performed at [facility]   Culture     Final   Value: [place]     Performed at [facility]   Report Status PENDING   Incomplete  MRSA PCR SCREENING     Status: None   Collection Time    01/29/14 10:28 PM      Result Value Ref Range Status   MRSA by PCR NEGATIVE  NEGATIVE Final   Comment:            The GeneXpert MRSA Assay (FDA     approved for NASAL specimens     only), is one component of a     comprehensive MRSA colonization     surveillance program. It is not     intended to diagnose MRSA     infection nor to guide or     monitor treatment for     MRSA infections.     Scheduled Meds: . docusate sodium  100 mg Oral BID  . feeding supplement (RESOURCE BREEZE)  1 Container Oral TID BM  . gabapentin  100 mg Oral BID  . hydrocortisone sod succinate (SOLU-CORTEF) inj  100 mg Intravenous Q6H  . insulin aspart  0-9 Units Subcutaneous TID WC  . levothyroxine  75 mcg Oral Daily  . piperacillin-tazobactam (ZOSYN)  IV  3.375 g Intravenous Q8H  . polyethylene glycol  17 g Oral BID  . sodium chloride  3 mL Intravenous Q12H   Continuous Infusions: . sodium chloride 125 mL/hr at 01/31/14 1200   Faye Ramsay, MD  [facility] Pager [phone]  If 7PM-7AM, please contact night-coverage [email or web address] Password [email or web address] 01/31/2014, 2:38 PM   LOS: 2 days

## 2014-01-31 NOTE — Progress Notes (Signed)
Present with patient for continued emotional and spiritual support.Patient continues to be very expressive about her cancer diagnosis.  She discussed family/faith dynamics and her support as she has sought connection to many sources during her illness. Her mother died within the past year and she discussed how she had not wanted her to know she was sick during that time. She stated she felt much more at peace with several things today. She shared several times about how she feels she mayl not be alive to participate in the continuing lives of her son and other family members.  Prayed with her as she continues to share her need for spiritual support.

## 2014-01-31 NOTE — Clinical Social Work Note (Signed)
Patient signed forms to allow Yevonne Aline, disability specialist at [facility], to assist her w social security disability application.  Forms mailed to Ms Apolonio Schneiders at her request.  [facility], [address], [place], [place].  Patient agreeable to Ms Apolonio Schneiders acting as her representative w Social Security.  CSW will continue to follow and assist as needed.    Edwyna Shell, LCSW Clinical Social Worker [phone])

## 2014-01-31 NOTE — Progress Notes (Signed)
Report called to L.Foote,LPN. Patient transferred to 324 in stable condition via bed.

## 2014-02-01 ENCOUNTER — Encounter (HOSPITAL_COMMUNITY): Payer: Self-pay

## 2014-02-01 LAB — CULTURE, BLOOD (ROUTINE X 2)

## 2014-02-01 LAB — CBC
HEMATOCRIT: 24.1 % — AB (ref 36.0–46.0)
HEMOGLOBIN: 8.3 g/dL — AB (ref 12.0–15.0)
MCH: 27.8 pg (ref 26.0–34.0)
MCHC: 34.4 g/dL (ref 30.0–36.0)
MCV: 80.6 fL (ref 78.0–100.0)
Platelets: 95 10*3/uL — ABNORMAL LOW (ref 150–400)
RBC: 2.99 MIL/uL — AB (ref 3.87–5.11)
RDW: 16.3 % — ABNORMAL HIGH (ref 11.5–15.5)
WBC: 12.1 10*3/uL — ABNORMAL HIGH (ref 4.0–10.5)

## 2014-02-01 LAB — GLUCOSE, CAPILLARY
GLUCOSE-CAPILLARY: 78 mg/dL (ref 70–99)
GLUCOSE-CAPILLARY: 197 mg/dL — AB (ref 70–99)
Glucose-Capillary: 150 mg/dL — ABNORMAL HIGH (ref 70–99)
Glucose-Capillary: 95 mg/dL (ref 70–99)

## 2014-02-01 LAB — BASIC METABOLIC PANEL
BUN: 17 mg/dL (ref 6–23)
CO2: 23 meq/L (ref 19–32)
CREATININE: 0.54 mg/dL (ref 0.50–1.10)
Calcium: 7.8 mg/dL — ABNORMAL LOW (ref 8.4–10.5)
Chloride: 101 mEq/L (ref 96–112)
GFR calc Af Amer: >90 mL/min (ref >90)
Glucose, Bld: 101 mg/dL — ABNORMAL HIGH (ref 70–99)
Potassium: 3.5 mEq/L — ABNORMAL LOW (ref 3.7–5.3)
Sodium: 135 mEq/L — ABNORMAL LOW (ref 137–147)

## 2014-02-01 MED ORDER — SENNOSIDES-DOCUSATE SODIUM 8.6-50 MG PO TABS
2.0000 | ORAL_TABLET | Freq: Once | ORAL | Status: AC
  Administered 2014-02-01: 2 via ORAL
  Filled 2014-02-01: qty 2

## 2014-02-01 MED ORDER — HYDROCORTISONE NA SUCCINATE PF 100 MG IJ SOLR
100.0000 mg | Freq: Two times a day (BID) | INTRAMUSCULAR | Status: DC
  Administered 2014-02-01 – 2014-02-03 (×4): 100 mg via INTRAVENOUS
  Filled 2014-02-01 (×4): qty 2

## 2014-02-01 MED ORDER — ALBUTEROL SULFATE (2.5 MG/3ML) 0.083% IN NEBU: 2.5000 mg | INHALATION_SOLUTION | RESPIRATORY_TRACT | Status: DC | PRN

## 2014-02-01 NOTE — Progress Notes (Signed)
Patient requested that solu-cortef be given at 6pm and 6am. Refused the 10 pm dose because it bothers her sleeping. Pharmacy was called and solu-cortef will be changed to 6am and 6pm. Will continue to monitor.

## 2014-02-01 NOTE — Progress Notes (Signed)
Patient requested a room change because the room she was in (324) made her claustrophobic. Patient was moved to 320 and states "this is much better." Will continue to monitor.

## 2014-02-01 NOTE — Progress Notes (Signed)
Patient ID: Debbie Larsen, female   DOB: 1951-05-07, 63 y.o.   MRN: [id number]  [facility] PROGRESS NOTE  Debbie Larsen JQB:[id number] DOB: 09-06-1951 DOA: 01/29/2014 PCP: Debbie Nakayama, MD  Brief narrative:  63 y.o. 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 She has undergone chemotherapy and radiation in the past and is no longer considered a candidate for any further definitive treatment. Patient has been in denial about her terminal condition. She has refused hospice. She was finally discharged to a skilled nursing facility yesterday from [facility] long hospital. She was brought in to [facility] because of onset of fever, abdominal discomfort, but has denied any nausea, vomiting, or diarrhea. She does have a chronic Foley ever since mid-March but she denied any discomfort in that area as well. Patient communicated to the ER doctor that she is waiting on a second opinion regarding her cancer. Physicians at [facility] long contacted oncology at [facility], but that second opinion is possible only on an outpatient basis. An appointment was subsequently made with oncology here at [facility].   Principal Problem:  Sepsis secondary to UTI (lower urinary tract infection)  - source of the sepsis appears to be urinary  - preliminary blood culture positive for E. 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 Chronic interstitial changes.  Antibiotics:  Maxipime 4/1 --> 4/2  Zosyn 4/2 -->   Code Status: DNR  Family Communication: Pt at bedside  Disposition Plan: SNF in 1-2 days    HPI/Subjective: No events overnight.   Objective: Filed Vitals:   01/31/14 1000 01/31/14 1200 01/31/14 1600 01/31/14 2246  BP: 140/68 118/89 144/80 134/79  Pulse:    78  Temp:  98.5 F (36.9 C)  98.2 F (36.8 C)  TempSrc:  Oral  Oral  Resp: 24 20  20   Height:      Weight:      SpO2:    100%    Intake/Output Summary (Last 24 hours) at 02/01/14 1046 Last data filed at 02/01/14 0700  Gross per 24 hour  Intake  1575 ml  Output   1350 ml  Net    225 ml    Exam:   General:  Pt is alert, follows commands appropriately, not in acute distress  Cardiovascular: Regular rate and rhythm, S1/S2, no murmurs, no rubs, no gallops  Respiratory: Clear to auscultation bilaterally, no wheezing, no crackles, no rhonchi  Abdomen: Soft, non tender, non distended, bowel sounds present, no guarding  Data Reviewed: Basic Metabolic Panel:  Recent Labs Lab 01/29/14 1939 01/30/14 0440 01/31/14 0412 02/01/14 0534  NA 119* 129* 132* 135*  K 4.2 3.9 3.9 3.5*  CL 83* 97 99 101  CO2 21 21 22 23   GLUCOSE 258* 231* 170* 101*  BUN 18 16 14 17    CREATININE 0.62 0.54 0.38* 0.54  CALCIUM 8.4 7.5* 7.5* 7.8*   Liver Function Tests:  Recent Labs Lab 01/29/14 1939 01/30/14 0440  AST 76* 72*  ALT 137* 116*  ALKPHOS 389* 329*  BILITOT 0.7 0.7  PROT 6.5 5.7*  ALBUMIN 2.4* 2.1*   CBC:  Recent Labs Lab 01/29/14 1939 01/30/14 0440 01/31/14 0412 02/01/14 0534  WBC 13.2* 14.1* 15.0* 12.1*  NEUTROABS 11.5*  --   --   --   HGB 9.6* 8.4* 8.5* 8.3*  HCT 27.2* 24.1* 24.5* 24.1*  MCV 79.5 80.1 80.9 80.6  PLT 112* 74* 70* 95*   CBG:  Recent Labs Lab 01/31/14 0744 01/31/14 1145 01/31/14 1726 01/31/14 2124 02/01/14 0814  GLUCAP 158* 118* 232* 130* 78    Recent Results (from the past 240 hour(s))  CULTURE, BLOOD (ROUTINE X 2)     Status: None   Collection Time    01/29/14  7:50 PM      Result Value Ref Range Status   Specimen Description BLOOD PORTA CATH DRAWN BY RN KB   Final   Special Requests     Final   Value: BOTTLES DRAWN AEROBIC AND ANAEROBIC AEB=8CC ANA=4CC   Culture  Setup Time     Final   Value: 01/30/2014 14:07     Performed at [facility]   Culture     Final   Value: ESCHERICHIA COLI     Note: Gram Stain Report Called to,Read Back By and Verified With: BOBLEE RN ON [id number] AT 0850 BY RESSEGGER R     Performed at [facility]   Report Status 02/01/2014 FINAL   Final   Organism ID, Bacteria ESCHERICHIA COLI   Final  CULTURE, BLOOD (ROUTINE X 2)     Status: None   Collection Time    01/29/14  8:02 PM      Result Value Ref Range Status   Specimen Description BLOOD LEFT HAND   Final   Special Requests BOTTLES DRAWN AEROBIC ONLY 5CC   Final   Culture  Setup Time     Final   Value: 01/30/2014 14:04     Performed at [facility]   Culture     Final   Value: ESCHERICHIA COLI     Note: SUSCEPTIBILITIES PERFORMED ON PREVIOUS CULTURE WITHIN THE LAST 5 DAYS.     Note: Gram Stain Report Called to,Read Back By and Verified With: BOB LEE RN ON [id number] AT 4709 BY RESSEGGER R     Performed  at [facility]   Report Status 02/01/2014 FINAL   Final  URINE CULTURE     Status: None   Collection Time    01/29/14  8:08 PM      Result Value  Ref Range Status   Specimen Description URINE, CATHETERIZED   Final   Special Requests NONE   Final   Culture  Setup Time     Final   Value: 01/30/2014 14:10     Performed at [place]     Final   Value: >=100,000 COLONIES/ML     Performed at [facility]   Culture     Final   Value: [place]     Performed at [facility]   Report Status PENDING   Incomplete  MRSA PCR SCREENING     Status: None   Collection Time    01/29/14 10:28 PM      Result Value Ref Range Status   MRSA by PCR NEGATIVE  NEGATIVE Final   Comment:            The GeneXpert MRSA Assay (FDA     approved for NASAL specimens     only), is one component of a     comprehensive MRSA colonization     surveillance program. It is not     intended to diagnose MRSA     infection nor to guide or     monitor treatment for     MRSA infections.     Scheduled Meds: . docusate sodium  100 mg Oral BID  . feeding supplement (RESOURCE BREEZE)  1 Container Oral TID BM  . gabapentin  100 mg Oral BID  . hydrocortisone sod succinate (SOLU-CORTEF) inj  100 mg Intravenous Q12H  . insulin aspart  0-9 Units Subcutaneous TID WC  . levothyroxine  75 mcg Oral Daily  . piperacillin-tazobactam (ZOSYN)  IV  3.375 g Intravenous Q8H  . polyethylene glycol  17 g Oral BID  . sodium chloride  3 mL Intravenous Q12H   Continuous Infusions:  Faye Ramsay, MD  [facility] Pager [phone]  If 7PM-7AM, please contact night-coverage [email or web address] Password [email or web address] 02/01/2014, 10:46 AM   LOS: 3 days

## 2014-02-02 ENCOUNTER — Encounter: Payer: Self-pay | Admitting: Internal Medicine

## 2014-02-02 DIAGNOSIS — R509 Fever, unspecified: Secondary | ICD-10-CM | POA: Insufficient documentation

## 2014-02-02 LAB — CBC
HCT: 23.5 % — ABNORMAL LOW (ref 36.0–46.0)
HEMOGLOBIN: 8.2 g/dL — AB (ref 12.0–15.0)
MCH: 28.1 pg (ref 26.0–34.0)
MCHC: 34.9 g/dL (ref 30.0–36.0)
MCV: 80.5 fL (ref 78.0–100.0)
PLATELETS: 106 10*3/uL — AB (ref 150–400)
RBC: 2.92 MIL/uL — ABNORMAL LOW (ref 3.87–5.11)
RDW: 15.8 % — ABNORMAL HIGH (ref 11.5–15.5)
WBC: 9.1 10*3/uL (ref 4.0–10.5)

## 2014-02-02 LAB — GLUCOSE, CAPILLARY
GLUCOSE-CAPILLARY: 92 mg/dL (ref 70–99)
Glucose-Capillary: 101 mg/dL — ABNORMAL HIGH (ref 70–99)
Glucose-Capillary: 135 mg/dL — ABNORMAL HIGH (ref 70–99)
Glucose-Capillary: 164 mg/dL — ABNORMAL HIGH (ref 70–99)

## 2014-02-02 LAB — BASIC METABOLIC PANEL
BUN: 17 mg/dL (ref 6–23)
CO2: 27 mEq/L (ref 19–32)
Calcium: 7.9 mg/dL — ABNORMAL LOW (ref 8.4–10.5)
Chloride: 98 mEq/L (ref 96–112)
Creatinine, Ser: 0.51 mg/dL (ref 0.50–1.10)
GFR calc Af Amer: >90 mL/min (ref >90)
GLUCOSE: 132 mg/dL — AB (ref 70–99)
POTASSIUM: 3.4 meq/L — AB (ref 3.7–5.3)
SODIUM: 136 meq/L — AB (ref 137–147)

## 2014-02-02 LAB — URINE CULTURE: Colony Count: >=100000

## 2014-02-02 NOTE — Progress Notes (Addendum)
Patient ID: Debbie Larsen, female   DOB: Jan 04, 1951, 63 y.o.   MRN: [id number]  [facility] PROGRESS NOTE  Debbie Larsen HQR:[id number] DOB: 07/09/1951 DOA: 01/29/2014 PCP: Melrose Nakayama, MD  Brief narrative:  63 y.o. 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 She has undergone chemotherapy and radiation in the past and is no longer considered a candidate for any further definitive treatment. Patient has been in denial about her terminal condition. She has refused hospice. She was finally discharged to a skilled nursing facility yesterday from [facility] long hospital. She was brought in to [facility] because of onset of fever, abdominal discomfort, but has denied any nausea, vomiting, or diarrhea. She does have a chronic Foley ever since mid-March but she denied any discomfort in that area as well. Patient communicated to the ER doctor that she is waiting on a second opinion regarding her cancer. Physicians at [facility] long contacted oncology at [facility], but that second opinion is possible only on an outpatient basis. An appointment was subsequently made with oncology here at [facility].   Principal Problem:  Sepsis secondary to UTI (lower urinary tract infection)  - source of the sepsis appears to be urinary  - preliminary blood culture positive for E. 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 Chronic interstitial changes.  Antibiotics:  Maxipime 4/1 --> 4/2  Zosyn 4/2 -->   Code Status: DNR  Family Communication: Pt at bedside  Disposition Plan: SNF in AM   HPI/Subjective: No events overnight.   Objective: Filed Vitals:   01/31/14 2246 02/01/14 1530 02/01/14 2151 02/02/14 0649  BP: 134/79 137/82 159/80 146/78  Pulse: 78 88 80 70  Temp: 98.2 F (36.8 C) 99.2 F (37.3 C) 99 F (37.2 C) 97.9 F (36.6 C)  TempSrc: Oral Oral Oral Oral  Resp: 20 20 20 20   Height:      Weight:      SpO2: 100% 98% 100% 100%    Intake/Output Summary (Last 24 hours) at 02/02/14 1002 Last data filed at 02/02/14 0500  Gross per 24 hour  Intake  100 ml  Output   1500 ml  Net  -1400 ml    Exam:   General:  Pt is alert, follows commands appropriately, not in acute distress  Cardiovascular: Regular rate and rhythm, S1/S2, no murmurs, no rubs, no gallops  Respiratory: Clear to auscultation bilaterally, no wheezing, no crackles, no rhonchi  Abdomen: Soft, non tender, non distended, bowel sounds present, no guarding   Data Reviewed: Basic Metabolic Panel:  Recent Labs Lab 01/29/14 1939 01/30/14 0440 01/31/14 0412 02/01/14 0534 02/02/14 0627  NA 119* 129* 132* 135* 136*  K 4.2 3.9 3.9 3.5* 3.4*   CL 83* 97 99 101 98  CO2 21 21 22 23 27   GLUCOSE 258* 231* 170* 101* 132*  BUN 18 16 14 17 17   CREATININE 0.62 0.54 0.38* 0.54 0.51  CALCIUM 8.4 7.5* 7.5* 7.8* 7.9*   Liver Function Tests:  Recent Labs Lab 01/29/14 1939 01/30/14 0440  AST 76* 72*  ALT 137* 116*  ALKPHOS 389* 329*  BILITOT 0.7 0.7  PROT 6.5 5.7*  ALBUMIN 2.4* 2.1*   No results found for this basename: LIPASE, AMYLASE,  in the last 168 hours No results found for this basename: AMMONIA,  in the last 168 hours CBC:  Recent Labs Lab 01/29/14 1939 01/30/14 0440 01/31/14 0412 02/01/14 0534 02/02/14 0627  WBC 13.2* 14.1* 15.0* 12.1* 9.1  NEUTROABS 11.5*  --   --   --   --   HGB 9.6* 8.4* 8.5* 8.3* 8.2*  HCT 27.2* 24.1* 24.5* 24.1* 23.5*  MCV 79.5 80.1 80.9 80.6 80.5  PLT 112* 74* 70* 95* 106*   CBG:  Recent Labs Lab 02/01/14 0814 02/01/14 1131 02/01/14 1630 02/01/14 2146 02/02/14 0751  GLUCAP 78 150* 95 197* 101*    Recent Results (from the past 240 hour(s))  CULTURE, BLOOD (ROUTINE X 2)     Status: None   Collection Time    01/29/14  7:50 PM      Result Value Ref Range Status   Specimen Description BLOOD PORTA CATH DRAWN BY RN KB   Final   Special Requests     Final   Value: BOTTLES DRAWN AEROBIC AND ANAEROBIC AEB=8CC ANA=4CC   Culture  Setup Time     Final   Value: 01/30/2014 14:07     Performed at [facility]   Culture     Final   Value: ESCHERICHIA COLI     Note: Gram Stain Report Called to,Read Back By and Verified With: BOBLEE RN ON [id number] AT 0850 BY RESSEGGER R     Performed at [facility]   Report Status 02/01/2014 FINAL   Final   Organism ID, Bacteria ESCHERICHIA COLI   Final  CULTURE, BLOOD (ROUTINE X 2)     Status: None   Collection Time    01/29/14  8:02 PM      Result Value Ref Range Status   Specimen Description BLOOD LEFT HAND   Final   Special Requests BOTTLES DRAWN AEROBIC ONLY 5CC   Final   Culture  Setup Time     Final   Value: 01/30/2014 14:04      Performed at [facility]   Culture     Final   Value: ESCHERICHIA COLI     Note: SUSCEPTIBILITIES PERFORMED ON PREVIOUS CULTURE WITHIN THE LAST 5 DAYS.     Note: Gram Stain Report Called to,Read Back By and Verified With: BOB LEE RN ON [id number] AT [place]  R     Performed at [facility]   Report Status 02/01/2014 FINAL   Final  URINE CULTURE     Status: None   Collection Time    01/29/14  8:08 PM      Result Value Ref Range Status   Specimen Description URINE, CATHETERIZED   Final   Special Requests NONE   Final   Culture  Setup Time     Final   Value: 01/30/2014 14:10     Performed at [place]     Final   Value: >=100,000 COLONIES/ML     Performed at [facility]   Culture     Final   Value: ESCHERICHIA COLI     Performed at [facility]   Report Status PENDING   Incomplete  MRSA PCR SCREENING     Status: None   Collection Time    01/29/14 10:28 PM      Result Value Ref Range Status   MRSA by PCR NEGATIVE  NEGATIVE Final   Comment:            The GeneXpert MRSA Assay (FDA     approved for NASAL specimens     only), is one component of a     comprehensive MRSA colonization     surveillance program. It is not     intended to diagnose MRSA     infection nor to guide or     monitor treatment for     MRSA infections.  CULTURE, BLOOD (ROUTINE X 2)     Status: None   Collection Time    02/01/14  1:42 PM      Result Value Ref Range Status   Specimen Description BLOOD PICC LINE DRAWN BY RN   Final   Special Requests     Final   Value: BOTTLES DRAWN AEROBIC AND ANAEROBIC 8CC EACH BOTTLE   Culture NO GROWTH 1 DAY   Final   Report Status PENDING   Incomplete  CULTURE, BLOOD (ROUTINE X 2)     Status: None   Collection Time    02/01/14  1:55 PM      Result Value Ref Range Status   Specimen Description BLOOD RIGHT ANTECUBITAL   Final   Special Requests     Final   Value: BOTTLES DRAWN AEROBIC AND ANAEROBIC  8CC EACH BOTTLE   Culture NO GROWTH 1 DAY   Final   Report Status PENDING   Incomplete     Scheduled Meds: . docusate sodium  100 mg Oral BID  . gabapentin  100 mg Oral BID  . hydrocortisone sod succinate (SOLU-CORTEF) inj  100 mg Intravenous BID  . insulin aspart  0-9 Units Subcutaneous TID WC  . levothyroxine  75 mcg Oral Daily  . piperacillin-tazobactam (ZOSYN)  IV  3.375 g Intravenous Q8H  . polyethylene glycol  17 g Oral BID  . sodium chloride  3 mL Intravenous Q12H   Continuous Infusions:  Faye Ramsay, MD  [facility] Pager [phone]  If 7PM-7AM, please contact night-coverage [email or web address] Password [email or web address] 02/02/2014, 10:02 AM   LOS: 4 days

## 2014-02-03 ENCOUNTER — Inpatient Hospital Stay
Admission: RE | Admit: 2014-02-03 | Discharge: 2014-02-14 | Disposition: A | Discharge status: Other Acute Inpatient Hospital | Payer: Medicaid Other | Source: Ambulatory Visit | Attending: Internal Medicine | Admitting: Internal Medicine

## 2014-02-03 ENCOUNTER — Other Ambulatory Visit (HOSPITAL_COMMUNITY): Payer: Self-pay | Admitting: Oncology

## 2014-02-03 DIAGNOSIS — C781 Secondary malignant neoplasm of mediastinum: Secondary | ICD-10-CM | POA: Diagnosis not present

## 2014-02-03 DIAGNOSIS — C7952 Secondary malignant neoplasm of bone marrow: Secondary | ICD-10-CM | POA: Diagnosis not present

## 2014-02-03 DIAGNOSIS — C801 Malignant (primary) neoplasm, unspecified: Secondary | ICD-10-CM | POA: Diagnosis not present

## 2014-02-03 DIAGNOSIS — C7951 Secondary malignant neoplasm of bone: Secondary | ICD-10-CM | POA: Diagnosis not present

## 2014-02-03 DIAGNOSIS — C778 Secondary and unspecified malignant neoplasm of lymph nodes of multiple regions: Secondary | ICD-10-CM | POA: Diagnosis not present

## 2014-02-03 LAB — BASIC METABOLIC PANEL
BUN: 15 mg/dL (ref 6–23)
CO2: 28 mEq/L (ref 19–32)
CREATININE: 0.49 mg/dL — AB (ref 0.50–1.10)
Calcium: 8 mg/dL — ABNORMAL LOW (ref 8.4–10.5)
Chloride: 96 mEq/L (ref 96–112)
GLUCOSE: 147 mg/dL — AB (ref 70–99)
POTASSIUM: 3.2 meq/L — AB (ref 3.7–5.3)
Sodium: 136 mEq/L — ABNORMAL LOW (ref 137–147)

## 2014-02-03 LAB — CBC
HEMATOCRIT: 24.2 % — AB (ref 36.0–46.0)
HEMOGLOBIN: 8.3 g/dL — AB (ref 12.0–15.0)
MCH: 27.8 pg (ref 26.0–34.0)
MCHC: 34.3 g/dL (ref 30.0–36.0)
MCV: 80.9 fL (ref 78.0–100.0)
Platelets: 123 10*3/uL — ABNORMAL LOW (ref 150–400)
RBC: 2.99 MIL/uL — ABNORMAL LOW (ref 3.87–5.11)
RDW: 15.7 % — AB (ref 11.5–15.5)
WBC: 9.7 10*3/uL (ref 4.0–10.5)

## 2014-02-03 LAB — GLUCOSE, CAPILLARY: GLUCOSE-CAPILLARY: 127 mg/dL — AB (ref 70–99)

## 2014-02-03 MED ORDER — HYDROCORTISONE 10 MG PO TABS: 20.0000 mg | ORAL_TABLET | Freq: Two times a day (BID) | ORAL | Status: AC

## 2014-02-03 MED ORDER — DIAZEPAM 5 MG/ML PO CONC: 5.0000 mg | Freq: Four times a day (QID) | ORAL | Status: DC | PRN

## 2014-02-03 MED ORDER — HYDROCODONE-ACETAMINOPHEN 5-325 MG PO TABS: 1.0000 | ORAL_TABLET | ORAL | Status: DC | PRN

## 2014-02-03 MED ORDER — CIPROFLOXACIN HCL 250 MG PO TABS
250.0000 mg | ORAL_TABLET | Freq: Two times a day (BID) | ORAL | Status: DC
  Administered 2014-02-03: 250 mg via ORAL
  Filled 2014-02-03: qty 1

## 2014-02-03 MED ORDER — POTASSIUM CHLORIDE 10 MEQ/100ML IV SOLN
10.0000 meq | INTRAVENOUS | Status: AC
  Administered 2014-02-03 (×2): 10 meq via INTRAVENOUS
  Filled 2014-02-03: qty 100

## 2014-02-03 MED ORDER — PROCHLORPERAZINE MALEATE 10 MG PO TABS: 10.0000 mg | ORAL_TABLET | Freq: Four times a day (QID) | ORAL | Status: AC | PRN

## 2014-02-03 MED ORDER — CIPROFLOXACIN HCL 250 MG PO TABS: 250.0000 mg | ORAL_TABLET | Freq: Two times a day (BID) | ORAL | Status: DC

## 2014-02-03 NOTE — Patient Instructions (Signed)
[place]   CHEMOTHERAPY INSTRUCTIONS  Opd1vo -    This medication is given every 14 days over 60 minutes.   What should I know about potential side effects? Although they may not be experienced by every patient, OPDIVO can cause serious side effects. Because OPDIVO works with your immune system, it may cause your immune system to attack normal organs and tissues in your body and can affect the way they work. These problems can sometimes become life-threatening and can lead to death. The most common side effects of OPDIVO in people with squamous NSCLC are feeling tired; shortness of breath; pain in muscles, bones, and joints; decreased appetite; cough; nausea; and constipation. Your healthcare provider will check you for these problems during treatment with OPDIVO.   Important Safety Information OPDIVO (nivolumab) is a medicine that may treat your lung cancer by working with your immune system. OPDIVO can cause your immune system to attack normal organs and tissues in many areas of your body and can affect the way they work. These problems can sometimes become serious or life-threatening and can lead to death. These problems may happen anytime during treatment or even after your treatment has ended.  Serious side effects may include:  Lung problems (pneumonitis). Symptoms of pneumonitis may include: new or worsening cough; chest pain; and shortness of breath.  Intestinal problems (colitis) that can lead to tears or holes in your intestine. Signs and symptoms of colitis may include: diarrhea (loose stools) or more bowel movements than usual; blood in your stools or dark, tarry, sticky stools; and severe stomach area (abdomen) pain or tenderness.  Liver problems (hepatitis). Signs and symptoms of hepatitis may include: yellowing of your skin or the whites of your eyes; severe nausea or vomiting; pain on the right side of your stomach area (abdomen); drowsiness;  dark urine (tea colored); bleeding or bruise more easily than normal; and feeling less hungry than usual.  Kidney problems, including nephritis and kidney failure. Signs of kidney problems may include: decrease in the amount of urine; blood in your urine; swelling in your ankles; and loss of appetite.  Hormone gland problems (especially the thyroid, pituitary, and glands). Signs and symptoms that your hormone glands are not working properly may include: headaches that will not go away or unusual headaches; extreme tiredness, weight gain or weight loss; changes in mood or behavior, such as decreased sex drive, irritability, or forgetfulness; dizziness or fainting; hair loss; feeling cold; constipation; and voice gets deeper.  Problems in other organs. Signs of these problems include: rash; changes in eyesight; severe or persistent muscle or joint pains; and severe muscle weakness.  Getting medical treatment right away may keep these problems from becoming more serious. Your doctor will check you for these problems during treatment with OPDIVO. Your doctor may treat you with corticosteroid medicines and delay or completely stop treatment with OPDIVO, if you have severe side effects.   Tell your healthcare provider about: Your health problems or concerns if you: have immune system problems such as Crohn's disease, ulcerative colitis, or lupus; have had an organ transplant; have lung, or breathing problems; have liver problems; or have any other medical conditions  All the medicines you take, including prescription and over-the-counter medicines, vitamins, and herbal supplements  The most common side effects of OPDIVO in people with squamous non-small cell lung cancer include: feeling tired; shortness of breath; pain in muscles, bones, and joints; decreased appetite; cough; nausea; and constipation.  Tell  your doctor if you have any side effect that bothers you or that does not go away. These are not all  the possible side effects of OPDIVO. For more information, ask your healthcare provider or pharmacist.    EDUCATIONAL MATERIALS GIVEN AND REVIEWED: Chemotherapy and You Specific Instruction Sheets: Opdivo     SYMPTOMS TO REPORT AS SOON AS POSSIBLE AFTER TREATMENT:   FEVER GREATER THAN 100.5 F  CHILLS WITH OR WITHOUT FEVER  NAUSEA AND VOMITING THAT IS NOT CONTROLLED WITH YOUR NAUSEA MEDICATION  UNUSUAL SHORTNESS OF BREATH  UNUSUAL BRUISING OR BLEEDING  TENDERNESS IN MOUTH AND THROAT WITH OR WITHOUT PRESENCE OF ULCERS  URINARY PROBLEMS  BOWEL PROBLEMS  UNUSUAL RASH    Wear comfortable clothing and clothing appropriate for easy access to any Portacath or PICC line. Let us know if there is anything that we can do to make your therapy better!      I have been informed and understand all of the instructions given to me and have received a copy. I have been instructed to call the clinic [phone] or my family physician as soon as possible for continued medical care, if indicated. I do not have any more questions at this time but understand that I may call the [place] or the Patient Navigator at [phone] during office hours should I have questions or need assistance in obtaining follow-up care.      _________________________________________      _______________     __________ Signature of Patient or Authorized Representative        Date                            Time      _________________________________________ Nurse's Signature

## 2014-02-03 NOTE — Progress Notes (Signed)
Patient discharged to [facility].  IV removed and port deaccessed - WNL.  Report called and given to Tabitha at facility.  Patient in and out cathed before transfer with 1700 cc removed.  Belongings packed and sent with patient along with packet per SW.  Patient reluctant to DC, explained that MD feels that patient stable to be DC'd at this time. MD contacted and has no further reasons to speak with patient.  Patient transferred in bed with assist of Nt to nursing center.

## 2014-02-03 NOTE — Discharge Summary (Signed)
Physician Discharge Summary  Debbie Larsen NKN:[id number] DOB: 1951/09/04 DOA: 01/29/2014  PCP: Melrose Nakayama, MD  Admit date: 01/29/2014 Discharge date: 02/03/2014  Recommendations for Outpatient Follow-up:  1. Pt will need to follow up with PCP in 2-3 weeks post discharge 2. Please obtain BMP to evaluate electrolytes and kidney function, potassium level 3. Please also check CBC to evaluate Hg and Hct levels 4. Pt wants to have in/out cath as opposed to foley cath placed, will respect wishes and will ask for Q4-6 hours of in/out cath 5. Also continue hydrocortisone tablet until pt seen by oncologist and follow further recommendations  6. 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 She has undergone chemotherapy and radiation in the past and is no longer considered a candidate for any further definitive treatment. Patient has been in denial about her terminal condition. She has refused hospice. She was finally discharged to a skilled nursing facility yesterday from [facility] long hospital. She was brought in to [facility] because of onset of fever, abdominal discomfort, but has denied any nausea, vomiting, or diarrhea. She does have a chronic Foley ever since mid-March but she denied any discomfort in that area as well. Patient communicated to the ER doctor  that she is waiting on a second opinion regarding her cancer. Physicians at [facility] long contacted oncology at [facility], but that second opinion is possible only on an outpatient basis. An appointment was subsequently made with oncology here at [facility].   Principal Problem:  Sepsis secondary to UTI (lower urinary tract infection)  - source of the sepsis appears to be urinary  - preliminary blood culture positive for E. Coli  - WBC is trending down  - continue Zosyn day #5 and transition to oral Cipro to complete therapy for 10 more days post discharge  - foley changed 4/1 - d/c foley and continue In/Out cath Q4-6 hours   Active Problems:  BREAST CANCER, right, IDC, Stage II, recepto +  - follow up in an outpatient setting  HYPOTHYROIDISM, UNSPECIFIED  - continue synthroid  Metastasis to bone  - continue analgesia as needed  Carcinoma of unknown origin with met's  - outpatient follow up  Hyperglycemia  - likely from steroids  Hypokalemia  - pt refusing potassium supplements  Hyponatremia  - secondary to pre renal etiology  - 119 --> 129 --> 132 --> 135 --> 136 this AM  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 Chronic interstitial changes.  Antibiotics:  Maxipime 4/1 --> 4/2  Zosyn 4/2 --> 4/6 Ciprofloxacin 4/6 --> 10 more days post discharge   Code Status: DNR  Family Communication: Pt at bedside  Disposition Plan: SNF today  Discharge Exam: Filed Vitals:    02/03/14 0634  BP: 138/61  Pulse: 70  Temp: 98.5 F (36.9 C)  Resp: 18   Filed Vitals:   02/01/14 2151 02/02/14 0649 02/02/14 2111 02/03/14 0634  BP: 159/80 146/78 131/59 138/61  Pulse: 80 70 81 70  Temp: 99 F (37.2 C) 97.9 F (36.6 C) 98.6 F (37 C) 98.5 F (36.9 C)  TempSrc: Oral Oral Oral Oral  Resp: 20 20 20 18   Height:      Weight:      SpO2: 100% 100%  100%    General: Pt is alert, follows commands appropriately, not in acute distress Cardiovascular: Regular rate and rhythm, S1/S2 +, no murmurs, no rubs, no gallops Respiratory: Clear to auscultation bilaterally, no wheezing, no crackles, no rhonchi Abdominal: Soft, non tender, non distended, bowel sounds +, no guarding  Discharge Instructions  Discharge Orders   Future Appointments Provider Department Dept Phone   02/18/2014 10:40 AM Rexene Edison, MD [facility] [phone]   Future Orders Complete By Expires   Diet - low sodium heart healthy  As directed    Increase activity slowly  As directed        Medication List         acetaminophen 325 MG tablet  Commonly known as:  TYLENOL  Take 2 tablets (650 mg total) by mouth every 6 (six) hours as needed for moderate pain, fever or headache.     alum & mag hydroxide-simeth 200-200-20 MG/5ML suspension  Commonly known as:  MAALOX/MYLANTA  Take 15 mLs by mouth every 4 (four) hours as needed for indigestion or heartburn.     B-complex with vitamin C tablet  Take 1 tablet by mouth daily.     ciprofloxacin 250 MG tablet  Commonly known as:  CIPRO  Take 1 tablet (250 mg total) by mouth 2 (two) times daily.     diazepam 5 MG/ML solution  Commonly known as:  DIAZEPAM INTENSOL  Take 1 mL (5 mg total) by mouth every 6 (six) hours as needed for anxiety (or slepp).     feeding supplement (RESOURCE BREEZE) Liqd  Take 1 Container by mouth 3 (three) times daily between meals.     gabapentin 100 MG capsule  Commonly known as:   NEURONTIN  Take 1 capsule (100 mg total) by mouth 2 (two) times daily.     HYDROcodone-acetaminophen 5-325 MG per tablet  Commonly known as:  NORCO/VICODIN  Take 1-2 tablets by mouth every 4 (four) hours as needed for moderate pain.     hydrocortisone 10 MG tablet  Commonly known as:  CORTEF  Take 2 tablets (20 mg total) by mouth 2 (two) times daily.     levothyroxine 75 MCG tablet  Commonly known as:  SYNTHROID, LEVOTHROID  Take 1 tablet (75 mcg total) by mouth every evening.     lidocaine-prilocaine cream  Commonly known as:  EMLA  Apply topically as needed. Apply to port 1-2 hours before procedure     magnesium citrate Soln  Take 296 mLs (1 Bottle total) by mouth daily as  needed for severe constipation.     methylPREDNISolone acetate 40 MG/ML injection  Commonly known as:  DEPO-MEDROL  Give 40 mg IM every 2 weeks, first dose 01/29/14.     multivitamin with minerals Tabs tablet  Take 1 tablet by mouth daily.     prochlorperazine 10 MG tablet  Commonly known as:  COMPAZINE  Take 1 tablet (10 mg total) by mouth every 6 (six) hours as needed for nausea or vomiting.     senna-docusate 8.6-50 MG per tablet  Commonly known as:  Senokot-S  Take 2 tablets by mouth at bedtime as needed for mild constipation.     sodium phosphate 7-19 GM/118ML Enem  Place 1 enema rectally daily as needed for severe constipation.           Follow-up Information   Schedule an appointment as soon as possible for a visit with Melrose Nakayama, MD.   Specialty:  Family Medicine   Contact information:   [address] [place] [address] [phone]        The results of significant diagnostics from this hospitalization (including imaging, microbiology, ancillary and laboratory) are listed below for reference.     Microbiology: Recent Results (from the past 240 hour(s))  CULTURE, BLOOD (ROUTINE X 2)     Status: None   Collection Time    01/29/14  7:50 PM      Result Value Ref Range  Status   Specimen Description BLOOD PORTA CATH DRAWN BY RN KB   Final   Special Requests     Final   Value: BOTTLES DRAWN AEROBIC AND ANAEROBIC AEB=8CC ANA=4CC   Culture  Setup Time     Final   Value: 01/30/2014 14:07     Performed at [facility]   Culture     Final   Value: ESCHERICHIA COLI     Note: Gram Stain Report Called to,Read Back By and Verified With: BOBLEE RN ON [id number] AT [place] R     Performed at [facility]   Report Status 02/01/2014 FINAL   Final   Organism ID, Bacteria ESCHERICHIA COLI   Final  CULTURE, BLOOD (ROUTINE X 2)     Status: None   Collection Time    01/29/14  8:02 PM      Result Value Ref Range Status   Specimen Description BLOOD LEFT HAND   Final   Special Requests BOTTLES DRAWN AEROBIC ONLY 5CC   Final   Culture  Setup Time     Final   Value: 01/30/2014 14:04     Performed at [facility]   Culture     Final   Value: ESCHERICHIA COLI     Note: SUSCEPTIBILITIES PERFORMED ON PREVIOUS CULTURE WITHIN THE LAST 5 DAYS.     Note: Gram Stain Report Called to,Read Back By and Verified With: BOB LEE RN ON [id number] AT [id number] BY [place]SE[place]ER R     Performed at [facility]   Report Status 02/01/2014 FINAL   Final  URINE CULTURE     Status: None   Collection Time    01/29/14  8:08 PM      Result Value Ref Range Status   Specimen Description URINE, CATHETERIZED   Final   Special Requests NONE   Final   Culture  Setup Time     Final   Value: 01/30/2014 14:10     Performed at [place]  Final   Value: >=100,000 COLONIES/ML     Performed at [facility]   Culture     Final   Value: [place]     Performed at [facility]   Report Status 02/02/2014 FINAL   Final   Organism ID, Bacteria ESCHERICHIA COLI   Final   Organism ID, Bacteria CITROBACTER KOSERI   Final  MRSA PCR SCREENING     Status: None   Collection Time    01/29/14 10:28 PM      Result  Value Ref Range Status   MRSA by PCR NEGATIVE  NEGATIVE Final   Comment:            The GeneXpert MRSA Assay (FDA     approved for NASAL specimens     only), is one component of a     comprehensive MRSA colonization     surveillance program. It is not     intended to diagnose MRSA     infection nor to guide or     monitor treatment for     MRSA infections.  CULTURE, BLOOD (ROUTINE X 2)     Status: None   Collection Time    02/01/14  1:42 PM      Result Value Ref Range Status   Specimen Description BLOOD PICC LINE DRAWN BY RN   Final   Special Requests     Final   Value: BOTTLES DRAWN AEROBIC AND ANAEROBIC 8CC EACH BOTTLE   Culture NO GROWTH 2 DAYS   Final   Report Status PENDING   Incomplete  CULTURE, BLOOD (ROUTINE X 2)     Status: None   Collection Time    02/01/14  1:55 PM      Result Value Ref Range Status   Specimen Description BLOOD RIGHT ANTECUBITAL   Final   Special Requests     Final   Value: BOTTLES DRAWN AEROBIC AND ANAEROBIC 8CC EACH BOTTLE   Culture NO GROWTH 2 DAYS   Final   Report Status PENDING   Incomplete     Labs: Basic Metabolic Panel:  Recent Labs Lab 01/30/14 0440 01/31/14 0412 02/01/14 0534 02/02/14 0627 02/03/14 0611  NA 129* 132* 135* 136* 136*  K 3.9 3.9 3.5* 3.4* 3.2*  CL 97 99 101 98 96  CO2 21 22 23 27 28   GLUCOSE 231* 170* 101* 132* 147*  BUN 16 14 17 17 15   CREATININE 0.54 0.38* 0.54 0.51 0.49*  CALCIUM 7.5* 7.5* 7.8* 7.9* 8.0*   Liver Function Tests:  Recent Labs Lab 01/29/14 1939 01/30/14 0440  AST 76* 72*  ALT 137* 116*  ALKPHOS 389* 329*  BILITOT 0.7 0.7  PROT 6.5 5.7*  ALBUMIN 2.4* 2.1*   CBC:  Recent Labs Lab 01/29/14 1939 01/30/14 0440 01/31/14 0412 02/01/14 0534 02/02/14 0627 02/03/14 0611  WBC 13.2* 14.1* 15.0* 12.1* 9.1 9.7  NEUTROABS 11.5*  --   --   --   --   --   HGB 9.6* 8.4* 8.5* 8.3* 8.2* 8.3*  HCT 27.2* 24.1* 24.5* 24.1* 23.5* 24.2*  MCV 79.5 80.1 80.9 80.6 80.5 80.9  PLT 112* 74* 70* 95*  106* 123*   CBG:  Recent Labs Lab 02/02/14 0751 02/02/14 1202 02/02/14 1652 02/02/14 2117 02/03/14 0758  GLUCAP 101* 92 135* 164* 127*     SIGNED: Time coordinating discharge: Over 30 minutes  Faye Ramsay, MD  [facility] 02/03/2014, 11:35 AM Pager [phone]  If 7PM-7AM, please contact night-coverage [email or web address] Password [email or web address]

## 2014-02-03 NOTE — Discharge Instructions (Signed)
Urinary Tract Infection  Urinary tract infections (UTIs) can develop anywhere along your urinary tract. Your urinary tract is your body's drainage system for removing wastes and extra water. Your urinary tract includes two kidneys, two ureters, a bladder, and a urethra. Your kidneys are a pair of bean-shaped organs. Each kidney is about the size of your fist. They are located below your ribs, one on each side of your spine.  CAUSES  Infections are caused by microbes, which are microscopic organisms, including fungi, viruses, and bacteria. These organisms are so small that they can only be seen through a microscope. Bacteria are the microbes that most commonly cause UTIs.  SYMPTOMS   Symptoms of UTIs may vary by age and gender of the patient and by the location of the infection. Symptoms in young women typically include a frequent and intense urge to urinate and a painful, burning feeling in the bladder or urethra during urination. Older women and men are more likely to be tired, shaky, and weak and have muscle aches and abdominal pain. A fever may mean the infection is in your kidneys. Other symptoms of a kidney infection include pain in your back or sides below the ribs, nausea, and vomiting.  DIAGNOSIS  To diagnose a UTI, your caregiver will ask you about your symptoms. Your caregiver also will ask to provide a urine sample. The urine sample will be tested for bacteria and white blood cells. White blood cells are made by your body to help fight infection.  TREATMENT   Typically, UTIs can be treated with medication. Because most UTIs are caused by a bacterial infection, they usually can be treated with the use of antibiotics. The choice of antibiotic and length of treatment depend on your symptoms and the type of bacteria causing your infection.  HOME CARE INSTRUCTIONS   If you were prescribed antibiotics, take them exactly as your caregiver instructs you. Finish the medication even if you feel better after you  have only taken some of the medication.   Drink enough water and fluids to keep your urine clear or pale yellow.   Avoid caffeine, tea, and carbonated beverages. They tend to irritate your bladder.   Empty your bladder often. Avoid holding urine for long periods of time.   Empty your bladder before and after sexual intercourse.   After a bowel movement, women should cleanse from front to back. Use each tissue only once.  SEEK MEDICAL CARE IF:    You have back pain.   You develop a fever.   Your symptoms do not begin to resolve within 3 days.  SEEK IMMEDIATE MEDICAL CARE IF:    You have severe back pain or lower abdominal pain.   You develop chills.   You have nausea or vomiting.   You have continued burning or discomfort with urination.  MAKE SURE YOU:    Understand these instructions.   Will watch your condition.   Will get help right away if you are not doing well or get worse.  Document Released: 07/27/2005 Document Revised: 04/17/2012 Document Reviewed: 11/25/2011  ExitCare Patient Information 2014 [employer].

## 2014-02-03 NOTE — Clinical Social Work Note (Signed)
Patient ready for discharge to [facility] SNF today per MD.  [facility] admissions informed and agreeable, patient wants to remain another day at [facility] in order to "get stronger."  CSW told patient discharge decisions are made by MD.  Patient wants CSW to contact [facility] regarding her Medicaid status, CSW will contact her worker.  CSW has also referred patient to Yevonne Aline, disability advocate at [facility], for assistance w obtaining information on her disability status.  Patient given name and contact information.  Patient asked CSW to convey several requests to [facility], as follows:  Room nearer nurses station, different roommate who "doesnt talk all night", and more quick response by staff to her personal care needs.  Questioned whether [facility] staff can assist w in/out cath needed.  CSW provided encouragement and reassurance, advised patient that discharge today was most likely scenario as MDs felt she was medically stable to return to SNF.  Left VM for Marianna Fuss w patient requests.  CSW signing off as no further SW needs identified, will speak w patient if CSW is able to contact her Medicaid worker.  Edwyna Shell, LCSW Clinical Social Worker [phone])

## 2014-02-03 NOTE — Progress Notes (Signed)
Subjective: Patient seen in bed.  She is scheduled for Nivolumab therapy on Wednesday at 10:45 AM.  I provided her education regarding this therapy.  She will undergo chemotherapy-teaching in the near future.  Consent will be ascertained the day of therapy following teaching.  We are awaiting approval from her insurance company for this immunotherapeutic intervention.  I personally reviewed and went over laboratory results with the patient.  The results are noted within this dictation.  I personally reviewed and went over radiographic studies with the patient.  The results are noted within this dictation.     Objective: Vital signs in last 24 hours: Temp:  [98.5 F (36.9 C)-103.1 F (39.5 C)] 98.5 F (36.9 C) (04/06 0634) Pulse Rate:  [70-114] 70 (04/06 0634) Resp:  [18-20] 18 (04/06 0634) BP: (98-138)/(58-61) 138/61 mmHg (04/06 0634) SpO2:  [100 %] 100 % (04/06 0634)  Intake/Output from previous day: 04/05 0800 - 04/06 0759 In: 103 [I.V.:3; IV Piggyback:100] Out: 5050 [Urine:5050] Intake/Output this shift: Total I/O In: -  Out: 1000 [Urine:1000]  General appearance: alert, cooperative, appears stated age, no distress and alopecia  Lab Results:   Recent Labs  02/02/14 0627 02/03/14 0611  WBC 9.1 9.7  HGB 8.2* 8.3*  HCT 23.5* 24.2*  PLT 106* 123*   BMET  Recent Labs  02/02/14 0627 02/03/14 0611  NA 136* 136*  K 3.4* 3.2*  CL 98 96  CO2 27 28  GLUCOSE 132* 147*  BUN 17 15  CREATININE 0.51 0.49*  CALCIUM 7.9* 8.0*    Studies/Results: No results found.  Medications: I have reviewed the patient's current medications.  Assessment/Plan: 1. 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  Nivolumab salvage therapy scheduled for Wednesday at 10:45 AM 2. History of breast cancer in 2002-2003 3. Terminal prognosis but  completely cogent and able to carry on a highly sophisticated discussion about her disease extent and its ramifications.  4. Paraplegia secondary to interval-medullary metastases.  5. Gram negative urosepsis , repeat cultures pending 6. DNR  Patient and plan discussed with Dr. Farrel Gobble and he is in agreement with the aforementioned.     LOS: 5 days    KEFALAS,THOMAS 02/03/2014

## 2014-02-04 ENCOUNTER — Other Ambulatory Visit (HOSPITAL_COMMUNITY): Payer: Self-pay | Admitting: Oncology

## 2014-02-04 NOTE — Telephone Encounter (Signed)
I have tried to call Anderson Malta at [facility] imaging, no answer.  This needs to go to [facility] who does all the authorizations for patient's MRIs.

## 2014-02-05 ENCOUNTER — Non-Acute Institutional Stay (SKILLED_NURSING_FACILITY): Payer: Medicaid Other | Admitting: Internal Medicine

## 2014-02-05 ENCOUNTER — Encounter (HOSPITAL_COMMUNITY): Payer: Medicaid Other | Attending: Hematology and Oncology

## 2014-02-05 ENCOUNTER — Encounter (HOSPITAL_COMMUNITY): Payer: Medicaid Other

## 2014-02-05 VITALS — BP 118/74 | HR 87 | Temp 98.2°F | Resp 18

## 2014-02-05 DIAGNOSIS — C7951 Secondary malignant neoplasm of bone: Secondary | ICD-10-CM

## 2014-02-05 DIAGNOSIS — C801 Malignant (primary) neoplasm, unspecified: Secondary | ICD-10-CM

## 2014-02-05 DIAGNOSIS — R339 Retention of urine, unspecified: Secondary | ICD-10-CM

## 2014-02-05 DIAGNOSIS — C3490 Malignant neoplasm of unspecified part of unspecified bronchus or lung: Secondary | ICD-10-CM

## 2014-02-05 DIAGNOSIS — Z5111 Encounter for antineoplastic chemotherapy: Secondary | ICD-10-CM

## 2014-02-05 DIAGNOSIS — C787 Secondary malignant neoplasm of liver and intrahepatic bile duct: Secondary | ICD-10-CM

## 2014-02-05 DIAGNOSIS — C7952 Secondary malignant neoplasm of bone marrow: Secondary | ICD-10-CM

## 2014-02-05 DIAGNOSIS — G8220 Paraplegia, unspecified: Secondary | ICD-10-CM

## 2014-02-05 LAB — TSH: TSH: 8.53 u[IU]/mL — AB (ref 0.350–4.500)

## 2014-02-05 LAB — COMPREHENSIVE METABOLIC PANEL
ALK PHOS: 260 U/L — AB (ref 39–117)
ALT: 96 U/L — ABNORMAL HIGH (ref 0–35)
AST: 61 U/L — ABNORMAL HIGH (ref 0–37)
Albumin: 2.6 g/dL — ABNORMAL LOW (ref 3.5–5.2)
BILIRUBIN TOTAL: 0.6 mg/dL (ref 0.3–1.2)
BUN: 12 mg/dL (ref 6–23)
CHLORIDE: 93 meq/L — AB (ref 96–112)
CO2: 29 meq/L (ref 19–32)
Calcium: 8.2 mg/dL — ABNORMAL LOW (ref 8.4–10.5)
Creatinine, Ser: 0.45 mg/dL — ABNORMAL LOW (ref 0.50–1.10)
GFR calc Af Amer: >90 mL/min (ref >90)
GFR calc non Af Amer: >90 mL/min (ref >90)
Glucose, Bld: 144 mg/dL — ABNORMAL HIGH (ref 70–99)
Potassium: 4 mEq/L (ref 3.7–5.3)
Sodium: 135 mEq/L — ABNORMAL LOW (ref 137–147)
Total Protein: 6.4 g/dL (ref 6.0–8.3)

## 2014-02-05 LAB — CBC WITH DIFFERENTIAL/PLATELET
Basophils Absolute: 0 10*3/uL (ref 0.0–0.1)
Basophils Relative: 0 % (ref 0–1)
EOS PCT: 0 % (ref 0–5)
Eosinophils Absolute: 0 10*3/uL (ref 0.0–0.7)
HEMATOCRIT: 29.8 % — AB (ref 36.0–46.0)
Hemoglobin: 9.9 g/dL — ABNORMAL LOW (ref 12.0–15.0)
LYMPHS ABS: 1.5 10*3/uL (ref 0.7–4.0)
Lymphocytes Relative: 11 % — ABNORMAL LOW (ref 12–46)
MCH: 27.2 pg (ref 26.0–34.0)
MCHC: 33.2 g/dL (ref 30.0–36.0)
MCV: 81.9 fL (ref 78.0–100.0)
Monocytes Absolute: 0.7 10*3/uL (ref 0.1–1.0)
Monocytes Relative: 5 % (ref 3–12)
NEUTROS ABS: 11 10*3/uL — AB (ref 1.7–7.7)
Neutrophils Relative %: 84 % — ABNORMAL HIGH (ref 43–77)
Platelets: 134 10*3/uL — ABNORMAL LOW (ref 150–400)
RBC: 3.64 MIL/uL — AB (ref 3.87–5.11)
RDW: 16 % — AB (ref 11.5–15.5)
WBC: 13.2 10*3/uL — ABNORMAL HIGH (ref 4.0–10.5)

## 2014-02-05 MED ORDER — HEPARIN SOD (PORK) LOCK FLUSH 100 UNIT/ML IV SOLN
500.0000 [IU] | Freq: Once | INTRAVENOUS | Status: AC | PRN
  Administered 2014-02-05: 500 [IU]

## 2014-02-05 MED ORDER — SODIUM CHLORIDE 0.9 % IJ SOLN: 10.0000 mL | INTRAMUSCULAR | Status: DC | PRN

## 2014-02-05 MED ORDER — SODIUM CHLORIDE 0.9 % IV SOLN
Freq: Once | INTRAVENOUS | Status: AC
  Administered 2014-02-05: 11:00:00 via INTRAVENOUS

## 2014-02-05 MED ORDER — HEPARIN SOD (PORK) LOCK FLUSH 100 UNIT/ML IV SOLN
INTRAVENOUS | Status: AC
  Filled 2014-02-05: qty 5

## 2014-02-05 MED ORDER — INV-NIVOLUMAB CHEMO INJECTION 100 MG BMS CA209153
3.0000 mg/kg | Freq: Once | INTRAVENOUS | Status: AC
  Administered 2014-02-05: 143 mg via INTRAVENOUS
  Filled 2014-02-05: qty 0

## 2014-02-05 NOTE — Progress Notes (Signed)
Tolerated infusion well - nos/s of reaction.

## 2014-02-05 NOTE — Progress Notes (Signed)
Teaching done and consent signed for Opdiv[facility]. Schedule given to patient.

## 2014-02-05 NOTE — Progress Notes (Signed)
Patient ID: Debbie Larsen, female   DOB: [birth year], 63 y.o.   MRN: [id number] Facility; [facility] Chief complaint; admission to SNF post readmission to hospital from 4/1 through 4/6. 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 Status post chemotherapy and radiation to the right breast. She apparently according to her developed a right axilla mass sometime in 2014 this was surgically resected and she was initially treated as breast cancer per the patient. Repeat PET scan showed evidence for disease progression. The patient states she was driving and functionally well clearly until the middle of February. In the early part to middle part of March she started to develop progressive leg weakness difficulty walking poor oral intake and weight loss. She was admitted to hospital for this. Workup showed widespread metastatic carcinoma. She had brain and thoracic cord metastasis confirmed by MRI. She was seen by radiation oncology and was not felt there was a role for further radiation. She was treated with Depo-Medrol 40 mg IM. She also has brain metastasis, pulmonary metastasis liver metastasis and a soft tissue mass within the pelvis on the left the current disease is felt to be metastatic carcinoma of undetermined primary with metastasis to bones and brain. In spite of several recommendations for comfort mediated care she has wanted a second opinion and is going for chemotherapy today at the cancer center.  Shortly after her original admission here she was readmitted to hospital with high fever. Urine culture showed both Escherichia coli and Citrobacter Koseri. She originally came here with a Foley catheter whoever would appear during this most recent hospitalization she wanted in and out catheters being done. Per the staff they are reporting anything from 700 cc to  1300 cc of. She was last catheterized 4 hours ago at 425 cc. Her urine irretractable infection was treated with Cipro for 10 more days post discharge.  Past Medical History  Diagnosis Date  . 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 Hypothyroid   . Enlarged lymph nodes   . 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  . S/P radiation therapy     Right breast/regional lymph nodes/ 5040 cGy / 28 fractions/ bost of 1200 cGy/ 6 Fractions  . Status post chemotherapy     6 cycles of CMF completed on 12/12/2001  . Right shoulder pain     started late 2012  . Dry cough     intermittent  . SOB (shortness of breath)   . Use of letrozole (Femara) 03/14/2013  . Cholelithiasis   . Anemia   . BREAST CANCER 04/2001    s/p radiation therapy, chemotherapy and lumpectomy  . Metastasis to bone 03/13/13    PET scan results  . Metastasis to bone     breast primary  . Cancer     brain mets  . Lung cancer 10/08/2013  . 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 Stroke    Past Surgical History  Procedure Laterality Date  . Tonsillectomy    . Breast lumpectomy  04/2001    excision of L breast fibroadenoma by Dr.   . Breast lumpectomy  05/16/2001    Right axillary sentinel lymph node biopsy.  . Portacath placement  09/19/2012    Procedure: INSERTION PORT-A-CATH;  Surgeon:  Haywood Lasso, MD;  Location: [place];  Service: General;  Laterality: N/A;  . Lipoma excision Right 05/15/2013    Procedure: RIGHT AXILLARY LYMPH NODE DISSECTION;  Surgeon: Haywood Lasso, MD;  Location: [facility];  Service: General;  Laterality: Right;   Current Outpatient Prescriptions on File Prior to Visit  Medication Sig Dispense Refill  . acetaminophen (TYLENOL) 325 MG tablet Take 2 tablets (650 mg total) by mouth every 6 (six) hours as needed for moderate pain, fever or headache.      Marland Kitchen alum & mag hydroxide-simeth (MAALOX/MYLANTA)  200-200-20 MG/5ML suspension Take 15 mLs by mouth every 4 (four) hours as needed for indigestion or heartburn.  355 mL  0  . B Complex-C (B-COMPLEX WITH VITAMIN C) tablet Take 1 tablet by mouth daily.      . ciprofloxacin (CIPRO) 250 MG tablet Take 1 tablet (250 mg total) by mouth 2 (two) times daily.  20 tablet  0  . diazepam (DIAZEPAM INTENSOL) 5 MG/ML solution Take 1 mL (5 mg total) by mouth every 6 (six) hours as needed for anxiety (or slepp).  30 mL  0  . feeding supplement, RESOURCE BREEZE, (RESOURCE BREEZE) LIQD Take 1 Container by mouth 3 (three) times daily between meals.    0  . gabapentin (NEURONTIN) 100 MG capsule Take 1 capsule (100 mg total) by mouth 2 (two) times daily.      Marland Kitchen HYDROcodone-acetaminophen (NORCO/VICODIN) 5-325 MG per tablet Take 1-2 tablets by mouth every 4 (four) hours as needed for moderate pain.  30 tablet  0  . hydrocortisone (CORTEF) 10 MG tablet Take 2 tablets (20 mg total) by mouth 2 (two) times daily.  40 tablet  0  . levothyroxine (SYNTHROID, LEVOTHROID) 75 MCG tablet Take 1 tablet (75 mcg total) by mouth every evening.  30 tablet  3  . lidocaine-prilocaine (EMLA) cream Apply topically as needed. Apply to port 1-2 hours before procedure  30 g  0  . magnesium citrate SOLN Take 296 mLs (1 Bottle total) by mouth daily as needed for severe constipation.  195 mL    . methylPREDNISolone acetate (DEPO-MEDROL) 40 MG/ML injection Give 40 mg IM every 2 weeks, first dose 01/29/14.  1 mL    . Multiple Vitamin (MULTIVITAMIN WITH MINERALS) TABS Take 1 tablet by mouth daily.      . prochlorperazine (COMPAZINE) 10 MG tablet Take 1 tablet (10 mg total) by mouth every 6 (six) hours as needed for nausea or vomiting.  30 tablet  0  . senna-docusate (SENOKOT-S) 8.6-50 MG per tablet Take 2 tablets by mouth at bedtime as needed for mild constipation.      . sodium phosphate (FLEET) 7-19 GM/118ML ENEM Place 1 enema rectally daily as needed for severe constipation.    0   Current  Facility-Administered Medications on File Prior to Visit  Medication Dose Route Frequency Provider Last Rate Last Dose  . heparin lock flush 100 unit/mL  500 Units Intravenous Once Chauncey Cruel, MD      . sodium chloride 0.9 % injection 10 mL  10 mL Intravenous PRN Chauncey Cruel, MD      . sodium chloride 0.9 % injection 10 mL  10 mL Intravenous PRN Chauncey Cruel, MD   10 mL at 07/09/13 1717   Social history; the patient tells me she was living near was a long hospital. She was not currently working. 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 She is a DO NOT RESUSCITATE on the chart. She has no local family although she does have a son who lives in [place]. It is difficult at this point to see her returning to any form of independence  Review of systems Gen. reported to have a fever today of over 100 HEENT no sore throat Respiratory no cough no shortness of breath Cardiac no chest pain Abdomen no abdominal pain Musculoskeletal she has some complaints of back pain GU she has been receiving in and out caths Rectal claims that she has sensations of having to stool although she has not had a bowel movement in 4 days  Physical examination Gen. frail 63 year old woman not in overt distress HEENT oral exam is normal Breasts no masses detected in either breast Lymph none palpable in the cervical clavicular or axillary areas Respiratory clear entry bilaterally Cardiac heart sounds are normal she appears to be euvolemic Abdomen slightly distended but no liver or spleen clearly palpable. There is a large right-sided mass extending from the pelvis to the right of her umbilicus. This is not painful nor did she complain of having to void. Rectal moderate formed stool Neurologic; she has no movement in either leg. Claims to have full sensation bilaterally although I am not completely certain about this. She has absent knee and ankle jerks there is no  Babinski response  Impression/plan #1 widely metastatic adenocarcinoma of unknown primary. She is to receive Opdivo.She has mets to the thoracic spine with cord edema. She is paraplegic. Claims to have lower extremity sensation. On hydrocortisone 20 bid?? And dpeomedrol 40mg  IM q 2 weeks.  2) urinary retention cathed this am for 1500 cc. Will leave foley in place for now. Pelvic mass resolved.  3) neurogenic bowel 4)Recent UTI on cipro 5 temp >100 ?source of infection vs tumor. 6 hypothyroidism on replacement  I am going to leave a Foley catheter in place for now.

## 2014-02-05 NOTE — Telephone Encounter (Signed)
Please advise 

## 2014-02-06 ENCOUNTER — Encounter (HOSPITAL_COMMUNITY): Payer: Self-pay

## 2014-02-06 LAB — CULTURE, BLOOD (ROUTINE X 2)
CULTURE: NO GROWTH
CULTURE: NO GROWTH

## 2014-02-07 ENCOUNTER — Other Ambulatory Visit (HOSPITAL_COMMUNITY): Payer: Self-pay | Admitting: Hematology and Oncology

## 2014-02-07 MED ORDER — NIVOLUMAB CHEMO INJECTION 100 MG/10ML: 3.0000 mg/kg | Freq: Once | INTRAVENOUS | Status: DC

## 2014-02-07 NOTE — Addendum Note (Signed)
Addended by: Gildardo Griffes B on: 02/07/2014 11:24 AM   Modules accepted: Orders

## 2014-02-10 ENCOUNTER — Other Ambulatory Visit (HOSPITAL_COMMUNITY): Payer: Self-pay | Admitting: Hematology and Oncology

## 2014-02-11 ENCOUNTER — Other Ambulatory Visit (HOSPITAL_COMMUNITY): Payer: Self-pay | Admitting: Hematology and Oncology

## 2014-02-12 ENCOUNTER — Telehealth (HOSPITAL_COMMUNITY): Payer: Self-pay | Admitting: *Deleted

## 2014-02-12 ENCOUNTER — Other Ambulatory Visit (HOSPITAL_COMMUNITY): Payer: Self-pay | Admitting: Hematology and Oncology

## 2014-02-12 ENCOUNTER — Encounter: Payer: Self-pay | Admitting: *Deleted

## 2014-02-12 DIAGNOSIS — C7951 Secondary malignant neoplasm of bone: Secondary | ICD-10-CM

## 2014-02-14 ENCOUNTER — Emergency Department (HOSPITAL_COMMUNITY): Payer: Self-pay

## 2014-02-14 ENCOUNTER — Encounter: Payer: Self-pay | Admitting: Internal Medicine

## 2014-02-14 ENCOUNTER — Non-Acute Institutional Stay (SKILLED_NURSING_FACILITY): Payer: Medicaid Other | Admitting: Internal Medicine

## 2014-02-14 ENCOUNTER — Emergency Department (HOSPITAL_COMMUNITY): Payer: Medicaid Other

## 2014-02-14 ENCOUNTER — Other Ambulatory Visit: Payer: Self-pay

## 2014-02-14 ENCOUNTER — Encounter (HOSPITAL_COMMUNITY): Payer: Self-pay | Admitting: Emergency Medicine

## 2014-02-14 ENCOUNTER — Encounter (HOSPITAL_BASED_OUTPATIENT_CLINIC_OR_DEPARTMENT_OTHER): Payer: Medicaid Other

## 2014-02-14 ENCOUNTER — Inpatient Hospital Stay (HOSPITAL_COMMUNITY)
Admission: EM | Admit: 2014-02-14 | Discharge: 2014-02-20 | DRG: 643 | Disposition: A | Discharge status: Skilled Nursing Facility | Payer: Medicaid Other | Attending: Internal Medicine | Admitting: Internal Medicine

## 2014-02-14 VITALS — BP 116/62 | HR 97 | Temp 98.5°F | Resp 20

## 2014-02-14 DIAGNOSIS — R509 Fever, unspecified: Secondary | ICD-10-CM

## 2014-02-14 DIAGNOSIS — C7952 Secondary malignant neoplasm of bone marrow: Secondary | ICD-10-CM

## 2014-02-14 DIAGNOSIS — C3490 Malignant neoplasm of unspecified part of unspecified bronchus or lung: Secondary | ICD-10-CM

## 2014-02-14 DIAGNOSIS — E039 Hypothyroidism, unspecified: Secondary | ICD-10-CM | POA: Diagnosis present

## 2014-02-14 DIAGNOSIS — C801 Malignant (primary) neoplasm, unspecified: Secondary | ICD-10-CM

## 2014-02-14 DIAGNOSIS — R5381 Other malaise: Secondary | ICD-10-CM

## 2014-02-14 DIAGNOSIS — Z8249 Family history of ischemic heart disease and other diseases of the circulatory system: Secondary | ICD-10-CM

## 2014-02-14 DIAGNOSIS — G9341 Metabolic encephalopathy: Secondary | ICD-10-CM | POA: Diagnosis present

## 2014-02-14 DIAGNOSIS — Z833 Family history of diabetes mellitus: Secondary | ICD-10-CM

## 2014-02-14 DIAGNOSIS — C7931 Secondary malignant neoplasm of brain: Secondary | ICD-10-CM | POA: Diagnosis present

## 2014-02-14 DIAGNOSIS — M8580 Other specified disorders of bone density and structure, unspecified site: Secondary | ICD-10-CM

## 2014-02-14 DIAGNOSIS — Z780 Asymptomatic menopausal state: Secondary | ICD-10-CM

## 2014-02-14 DIAGNOSIS — N390 Urinary tract infection, site not specified: Secondary | ICD-10-CM

## 2014-02-14 DIAGNOSIS — Z923 Personal history of irradiation: Secondary | ICD-10-CM

## 2014-02-14 DIAGNOSIS — C7951 Secondary malignant neoplasm of bone: Secondary | ICD-10-CM

## 2014-02-14 DIAGNOSIS — T380X5A Adverse effect of glucocorticoids and synthetic analogues, initial encounter: Secondary | ICD-10-CM | POA: Diagnosis present

## 2014-02-14 DIAGNOSIS — R42 Dizziness and giddiness: Secondary | ICD-10-CM

## 2014-02-14 DIAGNOSIS — C50919 Malignant neoplasm of unspecified site of unspecified female breast: Secondary | ICD-10-CM

## 2014-02-14 DIAGNOSIS — R4182 Altered mental status, unspecified: Secondary | ICD-10-CM | POA: Diagnosis present

## 2014-02-14 DIAGNOSIS — IMO0002 Reserved for concepts with insufficient information to code with codable children: Secondary | ICD-10-CM

## 2014-02-14 DIAGNOSIS — E43 Unspecified severe protein-calorie malnutrition: Secondary | ICD-10-CM | POA: Diagnosis present

## 2014-02-14 DIAGNOSIS — D649 Anemia, unspecified: Secondary | ICD-10-CM

## 2014-02-14 DIAGNOSIS — E871 Hypo-osmolality and hyponatremia: Secondary | ICD-10-CM

## 2014-02-14 DIAGNOSIS — Z8673 Personal history of transient ischemic attack (TIA), and cerebral infarction without residual deficits: Secondary | ICD-10-CM

## 2014-02-14 DIAGNOSIS — R531 Weakness: Secondary | ICD-10-CM | POA: Diagnosis present

## 2014-02-14 DIAGNOSIS — Z853 Personal history of malignant neoplasm of breast: Secondary | ICD-10-CM

## 2014-02-14 DIAGNOSIS — R5383 Other fatigue: Secondary | ICD-10-CM

## 2014-02-14 DIAGNOSIS — Z7401 Bed confinement status: Secondary | ICD-10-CM

## 2014-02-14 DIAGNOSIS — R82998 Other abnormal findings in urine: Secondary | ICD-10-CM | POA: Diagnosis present

## 2014-02-14 DIAGNOSIS — E559 Vitamin D deficiency, unspecified: Secondary | ICD-10-CM

## 2014-02-14 DIAGNOSIS — C787 Secondary malignant neoplasm of liver and intrahepatic bile duct: Secondary | ICD-10-CM | POA: Diagnosis present

## 2014-02-14 DIAGNOSIS — R748 Abnormal levels of other serum enzymes: Secondary | ICD-10-CM

## 2014-02-14 DIAGNOSIS — F411 Generalized anxiety disorder: Secondary | ICD-10-CM

## 2014-02-14 DIAGNOSIS — R7303 Prediabetes: Secondary | ICD-10-CM

## 2014-02-14 DIAGNOSIS — R5081 Fever presenting with conditions classified elsewhere: Secondary | ICD-10-CM

## 2014-02-14 DIAGNOSIS — C7800 Secondary malignant neoplasm of unspecified lung: Secondary | ICD-10-CM | POA: Diagnosis present

## 2014-02-14 DIAGNOSIS — A419 Sepsis, unspecified organism: Secondary | ICD-10-CM

## 2014-02-14 DIAGNOSIS — Z66 Do not resuscitate: Secondary | ICD-10-CM

## 2014-02-14 DIAGNOSIS — L8995 Pressure ulcer of unspecified site, unstageable: Secondary | ICD-10-CM | POA: Diagnosis present

## 2014-02-14 DIAGNOSIS — D638 Anemia in other chronic diseases classified elsewhere: Secondary | ICD-10-CM | POA: Diagnosis present

## 2014-02-14 DIAGNOSIS — I639 Cerebral infarction, unspecified: Secondary | ICD-10-CM

## 2014-02-14 DIAGNOSIS — C7949 Secondary malignant neoplasm of other parts of nervous system: Secondary | ICD-10-CM | POA: Diagnosis present

## 2014-02-14 DIAGNOSIS — E236 Other disorders of pituitary gland: Principal | ICD-10-CM | POA: Diagnosis present

## 2014-02-14 DIAGNOSIS — I69398 Other sequelae of cerebral infarction: Secondary | ICD-10-CM

## 2014-02-14 DIAGNOSIS — G8220 Paraplegia, unspecified: Secondary | ICD-10-CM | POA: Diagnosis present

## 2014-02-14 DIAGNOSIS — Z9221 Personal history of antineoplastic chemotherapy: Secondary | ICD-10-CM

## 2014-02-14 DIAGNOSIS — L89109 Pressure ulcer of unspecified part of back, unspecified stage: Secondary | ICD-10-CM | POA: Diagnosis present

## 2014-02-14 DIAGNOSIS — M25511 Pain in right shoulder: Secondary | ICD-10-CM

## 2014-02-14 DIAGNOSIS — D696 Thrombocytopenia, unspecified: Secondary | ICD-10-CM | POA: Diagnosis present

## 2014-02-14 LAB — CBC WITH DIFFERENTIAL/PLATELET
BASOS ABS: 0 10*3/uL (ref 0.0–0.1)
Basophils Relative: 0 % (ref 0–1)
EOS ABS: 0 10*3/uL (ref 0.0–0.7)
Eosinophils Relative: 0 % (ref 0–5)
HCT: 25.7 % — ABNORMAL LOW (ref 36.0–46.0)
Hemoglobin: 9.6 g/dL — ABNORMAL LOW (ref 12.0–15.0)
LYMPHS PCT: 12 % (ref 12–46)
Lymphs Abs: 0.8 10*3/uL (ref 0.7–4.0)
MCH: 27.7 pg (ref 26.0–34.0)
MCHC: 37.4 g/dL — AB (ref 30.0–36.0)
MCV: 74.3 fL — ABNORMAL LOW (ref 78.0–100.0)
Monocytes Absolute: 0.3 10*3/uL (ref 0.1–1.0)
Monocytes Relative: 4 % (ref 3–12)
NEUTROS ABS: 5.7 10*3/uL (ref 1.7–7.7)
NEUTROS PCT: 84 % — AB (ref 43–77)
Platelets: 109 10*3/uL — ABNORMAL LOW (ref 150–400)
RBC: 3.46 MIL/uL — ABNORMAL LOW (ref 3.87–5.11)
RDW: 15.5 % (ref 11.5–15.5)
WBC: 6.8 10*3/uL (ref 4.0–10.5)

## 2014-02-14 LAB — URINALYSIS, ROUTINE W REFLEX MICROSCOPIC
Bilirubin Urine: NEGATIVE
Glucose, UA: 100 mg/dL — AB
Ketones, ur: NEGATIVE mg/dL
NITRITE: NEGATIVE
Protein, ur: NEGATIVE mg/dL
UROBILINOGEN UA: 0.2 mg/dL (ref 0.0–1.0)
pH: 5.5 (ref 5.0–8.0)

## 2014-02-14 LAB — URINE MICROSCOPIC-ADD ON

## 2014-02-14 LAB — COMPREHENSIVE METABOLIC PANEL
ALBUMIN: 2.9 g/dL — AB (ref 3.5–5.2)
ALK PHOS: 376 U/L — AB (ref 39–117)
ALT: 49 U/L — ABNORMAL HIGH (ref 0–35)
AST: 40 U/L — ABNORMAL HIGH (ref 0–37)
BUN: 6 mg/dL (ref 6–23)
CALCIUM: 7.5 mg/dL — AB (ref 8.4–10.5)
CO2: 19 mEq/L (ref 19–32)
Chloride: 69 mEq/L — ABNORMAL LOW (ref 96–112)
Creatinine, Ser: <0.2 mg/dL — ABNORMAL LOW (ref 0.50–1.10)
Glucose, Bld: 182 mg/dL — ABNORMAL HIGH (ref 70–99)
POTASSIUM: 3.6 meq/L — AB (ref 3.7–5.3)
SODIUM: 103 meq/L — AB (ref 137–147)
Total Bilirubin: 1.1 mg/dL (ref 0.3–1.2)
Total Protein: 6.7 g/dL (ref 6.0–8.3)

## 2014-02-14 LAB — I-STAT CHEM 8, ED
BUN: 3 mg/dL — AB (ref 6–23)
CALCIUM ION: 0.96 mmol/L — AB (ref 1.13–1.30)
Chloride: 74 mEq/L — ABNORMAL LOW (ref 96–112)
Creatinine, Ser: 0.2 mg/dL — ABNORMAL LOW (ref 0.50–1.10)
Glucose, Bld: 171 mg/dL — ABNORMAL HIGH (ref 70–99)
HCT: 30 % — ABNORMAL LOW (ref 36.0–46.0)
HEMOGLOBIN: 10.2 g/dL — AB (ref 12.0–15.0)
Potassium: 3.5 mEq/L — ABNORMAL LOW (ref 3.7–5.3)
SODIUM: 103 meq/L — AB (ref 137–147)
TCO2: 18 mmol/L (ref 0–100)

## 2014-02-14 LAB — PRO B NATRIURETIC PEPTIDE: PRO B NATRI PEPTIDE: 263.1 pg/mL — AB (ref 0–125)

## 2014-02-14 LAB — TROPONIN I: Troponin I: <0.3 ng/mL (ref <0.30)

## 2014-02-14 MED ORDER — SODIUM CHLORIDE 0.9 % IV SOLN
INTRAVENOUS | Status: DC
  Administered 2014-02-14 – 2014-02-15 (×2): via INTRAVENOUS

## 2014-02-14 MED ORDER — IPRATROPIUM BROMIDE 0.02 % IN SOLN: 0.5000 mg | Freq: Four times a day (QID) | RESPIRATORY_TRACT | Status: DC

## 2014-02-14 MED ORDER — B COMPLEX-C PO TABS
1.0000 | ORAL_TABLET | Freq: Every day | ORAL | Status: DC
  Administered 2014-02-15 – 2014-02-20 (×5): 1 via ORAL
  Filled 2014-02-14 (×8): qty 1

## 2014-02-14 MED ORDER — CALCIUM CARBONATE-VITAMIN D 500-200 MG-UNIT PO TABS: 2.0000 | ORAL_TABLET | Freq: Every day | ORAL | Status: AC

## 2014-02-14 MED ORDER — ZOLEDRONIC ACID 4 MG/5ML IV CONC
4.0000 mg | Freq: Once | INTRAVENOUS | Status: DC
  Filled 2014-02-14 (×2): qty 5

## 2014-02-14 MED ORDER — BOOST / RESOURCE BREEZE PO LIQD
1.0000 | Freq: Three times a day (TID) | ORAL | Status: DC
  Administered 2014-02-15 – 2014-02-20 (×9): 1 via ORAL

## 2014-02-14 MED ORDER — SODIUM CHLORIDE 0.9 % IJ SOLN
10.0000 mL | INTRAMUSCULAR | Status: DC | PRN
  Administered 2014-02-14: 10 mL

## 2014-02-14 MED ORDER — ACETAMINOPHEN 325 MG PO TABS
650.0000 mg | ORAL_TABLET | Freq: Four times a day (QID) | ORAL | Status: DC | PRN
  Administered 2014-02-16: 650 mg via ORAL
  Filled 2014-02-14: qty 2

## 2014-02-14 MED ORDER — MAGNESIUM CITRATE PO SOLN: 1.0000 | Freq: Every day | ORAL | Status: DC | PRN

## 2014-02-14 MED ORDER — FLEET ENEMA 7-19 GM/118ML RE ENEM: 1.0000 | ENEMA | Freq: Every day | RECTAL | Status: DC | PRN

## 2014-02-14 MED ORDER — HYDROCODONE-ACETAMINOPHEN 5-325 MG PO TABS
1.0000 | ORAL_TABLET | ORAL | Status: DC | PRN
  Administered 2014-02-14: 1 via ORAL
  Filled 2014-02-14: qty 1

## 2014-02-14 MED ORDER — SODIUM CHLORIDE 0.9 % IV SOLN
Freq: Once | INTRAVENOUS | Status: AC
  Administered 2014-02-14: 11:00:00 via INTRAVENOUS

## 2014-02-14 MED ORDER — IPRATROPIUM-ALBUTEROL 0.5-2.5 (3) MG/3ML IN SOLN
3.0000 mL | Freq: Four times a day (QID) | RESPIRATORY_TRACT | Status: DC
  Administered 2014-02-15 – 2014-02-20 (×17): 3 mL via RESPIRATORY_TRACT
  Filled 2014-02-14 (×18): qty 3

## 2014-02-14 MED ORDER — SODIUM CHLORIDE 0.9 % IV BOLUS (SEPSIS)
500.0000 mL | Freq: Once | INTRAVENOUS | Status: AC
  Administered 2014-02-14: 500 mL via INTRAVENOUS

## 2014-02-14 MED ORDER — HEPARIN SOD (PORK) LOCK FLUSH 100 UNIT/ML IV SOLN
500.0000 [IU] | Freq: Once | INTRAVENOUS | Status: AC | PRN
  Administered 2014-02-14: 500 [IU]
  Filled 2014-02-14: qty 5

## 2014-02-14 MED ORDER — LEVOTHYROXINE SODIUM 75 MCG PO TABS: 75.0000 ug | ORAL_TABLET | Freq: Every evening | ORAL | Status: DC

## 2014-02-14 MED ORDER — GABAPENTIN 100 MG PO CAPS
100.0000 mg | ORAL_CAPSULE | Freq: Two times a day (BID) | ORAL | Status: DC
  Administered 2014-02-14 – 2014-02-20 (×12): 100 mg via ORAL
  Filled 2014-02-14 (×12): qty 1

## 2014-02-14 MED ORDER — CALCIUM CARBONATE-VITAMIN D 500-200 MG-UNIT PO TABS
2.0000 | ORAL_TABLET | Freq: Every day | ORAL | Status: DC
  Administered 2014-02-15 – 2014-02-20 (×6): 2 via ORAL
  Filled 2014-02-14 (×7): qty 2

## 2014-02-14 MED ORDER — ZOLEDRONIC ACID 4 MG/5ML IV CONC
4.0000 mg | Freq: Once | INTRAVENOUS | Status: AC
  Administered 2014-02-14: 4 mg via INTRAVENOUS
  Filled 2014-02-14: qty 5

## 2014-02-14 MED ORDER — PROCHLORPERAZINE MALEATE 5 MG PO TABS: 10.0000 mg | ORAL_TABLET | Freq: Four times a day (QID) | ORAL | Status: DC | PRN

## 2014-02-14 MED ORDER — ALBUTEROL SULFATE (2.5 MG/3ML) 0.083% IN NEBU
2.5000 mg | INHALATION_SOLUTION | RESPIRATORY_TRACT | Status: DC | PRN
  Administered 2014-02-16: 2.5 mg via RESPIRATORY_TRACT
  Filled 2014-02-14: qty 3

## 2014-02-14 MED ORDER — DIAZEPAM 5 MG PO TABS
5.0000 mg | ORAL_TABLET | Freq: Four times a day (QID) | ORAL | Status: DC | PRN
  Administered 2014-02-14: 5 mg via ORAL
  Filled 2014-02-14 (×3): qty 1

## 2014-02-14 MED ORDER — SENNOSIDES-DOCUSATE SODIUM 8.6-50 MG PO TABS
2.0000 | ORAL_TABLET | Freq: Every evening | ORAL | Status: DC | PRN
  Administered 2014-02-16: 2 via ORAL
  Filled 2014-02-14: qty 2

## 2014-02-14 MED ORDER — ALUM & MAG HYDROXIDE-SIMETH 200-200-20 MG/5ML PO SUSP: 15.0000 mL | ORAL | Status: DC | PRN

## 2014-02-14 MED ORDER — ADULT MULTIVITAMIN W/MINERALS CH
1.0000 | ORAL_TABLET | Freq: Every day | ORAL | Status: DC
  Administered 2014-02-15 – 2014-02-20 (×6): 1 via ORAL
  Filled 2014-02-14 (×6): qty 1

## 2014-02-14 MED ORDER — ALBUTEROL SULFATE (2.5 MG/3ML) 0.083% IN NEBU: 2.5000 mg | INHALATION_SOLUTION | Freq: Four times a day (QID) | RESPIRATORY_TRACT | Status: DC

## 2014-02-14 MED ORDER — HYDROCORTISONE 20 MG PO TABS
20.0000 mg | ORAL_TABLET | Freq: Two times a day (BID) | ORAL | Status: DC
Start: 2014-02-14 — End: 2014-02-20
  Administered 2014-02-15 – 2014-02-20 (×10): 20 mg via ORAL
  Filled 2014-02-14 (×15): qty 1

## 2014-02-14 NOTE — H&P (Signed)
PCP:   Melrose Nakayama, MD   Chief Complaint:  Confused, sob  HPI: 63 yo female with h/o unk cancer with diffuse metastatic disease adenocarcinoma dx about 6 months ago sent in from snf for mild confusion and sob.  No fevers.  Pt has disease in lung  And is always sob.  No n/v/d.  Not eating well.  On labs her na is 28.  Pt is mentating very well, actually surprised at how clear her mental status is right now with this degree of hyponatremia.  In the last 2 weeks her na level was normal.  She is able to answer all questions and is oriented x 3.  She has paraplegia from a spinal lesion, which is suppose to be getting radiation but hasnt started yet.  Her last chemo immuno infusion was 2 weeks ago.  She is DNR.  Review of Systems:  Positive and negative as per HPI otherwise all other systems are negative  Past Medical History: Past Medical History  Diagnosis Date  . 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 Hypothyroid   . Enlarged lymph nodes   . 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  . S/P radiation therapy 12/26/01- 02/12/02     Right breast/regional lymph nodes/ 5040 cGy / 28 fractions/ bost of 1200 cGy/ 6 Fractions  . Status post chemotherapy     6 cycles of CMF completed on 12/12/2001  . Right shoulder pain     started late 2012  . Dry cough     intermittent  . SOB (shortness of breath)   . Use of letrozole (Femara) 03/14/2013  . Cholelithiasis   . Anemia   . BREAST CANCER 04/2001    s/p radiation therapy, chemotherapy and lumpectomy  . Metastasis to bone 03/13/13    PET scan results  . Metastasis to bone     breast primary  . Cancer     brain mets  . Lung cancer 10/08/2013  . 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 Stroke   . Hx of radiation therapy 12/25/13- 01/09/14    whole brain 3000 cGy in 10 fractions   Past Surgical History  Procedure Laterality Date  .  Tonsillectomy    . Breast lumpectomy  04/2001    excision of L breast fibroadenoma by Dr.   . Breast lumpectomy  05/16/2001    Right axillary sentinel lymph node biopsy.  . Portacath placement  09/19/2012    Procedure: INSERTION PORT-A-CATH;  Surgeon: Haywood Lasso, MD;  Location: [place];  Service: General;  Laterality: N/A;  . Lipoma excision Right 05/15/2013    Procedure: RIGHT AXILLARY LYMPH NODE DISSECTION;  Surgeon: Haywood Lasso, MD;  Location: [facility];  Service: General;  Laterality: Right;    Medications: Prior to Admission medications   Medication Sig Start Date End Date Taking? Authorizing Provider  acetaminophen (TYLENOL) 325 MG tablet Take 2 tablets (650 mg total) by mouth every 6 (six) hours as needed for moderate pain, fever or headache. 01/28/14  Yes Christina P Rama, MD  B Complex-C (B-COMPLEX WITH VITAMIN C) tablet Take 1 tablet by mouth daily.   Yes Historical Provider, MD  calcium-vitamin D (OSCAL WITH D) 500-200 MG-UNIT per tablet Take 2 tablets by mouth daily with breakfast. 02/14/14  Yes Baird Cancer, PA-C  diazepam (DIAZEPAM INTENSOL) 5 MG/ML solution Take 1 mL (5 mg total) by  mouth every 6 (six) hours as needed for anxiety (or slepp). 02/03/14  Yes Theodis Blaze, MD  feeding supplement, RESOURCE BREEZE, (RESOURCE BREEZE) LIQD Take 1 Container by mouth 3 (three) times daily between meals. 01/28/14  Yes Christina P Rama, MD  gabapentin (NEURONTIN) 100 MG capsule Take 1 capsule (100 mg total) by mouth 2 (two) times daily. 01/28/14  Yes Venetia Maxon Rama, MD  HYDROcodone-acetaminophen (NORCO/VICODIN) 5-325 MG per tablet Take 1 tablet by mouth every 4 (four) hours as needed for moderate pain.   Yes Historical Provider, MD  hydrocortisone (CORTEF) 10 MG tablet Take 2 tablets (20 mg total) by mouth 2 (two) times daily. 02/03/14  Yes Theodis Blaze, MD  levothyroxine (SYNTHROID, LEVOTHROID) 75 MCG tablet Take 1 tablet (75 mcg total) by mouth every evening. 11/22/13  Yes Andrena Mews, MD  lidocaine-prilocaine (EMLA) cream Apply topically as needed. Apply to port 1-2 hours before procedure 09/18/12  Yes Eston Esters, MD  magnesium citrate SOLN Take 296 mLs (1 Bottle total) by mouth daily as needed for severe constipation. 01/28/14  Yes Venetia Maxon Rama, MD  methylPREDNISolone acetate (DEPO-MEDROL) 40 MG/ML injection Give 40 mg IM every 2 weeks, first dose 01/29/14. 01/28/14  Yes Christina P Rama, MD  Multiple Vitamin (MULTIVITAMIN WITH MINERALS) TABS Take 1 tablet by mouth daily.   Yes Historical Provider, MD  prochlorperazine (COMPAZINE) 10 MG tablet Take 1 tablet (10 mg total) by mouth every 6 (six) hours as needed for nausea or vomiting. 02/03/14  Yes Theodis Blaze, MD  senna-docusate (SENOKOT-S) 8.6-50 MG per tablet Take 2 tablets by mouth at bedtime as needed for mild constipation. 01/28/14  Yes Christina P Rama, MD  sodium phosphate (FLEET) 7-19 GM/118ML ENEM Place 1 enema rectally daily as needed for severe constipation. 01/28/14  Yes Venetia Maxon Rama, MD  alum & mag hydroxide-simeth (MAALOX/MYLANTA) 200-200-20 MG/5ML suspension Take 15 mLs by mouth every 4 (four) hours as needed for indigestion or heartburn. 01/28/14   Venetia Maxon Rama, MD    Allergies:  No Known Allergies  Social History:  reports that she has never smoked. She has never used smokeless tobacco. She reports that she does not drink alcohol or use illicit drugs.  Family History: Family History  Problem Relation Age of Onset  . Diabetes Mother   . Hypertension Father   . Hypertension Sister   . Hypothyroidism Brother   . Cancer Neg Hx     Physical Exam: Filed Vitals:   02/14/14 1851 02/14/14 2037 02/14/14 2109  BP: 142/107 151/102 119/104  Pulse: 95 72 96  Temp: 98 F (36.7 C)    TempSrc: Oral  Oral  Resp: 30 22 21   Weight: 47.174 kg (104 lb)    SpO2: 100% 95%    General appearance: alert, cooperative and no distress Head: Normocephalic, without obvious abnormality, atraumatic Eyes:  negative Nose: Nares normal. Septum midline. Mucosa normal. No drainage or sinus tenderness. Neck: no JVD and supple, symmetrical, trachea midline Lungs: clear to auscultation bilaterally Heart: regular rate and rhythm, S1, S2 normal, no murmur, click, rub or gallop Abdomen: soft, non-tender; bowel sounds normal; no masses,  no organomegaly Extremities: extremities normal, atraumatic, no cyanosis or edema Pulses: 2+ and symmetric Skin: Skin color, texture, turgor normal. No rashes or lesions Neurologic: Grossly normal except 0/5 stength in ble (old)    Labs on Admission:   Recent Labs  02/14/14 1946  NA 103*  K 3.6*  CL 69*  CO2 19  GLUCOSE 182*  BUN 6  CREATININE <0.20*  CALCIUM 7.5*    Recent Labs  02/14/14 1946  AST 40*  ALT 49*  ALKPHOS 376*  BILITOT 1.1  PROT 6.7  ALBUMIN 2.9*    Recent Labs  02/14/14 1946  WBC 6.8  NEUTROABS 5.7  HGB 9.6*  HCT 25.7*  MCV 74.3*  PLT 109*    Recent Labs  02/14/14 1946  TROPONINI <0.30   Radiological Exams on Admission: Dg Chest 2 View  02/14/2014   CLINICAL DATA:  Shortness of breath, metastatic breast cancer.  EXAM: CHEST  2 VIEW  COMPARISON:  DG CHEST 1V PORT dated 01/29/2014; CT CHEST W/CM dated 01/14/2014; CT CHEST W/CM dated 07/03/2013  FINDINGS: Trachea is midline. Left subclavian power port tip is in the SVC. Heart size stable. Pulmonary parenchymal lesions seen on 01/14/2014 are poorly appreciated. No pleural fluid. Surgical clips in the right axilla.  IMPRESSION: Pulmonary parenchymal lesions seen on 01/14/2014 are poorly appreciated on the current exam. No large areas of airspace consolidation. No pleural fluid.   Electronically Signed   By: Lorin Picket M.D.   On: 02/14/2014 20:29   Mr Jeri Cos FI Contrast  01/20/2014   CLINICAL DATA:  Breast cancer.  Evaluate for metastatic disease.  EXAM: MRI HEAD WITHOUT AND WITH CONTRAST  TECHNIQUE: Multiplanar, multiecho pulse sequences of the brain and surrounding  structures were obtained without and with intravenous contrast.  CONTRAST:  MultiHance 9 mL.  COMPARISON:  MR HEAD WO/W CM dated 12/18/2013; MR C SPINE WO/W CM dated 02/21/2013  FINDINGS: No acute stroke or hemorrhage. No hydrocephalus or extra-axial fluid.  Intracranial metastatic disease is re-demonstrated, as was observed on the previous MR. Subcentimeter lesions are seen in the right cerebellar hemisphere, inferior vermis, right temporal lobe, left temporal lobe, and multiple lesions throughout the subcortical white matter of left greater than right cerebral hemispheres. The dominant lesion is an osseous metastasis to the greater wing of the sphenoid on the right, measures 17 x 19 mm, unchanged in size from priors. A dural based lesion is also seen over the right frontal convexity, similar to priors. No midline shift. No impending herniation. Moderate vasogenic edema is associated with the right temporal lesion, increased from priors, and could predispose the patient to seizures. Other lesions also are roughly similar, including a spherical 7 mm lesion deep to the insula. There is slight increased edema surrounding the right cerebellar lesion compared with priors, but there is slightly less edema surrounding the left parietal subcortical lesion. A few more lesions are visible than were previously noted on the 12/18/2013 study, but there was considerable motion on that exam.  Marrow heterogeneity in the clivus could suggest additional tumor involvement. Low T1 signal intensity bone marrow in the upper cervical region unchanged from priors.  IMPRESSION: Suspected slight progression of metastatic disease, with a few more lesions visible than were observed on 12/18/2013, as well as increased vasogenic edema surrounding the right middle cranial fossa osseous lesion, and right cerebellar lesion. No impending herniation or midline shift.  No lesions involving the parasagittal posterior frontal cortex which might cause  bilateral leg weakness.   Electronically Signed   By: Rolla Flatten M.D.   On: 01/20/2014 20:09   Mr Thoracic Spine W Contrast  01/21/2014   CLINICAL DATA:  63 year old female with stage IV breast cancer. New onset lower extremity weakness and unable to move the lower extremities. Initial encounter.  EXAM: MRI THORACIC SPINE WITH CONTRAST  TECHNIQUE:  Multiplanar and multiecho pulse sequences of the thoracic spine were obtained with intravenous contrast.  CONTRAST:  33mL MULTIHANCE GADOBENATE DIMEGLUMINE 529 MG/ML IV SOLN  COMPARISON:  Lumbar MRI 01/20/2014.  FINDINGS: Diffusely abnormal bone marrow signal. Subsequently, cervical vertebral delineation is difficult on the sagittal scout view. The numbering system here agrees with that on the recent lumbar exam.  Diffuse thoracic spine and widespread posterior rib metastases. Still, no thoracic epidural or extraosseous tumor identified.  Intra-axial partially exophytic solidly enhancing mass of the thoracic spinal cord centered at the T8 level measures 7 x 9 x 17 mm (AP by transverse by CC) and is associated with widespread thoracic spinal cord edema. Only a portion of the conus medullaris, and the ventral lower cervical cord is spared. Mild cord expansion suspected below the level of the tumor.  There is also a much smaller (2 mm) enhancing cord metastasis at the T2-T3 level just to the right of midline (series 10, image 8).  No definite cauda equina tumor. No abnormal dural thickening or enhancement.  Otherwise, bilateral lung nodules are suggested, and central right liver mass may have increased (series 7, image 39). See Chest abdomen pelvis CT 01/14/2014.  IMPRESSION: 1. 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 Associated spinal cord edema is widespread. 2. Diffuse osseous metastatic disease. Study discussed by telephone with Dr. [name] on 01/21/2014 at 15:29 .   Electronically Signed   By: Lars Pinks M.D.   On:  01/21/2014 15:31   Mr Lumbar Spine W Wo Contrast  01/20/2014   CLINICAL DATA:  New onset of lower extremity weakness. Breast cancer.  EXAM: MRI LUMBAR SPINE WITHOUT AND WITH CONTRAST  TECHNIQUE: Multiplanar and multiecho pulse sequences of the lumbar spine were obtained without and with intravenous contrast.  CONTRAST:  4mL MULTIHANCE GADOBENATE DIMEGLUMINE 529 MG/ML IV SOLN  COMPARISON:  CT ABD/PELVIS W CM dated 01/14/2014; MR HEAD WO/W CM dated 01/20/2014; NM PET IMAGE RESTAG (PS) SKULL BASE TO THIGH dated 10/08/2013  FINDINGS: The scan extends from midportion T11 through the sacrum.  Widespread marrow heterogeneity reflecting osseous metastatic disease and post treatment effect. No areas of pathologic compression deformity are evident. There is no visible epidural tumor. The alignment is anatomic.  No pelvic masses.  Bladder decompressed with Foley catheter.  No disc protrusion or spinal stenosis. Post infusion imaging demonstrates no abnormal intraspinal enhancement of the nerve roots or distal conus.  On sagittal T2 and STIR imaging, the conus appears abnormal, displaying intramedullary T2 bright signal and slight cord enlargement. An intramedullary metastasis above the field-of-view, at T10 or above, is suspected, with associated distal cord edema. There is no definite abnormal enhancement of the visualized thoracic cord or conus on post infusion imaging.  IMPRESSION: Suspected intramedullary metastasis above the field-of-view of lumbar spine MRI. The distal thoracic cord and conus appear to display T2 bright signal concerning for edema. Thoracic spine MRI without and with contrast recommended for further evaluation.  No evidence for spinal stenosis, dropped metastases to the cauda equina, or intraspinal mass lesion in the lumbar or sacral segments.  Widespread osseous disease without pathologic compression fracture or malalignment.   Electronically Signed   By: Rolla Flatten M.D.   On: 01/20/2014 19:19   Dg  Chest Port 1 View  01/29/2014   CLINICAL DATA:  FEVER FEVER  EXAM: PORTABLE CHEST - 1 VIEW  COMPARISON:  MR T SPINE W/CM dated 01/21/2014; CT CHEST W/CM dated 01/14/2014;  DG CHEST 1V PORT dated 09/19/2012  FINDINGS: The cardiac silhouette is enlarged. There are no focal infiltrates, effusions, nor edema. Stable prominence of the interstitial markings is appreciated. A left sided porta catheter is demonstrated via a subclavian approach with tip projecting in the region of the superior vena cava . No acute osseous abnormalities.  IMPRESSION: No evidence of acute cardiopulmonary disease. Chronic interstitial changes.   Electronically Signed   By: Margaree Mackintosh M.D.   On: 01/29/2014 19:50    Assessment/Plan  63 yo female with diffuse metastatic disease with profound hyponatremia  Principal Problem:   severe hyponatremia-  She is tolerating this very well at this time.  Ns ivf.  Ck bmp q 4 hours.  Suspect component of siadh.  Urine studies sent and pending.  May need 3%.  freq neuro cks.    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

## 2014-02-14 NOTE — Progress Notes (Signed)
Patient ID: Debbie Larsen, female   DOB: 1951/10/31, 63 y.o.   MRN: [id number]   Of note he did reassess patient later in the day-apparently she had developed some difficulty breathing.  She appeared to be uncomfortable.  I did reassess her she was using her  Accessory      muscles -oxygen had been titrated up O2 stats continued to be 90 or above she did receive a Xopenex breathing treatment which appeared to make her more comfortable.--With O2 saturation reaching 95%.  She was on approximately 3 L of oxygen at the time.  Blood pressure was 172/97  Pulse rate remained in the 90s-she did appear to be alert still speaking to me -- first somewhat labored  but appear to be a bit more comfortable after the breathing treatments.  However certainly this is a change in status from when I saw her earlier and feel  ER evaluation emergently would be warranted    .

## 2014-02-14 NOTE — ED Notes (Signed)
Pt reports feeling anxious and increasingly short of breath

## 2014-02-14 NOTE — ED Notes (Signed)
CRITICAL VALUE ALERT  Critical value received:  Na 103  Date of notification:  02/14/2014  Time of notification:  2045  Critical value read back:yes  Nurse who received alert:  Joellyn Rued, RN  MD notified (1st page):  Dr. Betsey Holiday  Time of first page:  2045  MD notified (2nd page):  Time of second page:  Responding MD:  Dr Betsey Holiday  Time MD responded:  2045

## 2014-02-14 NOTE — Progress Notes (Signed)
Patient ID: Debbie Larsen, female   DOB: Sep 30, 1951, 63 y.o.   MRN: [id number]   This is an acute visit.  Level of care skilled.  Facility [facility].  Chief complaint asked acute visit secondary to mental status changes yesterday.  History of present illness.  Patient is a 63 year old female with a history of right breast cancer with extensive metastasis.  This includes brain pulmonary and liver metastasis as well as into the pelvis.  She required readmission to the hospital recently for a UTI this appears to be relatively asymptomatic.  Her stay here recently has been relatively uneventful however apparently she had complained of pain over night a couple nights ago and received her when necessary Vicodin-apparently the next morning she was somewhat groggy confused with altered mental status apparently later in the day however she was back at her baseline and continues to be at her baseline today-her vital signs continued to be stable she does not have any complaints of headache dizziness nausea vomiting   Apparently she received the Vicodin only once usually has been taking Tylenol decent relief.  She does not complaining of pain today.  Family medical social history as been reviewed per admission note on 02/05/2014.  Medications have been reviewed per MAR.  Review of systems.  In general no complaints of fever or chills.  Respiratory does not complaining of any shortness of breath or cough.  Cardiac no chest pain.  Muscle skeletal does have a history of back pain but does not complaining of that today.  Neurologic does not complaining of any headache or dizziness.  Physical exam.  Temperature is 97.2 pulse 89 respirations 20 blood pressure 153/77 blood pressures appear somewhat variable 117/61-137/83I see one of 180systolic but this appears to be quite rare  In general this is a very frail appearing elderly female in no distress lying comfortably in bed.  Her skin is warm  and dry.  Eyes pupils appear reactive to light extraocular movements intact visual acuity appears intact.  Oral pharynx clear mucous membranes moist tongue is midline with full range of motion.  Heart is regular rate and rhythm without murmur gallop or rub she has no lower extremity edema.  Chest is clear to auscultation no labored breathing.  Abdomen continues to be somewhat distended but no tenderness with active bowel sounds.  Neurologic has no movement in either leg.  This is baseline.  Cranial nerves are grossly intact speech is clear.  Psych she is alert and oriented x3 and is asking quite a few questions which is her baseline certainly appropriate questions I do not see confusion this afternoon.  Labs.  February 07 2014.  WBC 9.6 hemoglobin 9.9 platelets 105.  Fasting glucose 94.  Urine culture 02/06/2014 showed no growth.  02/04/2014.  Sodium 136 potassium 3.6 BUN 16 creatinine 0.45.  Assessment and plan.  Altered mental status-this appears resolved one would suspect the narcotic medication at this point-will discontinue the Vicodin and monitor for any recurrence certainly appears to be stable today--.  [id number]

## 2014-02-14 NOTE — ED Provider Notes (Signed)
CSN: [id number]     Arrival date & time 02/14/14  1845 History   First MD Initiated Contact with Patient 02/14/14 1852     Chief Complaint  Patient presents with  . Shortness of Breath     (Consider location/radiation/quality/duration/timing/severity/associated sxs/prior Treatment) HPI Comments: Patient brought to the emergency department for increasing shortness of breath. Patient comes by ambulance from a nursing home. 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  Patient reports concomitant anxiousness with the onset of the shortness of breath. This began one hour ago. Oxygen saturation was reportedly 90% at the skilled nursing facility at onset of symptoms. She is on supplemental oxygen on arrival, somewhat improved.  Patient is a 63 y.o. female presenting with shortness of breath.  Shortness of Breath   Past Medical History  Diagnosis Date  . 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 Hypothyroid   . Enlarged lymph nodes   . 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  . S/P radiation therapy 12/26/01- 02/12/02     Right breast/regional lymph nodes/ 5040 cGy / 28 fractions/ bost of 1200 cGy/ 6 Fractions  . Status post chemotherapy     6 cycles of CMF completed on 12/12/2001  . Right shoulder pain     started late 2012  . Dry cough     intermittent  . SOB (shortness of breath)   . Use of letrozole (Femara) 03/14/2013  . Cholelithiasis   . Anemia   . BREAST CANCER 04/2001    s/p radiation therapy, chemotherapy and lumpectomy  . Metastasis to bone 03/13/13    PET scan results  . Metastasis to bone     breast primary  . Cancer     brain mets  . Lung cancer 10/08/2013  . 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 Stroke   . Hx of radiation therapy 12/25/13- 01/09/14    whole brain 3000  cGy in 10 fractions   Past Surgical History  Procedure Laterality Date  . Tonsillectomy    . Breast lumpectomy  04/2001    excision of L breast fibroadenoma by Dr.   . Breast lumpectomy  05/16/2001    Right axillary sentinel lymph node biopsy.  . Portacath placement  09/19/2012    Procedure: INSERTION PORT-A-CATH;  Surgeon: Haywood Lasso, MD;  Location: [facility];  Service: General;  Laterality: N/A;  . Lipoma excision Right 05/15/2013    Procedure: RIGHT AXILLARY LYMPH NODE DISSECTION;  Surgeon: Haywood Lasso, MD;  Location: [facility];  Service: General;  Laterality: Right;   Family History  Problem Relation Age of Onset  . Diabetes Mother   . Hypertension Father   . Hypertension Sister   . Hypothyroidism Brother   . Cancer Neg Hx    History  Substance Use Topics  . Smoking status: Never Smoker   . Smokeless tobacco: Never Used  . Alcohol Use: No   OB History   Grav Para Term Preterm Abortions TAB SAB Ect Mult Living   1 1 1       1      Obstetric Comments   G1, P1 Parity 28, Menopause with chemotherapy. No HRT use     Review of Systems  Respiratory: Positive for shortness of breath.   Psychiatric/Behavioral: The patient is nervous/anxious.   All other  systems reviewed and are negative.     Allergies  Review of patient's allergies indicates no known allergies.  Home Medications   Prior to Admission medications   Medication Sig Start Date End Date Taking? Authorizing Provider  acetaminophen (TYLENOL) 325 MG tablet Take 2 tablets (650 mg total) by mouth every 6 (six) hours as needed for moderate pain, fever or headache. 01/28/14   Venetia Maxon Rama, MD  alum & mag hydroxide-simeth (MAALOX/MYLANTA) 200-200-20 MG/5ML suspension Take 15 mLs by mouth every 4 (four) hours as needed for indigestion or heartburn. 01/28/14   Venetia Maxon Rama, MD  B Complex-C (B-COMPLEX WITH VITAMIN C) tablet Take 1 tablet by mouth daily.    Historical Provider, MD  calcium-vitamin D (OSCAL  WITH D) 500-200 MG-UNIT per tablet Take 2 tablets by mouth daily with breakfast. 02/14/14   Baird Cancer, PA-C  ciprofloxacin (CIPRO) 250 MG tablet Take 1 tablet (250 mg total) by mouth 2 (two) times daily. 02/03/14   Theodis Blaze, MD  diazepam (DIAZEPAM INTENSOL) 5 MG/ML solution Take 1 mL (5 mg total) by mouth every 6 (six) hours as needed for anxiety (or slepp). 02/03/14   Theodis Blaze, MD  feeding supplement, RESOURCE BREEZE, (RESOURCE BREEZE) LIQD Take 1 Container by mouth 3 (three) times daily between meals. 01/28/14   Venetia Maxon Rama, MD  gabapentin (NEURONTIN) 100 MG capsule Take 1 capsule (100 mg total) by mouth 2 (two) times daily. 01/28/14   Venetia Maxon Rama, MD  hydrocortisone (CORTEF) 10 MG tablet Take 2 tablets (20 mg total) by mouth 2 (two) times daily. 02/03/14   Theodis Blaze, MD  levothyroxine (SYNTHROID, LEVOTHROID) 75 MCG tablet Take 1 tablet (75 mcg total) by mouth every evening. 11/22/13   Andrena Mews, MD  lidocaine-prilocaine (EMLA) cream Apply topically as needed. Apply to port 1-2 hours before procedure 09/18/12   Eston Esters, MD  magnesium citrate SOLN Take 296 mLs (1 Bottle total) by mouth daily as needed for severe constipation. 01/28/14   Venetia Maxon Rama, MD  methylPREDNISolone acetate (DEPO-MEDROL) 40 MG/ML injection Give 40 mg IM every 2 weeks, first dose 01/29/14. 01/28/14   Venetia Maxon Rama, MD  Multiple Vitamin (MULTIVITAMIN WITH MINERALS) TABS Take 1 tablet by mouth daily.    Historical Provider, MD  prochlorperazine (COMPAZINE) 10 MG tablet Take 1 tablet (10 mg total) by mouth every 6 (six) hours as needed for nausea or vomiting. 02/03/14   Theodis Blaze, MD  senna-docusate (SENOKOT-S) 8.6-50 MG per tablet Take 2 tablets by mouth at bedtime as needed for mild constipation. 01/28/14   Venetia Maxon Rama, MD  sodium phosphate (FLEET) 7-19 GM/118ML ENEM Place 1 enema rectally daily as needed for severe constipation. 01/28/14   Christina P Rama, MD   BP 119/104  Pulse 96   Temp(Src) 98 F (36.7 C) (Oral)  Resp 21  Wt 104 lb (47.174 kg)  SpO2 95% Physical Exam  Constitutional: She appears well-developed and well-nourished.  HENT:  Head: Normocephalic and atraumatic.  Right Ear: Hearing normal.  Left Ear: Hearing normal.  Nose: Nose normal.  Mouth/Throat: Oropharynx is clear and moist and mucous membranes are normal.  Eyes: Conjunctivae and EOM are normal. Pupils are equal, round, and reactive to light.  Neck: Normal range of motion. Neck supple.  Cardiovascular: Regular rhythm, S1 normal and S2 normal.  Exam reveals no gallop and no friction rub.   No murmur heard. Pulmonary/Chest: Accessory muscle usage present. Tachypnea noted. No respiratory distress.  She has rhonchi. She exhibits no tenderness.  Abdominal: Soft. Normal appearance and bowel sounds are normal. There is no hepatosplenomegaly. There is no tenderness. There is no rebound, no guarding, no tenderness at McBurney's point and negative Murphy's sign. No hernia.  Musculoskeletal: Normal range of motion.  Neurological: She is alert. She has normal strength. She is disoriented. No cranial nerve deficit or sensory deficit. Coordination normal. GCS eye subscore is 4. GCS verbal subscore is 4. GCS motor subscore is 6.  Skin: Skin is warm, dry and intact. No rash noted. No cyanosis.  Psychiatric: Her speech is normal. Her mood appears anxious.    ED Course  Procedures (including critical care time) Labs Review Labs Reviewed  CBC WITH DIFFERENTIAL - Abnormal; Notable for the following:    RBC 3.46 (*)    Hemoglobin 9.6 (*)    HCT 25.7 (*)    MCV 74.3 (*)    MCHC 37.4 (*)    Platelets 109 (*)    Neutrophils Relative % 84 (*)    All other components within normal limits  COMPREHENSIVE METABOLIC PANEL - Abnormal; Notable for the following:    Sodium 103 (*)    Potassium 3.6 (*)    Chloride 69 (*)    Glucose, Bld 182 (*)    Creatinine, Ser <0.20 (*)    Calcium 7.5 (*)    Albumin 2.9 (*)     AST 40 (*)    ALT 49 (*)    Alkaline Phosphatase 376 (*)    All other components within normal limits  PRO B NATRIURETIC PEPTIDE - Abnormal; Notable for the following:    Pro B Natriuretic peptide (BNP) 263.1 (*)    All other components within normal limits  URINALYSIS, ROUTINE W REFLEX MICROSCOPIC - Abnormal; Notable for the following:    APPearance HAZY (*)    Specific Gravity, Urine <1.005 (*)    Glucose, UA 100 (*)    Hgb urine dipstick TRACE (*)    Leukocytes, UA SMALL (*)    All other components within normal limits  URINE CULTURE  CULTURE, BLOOD (ROUTINE X 2)  CULTURE, BLOOD (ROUTINE X 2)  TROPONIN I  URINE MICROSCOPIC-ADD ON  I-STAT CHEM 8, ED    Imaging Review Dg Chest 2 View  02/14/2014   CLINICAL DATA:  Shortness of breath, metastatic breast cancer.  EXAM: CHEST  2 VIEW  COMPARISON:  DG CHEST 1V PORT dated 01/29/2014; CT CHEST W/CM dated 01/14/2014; CT CHEST W/CM dated 07/03/2013  FINDINGS: Trachea is midline. Left subclavian power port tip is in the SVC. Heart size stable. Pulmonary parenchymal lesions seen on 01/14/2014 are poorly appreciated. No pleural fluid. Surgical clips in the right axilla.  IMPRESSION: Pulmonary parenchymal lesions seen on 01/14/2014 are poorly appreciated on the current exam. No large areas of airspace consolidation. No pleural fluid.   Electronically Signed   By: Lorin Picket M.D.   On: 02/14/2014 20:29     EKG Interpretation None      Date: 02/14/2014  Rate: 95  Rhythm: normal sinus rhythm  QRS Axis: normal  Intervals: normal  ST/T Wave abnormalities: normal  Conduction Disutrbances:none  Narrative Interpretation:   Old EKG Reviewed: none available    MDM   Final diagnoses:  Hyponatremia  Mental status change    Metastatic cancer presents initially complaining of shortness of breath. She had some evidence of increased respiratory rate at the nursing home and was sent to the ER. Time of arrival to ER,  however, she appears to be  breathing comfortably. Signs are stable otherwise. X-ray does not show any evidence of pneumonia, edema, et Ronney Asters. Cardiac workup was also unremarkable. She does not appear to be in congestive heart failure. Chemistries reveal profound hyponatremia. This likely explains the patient's acute mental status changes are seen here in the ER.  The patient's records reveals that she is a DO NOT RESUSCITATE. Nursing staff to talk to the patient's son in [place]. He understands that she is critically ill and he will be coming in from [place]. He did understand that she could die.  Based on the DO NOT RESUSCITATE status and the severity of her disease, I did not feel that performing a CT scan of her head was necessary. The level of hyponatremia explains mental status changes comment if there is intracranial bleeding or worsening of her metastatic disease, no interventions would be performed.  Patient will be admitted to the step down unit for further care.    Orpah Greek, MD 02/14/14 [phone]

## 2014-02-14 NOTE — ED Notes (Signed)
Spoke with Debbie Larsen the patient's Emergency contact which is her brother. He states he wants her kept alive until he gets here. He is on the way from [place] now and his contact number is [phone]

## 2014-02-14 NOTE — Progress Notes (Signed)
Tolerated Zometa without any problems. RX given to [facility] rep for patient. Calcium/Vit D 2 tablets daily.

## 2014-02-15 ENCOUNTER — Inpatient Hospital Stay (HOSPITAL_COMMUNITY): Payer: Medicaid Other

## 2014-02-15 DIAGNOSIS — E43 Unspecified severe protein-calorie malnutrition: Secondary | ICD-10-CM

## 2014-02-15 LAB — OSMOLALITY, URINE: Osmolality, Ur: 145 mOsm/kg — ABNORMAL LOW (ref 390–1090)

## 2014-02-15 LAB — BASIC METABOLIC PANEL
BUN: 5 mg/dL — AB (ref 6–23)
BUN: 5 mg/dL — ABNORMAL LOW (ref 6–23)
BUN: 5 mg/dL — ABNORMAL LOW (ref 6–23)
BUN: 5 mg/dL — AB (ref 6–23)
BUN: 5 mg/dL — AB (ref 6–23)
BUN: 5 mg/dL — AB (ref 6–23)
BUN: 6 mg/dL (ref 6–23)
BUN: 5 mg/dL — AB (ref 6–23)
BUN: 4 mg/dL — AB (ref 6–23)
CALCIUM: 7.4 mg/dL — AB (ref 8.4–10.5)
CALCIUM: 7.5 mg/dL — AB (ref 8.4–10.5)
CALCIUM: 8.3 mg/dL — AB (ref 8.4–10.5)
CALCIUM: 8.3 mg/dL — AB (ref 8.4–10.5)
CHLORIDE: 78 meq/L — AB (ref 96–112)
CHLORIDE: 80 meq/L — AB (ref 96–112)
CHLORIDE: 82 meq/L — AB (ref 96–112)
CHLORIDE: 83 meq/L — AB (ref 96–112)
CO2: 20 meq/L (ref 19–32)
CO2: 19 mEq/L (ref 19–32)
CO2: 19 mEq/L (ref 19–32)
CO2: 19 mEq/L (ref 19–32)
CO2: 20 meq/L (ref 19–32)
CO2: 21 meq/L (ref 19–32)
CO2: 21 meq/L (ref 19–32)
CO2: 22 mEq/L (ref 19–32)
CO2: 22 mEq/L (ref 19–32)
CREATININE: 0.22 mg/dL — AB (ref 0.50–1.10)
CREATININE: 0.21 mg/dL — AB (ref 0.50–1.10)
CREATININE: 0.24 mg/dL — AB (ref 0.50–1.10)
CREATININE: 0.22 mg/dL — AB (ref 0.50–1.10)
Calcium: 7.2 mg/dL — ABNORMAL LOW (ref 8.4–10.5)
Calcium: 7.5 mg/dL — ABNORMAL LOW (ref 8.4–10.5)
Calcium: 7.9 mg/dL — ABNORMAL LOW (ref 8.4–10.5)
Calcium: 8.3 mg/dL — ABNORMAL LOW (ref 8.4–10.5)
Calcium: 8.5 mg/dL (ref 8.4–10.5)
Chloride: 73 mEq/L — ABNORMAL LOW (ref 96–112)
Chloride: 75 mEq/L — ABNORMAL LOW (ref 96–112)
Chloride: 79 mEq/L — ABNORMAL LOW (ref 96–112)
Chloride: 82 mEq/L — ABNORMAL LOW (ref 96–112)
Chloride: 82 mEq/L — ABNORMAL LOW (ref 96–112)
Creatinine, Ser: <0.2 mg/dL — ABNORMAL LOW (ref 0.50–1.10)
Creatinine, Ser: <0.2 mg/dL — ABNORMAL LOW (ref 0.50–1.10)
Creatinine, Ser: 0.2 mg/dL — ABNORMAL LOW (ref 0.50–1.10)
Creatinine, Ser: 0.22 mg/dL — ABNORMAL LOW (ref 0.50–1.10)
Creatinine, Ser: 0.22 mg/dL — ABNORMAL LOW (ref 0.50–1.10)
GFR calc Af Amer: >90 mL/min (ref >90)
GFR calc Af Amer: >90 mL/min (ref >90)
GFR calc Af Amer: >90 mL/min (ref >90)
GFR calc Af Amer: >90 mL/min (ref >90)
GFR calc Af Amer: >90 mL/min (ref >90)
GFR calc non Af Amer: >90 mL/min (ref >90)
GFR calc non Af Amer: >90 mL/min (ref >90)
GFR calc non Af Amer: >90 mL/min (ref >90)
GFR calc non Af Amer: >90 mL/min (ref >90)
GFR calc non Af Amer: >90 mL/min (ref >90)
GLUCOSE: 144 mg/dL — AB (ref 70–99)
GLUCOSE: 184 mg/dL — AB (ref 70–99)
GLUCOSE: 143 mg/dL — AB (ref 70–99)
GLUCOSE: 151 mg/dL — AB (ref 70–99)
GLUCOSE: 218 mg/dL — AB (ref 70–99)
GLUCOSE: 227 mg/dL — AB (ref 70–99)
Glucose, Bld: 149 mg/dL — ABNORMAL HIGH (ref 70–99)
Glucose, Bld: 165 mg/dL — ABNORMAL HIGH (ref 70–99)
Glucose, Bld: 219 mg/dL — ABNORMAL HIGH (ref 70–99)
POTASSIUM: 3.5 meq/L — AB (ref 3.7–5.3)
POTASSIUM: 4.8 meq/L (ref 3.7–5.3)
Potassium: 3.4 mEq/L — ABNORMAL LOW (ref 3.7–5.3)
Potassium: 3.1 mEq/L — ABNORMAL LOW (ref 3.7–5.3)
Potassium: 3 mEq/L — ABNORMAL LOW (ref 3.7–5.3)
Potassium: 2.9 mEq/L — CL (ref 3.7–5.3)
Potassium: 4.9 mEq/L (ref 3.7–5.3)
Potassium: 4.9 mEq/L (ref 3.7–5.3)
Potassium: 4.1 mEq/L (ref 3.7–5.3)
SODIUM: 112 meq/L — AB (ref 137–147)
SODIUM: 116 meq/L — AB (ref 137–147)
SODIUM: 116 meq/L — AB (ref 137–147)
Sodium: 107 mEq/L — CL (ref 137–147)
Sodium: 109 mEq/L — CL (ref 137–147)
Sodium: 113 mEq/L — CL (ref 137–147)
Sodium: 113 mEq/L — CL (ref 137–147)
Sodium: 115 mEq/L — CL (ref 137–147)
Sodium: 118 mEq/L — CL (ref 137–147)

## 2014-02-15 LAB — CREATININE, URINE, RANDOM: Creatinine, Urine: <10 mg/dL

## 2014-02-15 LAB — TSH: TSH: 15.36 u[IU]/mL — AB (ref 0.350–4.500)

## 2014-02-15 LAB — URIC ACID: URIC ACID, SERUM: 2.1 mg/dL — AB (ref 2.4–7.0)

## 2014-02-15 LAB — SODIUM, URINE, RANDOM
SODIUM UR: 20 meq/L
Sodium, Ur: <20 mEq/L

## 2014-02-15 LAB — OSMOLALITY: Osmolality: 223 mOsm/kg — ABNORMAL LOW (ref 275–300)

## 2014-02-15 MED ORDER — LEVOTHYROXINE SODIUM 75 MCG PO TABS
75.0000 ug | ORAL_TABLET | Freq: Every day | ORAL | Status: DC
  Administered 2014-02-15 – 2014-02-16 (×2): 75 ug via ORAL
  Filled 2014-02-15 (×2): qty 1

## 2014-02-15 MED ORDER — POTASSIUM CHLORIDE 20 MEQ/15ML (10%) PO LIQD
40.0000 meq | ORAL | Status: AC
  Administered 2014-02-15 (×2): 40 meq via ORAL
  Filled 2014-02-15 (×2): qty 30

## 2014-02-15 MED ORDER — DEXTROSE 5 % IV SOLN
INTRAVENOUS | Status: DC
  Administered 2014-02-15: 15:00:00 via INTRAVENOUS

## 2014-02-15 MED ORDER — SODIUM CHLORIDE 0.9 % IJ SOLN
10.0000 mL | INTRAMUSCULAR | Status: DC | PRN
  Administered 2014-02-15: 10 mL via INTRAVENOUS

## 2014-02-15 MED ORDER — POTASSIUM CHLORIDE CRYS ER 20 MEQ PO TBCR: 30.0000 meq | EXTENDED_RELEASE_TABLET | ORAL | Status: DC

## 2014-02-15 MED ORDER — IOHEXOL 350 MG/ML SOLN
100.0000 mL | Freq: Once | INTRAVENOUS | Status: AC | PRN
  Administered 2014-02-15: 100 mL via INTRAVENOUS

## 2014-02-15 NOTE — Progress Notes (Signed)
CRITICAL VALUE ALERT  Critical value received:  Na+116  Date of notification:  Eulis Canner Schonewitz  Time of notification:  5:43 PM  Critical value read back:yes  Nurse who received alert:  Burna Sis, rn  MD notified (1st page):  Tat  Time of first page:  5:43 PM  MD notified (2nd page):  Time of second page:  Responding MD:  Tat  Time MD responded:  Value consistent with previously reported lab

## 2014-02-15 NOTE — Progress Notes (Signed)
Critical sodium of 107 called by lab. Value reported to MD via text page

## 2014-02-15 NOTE — Progress Notes (Signed)
  CRITICAL VALUE ALERT  Critical value received: Na+113  Date of notification:  Eulis Canner Schonewitz  Time of notification:  1145  Critical value read back:yes  Nurse who received alert:  nedine rowe, rn  MD notified (1st page): tat  Time of first page:  61  MD notified (2nd page):  Time of second page:  Responding MD:  Value consistent with previously reported lab  Time MD responded:

## 2014-02-15 NOTE — Progress Notes (Signed)
CRITICAL VALUE ALERT  Critical value received:  Na+112  Date of notification:  02/15/2014  Time of notification:  0950  Critical value read back:yes  Nurse who received alert:  nedine rowe, rn  MD notified (1st page):  tat  Time of first page:  0955  MD notified (2nd page):  Time of second page:  Responding MD:  Value consistent with previously reported lab  Time MD responded:

## 2014-02-15 NOTE — Progress Notes (Signed)
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, brain, spinal cord, liver -Suspect lung versus breast primary -Patient is in denial regarding prognosis -Most recently received Nivolumab on 02/05/14 Tachycardia/dyspnea -CT angiogram chest 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  EXAM: CHEST  2 VIEW  COMPARISON:  DG CHEST 1V PORT dated 01/29/2014; CT CHEST W/CM dated 01/14/2014; CT CHEST W/CM dated 07/03/2013  FINDINGS: Trachea is midline. Left subclavian power port tip is in the SVC. Heart size stable. Pulmonary parenchymal lesions seen on 01/14/2014 are poorly appreciated. No pleural fluid. Surgical clips in the right axilla.  IMPRESSION: Pulmonary parenchymal lesions seen on 01/14/2014 are poorly appreciated on the current exam. No large areas of airspace consolidation. No pleural fluid.   Electronically Signed   By: Lorin Picket M.D.   On: 02/14/2014 20:29   Mr Jeri Cos SE Contrast  01/20/2014   CLINICAL DATA:  Breast cancer.  Evaluate for metastatic disease.   EXAM: MRI HEAD WITHOUT AND WITH CONTRAST  TECHNIQUE: Multiplanar, multiecho pulse sequences of the brain and surrounding structures were obtained without and with intravenous contrast.  CONTRAST:  MultiHance 9 mL.  COMPARISON:  MR HEAD WO/W CM dated 12/18/2013; MR C SPINE WO/W CM dated 02/21/2013  FINDINGS: No acute stroke or hemorrhage. No hydrocephalus or extra-axial fluid.  Intracranial metastatic disease is re-demonstrated, as was observed on the previous MR. Subcentimeter lesions are seen in the right cerebellar hemisphere, inferior vermis, right temporal lobe, left temporal lobe, and multiple lesions throughout the subcortical white matter of left greater than right cerebral hemispheres. The dominant lesion is an osseous metastasis to the greater wing of the sphenoid on the right, measures 17 x 19 mm, unchanged in size from priors. A dural based lesion is also seen over the right frontal convexity, similar to priors. No midline shift. No impending herniation. Moderate vasogenic edema is associated with the right temporal lesion, increased from priors, and could predispose the patient to seizures. Other lesions also are roughly similar, including a spherical 7 mm lesion deep to the insula. There is slight increased edema surrounding the right cerebellar lesion compared with priors, but there is slightly less edema surrounding the left parietal subcortical lesion. A few more lesions are visible than were previously noted on the 12/18/2013 study, but there was considerable motion on that exam.  Marrow heterogeneity in the clivus could suggest additional tumor involvement. Low T1 signal intensity bone marrow in the upper cervical region unchanged from priors.  IMPRESSION: Suspected slight progression of metastatic disease, with a few more lesions visible than were observed on 12/18/2013, as well as increased vasogenic edema surrounding the right middle cranial fossa osseous lesion,  and right cerebellar lesion. No  impending herniation or midline shift.  No lesions involving the parasagittal posterior frontal cortex which might cause bilateral leg weakness.   Electronically Signed   By: Rolla Flatten M.D.   On: 01/20/2014 20:09   Mr Thoracic Spine W Contrast  01/21/2014   CLINICAL DATA:  63 year old female with stage IV breast cancer. New onset lower extremity weakness and unable to move the lower extremities. Initial encounter.  EXAM: MRI THORACIC SPINE WITH CONTRAST  TECHNIQUE: Multiplanar and multiecho pulse sequences of the thoracic spine were obtained with intravenous contrast.  CONTRAST:  52mL MULTIHANCE GADOBENATE DIMEGLUMINE 529 MG/ML IV SOLN  COMPARISON:  Lumbar MRI 01/20/2014.  FINDINGS: Diffusely abnormal bone marrow signal. Subsequently, cervical vertebral delineation is difficult on the sagittal scout view. The numbering system here agrees with that on the recent lumbar exam.  Diffuse thoracic spine and widespread posterior rib metastases. Still, no thoracic epidural or extraosseous tumor identified.  Intra-axial partially exophytic solidly enhancing mass of the thoracic spinal cord centered at the T8 level measures 7 x 9 x 17 mm (AP by transverse by CC) and is associated with widespread thoracic spinal cord edema. Only a portion of the conus medullaris, and the ventral lower cervical cord is spared. Mild cord expansion suspected below the level of the tumor.  There is also a much smaller (2 mm) enhancing cord metastasis at the T2-T3 level just to the right of midline (series 10, image 8).  No definite cauda equina tumor. No abnormal dural thickening or enhancement.  Otherwise, bilateral lung nodules are suggested, and central right liver mass may have increased (series 7, image 39). See Chest abdomen pelvis CT 01/14/2014.  IMPRESSION: 1. 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 Associated spinal cord edema is widespread. 2. Diffuse osseous metastatic disease. Study  discussed by telephone with Dr. [name] on 01/21/2014 at 15:29 .   Electronically Signed   By: Lars Pinks M.D.   On: 01/21/2014 15:31   Mr Lumbar Spine W Wo Contrast  01/20/2014   CLINICAL DATA:  New onset of lower extremity weakness. Breast cancer.  EXAM: MRI LUMBAR SPINE WITHOUT AND WITH CONTRAST  TECHNIQUE: Multiplanar and multiecho pulse sequences of the lumbar spine were obtained without and with intravenous contrast.  CONTRAST:  78mL MULTIHANCE GADOBENATE DIMEGLUMINE 529 MG/ML IV SOLN  COMPARISON:  CT ABD/PELVIS W CM dated 01/14/2014; MR HEAD WO/W CM dated 01/20/2014; NM PET IMAGE RESTAG (PS) SKULL BASE TO THIGH dated 10/08/2013  FINDINGS: The scan extends from midportion T11 through the sacrum.  Widespread marrow heterogeneity reflecting osseous metastatic disease and post treatment effect. No areas of pathologic compression deformity are evident. There is no visible epidural tumor. The alignment is anatomic.  No pelvic masses.  Bladder decompressed with Foley catheter.  No disc protrusion or spinal stenosis. Post infusion imaging demonstrates no abnormal intraspinal enhancement of the nerve roots or distal conus.  On sagittal T2 and STIR imaging, the conus appears abnormal, displaying intramedullary T2 bright signal and slight cord enlargement. An intramedullary metastasis above the field-of-view, at T10 or above, is suspected, with associated distal cord edema. There is no definite abnormal enhancement of the visualized thoracic cord or conus on post infusion imaging.  IMPRESSION: Suspected intramedullary metastasis above the field-of-view of lumbar spine MRI. The distal thoracic cord and conus appear to display T2 bright signal concerning for edema. Thoracic spine MRI without and with contrast recommended for further evaluation.  No evidence for spinal stenosis, dropped metastases to the cauda equina, or intraspinal mass lesion in the lumbar or sacral segments.  Widespread osseous disease without  pathologic compression fracture or malalignment.   Electronically Signed   By: Rolla Flatten M.D.   On: 01/20/2014 19:19   Dg Chest Port 1 View  01/29/2014   CLINICAL DATA:  FEVER FEVER  EXAM: PORTABLE CHEST - 1 VIEW  COMPARISON:  MR T SPINE W/CM dated 01/21/2014; CT CHEST W/CM dated 01/14/2014; DG CHEST 1V PORT dated 09/19/2012  FINDINGS: The cardiac silhouette is enlarged. There are no focal infiltrates, effusions, nor edema. Stable prominence of the interstitial markings is appreciated. A left sided porta catheter is demonstrated via a subclavian approach with tip projecting in the region of the superior vena cava . No acute osseous abnormalities.  IMPRESSION: No evidence of acute cardiopulmonary disease. Chronic interstitial changes.   Electronically Signed   By: Margaree Mackintosh M.D.   On: 01/29/2014 19:50         Subjective: Patient is more lucid.  Patient denies fevers, chills, headache, chest pain, dyspnea, nausea, vomiting, diarrhea, abdominal pain, dysuria, hematuria   Objective: Filed Vitals:   02/15/14 1400 02/15/14 1442 02/15/14 1600 02/15/14 1700  BP: 129/107  131/73 141/78  Pulse:      Temp:      TempSrc:      Resp: 24  17 25   Height:      Weight:      SpO2:  98%      Intake/Output Summary (Last 24 hours) at 02/15/14 1749 Last data filed at 02/15/14 1700  Gross per 24 hour  Intake 1161.25 ml  Output   2100 ml  Net -938.75 ml   Weight change:  Exam:   General:  Pt is alert, follows commands appropriately, not in acute distress  HEENT: No icterus, No thrush,[place]/AT  Cardiovascular: RRR, S1/S2, no rubs, no gallops  Respiratory: Bilateral scattered rales. No wheezing. Good air movement.   Abdomen: Soft/+BS, non tender, non distended, no guarding  Extremities: 1+LE edema, No lymphangitis, No petechiae, No rashes, no synovitis  Data Reviewed: Basic Metabolic Panel:  Recent Labs Lab 02/15/14 0714 02/15/14 0856 02/15/14 1114 02/15/14 1328 02/15/14 1706  NA  113* 112* 113* 115* 116*  K 3.0* 2.9* 3.5* 4.9 4.9  CL 79* 78* 80* 82* 82*  CO2 19 19 20 21 21   GLUCOSE 149* 143* 151* 165* 218*  BUN 5* 5* 5* 5* 6  CREATININE 0.21* <0.20* 0.24* 0.20* 0.22*  CALCIUM 7.5* 7.5* 7.9* 8.3* 8.3*   Liver Function Tests:  Recent Labs Lab 02/14/14 1946  AST 40*  ALT 49*  ALKPHOS 376*  BILITOT 1.1  PROT 6.7  ALBUMIN 2.9*   No results found for this basename: LIPASE, AMYLASE,  in the last 168 hours No results found for this basename: AMMONIA,  in the last 168 hours CBC:  Recent Labs Lab 02/14/14 1946 02/14/14 2208  WBC 6.8  --   NEUTROABS 5.7  --   HGB 9.6* 10.2*  HCT 25.7* 30.0*  MCV 74.3*  --   PLT 109*  --    Cardiac Enzymes:  Recent Labs Lab 02/14/14 1946  TROPONINI <0.30   BNP: No components found with this basename: POCBNP,  CBG: No results found for this basename: GLUCAP,  in the last 168 hours  Recent Results (from the past 240 hour(s))  CULTURE, BLOOD (ROUTINE X 2)     Status: None   Collection Time  02/14/14  7:46 PM      Result Value Ref Range Status   Specimen Description BLOOD PORTA CATH   Final   Special Requests BOTTLES DRAWN AEROBIC AND ANAEROBIC 6CC   Final   Culture NO GROWTH 1 DAY   Final   Report Status PENDING   Incomplete  CULTURE, BLOOD (ROUTINE X 2)     Status: None   Collection Time    02/14/14  7:52 PM      Result Value Ref Range Status   Specimen Description BLOOD LEFT ANTECUBITAL   Final   Special Requests BOTTLES DRAWN AEROBIC AND ANAEROBIC 6CC   Final   Culture NO GROWTH 1 DAY   Final   Report Status PENDING   Incomplete     Scheduled Meds: . B-complex with vitamin C  1 tablet Oral Daily  . calcium-vitamin D  2 tablet Oral Q breakfast  . feeding supplement (RESOURCE BREEZE)  1 Container Oral TID BM  . gabapentin  100 mg Oral BID  . hydrocortisone  20 mg Oral BID  . ipratropium-albuterol  3 mL Nebulization Q6H  . levothyroxine  75 mcg Oral QAC breakfast  . multivitamin with minerals  1  tablet Oral Daily   Continuous Infusions: . dextrose 75 mL/hr at 02/15/14 1700     Orson Eva, DO  [facility] Pager [phone]  If 7PM-7AM, please contact night-coverage [email or web address] Password [email or web address] 02/15/2014, 5:49 PM   LOS: 1 day

## 2014-02-15 NOTE — Progress Notes (Signed)
Critical sodium of 109 called by lab Value reported to MD via text page

## 2014-02-15 NOTE — Progress Notes (Signed)
CRITICAL VALUE ALERT  Critical value received: Na+ 113  Date of notification:  02/15/2014  Time of notification:  0745  Critical value read back:yes  Nurse who received alert:  Eulis Canner schonewitz, rn   MD notified (1st page): tat  Time of first page:  0750  MD notified (2nd page):  Time of second page:  Responding MD:  Tat  Time MD responded:  [phone]

## 2014-02-16 DIAGNOSIS — E039 Hypothyroidism, unspecified: Secondary | ICD-10-CM

## 2014-02-16 LAB — BASIC METABOLIC PANEL
BUN: 3 mg/dL — ABNORMAL LOW (ref 6–23)
BUN: 3 mg/dL — ABNORMAL LOW (ref 6–23)
BUN: 4 mg/dL — AB (ref 6–23)
BUN: 4 mg/dL — ABNORMAL LOW (ref 6–23)
BUN: 4 mg/dL — ABNORMAL LOW (ref 6–23)
BUN: 4 mg/dL — ABNORMAL LOW (ref 6–23)
BUN: 5 mg/dL — ABNORMAL LOW (ref 6–23)
CALCIUM: 8.9 mg/dL (ref 8.4–10.5)
CALCIUM: 8.7 mg/dL (ref 8.4–10.5)
CALCIUM: 8.6 mg/dL (ref 8.4–10.5)
CHLORIDE: 82 meq/L — AB (ref 96–112)
CHLORIDE: 81 meq/L — AB (ref 96–112)
CHLORIDE: 80 meq/L — AB (ref 96–112)
CHLORIDE: 82 meq/L — AB (ref 96–112)
CO2: 21 mEq/L (ref 19–32)
CO2: 19 mEq/L (ref 19–32)
CO2: 22 mEq/L (ref 19–32)
CO2: 22 mEq/L (ref 19–32)
CO2: 20 meq/L (ref 19–32)
CO2: 21 mEq/L (ref 19–32)
CO2: 22 meq/L (ref 19–32)
CREATININE: 0.24 mg/dL — AB (ref 0.50–1.10)
CREATININE: 0.23 mg/dL — AB (ref 0.50–1.10)
Calcium: 8.6 mg/dL (ref 8.4–10.5)
Calcium: 9 mg/dL (ref 8.4–10.5)
Calcium: 8.4 mg/dL (ref 8.4–10.5)
Calcium: 8.6 mg/dL (ref 8.4–10.5)
Chloride: 85 mEq/L — ABNORMAL LOW (ref 96–112)
Chloride: 83 mEq/L — ABNORMAL LOW (ref 96–112)
Chloride: 78 mEq/L — ABNORMAL LOW (ref 96–112)
Creatinine, Ser: 0.25 mg/dL — ABNORMAL LOW (ref 0.50–1.10)
Creatinine, Ser: 0.24 mg/dL — ABNORMAL LOW (ref 0.50–1.10)
Creatinine, Ser: 0.21 mg/dL — ABNORMAL LOW (ref 0.50–1.10)
Creatinine, Ser: 0.21 mg/dL — ABNORMAL LOW (ref 0.50–1.10)
Creatinine, Ser: 0.22 mg/dL — ABNORMAL LOW (ref 0.50–1.10)
GFR calc Af Amer: >90 mL/min (ref >90)
GFR calc Af Amer: >90 mL/min (ref >90)
GFR calc Af Amer: >90 mL/min (ref >90)
GFR calc non Af Amer: >90 mL/min (ref >90)
GFR calc non Af Amer: >90 mL/min (ref >90)
GFR calc non Af Amer: >90 mL/min (ref >90)
GLUCOSE: 206 mg/dL — AB (ref 70–99)
Glucose, Bld: 194 mg/dL — ABNORMAL HIGH (ref 70–99)
Glucose, Bld: 165 mg/dL — ABNORMAL HIGH (ref 70–99)
Glucose, Bld: 171 mg/dL — ABNORMAL HIGH (ref 70–99)
Glucose, Bld: 167 mg/dL — ABNORMAL HIGH (ref 70–99)
Glucose, Bld: 212 mg/dL — ABNORMAL HIGH (ref 70–99)
Glucose, Bld: 161 mg/dL — ABNORMAL HIGH (ref 70–99)
POTASSIUM: 3.6 meq/L — AB (ref 3.7–5.3)
POTASSIUM: 3.5 meq/L — AB (ref 3.7–5.3)
POTASSIUM: 3.7 meq/L (ref 3.7–5.3)
Potassium: 3.8 mEq/L (ref 3.7–5.3)
Potassium: 3.7 mEq/L (ref 3.7–5.3)
Potassium: 3.5 mEq/L — ABNORMAL LOW (ref 3.7–5.3)
Potassium: 3.9 mEq/L (ref 3.7–5.3)
SODIUM: 119 meq/L — AB (ref 137–147)
SODIUM: 116 meq/L — AB (ref 137–147)
SODIUM: 116 meq/L — AB (ref 137–147)
SODIUM: 112 meq/L — AB (ref 137–147)
SODIUM: 118 meq/L — AB (ref 137–147)
Sodium: 118 mEq/L — CL (ref 137–147)
Sodium: 114 mEq/L — CL (ref 137–147)

## 2014-02-16 LAB — T4, FREE: FREE T4: 1.14 ng/dL (ref 0.80–1.80)

## 2014-02-16 LAB — CBC
HCT: 23.1 % — ABNORMAL LOW (ref 36.0–46.0)
Hemoglobin: 8.3 g/dL — ABNORMAL LOW (ref 12.0–15.0)
MCH: 27.6 pg (ref 26.0–34.0)
MCHC: 35.9 g/dL (ref 30.0–36.0)
MCV: 76.7 fL — ABNORMAL LOW (ref 78.0–100.0)
Platelets: 113 10*3/uL — ABNORMAL LOW (ref 150–400)
RBC: 3.01 MIL/uL — AB (ref 3.87–5.11)
RDW: 16.8 % — ABNORMAL HIGH (ref 11.5–15.5)
WBC: 7.1 10*3/uL (ref 4.0–10.5)

## 2014-02-16 LAB — GLUCOSE, CAPILLARY
GLUCOSE-CAPILLARY: 152 mg/dL — AB (ref 70–99)
Glucose-Capillary: 168 mg/dL — ABNORMAL HIGH (ref 70–99)
Glucose-Capillary: 162 mg/dL — ABNORMAL HIGH (ref 70–99)

## 2014-02-16 MED ORDER — LEVOTHYROXINE SODIUM 100 MCG PO TABS
100.0000 ug | ORAL_TABLET | Freq: Every day | ORAL | Status: DC
  Administered 2014-02-17 – 2014-02-20 (×4): 100 ug via ORAL
  Filled 2014-02-16 (×4): qty 1

## 2014-02-16 MED ORDER — VANCOMYCIN HCL IN DEXTROSE 1-5 GM/200ML-% IV SOLN
1000.0000 mg | INTRAVENOUS | Status: DC
  Administered 2014-02-16 – 2014-02-18 (×3): 1000 mg via INTRAVENOUS
  Filled 2014-02-16 (×4): qty 200

## 2014-02-16 MED ORDER — SODIUM CHLORIDE 0.9 % IV SOLN
INTRAVENOUS | Status: DC
  Administered 2014-02-16 – 2014-02-19 (×3): via INTRAVENOUS

## 2014-02-16 MED ORDER — INSULIN ASPART 100 UNIT/ML ~~LOC~~ SOLN
0.0000 [IU] | Freq: Three times a day (TID) | SUBCUTANEOUS | Status: DC
  Administered 2014-02-16 – 2014-02-17 (×5): 2 [IU] via SUBCUTANEOUS
  Administered 2014-02-18: 1 [IU] via SUBCUTANEOUS
  Administered 2014-02-18: 2 [IU] via SUBCUTANEOUS
  Administered 2014-02-18: 1 [IU] via SUBCUTANEOUS

## 2014-02-16 MED ORDER — DEXTROSE 5 % IV SOLN
1.0000 g | Freq: Three times a day (TID) | INTRAVENOUS | Status: DC
  Administered 2014-02-16 – 2014-02-19 (×9): 1 g via INTRAVENOUS
  Filled 2014-02-16 (×13): qty 1

## 2014-02-16 NOTE — Progress Notes (Signed)
Pt has slept well through the night did not wake for 2am neb. Nurse notified.

## 2014-02-16 NOTE — Progress Notes (Signed)
I updated the patient at bedside her regarding her clinical condition. I informed her that when the oncologist returns, I will consult him on 02/17/2014. After a long discussion, the patient has decided to make herself a full code. She has rescinded DO NOT RESUSCITATE status. The patient has full capacity to make her decision presently. DTat

## 2014-02-16 NOTE — Progress Notes (Signed)
PROGRESS NOTE  Debbie Larsen HYQ:[id number] DOB: 10/23/1951 DOA: 02/14/2014 PCP: Melrose Nakayama, MD  Assessment/Plan: Altered mental status  -Secondary to hyponatremia  -Improved with correction of hyponatremia  Hyponatremia  -Likely due to SIADH with a combination of poor solute intake and volume depletion  -FeNa 0.35%  -Na increasing even after stopping NS  -start D5W to avoid rapid sodium increase  -TSH--15.36 -Uric acid 2.1--further validates component of SIADH  -Urine Osm 145, sOsm 223  -Continue every 2 hour BMPs Metastatic poorly differentiated carcinoma  -Metastasis to lung, brain, spinal cord, liver  -Suspect lung versus breast primary  -Patient is in denial regarding prognosis  -Most recently received Nivolumab on 02/05/14  -Consult the patient's oncologist when he returns on 02/17/2014 -Patient continues to have unrealistic expectations Tachycardia/dyspnea  -CT angiogram chest--negative for PE but shows increased bilateral upper lobe groundglass opacities with new ill-defined lower lobe nodular areas with considerations for infectious, inflammatory, and neoplastic etiologies-- also revealed increased size of liver lesions  -Empirically start the patient on IV antibiotics for treatment of possible infectious process  -EKG   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  EXAM: CHEST  2 VIEW  COMPARISON:  DG CHEST 1V PORT dated 01/29/2014; CT CHEST W/CM dated  01/14/2014; CT CHEST W/CM dated 07/03/2013  FINDINGS: Trachea is midline. Left subclavian power port tip is in the SVC. Heart size stable. Pulmonary parenchymal lesions seen on 01/14/2014 are poorly appreciated. No pleural fluid. Surgical clips in the right axilla.  IMPRESSION: Pulmonary parenchymal lesions seen on 01/14/2014 are poorly appreciated on the current exam. No large areas of airspace consolidation. No pleural fluid.   Electronically Signed   By: Lorin Picket M.D.   On: 02/14/2014 20:29   Ct Angio Chest Pe W/cm &/or Wo Cm  02/15/2014   CLINICAL DATA:  Breast cancer. Status post lumpectomy, chemotherapy and radiation. Metastases to lung, bone and brain. Tachycardia, shortness of breath.  EXAM: CT ANGIOGRAPHY CHEST WITH CONTRAST  TECHNIQUE: Multidetector CT imaging of the chest was performed using the standard protocol during bolus administration of intravenous contrast. Multiplanar CT image reconstructions and MIPs were obtained to evaluate the vascular anatomy.  CONTRAST:  16mL OMNIPAQUE IOHEXOL 350 MG/ML SOLN  COMPARISON:  01/14/2014  FINDINGS: Left-sided Port-A-Cath is in place with the tip in the SVC. Heart is borderline in size. There is a small pericardial effusion. Aorta is normal caliber.  6 mm prevascular lymph node on image 25. Abnormal soft tissue in the right paratracheal region is difficult to measure as this appears to be rather diffuse and ill-defined. This has increased since prior study.  Right lower lobe pulmonary nodule on image 49 measures 7 mm, not significantly changed. Nodular area in the left upper lobe in the perihilar region on image 36 measures 10 mm compared with 9 mm previously, not significantly changed. Areas of peripheral ground-glass airspace opacity noted peripherally in the upper lobes bilaterally, increased since prior study. There are numerous small somewhat ill-defined lower lobe pulmonary  nodular areas, many of which are new since prior study. These are small, 3-4  mm or less in size. Marland Kitchen  Postsurgical changes in the right breast and axilla.  Diffuse sclerotic lesions throughout the thoracic spine compatible with metastases. Multiple bilateral rib lesions.  Multiple rim enhancing lesions within the liver. 2.4 cm lesion within the right hepatic lobe compared with 1.8 cm previously. 1.8 cm lesion in the left hepatic lobe on image 69 compared with 8 mm previously. The other lesions in the dome with the liver are not as well visualized on this Chest CT due to bolus timing.  Review of the MIP images confirms the above findings.  IMPRESSION: New patchy ground-glass airspace opacities within the upper lobes bilaterally peripherally. There are also numerous small ill-defined subcentimeric pulmonary nodules in the lower lobes. Previously seen nodular densities in the lungs are relatively stable. Overall process in the lungs could reflect lymphangitic spread of tumor, atypical infections or drug toxicity. Pulmonary nodularity scratch head the pulmonary nodules are concerning for metastases.  Small pericardial effusion.  Ill-defined abnormal soft tissue in the mediastinum, predominantly in the paratracheal region, difficult to measure due to its indistinct appearance.  Slight enlargement of at least 2 hepatic metastases.  Stable diffuse bony metastases.   Electronically Signed   By: Rolm Baptise M.D.   On: 02/15/2014 20:09   Mr Jeri Cos DQ Contrast  01/20/2014   CLINICAL DATA:  Breast cancer.  Evaluate for metastatic disease.  EXAM: MRI HEAD WITHOUT AND WITH CONTRAST  TECHNIQUE: Multiplanar, multiecho pulse sequences of the brain and surrounding structures were obtained without and with intravenous contrast.  CONTRAST:  MultiHance 9 mL.  COMPARISON:  MR HEAD WO/W CM dated 12/18/2013; MR C SPINE WO/W CM dated 02/21/2013  FINDINGS: No acute stroke or hemorrhage. No hydrocephalus or extra-axial fluid.  Intracranial metastatic disease is re-demonstrated, as was observed on the previous MR.  Subcentimeter lesions are seen in the right cerebellar hemisphere, inferior vermis, right temporal lobe, left temporal lobe, and multiple lesions throughout the subcortical white matter of left greater than right cerebral hemispheres. The dominant lesion is an osseous metastasis to the greater wing of the sphenoid on the right, measures 17 x 19 mm, unchanged in size from priors. A dural based lesion is also seen over the right frontal convexity, similar to priors. No midline shift. No impending herniation. Moderate vasogenic edema is associated with the right temporal lesion, increased from priors, and could predispose the patient to seizures. Other lesions also are roughly similar, including a spherical 7 mm lesion deep to the insula. There is slight increased edema surrounding the right cerebellar lesion compared with priors, but there is slightly less edema surrounding the left parietal subcortical lesion. A few more lesions are visible than were previously noted on the 12/18/2013 study, but there was considerable motion on that exam.  Marrow heterogeneity in the clivus could suggest additional tumor involvement. Low T1 signal intensity bone marrow in the upper cervical region unchanged from priors.  IMPRESSION: Suspected slight progression of metastatic disease, with a few more lesions visible than were observed on 12/18/2013, as well as increased vasogenic edema surrounding the right middle cranial fossa osseous lesion, and right cerebellar lesion. No impending herniation or midline shift.  No lesions involving the parasagittal posterior frontal cortex which might cause bilateral leg weakness.   Electronically Signed   By: Rolla Flatten M.D.   On: 01/20/2014 20:09   Mr Thoracic Spine W Contrast  01/21/2014  CLINICAL DATA:  63 year old female with stage IV breast cancer. New onset lower extremity weakness and unable to move the lower extremities. Initial encounter.  EXAM: MRI THORACIC SPINE WITH CONTRAST   TECHNIQUE: Multiplanar and multiecho pulse sequences of the thoracic spine were obtained with intravenous contrast.  CONTRAST:  5mL MULTIHANCE GADOBENATE DIMEGLUMINE 529 MG/ML IV SOLN  COMPARISON:  Lumbar MRI 01/20/2014.  FINDINGS: Diffusely abnormal bone marrow signal. Subsequently, cervical vertebral delineation is difficult on the sagittal scout view. The numbering system here agrees with that on the recent lumbar exam.  Diffuse thoracic spine and widespread posterior rib metastases. Still, no thoracic epidural or extraosseous tumor identified.  Intra-axial partially exophytic solidly enhancing mass of the thoracic spinal cord centered at the T8 level measures 7 x 9 x 17 mm (AP by transverse by CC) and is associated with widespread thoracic spinal cord edema. Only a portion of the conus medullaris, and the ventral lower cervical cord is spared. Mild cord expansion suspected below the level of the tumor.  There is also a much smaller (2 mm) enhancing cord metastasis at the T2-T3 level just to the right of midline (series 10, image 8).  No definite cauda equina tumor. No abnormal dural thickening or enhancement.  Otherwise, bilateral lung nodules are suggested, and central right liver mass may have increased (series 7, image 39). See Chest abdomen pelvis CT 01/14/2014.  IMPRESSION: 1. 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 Associated spinal cord edema is widespread. 2. Diffuse osseous metastatic disease. Study discussed by telephone with Dr. [name] on 01/21/2014 at 15:29 .   Electronically Signed   By: Lars Pinks M.D.   On: 01/21/2014 15:31   Mr Lumbar Spine W Wo Contrast  01/20/2014   CLINICAL DATA:  New onset of lower extremity weakness. Breast cancer.  EXAM: MRI LUMBAR SPINE WITHOUT AND WITH CONTRAST  TECHNIQUE: Multiplanar and multiecho pulse sequences of the lumbar spine were obtained without and with intravenous contrast.  CONTRAST:  47mL MULTIHANCE GADOBENATE  DIMEGLUMINE 529 MG/ML IV SOLN  COMPARISON:  CT ABD/PELVIS W CM dated 01/14/2014; MR HEAD WO/W CM dated 01/20/2014; NM PET IMAGE RESTAG (PS) SKULL BASE TO THIGH dated 10/08/2013  FINDINGS: The scan extends from midportion T11 through the sacrum.  Widespread marrow heterogeneity reflecting osseous metastatic disease and post treatment effect. No areas of pathologic compression deformity are evident. There is no visible epidural tumor. The alignment is anatomic.  No pelvic masses.  Bladder decompressed with Foley catheter.  No disc protrusion or spinal stenosis. Post infusion imaging demonstrates no abnormal intraspinal enhancement of the nerve roots or distal conus.  On sagittal T2 and STIR imaging, the conus appears abnormal, displaying intramedullary T2 bright signal and slight cord enlargement. An intramedullary metastasis above the field-of-view, at T10 or above, is suspected, with associated distal cord edema. There is no definite abnormal enhancement of the visualized thoracic cord or conus on post infusion imaging.  IMPRESSION: Suspected intramedullary metastasis above the field-of-view of lumbar spine MRI. The distal thoracic cord and conus appear to display T2 bright signal concerning for edema. Thoracic spine MRI without and with contrast recommended for further evaluation.  No evidence for spinal stenosis, dropped metastases to the cauda equina, or intraspinal mass lesion in the lumbar or sacral segments.  Widespread osseous disease without pathologic compression fracture or malalignment.   Electronically Signed   By: Rolla Flatten M.D.   On: 01/20/2014 19:19   Dg Chest  Port 1 View  01/29/2014   CLINICAL DATA:  FEVER FEVER  EXAM: PORTABLE CHEST - 1 VIEW  COMPARISON:  MR T SPINE W/CM dated 01/21/2014; CT CHEST W/CM dated 01/14/2014; DG CHEST 1V PORT dated 09/19/2012  FINDINGS: The cardiac silhouette is enlarged. There are no focal infiltrates, effusions, nor edema. Stable prominence of the interstitial markings  is appreciated. A left sided porta catheter is demonstrated via a subclavian approach with tip projecting in the region of the superior vena cava . No acute osseous abnormalities.  IMPRESSION: No evidence of acute cardiopulmonary disease. Chronic interstitial changes.   Electronically Signed   By: Margaree Mackintosh M.D.   On: 01/29/2014 19:50         Subjective:  patient complains of shortness of breath without any chest pain. She denies any nausea, vomiting, diarrhea, abdominal pain. Denies any headache or dizziness. No fevers or chills.  Objective: Filed Vitals:   02/16/14 0700 02/16/14 0711 02/16/14 0800 02/16/14 0900  BP: 130/66  102/60 115/59  Pulse:      Temp:      TempSrc:      Resp: 21  19 16   Height:      Weight:      SpO2:  100%      Intake/Output Summary (Last 24 hours) at 02/16/14 1009 Last data filed at 02/16/14 0600  Gross per 24 hour  Intake   1105 ml  Output   2400 ml  Net  -1295 ml   Weight change: -2.274 kg (-5 lb 0.2 oz) Exam:   General:  Pt is alert, follows commands appropriately, not in acute distress  HEENT: No icterus, No thrush,  [place]/AT  Cardiovascular: RRR, S1/S2, no rubs, no gallops  Respiratory: bilateral scattered rales without wheezing. Good air movement.  Abdomen: Soft/+BS, non tender, non distended, no guarding  Extremities: 1+LE edema, No lymphangitis, No petechiae, No rashes, no synovitis  Data Reviewed: Basic Metabolic Panel:  Recent Labs Lab 02/15/14 1706 02/15/14 1823 02/15/14 2104 02/16/14 0505 02/16/14 0724  NA 116* 116* 118* 119* 118*  K 4.9 4.8 4.1 3.8 3.7  CL 82* 82* 83* 85* 83*  CO2 21 22 22 21 19   GLUCOSE 218* 227* 219* 194* 165*  BUN 6 5* 4* 3* 3*  CREATININE 0.22* 0.22* 0.22* 0.24* 0.25*  CALCIUM 8.3* 8.3* 8.5 8.9 8.7   Liver Function Tests:  Recent Labs Lab 02/14/14 1946  AST 40*  ALT 49*  ALKPHOS 376*  BILITOT 1.1  PROT 6.7  ALBUMIN 2.9*   No results found for this basename: LIPASE, AMYLASE,  in  the last 168 hours No results found for this basename: AMMONIA,  in the last 168 hours CBC:  Recent Labs Lab 02/14/14 1946 02/14/14 2208 02/16/14 0724  WBC 6.8  --  7.1  NEUTROABS 5.7  --   --   HGB 9.6* 10.2* 8.3*  HCT 25.7* 30.0* 23.1*  MCV 74.3*  --  76.7*  PLT 109*  --  113*   Cardiac Enzymes:  Recent Labs Lab 02/14/14 1946  TROPONINI <0.30   BNP: No components found with this basename: POCBNP,  CBG: No results found for this basename: GLUCAP,  in the last 168 hours  Recent Results (from the past 240 hour(s))  CULTURE, BLOOD (ROUTINE X 2)     Status: None   Collection Time    02/14/14  7:46 PM      Result Value Ref Range Status   Specimen Description BLOOD PORTA CATH  Final   Special Requests BOTTLES DRAWN AEROBIC AND ANAEROBIC 6CC   Final   Culture NO GROWTH 2 DAYS   Final   Report Status PENDING   Incomplete  CULTURE, BLOOD (ROUTINE X 2)     Status: None   Collection Time    02/14/14  7:52 PM      Result Value Ref Range Status   Specimen Description BLOOD LEFT ANTECUBITAL   Final   Special Requests BOTTLES DRAWN AEROBIC AND ANAEROBIC 6CC   Final   Culture NO GROWTH 2 DAYS   Final   Report Status PENDING   Incomplete     Scheduled Meds: . B-complex with vitamin C  1 tablet Oral Daily  . calcium-vitamin D  2 tablet Oral Q breakfast  . feeding supplement (RESOURCE BREEZE)  1 Container Oral TID BM  . gabapentin  100 mg Oral BID  . hydrocortisone  20 mg Oral BID  . ipratropium-albuterol  3 mL Nebulization Q6H  . levothyroxine  75 mcg Oral QAC breakfast  . multivitamin with minerals  1 tablet Oral Daily   Continuous Infusions: . dextrose 75 mL/hr at 02/15/14 1700     Orson Eva, DO  [facility] Pager [phone]  If 7PM-7AM, please contact night-coverage [email or web address] Password [email or web address] 02/16/2014, 10:09 AM   LOS: 2 days

## 2014-02-16 NOTE — Progress Notes (Signed)
ANTIBIOTIC CONSULT NOTE - INITIAL  Pharmacy Consult for Vancomycin and Cefepime  Indication: pneumonia  No Known Allergies  Patient Measurements: Height: 5' (152.4 cm) Weight: 98 lb 15.8 oz (44.9 kg) IBW/kg (Calculated) : 45.5   Vital Signs: Temp: 98 F (36.7 C) (04/19 0400) Temp src: Axillary (04/19 0400) BP: 115/56 mmHg (04/19 1000) Intake/Output from previous day: 04/18 0701 - 04/19 0700 In: 2105 [I.V.:2105] Out: 3300 [Urine:3300] Intake/Output from this shift:    Labs:  Recent Labs  02/14/14 1946 02/14/14 2208  02/15/14 0714 02/15/14 0837  02/16/14 0505 02/16/14 0724 02/16/14 0939  WBC 6.8  --   --   --   --   --   --  7.1  --   HGB 9.6* 10.2*  --   --   --   --   --  8.3*  --   PLT 109*  --   --   --   --   --   --  113*  --   LABCREA  --   --   --   --  <10.0  --   --   --   --   CREATININE <0.20* 0.20*  < > 0.21*  --   < > 0.24* 0.25* 0.24*  < > = values in this interval not displayed. Estimated Creatinine Clearance: 51.7 ml/min (by C-G formula based on Cr of 0.24). No results found for this basename: VANCOTROUGH, Corlis Leak, VANCORANDOM, [place], GENTPEAK, GENTRANDOM, TOBRATROUGH, TOBRAPEAK, TOBRARND, AMIKACINPEAK, AMIKACINTROU, AMIKACIN,  in the last 72 hours   Microbiology: Recent Results (from the past 720 hour(s))  CULTURE, BLOOD (ROUTINE X 2)     Status: None   Collection Time    01/29/14  7:50 PM      Result Value Ref Range Status   Specimen Description BLOOD PORTA CATH DRAWN BY RN KB   Final   Special Requests     Final   Value: BOTTLES DRAWN AEROBIC AND ANAEROBIC AEB=8CC ANA=4CC   Culture  Setup Time     Final   Value: 01/30/2014 14:07     Performed at [facility]   Culture     Final   Value: ESCHERICHIA COLI     Note: Gram Stain Report Called to,Read Back By and Verified With: BOBLEE RN ON [id number] AT [place] BY RESSEGGER R     Performed at [facility]   Report Status 02/01/2014 FINAL   Final   Organism ID, Bacteria  ESCHERICHIA COLI   Final  CULTURE, BLOOD (ROUTINE X 2)     Status: None   Collection Time    01/29/14  8:02 PM      Result Value Ref Range Status   Specimen Description BLOOD LEFT HAND   Final   Special Requests BOTTLES DRAWN AEROBIC ONLY 5CC   Final   Culture  Setup Time     Final   Value: 01/30/2014 14:04     Performed at [facility]   Culture     Final   Value: ESCHERICHIA COLI     Note: SUSCEPTIBILITIES PERFORMED ON PREVIOUS CULTURE WITHIN THE LAST 5 DAYS.     Note: Gram Stain Report Called to,Read Back By and Verified With: BOB LEE RN ON [id number] AT 0160 BY RESSEGGER R     Performed at [facility]   Report Status 02/01/2014 FINAL   Final  URINE CULTURE     Status: None   Collection Time    01/29/14  8:08 PM      Result Value Ref Range Status   Specimen Description URINE, CATHETERIZED   Final   Special Requests NONE   Final   Culture  Setup Time     Final   Value: 01/30/2014 14:10     Performed at [place]     Final   Value: >=100,000 COLONIES/ML     Performed at [facility]   Culture     Final   Value: [place]     Performed at [facility]   Report Status 02/02/2014 FINAL   Final   Organism ID, Bacteria ESCHERICHIA COLI   Final   Organism ID, Bacteria CITROBACTER KOSERI   Final  MRSA PCR SCREENING     Status: None   Collection Time    01/29/14 10:28 PM      Result Value Ref Range Status   MRSA by PCR NEGATIVE  NEGATIVE Final   Comment:            The GeneXpert MRSA Assay (FDA     approved for NASAL specimens     only), is one component of a     comprehensive MRSA colonization     surveillance program. It is not     intended to diagnose MRSA     infection nor to guide or     monitor treatment for     MRSA infections.  CULTURE, BLOOD (ROUTINE X 2)     Status: None   Collection Time    02/01/14  1:42 PM      Result Value Ref Range Status   Specimen Description BLOOD PICC  LINE DRAWN BY RN   Final   Special Requests     Final   Value: BOTTLES DRAWN AEROBIC AND ANAEROBIC 8CC EACH BOTTLE   Culture NO GROWTH 5 DAYS   Final   Report Status 02/06/2014 FINAL   Final  CULTURE, BLOOD (ROUTINE X 2)     Status: None   Collection Time    02/01/14  1:55 PM      Result Value Ref Range Status   Specimen Description BLOOD RIGHT ANTECUBITAL   Final   Special Requests     Final   Value: BOTTLES DRAWN AEROBIC AND ANAEROBIC 8CC EACH BOTTLE   Culture NO GROWTH 5 DAYS   Final   Report Status 02/06/2014 FINAL   Final  CULTURE, BLOOD (ROUTINE X 2)     Status: None   Collection Time    02/14/14  7:46 PM      Result Value Ref Range Status   Specimen Description BLOOD PORTA CATH   Final   Special Requests BOTTLES DRAWN AEROBIC AND ANAEROBIC 6CC   Final   Culture NO GROWTH 2 DAYS   Final   Report Status PENDING   Incomplete  CULTURE, BLOOD (ROUTINE X 2)     Status: None   Collection Time    02/14/14  7:52 PM      Result Value Ref Range Status   Specimen Description BLOOD LEFT ANTECUBITAL   Final   Special Requests BOTTLES DRAWN AEROBIC AND ANAEROBIC 6CC   Final   Culture NO GROWTH 2 DAYS   Final   Report Status PENDING   Incomplete    Medical History: Past Medical History  Diagnosis Date  . 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 Hypothyroid   . Enlarged lymph nodes   .  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  . S/P radiation therapy 12/26/01- 02/12/02     Right breast/regional lymph nodes/ 5040 cGy / 28 fractions/ bost of 1200 cGy/ 6 Fractions  . Status post chemotherapy     6 cycles of CMF completed on 12/12/2001  . Right shoulder pain     started late 2012  . Dry cough     intermittent  . SOB (shortness of breath)   . Use of letrozole (Femara) 03/14/2013  . Cholelithiasis   . Anemia   . BREAST CANCER 04/2001    s/p radiation therapy, chemotherapy and lumpectomy  . Metastasis to bone  03/13/13    PET scan results  . Metastasis to bone     breast primary  . Cancer     brain mets  . Lung cancer 10/08/2013  . 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 Stroke   . Hx of radiation therapy 12/25/13- 01/09/14    whole brain 3000 cGy in 10 fractions    Medications:  Scheduled:  . B-complex with vitamin C  1 tablet Oral Daily  . calcium-vitamin D  2 tablet Oral Q breakfast  . ceFEPime (MAXIPIME) IV  1 g Intravenous 3 times per day  . feeding supplement (RESOURCE BREEZE)  1 Container Oral TID BM  . gabapentin  100 mg Oral BID  . hydrocortisone  20 mg Oral BID  . insulin aspart  0-9 Units Subcutaneous TID WC  . ipratropium-albuterol  3 mL Nebulization Q6H  . [START ON 02/17/2014] levothyroxine  100 mcg Oral QAC breakfast  . multivitamin with minerals  1 tablet Oral Daily   Assessment: Okay for Protocol, empirically start the patient on IV antibiotics for treatment of possible infectious process per MD.  Antibiotics  Vancomycin 02/16/14>>>  Cefepime 02/16/14>>>  Goal of Therapy:  Vancomycin trough level 15-20 mcg/ml Eradicate infection.   Plan:  Vancomycin 1000mg  IV every 24 hours Measure antibiotic drug levels at steady state Follow up culture results  Pricilla Larsson 02/16/2014,10:42 AM

## 2014-02-16 NOTE — Progress Notes (Signed)
CRITICAL VALUE ALERT  Critical value received:  Na+116  Date of notification: 02/16/2014  Time of notification: 7614  Critical value read back:yes  Nurse who received alert:  E.Dez Stauffer RN  MD notified (1st page): Tat  Time of first page:  1156  MD notified (2nd page):  Time of second page:  Responding MD:    Time MD responded: Value consistent with previously drawn

## 2014-02-16 NOTE — Progress Notes (Signed)
CRITICAL VALUE ALERT  Critical value received: Na+ 116  Date of notification:  02/16/2014  Time of notification:  7215 am  Critical value read back:yes  Nurse who received alert: E.Lakira Ogando RN  MD notified (1st page): Tat  Time of first page:  10:20am   MD notified (2nd page):  Time of second page:  Responding MD: Tat  Time MD responded:  Value consistent with previously reported lab

## 2014-02-16 NOTE — Progress Notes (Signed)
CRITICAL VALUE ALERT  Critical value received: Na+ 114  Date: 02/16/2014  Time of notification: 1607  Critical value read back:yes  Nurse who received alert:  E.Louella Medaglia RN  MD notified (1st page): Tat  Time of first page:  1607  MD notified (2nd page):  Time of second page:  Responding MD:  Tat  Time MD responded: Value consistent with previous reading

## 2014-02-16 NOTE — Progress Notes (Signed)
CRITICAL VALUE ALERT  Critical value received: Na+ 112  Date of notification:  02/16/2014  Time of notification:  1400  Critical value read back:yes  Nurse who received alert: E.Jupiter Boys RN  MD notified (1st page): Tat  Time of first page: 1404  MD notified (2nd page):  Time of second page:  Responding MD:  Tat  Time MD responded: Value consistent with previously reported

## 2014-02-17 ENCOUNTER — Other Ambulatory Visit (HOSPITAL_COMMUNITY): Payer: Self-pay | Admitting: Hematology and Oncology

## 2014-02-17 ENCOUNTER — Other Ambulatory Visit (HOSPITAL_COMMUNITY): Payer: Self-pay | Admitting: Oncology

## 2014-02-17 DIAGNOSIS — C779 Secondary and unspecified malignant neoplasm of lymph node, unspecified: Secondary | ICD-10-CM

## 2014-02-17 DIAGNOSIS — D696 Thrombocytopenia, unspecified: Secondary | ICD-10-CM

## 2014-02-17 DIAGNOSIS — C787 Secondary malignant neoplasm of liver and intrahepatic bile duct: Secondary | ICD-10-CM

## 2014-02-17 LAB — BASIC METABOLIC PANEL
BUN: 6 mg/dL (ref 6–23)
BUN: 6 mg/dL (ref 6–23)
BUN: 6 mg/dL (ref 6–23)
BUN: 5 mg/dL — ABNORMAL LOW (ref 6–23)
BUN: 5 mg/dL — ABNORMAL LOW (ref 6–23)
BUN: 5 mg/dL — AB (ref 6–23)
BUN: 4 mg/dL — ABNORMAL LOW (ref 6–23)
CALCIUM: 8.3 mg/dL — AB (ref 8.4–10.5)
CALCIUM: 8.1 mg/dL — AB (ref 8.4–10.5)
CALCIUM: 8.3 mg/dL — AB (ref 8.4–10.5)
CHLORIDE: 87 meq/L — AB (ref 96–112)
CHLORIDE: 88 meq/L — AB (ref 96–112)
CHLORIDE: 89 meq/L — AB (ref 96–112)
CHLORIDE: 87 meq/L — AB (ref 96–112)
CHLORIDE: 87 meq/L — AB (ref 96–112)
CHLORIDE: 87 meq/L — AB (ref 96–112)
CO2: 24 mEq/L (ref 19–32)
CO2: 23 mEq/L (ref 19–32)
CO2: 22 meq/L (ref 19–32)
CO2: 21 meq/L (ref 19–32)
CO2: 22 mEq/L (ref 19–32)
CO2: 23 meq/L (ref 19–32)
CO2: 24 meq/L (ref 19–32)
CREATININE: 0.23 mg/dL — AB (ref 0.50–1.10)
CREATININE: 0.24 mg/dL — AB (ref 0.50–1.10)
Calcium: 8.3 mg/dL — ABNORMAL LOW (ref 8.4–10.5)
Calcium: 8.2 mg/dL — ABNORMAL LOW (ref 8.4–10.5)
Calcium: 8.2 mg/dL — ABNORMAL LOW (ref 8.4–10.5)
Calcium: 8.3 mg/dL — ABNORMAL LOW (ref 8.4–10.5)
Chloride: 85 mEq/L — ABNORMAL LOW (ref 96–112)
Creatinine, Ser: 0.27 mg/dL — ABNORMAL LOW (ref 0.50–1.10)
Creatinine, Ser: 0.24 mg/dL — ABNORMAL LOW (ref 0.50–1.10)
Creatinine, Ser: 0.23 mg/dL — ABNORMAL LOW (ref 0.50–1.10)
Creatinine, Ser: 0.24 mg/dL — ABNORMAL LOW (ref 0.50–1.10)
Creatinine, Ser: 0.21 mg/dL — ABNORMAL LOW (ref 0.50–1.10)
GFR calc Af Amer: >90 mL/min (ref >90)
GFR calc Af Amer: >90 mL/min (ref >90)
GFR calc Af Amer: >90 mL/min (ref >90)
GFR calc Af Amer: >90 mL/min (ref >90)
GFR calc Af Amer: >90 mL/min (ref >90)
GFR calc non Af Amer: >90 mL/min (ref >90)
GFR calc non Af Amer: >90 mL/min (ref >90)
GFR calc non Af Amer: >90 mL/min (ref >90)
GFR calc non Af Amer: >90 mL/min (ref >90)
GFR calc non Af Amer: >90 mL/min (ref >90)
GFR calc non Af Amer: >90 mL/min (ref >90)
GLUCOSE: 170 mg/dL — AB (ref 70–99)
GLUCOSE: 222 mg/dL — AB (ref 70–99)
GLUCOSE: 262 mg/dL — AB (ref 70–99)
GLUCOSE: 175 mg/dL — AB (ref 70–99)
Glucose, Bld: 189 mg/dL — ABNORMAL HIGH (ref 70–99)
Glucose, Bld: 145 mg/dL — ABNORMAL HIGH (ref 70–99)
Glucose, Bld: 216 mg/dL — ABNORMAL HIGH (ref 70–99)
POTASSIUM: 3.8 meq/L (ref 3.7–5.3)
POTASSIUM: 3.4 meq/L — AB (ref 3.7–5.3)
Potassium: 3.6 mEq/L — ABNORMAL LOW (ref 3.7–5.3)
Potassium: 3.7 mEq/L (ref 3.7–5.3)
Potassium: 3.7 mEq/L (ref 3.7–5.3)
Potassium: 3.2 mEq/L — ABNORMAL LOW (ref 3.7–5.3)
Potassium: 3.2 mEq/L — ABNORMAL LOW (ref 3.7–5.3)
SODIUM: 124 meq/L — AB (ref 137–147)
Sodium: 122 mEq/L — ABNORMAL LOW (ref 137–147)
Sodium: 122 mEq/L — ABNORMAL LOW (ref 137–147)
Sodium: 123 mEq/L — ABNORMAL LOW (ref 137–147)
Sodium: 123 mEq/L — ABNORMAL LOW (ref 137–147)
Sodium: 123 mEq/L — ABNORMAL LOW (ref 137–147)
Sodium: 124 mEq/L — ABNORMAL LOW (ref 137–147)

## 2014-02-17 LAB — URINE CULTURE: Colony Count: 85000

## 2014-02-17 LAB — CBC
HEMATOCRIT: 20.7 % — AB (ref 36.0–46.0)
HEMOGLOBIN: 7.5 g/dL — AB (ref 12.0–15.0)
MCH: 28.4 pg (ref 26.0–34.0)
MCHC: 36.2 g/dL — ABNORMAL HIGH (ref 30.0–36.0)
MCV: 78.4 fL (ref 78.0–100.0)
Platelets: 108 10*3/uL — ABNORMAL LOW (ref 150–400)
RBC: 2.64 MIL/uL — ABNORMAL LOW (ref 3.87–5.11)
RDW: 17.1 % — AB (ref 11.5–15.5)
WBC: 6.1 10*3/uL (ref 4.0–10.5)

## 2014-02-17 LAB — GLUCOSE, CAPILLARY
Glucose-Capillary: 156 mg/dL — ABNORMAL HIGH (ref 70–99)
Glucose-Capillary: 186 mg/dL — ABNORMAL HIGH (ref 70–99)
Glucose-Capillary: 163 mg/dL — ABNORMAL HIGH (ref 70–99)
Glucose-Capillary: 138 mg/dL — ABNORMAL HIGH (ref 70–99)

## 2014-02-17 LAB — PREPARE RBC (CROSSMATCH)

## 2014-02-17 LAB — ABO/RH: ABO/RH(D): B POS

## 2014-02-17 MED ORDER — COLLAGENASE 250 UNIT/GM EX OINT
TOPICAL_OINTMENT | Freq: Every day | CUTANEOUS | Status: DC
  Administered 2014-02-17: 1 via TOPICAL
  Administered 2014-02-18 – 2014-02-19 (×2): via TOPICAL
  Filled 2014-02-17: qty 30

## 2014-02-17 MED ORDER — DIPHENHYDRAMINE HCL 25 MG PO CAPS: 25.0000 mg | ORAL_CAPSULE | Freq: Once | ORAL | Status: DC

## 2014-02-17 MED ORDER — ACETAMINOPHEN 325 MG PO TABS: 650.0000 mg | ORAL_TABLET | Freq: Once | ORAL | Status: DC

## 2014-02-17 MED ORDER — SODIUM CHLORIDE 0.9 % IJ SOLN: 10.0000 mL | INTRAMUSCULAR | Status: DC | PRN

## 2014-02-17 MED ORDER — POTASSIUM CHLORIDE 20 MEQ/15ML (10%) PO LIQD
40.0000 meq | Freq: Once | ORAL | Status: AC
  Administered 2014-02-17: 40 meq via ORAL
  Filled 2014-02-17: qty 30

## 2014-02-17 MED ORDER — SODIUM CHLORIDE 0.9 % IJ SOLN: 3.0000 mL | INTRAMUSCULAR | Status: DC | PRN

## 2014-02-17 MED ORDER — HEPARIN SOD (PORK) LOCK FLUSH 100 UNIT/ML IV SOLN: 500.0000 [IU] | Freq: Every day | INTRAVENOUS | Status: DC | PRN

## 2014-02-17 MED ORDER — SODIUM CHLORIDE 0.9 % IV SOLN: 250.0000 mL | Freq: Once | INTRAVENOUS | Status: DC

## 2014-02-17 NOTE — Clinical Documentation Improvement (Signed)
  Per Notes patient came to ER with "altered mental status", "profound hyponatremia" with Na+ 112. Please clarify "altered mental status" to reflect severity of illness and risk of mortality. Thank you.  Possible Clinical Conditions?  - Encephalopathy (describe type if known)                       Septic                       Metabolic                       Toxic - Encephalopathy suspected/likely due to Hyponatremia - Other Condition  Thank You, Ezekiel Ina ,RN Clinical Documentation Specialist:  [facility]

## 2014-02-17 NOTE — Consult Note (Signed)
WOC wound consult note; completed via [place] consultation Reason for Consult: evaluation of sacral wound.  Pt with diffuse metastatic disease dx. 6 months ago.  Has been in SNF for rehab. 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   Wound type:Sacral Unstageable pressure ulcer, sDTI (suspected deep tissue injury) left ischial, bilateral heel fissures  Pressure Ulcer POA: Yes x 2  Measurement: Sacrum: 3cm x 6cm x 0.2cm  Left ischial: 0.5cm x 1.0cm x 0 Wound bed: Sacrum: 80% soft black eschar, with 20% pink, mostly noted at the left aspect of the wound.  Heel fissure on the left is stable, the right a little more open with some superficial skin peeling. Left ischial wound 100% dark maroon tissue, not currently open with some surrounding blanchable redness Drainage (amount, consistency, odor) serosanguinous from the sacrum Periwound: intact Dressing procedure/placement/frequency: Will add enzymatic debridement ointment to clean up the sacral wound, cover with moist gauze and ABD.  Foam to protect the right heel and the left ischial wound. Add LALM for pressure redistribution and chair pad for when up in chair to be used when patient dc to SNF as well.  Discussed care of her wounds in depth with patient, education on high protein diet. She is a vegetarian so I would ask RD to adjust diet to increase her protein consumption if possible for wound healing. May want to consider adding MVI, Vit C and zinc for wound healing as well.   Discussed POC with patient and bedside nurse.  Re consult if needed, will not follow at this time. Thanks  Ulises Wolfinger [place], [place] [phone])

## 2014-02-17 NOTE — Consult Note (Signed)
[facility] Consultation Oncology  Name: Debbie Larsen      MRN: [id number]    Location: IC06/IC06-01  Date: 02/17/2014 Time:5:08 PM   REFERRING PHYSICIAN:  Orson Eva, MD  REASON FOR CONSULT:  Management of malignancy and treatment plan.   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   HISTORY OF PRESENT ILLNESS:   Debbie Larsen is a 63 year old [place] woman who is known to the [facility] where she is Day 77 of cycle 1 of Nivolumab therapy (given on 02/05/2014).  She has metastatic poorly differentiated carcinoma, suspect lung primary (versus breast) with bone, mediastinal, lymphangitic spread, intramedullary thoracic spinal cord and brain metastases with a poor prognosis.  She is receiving salvage Nivolumab therapy in an attempt at a response.    She presented to the ED on 02/14/2014 with shortness of breath.  In the ED she was noted to be hyponatremic at 103 and therefore she was admitted to the [facility] for evaluation and management.  I personally reviewed and went over radiographic studies with the patient.  The results are noted within this dictation.    I personally reviewed and went over laboratory results with the patient.  The results are noted within this dictation.  She is noted to be anemic and therefore, I have ordered 2 units PRBCS.  Patient is educated regarding Nivolmab therapy and the expected progression on imaging studies as a result of inflammatory response based on the chemotherapy treatment.  The goal of restaging initially is to evaluate for new lesions.  It is too early to evaluate for disease response.   She has been diagnosed with SIADH and placed on fluid restrictions.  I do not believe she has SIADH with a low urine Na and low osmolality in the setting of hypothyroidism.  Hyponatremia is  likely secondary to hypothyroidism that may have been acute exacerbated by Nivolumab therapy.  With this information, I have discontinued fluid restriction.  She is due for chemotherapy on Wednesday and if she is out of the ICU, and she is stable, we will continue as planned.  Code status was discussed and she wishes to maintain her full code status.  She desires further therapy and since she is tolerating Nivolumab, we will continue and restage her in the future.    PAST MEDICAL HISTORY:   Past Medical History  Diagnosis Date  . 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 Hypothyroid   . Enlarged lymph nodes   . 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  . S/P radiation therapy 12/26/01- 02/12/02     Right breast/regional lymph nodes/ 5040 cGy / 28 fractions/ bost of 1200 cGy/ 6 Fractions  . Status post chemotherapy     6 cycles of CMF completed on 12/12/2001  . Right shoulder pain     started late 2012  . Dry cough     intermittent  . SOB (shortness of breath)   . Use of letrozole (Femara) 03/14/2013  . Cholelithiasis   . Anemia   . BREAST CANCER 04/2001    s/p radiation therapy, chemotherapy and lumpectomy  . Metastasis to bone 03/13/13    PET scan results  . Metastasis to bone     breast primary  . Cancer     brain mets  .  Lung cancer 10/08/2013  . 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 Stroke   . Hx of radiation therapy 12/25/13- 01/09/14    whole brain 3000 cGy in 10 fractions    ALLERGIES: No Known Allergies    MEDICATIONS: I have reviewed the patient's current medications.     PAST SURGICAL HISTORY Past Surgical History  Procedure Laterality Date  . Tonsillectomy    . Breast lumpectomy  04/2001    excision of L breast fibroadenoma by Dr.   . Breast lumpectomy  05/16/2001    Right axillary sentinel lymph node biopsy.  . Portacath placement  09/19/2012    Procedure:  INSERTION PORT-A-CATH;  Surgeon: Haywood Lasso, MD;  Location: [facility];  Service: General;  Laterality: N/A;  . Lipoma excision Right 05/15/2013    Procedure: RIGHT AXILLARY LYMPH NODE DISSECTION;  Surgeon: Haywood Lasso, MD;  Location: [facility];  Service: General;  Laterality: Right;    FAMILY HISTORY: Family History  Problem Relation Age of Onset  . Diabetes Mother   . Hypertension Father   . Hypertension Sister   . Hypothyroidism Brother   . Cancer Neg Hx     SOCIAL HISTORY:  reports that she has never smoked. She has never used smokeless tobacco. She reports that she does not drink alcohol or use illicit drugs.  PERFORMANCE STATUS: The patient's performance status is 4 - Bedbound  PHYSICAL EXAM: Most Recent Vital Signs: Blood pressure 126/81, pulse 105, temperature 98.5 F (36.9 C), temperature source Oral, resp. rate 26, height 5' (1.524 m), weight 100 lb 5 oz (45.5 kg), SpO2 100.00%. General appearance: alert, cooperative, no distress and smiling Head: Normocephalic, without obvious abnormality, atraumatic, alopecia Eyes: negative findings: lids and lashes normal, conjunctivae and sclerae normal and corneas clear Throat: normal findings: buccal mucosa normal Skin: Skin color, texture, turgor normal. No rashes or lesions Neurologic: Grossly normal  LABORATORY DATA:  Results for orders placed during the hospital encounter of 02/14/14 (from the past 48 hour(s))  BASIC METABOLIC PANEL     Status: Abnormal   Collection Time    02/15/14  6:23 PM      Result Value Ref Range   Sodium 116 (*) 137 - 147 mEq/L   Comment: CRITICAL RESULT CALLED TO, READ BACK BY AND VERIFIED WITH:     Z.HAIGHT AT 2011 ON 02/15/14 BY S.VANHOORNE   Potassium 4.8  3.7 - 5.3 mEq/L   Chloride 82 (*) 96 - 112 mEq/L   CO2 22  19 - 32 mEq/L   Glucose, Bld 227 (*) 70 - 99 mg/dL   BUN 5 (*) 6 - 23 mg/dL   Creatinine, Ser 0.22 (*) 0.50 - 1.10 mg/dL   Calcium 8.3 (*) 8.4 - 10.5 mg/dL   GFR calc non Af  Amer >90  >90 mL/min   GFR calc Af Amer >90  >90 mL/min   Comment: (NOTE)     The eGFR has been calculated using the CKD EPI equation.     This calculation has not been validated in all clinical situations.     eGFR's persistently <90 mL/min signify possible Chronic Kidney     Disease.  BASIC METABOLIC PANEL     Status: Abnormal   Collection Time    02/15/14  9:04 PM      Result Value Ref Range   Sodium 118 (*) 137 - 147 mEq/L   Comment: CRITICAL RESULT CALLED TO, READ BACK BY AND VERIFIED WITH:  J.SMITH AT 2211 ON 02/15/14 BY S.VANHOORNE   Potassium 4.1  3.7 - 5.3 mEq/L   Chloride 83 (*) 96 - 112 mEq/L   CO2 22  19 - 32 mEq/L   Glucose, Bld 219 (*) 70 - 99 mg/dL   BUN 4 (*) 6 - 23 mg/dL   Creatinine, Ser 0.22 (*) 0.50 - 1.10 mg/dL   Calcium 8.5  8.4 - 10.5 mg/dL   GFR calc non Af Amer >90  >90 mL/min   GFR calc Af Amer >90  >90 mL/min   Comment: (NOTE)     The eGFR has been calculated using the CKD EPI equation.     This calculation has not been validated in all clinical situations.     eGFR's persistently <90 mL/min signify possible Chronic Kidney     Disease.  BASIC METABOLIC PANEL     Status: Abnormal   Collection Time    02/16/14  5:05 AM      Result Value Ref Range   Sodium 119 (*) 137 - 147 mEq/L   Comment: CRITICAL RESULT CALLED TO, READ BACK BY AND VERIFIED WITH:     SMITH J. AT 2703J ON [id number] BY THOMPSON S.   Potassium 3.8  3.7 - 5.3 mEq/L   Chloride 85 (*) 96 - 112 mEq/L   CO2 21  19 - 32 mEq/L   Glucose, Bld 194 (*) 70 - 99 mg/dL   BUN 3 (*) 6 - 23 mg/dL   Creatinine, Ser 0.24 (*) 0.50 - 1.10 mg/dL   Calcium 8.9  8.4 - 10.5 mg/dL   GFR calc non Af Amer >90  >90 mL/min   GFR calc Af Amer >90  >90 mL/min   Comment: (NOTE)     The eGFR has been calculated using the CKD EPI equation.     This calculation has not been validated in all clinical situations.     eGFR's persistently <90 mL/min signify possible Chronic Kidney     Disease.  BASIC METABOLIC PANEL      Status: Abnormal   Collection Time    02/16/14  7:24 AM      Result Value Ref Range   Sodium 118 (*) 137 - 147 mEq/L   Comment: CRITICAL RESULT CALLED TO, READ BACK BY AND VERIFIED WITH:     STONE A. AT 8299B ON [id number] BY THOMPSON S.   Potassium 3.7  3.7 - 5.3 mEq/L   Chloride 83 (*) 96 - 112 mEq/L   CO2 19  19 - 32 mEq/L   Glucose, Bld 165 (*) 70 - 99 mg/dL   BUN 3 (*) 6 - 23 mg/dL   Creatinine, Ser 0.25 (*) 0.50 - 1.10 mg/dL   Calcium 8.7  8.4 - 10.5 mg/dL   GFR calc non Af Amer >90  >90 mL/min   GFR calc Af Amer >90  >90 mL/min   Comment: (NOTE)     The eGFR has been calculated using the CKD EPI equation.     This calculation has not been validated in all clinical situations.     eGFR's persistently <90 mL/min signify possible Chronic Kidney     Disease.  CBC     Status: Abnormal   Collection Time    02/16/14  7:24 AM      Result Value Ref Range   WBC 7.1  4.0 - 10.5 K/uL   RBC 3.01 (*) 3.87 - 5.11 MIL/uL   Hemoglobin 8.3 (*) 12.0 - 15.0 g/dL  Comment: DELTA CHECK NOTED     SPECIMEN CHECKED FOR CLOTS     REPEATED TO VERIFY   HCT 23.1 (*) 36.0 - 46.0 %   MCV 76.7 (*) 78.0 - 100.0 fL   MCH 27.6  26.0 - 34.0 pg   MCHC 35.9  30.0 - 36.0 g/dL   RDW 16.8 (*) 11.5 - 15.5 %   Platelets 113 (*) 150 - 400 K/uL   Comment: SPECIMEN CHECKED FOR CLOTS     PLATELET COUNT CONFIRMED BY SMEAR  T4, FREE     Status: None   Collection Time    02/16/14  7:24 AM      Result Value Ref Range   Free T4 1.14  0.80 - 1.80 ng/dL   Comment: Performed at [facility]     Status: Abnormal   Collection Time    02/16/14  9:39 AM      Result Value Ref Range   Sodium 116 (*) 137 - 147 mEq/L   Comment: CRITICAL RESULT CALLED TO, READ BACK BY AND VERIFIED WITH:     STONE A. AT 1020A ON [id number] BY THOMPSON S.   Potassium 3.5 (*) 3.7 - 5.3 mEq/L   Chloride 82 (*) 96 - 112 mEq/L   CO2 22  19 - 32 mEq/L   Glucose, Bld 171 (*) 70 - 99 mg/dL   BUN 4 (*) 6 - 23 mg/dL    Creatinine, Ser 0.24 (*) 0.50 - 1.10 mg/dL   Calcium 8.6  8.4 - 10.5 mg/dL   GFR calc non Af Amer >90  >90 mL/min   GFR calc Af Amer >90  >90 mL/min   Comment: (NOTE)     The eGFR has been calculated using the CKD EPI equation.     This calculation has not been validated in all clinical situations.     eGFR's persistently <90 mL/min signify possible Chronic Kidney     Disease.  GLUCOSE, CAPILLARY     Status: Abnormal   Collection Time    02/16/14 11:20 AM      Result Value Ref Range   Glucose-Capillary 152 (*) 70 - 99 mg/dL   Comment 1 Documented in Chart     Comment 2 Notify RN    BASIC METABOLIC PANEL     Status: Abnormal   Collection Time    02/16/14 11:23 AM      Result Value Ref Range   Sodium 116 (*) 137 - 147 mEq/L   Comment: CRITICAL RESULT CALLED TO, READ BACK BY AND VERIFIED WITH:     SPANGLER E. AT 1155A ON [id number] BY THOMPSON S.   Potassium 3.6 (*) 3.7 - 5.3 mEq/L   Chloride 81 (*) 96 - 112 mEq/L   CO2 22  19 - 32 mEq/L   Glucose, Bld 167 (*) 70 - 99 mg/dL   BUN 4 (*) 6 - 23 mg/dL   Creatinine, Ser 0.23 (*) 0.50 - 1.10 mg/dL   Calcium 9.0  8.4 - 10.5 mg/dL   GFR calc non Af Amer >90  >90 mL/min   GFR calc Af Amer >90  >90 mL/min   Comment: (NOTE)     The eGFR has been calculated using the CKD EPI equation.     This calculation has not been validated in all clinical situations.     eGFR's persistently <90 mL/min signify possible Chronic Kidney     Disease.  BASIC METABOLIC PANEL     Status: Abnormal  Collection Time    02/16/14  1:28 PM      Result Value Ref Range   Sodium 112 (*) 137 - 147 mEq/L   Comment: CRITICAL RESULT CALLED TO, READ BACK BY AND VERIFIED WITH:     STONE A. AT 1403 ON [id number] BY THOMPSON S.   Potassium 3.5 (*) 3.7 - 5.3 mEq/L   Chloride 78 (*) 96 - 112 mEq/L   CO2 20  19 - 32 mEq/L   Glucose, Bld 212 (*) 70 - 99 mg/dL   BUN 4 (*) 6 - 23 mg/dL   Creatinine, Ser 0.21 (*) 0.50 - 1.10 mg/dL   Calcium 8.6  8.4 - 10.5 mg/dL   GFR calc non Af  Amer >90  >90 mL/min   GFR calc Af Amer >90  >90 mL/min   Comment: (NOTE)     The eGFR has been calculated using the CKD EPI equation.     This calculation has not been validated in all clinical situations.     eGFR's persistently <90 mL/min signify possible Chronic Kidney     Disease.  BASIC METABOLIC PANEL     Status: Abnormal   Collection Time    02/16/14  3:22 PM      Result Value Ref Range   Sodium 114 (*) 137 - 147 mEq/L   Comment: CRITICAL RESULT CALLED TO, READ BACK BY AND VERIFIED WITH:     SPANGLER E. AT 1607 ON [id number] BY THOMPSON S.   Potassium 3.7  3.7 - 5.3 mEq/L   Chloride 80 (*) 96 - 112 mEq/L   CO2 21  19 - 32 mEq/L   Glucose, Bld 206 (*) 70 - 99 mg/dL   BUN 4 (*) 6 - 23 mg/dL   Creatinine, Ser 0.21 (*) 0.50 - 1.10 mg/dL   Calcium 8.4  8.4 - 10.5 mg/dL   GFR calc non Af Amer >90  >90 mL/min   GFR calc Af Amer >90  >90 mL/min   Comment: (NOTE)     The eGFR has been calculated using the CKD EPI equation.     This calculation has not been validated in all clinical situations.     eGFR's persistently <90 mL/min signify possible Chronic Kidney     Disease.  GLUCOSE, CAPILLARY     Status: Abnormal   Collection Time    02/16/14  5:14 PM      Result Value Ref Range   Glucose-Capillary 168 (*) 70 - 99 mg/dL  BASIC METABOLIC PANEL     Status: Abnormal   Collection Time    02/16/14  9:00 PM      Result Value Ref Range   Sodium 118 (*) 137 - 147 mEq/L   Comment: CRITICAL RESULT CALLED TO, READ BACK BY AND VERIFIED WITH:     J.SMITH AT 2155 ON 02/16/14 BY S.VANHOORNE   Potassium 3.9  3.7 - 5.3 mEq/L   Chloride 82 (*) 96 - 112 mEq/L   CO2 22  19 - 32 mEq/L   Glucose, Bld 161 (*) 70 - 99 mg/dL   BUN 5 (*) 6 - 23 mg/dL   Creatinine, Ser 0.22 (*) 0.50 - 1.10 mg/dL   Calcium 8.6  8.4 - 10.5 mg/dL   GFR calc non Af Amer >90  >90 mL/min   GFR calc Af Amer >90  >90 mL/min   Comment: (NOTE)     The eGFR has been calculated using the CKD EPI equation.     This calculation  has not been validated in all clinical situations.     eGFR's persistently <90 mL/min signify possible Chronic Kidney     Disease.  GLUCOSE, CAPILLARY     Status: Abnormal   Collection Time    02/16/14  9:26 PM      Result Value Ref Range   Glucose-Capillary 162 (*) 70 - 99 mg/dL   Comment 1 Notify RN    BASIC METABOLIC PANEL     Status: Abnormal   Collection Time    02/17/14  4:48 AM      Result Value Ref Range   Sodium 122 (*) 137 - 147 mEq/L   Potassium 3.6 (*) 3.7 - 5.3 mEq/L   Chloride 85 (*) 96 - 112 mEq/L   CO2 24  19 - 32 mEq/L   Glucose, Bld 189 (*) 70 - 99 mg/dL   BUN 6  6 - 23 mg/dL   Creatinine, Ser 0.27 (*) 0.50 - 1.10 mg/dL   Calcium 8.3 (*) 8.4 - 10.5 mg/dL   GFR calc non Af Amer >90  >90 mL/min   GFR calc Af Amer >90  >90 mL/min   Comment: (NOTE)     The eGFR has been calculated using the CKD EPI equation.     This calculation has not been validated in all clinical situations.     eGFR's persistently <90 mL/min signify possible Chronic Kidney     Disease.  GLUCOSE, CAPILLARY     Status: Abnormal   Collection Time    02/17/14  7:54 AM      Result Value Ref Range   Glucose-Capillary 156 (*) 70 - 99 mg/dL   Comment 1 Notify RN    CBC     Status: Abnormal   Collection Time    02/17/14  7:55 AM      Result Value Ref Range   WBC 6.1  4.0 - 10.5 K/uL   RBC 2.64 (*) 3.87 - 5.11 MIL/uL   Hemoglobin 7.5 (*) 12.0 - 15.0 g/dL   HCT 20.7 (*) 36.0 - 46.0 %   MCV 78.4  78.0 - 100.0 fL   MCH 28.4  26.0 - 34.0 pg   MCHC 36.2 (*) 30.0 - 36.0 g/dL   RDW 17.1 (*) 11.5 - 15.5 %   Platelets 108 (*) 150 - 400 K/uL   Comment: SPECIMEN CHECKED FOR CLOTS     PLATELET COUNT CONFIRMED BY SMEAR  BASIC METABOLIC PANEL     Status: Abnormal   Collection Time    02/17/14  7:55 AM      Result Value Ref Range   Sodium 122 (*) 137 - 147 mEq/L   Potassium 3.8  3.7 - 5.3 mEq/L   Chloride 87 (*) 96 - 112 mEq/L   CO2 23  19 - 32 mEq/L   Glucose, Bld 170 (*) 70 - 99 mg/dL   BUN 6  6  - 23 mg/dL   Creatinine, Ser 0.24 (*) 0.50 - 1.10 mg/dL   Calcium 8.3 (*) 8.4 - 10.5 mg/dL   GFR calc non Af Amer >90  >90 mL/min   GFR calc Af Amer >90  >90 mL/min   Comment: (NOTE)     The eGFR has been calculated using the CKD EPI equation.     This calculation has not been validated in all clinical situations.     eGFR's persistently <90 mL/min signify possible Chronic Kidney     Disease.  BASIC METABOLIC PANEL     Status:  Abnormal   Collection Time    02/17/14  9:17 AM      Result Value Ref Range   Sodium 123 (*) 137 - 147 mEq/L   Potassium 3.7  3.7 - 5.3 mEq/L   Chloride 88 (*) 96 - 112 mEq/L   CO2 22  19 - 32 mEq/L   Glucose, Bld 145 (*) 70 - 99 mg/dL   BUN 6  6 - 23 mg/dL   Creatinine, Ser 0.23 (*) 0.50 - 1.10 mg/dL   Calcium 8.2 (*) 8.4 - 10.5 mg/dL   GFR calc non Af Amer >90  >90 mL/min   GFR calc Af Amer >90  >90 mL/min   Comment: (NOTE)     The eGFR has been calculated using the CKD EPI equation.     This calculation has not been validated in all clinical situations.     eGFR's persistently <90 mL/min signify possible Chronic Kidney     Disease.  GLUCOSE, CAPILLARY     Status: Abnormal   Collection Time    02/17/14 11:39 AM      Result Value Ref Range   Glucose-Capillary 186 (*) 70 - 99 mg/dL   Comment 1 Notify RN    BASIC METABOLIC PANEL     Status: Abnormal   Collection Time    02/17/14 11:42 AM      Result Value Ref Range   Sodium 124 (*) 137 - 147 mEq/L   Potassium 3.7  3.7 - 5.3 mEq/L   Chloride 89 (*) 96 - 112 mEq/L   CO2 21  19 - 32 mEq/L   Glucose, Bld 222 (*) 70 - 99 mg/dL   BUN 5 (*) 6 - 23 mg/dL   Creatinine, Ser 0.23 (*) 0.50 - 1.10 mg/dL   Calcium 8.1 (*) 8.4 - 10.5 mg/dL   GFR calc non Af Amer >90  >90 mL/min   GFR calc Af Amer >90  >90 mL/min   Comment: (NOTE)     The eGFR has been calculated using the CKD EPI equation.     This calculation has not been validated in all clinical situations.     eGFR's persistently <90 mL/min signify  possible Chronic Kidney     Disease.  BASIC METABOLIC PANEL     Status: Abnormal   Collection Time    02/17/14 12:45 PM      Result Value Ref Range   Sodium 123 (*) 137 - 147 mEq/L   Potassium 3.4 (*) 3.7 - 5.3 mEq/L   Chloride 87 (*) 96 - 112 mEq/L   CO2 22  19 - 32 mEq/L   Glucose, Bld 262 (*) 70 - 99 mg/dL   BUN 5 (*) 6 - 23 mg/dL   Creatinine, Ser 0.24 (*) 0.50 - 1.10 mg/dL   Calcium 8.2 (*) 8.4 - 10.5 mg/dL   GFR calc non Af Amer >90  >90 mL/min   GFR calc Af Amer >90  >90 mL/min   Comment: (NOTE)     The eGFR has been calculated using the CKD EPI equation.     This calculation has not been validated in all clinical situations.     eGFR's persistently <90 mL/min signify possible Chronic Kidney     Disease.  BASIC METABOLIC PANEL     Status: Abnormal   Collection Time    02/17/14  3:27 PM      Result Value Ref Range   Sodium 123 (*) 137 - 147 mEq/L   Potassium 3.2 (*)  3.7 - 5.3 mEq/L   Chloride 87 (*) 96 - 112 mEq/L   CO2 23  19 - 32 mEq/L   Glucose, Bld 216 (*) 70 - 99 mg/dL   BUN 5 (*) 6 - 23 mg/dL   Creatinine, Ser 0.21 (*) 0.50 - 1.10 mg/dL   Calcium 8.3 (*) 8.4 - 10.5 mg/dL   GFR calc non Af Amer >90  >90 mL/min   GFR calc Af Amer >90  >90 mL/min   Comment: (NOTE)     The eGFR has been calculated using the CKD EPI equation.     This calculation has not been validated in all clinical situations.     eGFR's persistently <90 mL/min signify possible Chronic Kidney     Disease.  GLUCOSE, CAPILLARY     Status: Abnormal   Collection Time    02/17/14  4:45 PM      Result Value Ref Range   Glucose-Capillary 163 (*) 70 - 99 mg/dL   Comment 1 Notify RN        RADIOGRAPHY: Ct Angio Chest Pe W/cm &/or Wo Cm  02/15/2014   CLINICAL DATA:  Breast cancer. Status post lumpectomy, chemotherapy and radiation. Metastases to lung, bone and brain. Tachycardia, shortness of breath.  EXAM: CT ANGIOGRAPHY CHEST WITH CONTRAST  TECHNIQUE: Multidetector CT imaging of the chest was  performed using the standard protocol during bolus administration of intravenous contrast. Multiplanar CT image reconstructions and MIPs were obtained to evaluate the vascular anatomy.  CONTRAST:  148m OMNIPAQUE IOHEXOL 350 MG/ML SOLN  COMPARISON:  01/14/2014  FINDINGS: Left-sided Port-A-Cath is in place with the tip in the SVC. Heart is borderline in size. There is a small pericardial effusion. Aorta is normal caliber.  6 mm prevascular lymph node on image 25. Abnormal soft tissue in the right paratracheal region is difficult to measure as this appears to be rather diffuse and ill-defined. This has increased since prior study.  Right lower lobe pulmonary nodule on image 49 measures 7 mm, not significantly changed. Nodular area in the left upper lobe in the perihilar region on image 36 measures 10 mm compared with 9 mm previously, not significantly changed. Areas of peripheral ground-glass airspace opacity noted peripherally in the upper lobes bilaterally, increased since prior study. There are numerous small somewhat ill-defined lower lobe pulmonary nodular areas, many of which are new since prior study. These are small, 3-4 mm or less in size. .Marland Kitchen Postsurgical changes in the right breast and axilla.  Diffuse sclerotic lesions throughout the thoracic spine compatible with metastases. Multiple bilateral rib lesions.  Multiple rim enhancing lesions within the liver. 2.4 cm lesion within the right hepatic lobe compared with 1.8 cm previously. 1.8 cm lesion in the left hepatic lobe on image 69 compared with 8 mm previously. The other lesions in the dome with the liver are not as well visualized on this Chest CT due to bolus timing.  Review of the MIP images confirms the above findings.  IMPRESSION: New patchy ground-glass airspace opacities within the upper lobes bilaterally peripherally. There are also numerous small ill-defined subcentimeric pulmonary nodules in the lower lobes. Previously seen nodular densities in  the lungs are relatively stable. Overall process in the lungs could reflect lymphangitic spread of tumor, atypical infections or drug toxicity. Pulmonary nodularity scratch head the pulmonary nodules are concerning for metastases.  Small pericardial effusion.  Ill-defined abnormal soft tissue in the mediastinum, predominantly in the paratracheal region, difficult to measure due to its indistinct  appearance.  Slight enlargement of at least 2 hepatic metastases.  Stable diffuse bony metastases.   Electronically Signed   By: Rolm Baptise M.D.   On: 02/15/2014 20:09       PATHOLOGY:  Nothing new   ASSESSMENT:  1. Hyponatremia, likely iatrogenic hypothyroidism induced with a low urine Na and low osmolality consistent with the effects of hypothyroidism.  Cannot call this SIADH in the setting of frank hypothyroidism.  Addison's disease is less likely given K+ is WNL. 2. Anemia, normocytic, hyperchromic.   3. Thrombocytopenia, stable. 4. 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 Day 13 of cycle 1 of Nivolumab therapy (given on 02/05/2014).  Due for next infusion on 02/19/2014. 5. Realistic expectations, but not making appropriate goal of care decisions, based upon her poor prognosis.  She wishes to continue treatment as planned.  6. FULL CODE   PLAN:  1. I personally reviewed and went over laboratory results with the patient.  The results are noted within this dictation. 2. I personally reviewed and went over radiographic studies with the patient.  The results are noted within this dictation.   3. 2 unit [place] transfusion tonight/tomorrow 4. Pre-chemo labs on Tuesday: CBC diff, CMET 5. Suspect hyponatremia in the setting of iatrogenic hypothyroidism (Nivolumab-induced) with low urine sodium and low osmolality.  Cannot call this SIADH in the setting of hypothyroidism.   K+ not elevating making Addison's disease unlikely. 6. She is due for cycle 2 of Nivolumab therapy on Wednesday.  We will continue as planned if she is not in the ICU. 7. Code status discussed, she wants to remain a FULL CODE 8. Prognosis is poor.  Patient wishes to continue with treatment.   9. Labs today: Anemia panel 10. I discussed the patient's case with her brother with the patient's permission in private.  He is educated about the patient's prognosis.  He is very reasonable. 11. Will follow along as an inpatient.  All questions were answered. The patient knows to call the clinic with any problems, questions or concerns. We can certainly see the patient much sooner if necessary.  Patient and plan discussed with Dr. Farrel Gobble and he is in agreement with the aforementioned.   [facility] 02/17/2014

## 2014-02-17 NOTE — Care Management Utilization Note (Signed)
UR completed 

## 2014-02-17 NOTE — Progress Notes (Signed)
Pt requested that she get a second opinion from another MD. I informed her that I would make a note, and the oncoming MD team would address her concerns.  Education was reinforced regarding free water consumption.  Pt also had a 6cm x 4cm unstageable wound to her sacrum. Pt states that she had this while at [facility]. I talked to the MD that admitted her, and she confirmed this.  MD also gave a verbal order for [place] consult.

## 2014-02-17 NOTE — Progress Notes (Signed)
INITIAL NUTRITION ASSESSMENT  DOCUMENTATION CODES Per approved criteria  -Severe malnutrition in the context of chronic illness   Pt meets criteria for severe MALNUTRITION in the context of chronic illness as evidenced by <75% estimated energy intake x 1 month, 19% wt loss x 6 months.  INTERVENTION: Continue with current nutrition plan of care Work with nutritional services to obtain food preferences  NUTRITION DIAGNOSIS: Inadequate oral intake related to increased nutritional needs as evidenced by hx poor po intake, unstageable sacral wound.   Goal: Pt will meet >90% of estimated nutritional needs  Monitor:  PO intake, labs, skin assessments, weight changes, I/O's  Reason for Assessment: MD consult to assess needs  63 y.o. female  Admitting Dx: Hyponatremia  ASSESSMENT: Pt admitted from [facility] due to AMS secondary to hyponatremia.  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 Her last chemotherapy/ immunosuppressant infusion was 2 weeks ago. Per H&P, pt with very poor prognosis, but in denial about the severity of her illness. Pt is vegetarian, reporting she eats only yogurt, eggs, milk, and cottage cheese for protein sources ("isn't that enough?"). She has very specific requests for meals ("I don't like the red sauce with spaghetti, I'd rather have penne with a white sauce or an alfredo lean cuisine. I ate that yesterday" and "I'd rather have [employer] brand yogurt"). She reports she is on multiple supplements, including B-12, calcium, and multivitamin. Previous RD notes from previous hospitalizations reveal that pt has trialed multiple supplements, however, she does not like many. She also has Resource [employer]reeze po TID ordered, each supplement provides 250 kcal and 9 grams of protein. She reports compliance with supplements, reporting she has already drank [employer] twice today. She reports appetite has improved during hospitalization, stating "good"  appetite presently and "fair" appetite PTA, despite unattempted meal tray on table.  Pt with multiple nutrition related questions today. She is very concerned about her sodium levels and reports "I should be drinking a lot of chicken broth with salt. I think that's good for me". She also requests "tell me what to eat for breakfast, lunch, and dinner". Diet recall reveals eggs for breakfast, salad and vegetables for lunch and dinner. Educated pt on importance of eating high protein foods and encouraged her to eat her protein foods first. Gave specific examples of protein contents in foods that she eats by helping pt read labels of protein contents of foods on her tray, so she had a better understanding of how much she needed to eat in order to fulfill her protein needs. Pt very appreciative of information.  Wt hx reveals progressive weight loss, including 18% weight loss x 1 year. Also reveals a 19% wt loss x 6 months, 14% wt loss x 3 months, and 7% wt loss x 1 month, which are all clinically significant.   Height: Ht Readings from Last 1 Encounters:  02/14/14 5' (1.524 m)    Weight: Wt Readings from Last 1 Encounters:  02/17/14 100 lb 5 oz (45.5 kg)    Ideal Body Weight: 100#  % Ideal Body Weight: 100%  Wt Readings from Last 10 Encounters:  02/17/14 100 lb 5 oz (45.5 kg)  01/29/14 104 lb 15 oz (47.6 kg)  01/28/14 114 lb 10.2 oz (52 kg)  01/06/14 107 lb 1.6 oz (48.58 kg)  12/30/13 108 lb 1.6 oz (49.034 kg)  12/23/13 112 lb (50.803 kg)  12/18/13 112 lb (50.803 kg)  12/06/13 113 lb (51.256 kg)  11/20/13 114 lb (51.71 kg)  11/13/13 114 lb 4.8 oz (51.846 kg)    Usual Body Weight: 130#  % Usual Body Weight: 77%  BMI:  Body mass index is 19.59 kg/(m^2). Meets criteria for normal weight.   Estimated Nutritional Needs: Kcal: 1610-1840 daily Protein: 58-69 grams daily Fluid: 1.6-1.88 L daily  Skin: unstagebale wounds on sacrum and buttocks  Diet Order: General  EDUCATION  NEEDS: -Education needs addressed   Intake/Output Summary (Last 24 hours) at 02/17/14 1325 Last data filed at 02/17/14 1200  Gross per 24 hour  Intake 1547.5 ml  Output   2850 ml  Net -1302.5 ml    Last BM: 02/17/14   Labs:   Recent Labs Lab 02/17/14 0917 02/17/14 1142 02/17/14 1245  NA 123* 124* 123*  K 3.7 3.7 3.4*  CL 88* 89* 87*  CO2 22 21 22   BUN 6 5* 5*  CREATININE 0.23* 0.23* 0.24*  CALCIUM 8.2* 8.1* 8.2*  GLUCOSE 145* 222* 262*    CBG (last 3)   Recent Labs  02/16/14 2126 02/17/14 0754 02/17/14 1139  GLUCAP 162* 156* 186*    Scheduled Meds: . B-complex with vitamin C  1 tablet Oral Daily  . calcium-vitamin D  2 tablet Oral Q breakfast  . ceFEPime (MAXIPIME) IV  1 g Intravenous 3 times per day  . collagenase   Topical Daily  . feeding supplement (RESOURCE BREEZE)  1 Container Oral TID BM  . gabapentin  100 mg Oral BID  . hydrocortisone  20 mg Oral BID  . insulin aspart  0-9 Units Subcutaneous TID WC  . ipratropium-albuterol  3 mL Nebulization Q6H  . levothyroxine  100 mcg Oral QAC breakfast  . multivitamin with minerals  1 tablet Oral Daily  . vancomycin  1,000 mg Intravenous Q24H    Continuous Infusions: . sodium chloride 75 mL/hr at 02/17/14 1200    Past Medical History  Diagnosis Date  . 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 Hypothyroid   . Enlarged lymph nodes   . 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  . S/P radiation therapy 12/26/01- 02/12/02     Right breast/regional lymph nodes/ 5040 cGy / 28 fractions/ bost of 1200 cGy/ 6 Fractions  . Status post chemotherapy     6 cycles of CMF completed on 12/12/2001  . Right shoulder pain     started late 2012  . Dry cough     intermittent  . SOB (shortness of breath)   . Use of letrozole (Femara) 03/14/2013  . Cholelithiasis   . Anemia   . BREAST CANCER 04/2001    s/p radiation therapy, chemotherapy and  lumpectomy  . Metastasis to bone 03/13/13    PET scan results  . Metastasis to bone     breast primary  . Cancer     brain mets  . Lung cancer 10/08/2013  . 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 Stroke   . Hx of radiation therapy 12/25/13- 01/09/14    whole brain 3000 cGy in 10 fractions    Past Surgical History  Procedure Laterality Date  . Tonsillectomy    . Breast lumpectomy  04/2001    excision of L breast fibroadenoma by Dr.   . Breast lumpectomy  05/16/2001    Right axillary sentinel lymph node biopsy.  . Portacath placement  09/19/2012    Procedure: INSERTION PORT-A-CATH;  Surgeon: Haywood Lasso, MD;  Location: [facility];  Service: General;  Laterality: N/A;  . Lipoma excision Right 05/15/2013    Procedure: RIGHT AXILLARY LYMPH NODE DISSECTION;  Surgeon: Haywood Lasso, MD;  Location: [facility];  Service: General;  Laterality: Right;    Brittyn Salaz A. Jimmye Norman, RD, LDN Pager: [phone]

## 2014-02-17 NOTE — Care Management Note (Addendum)
    Page 1 of 1   02/20/2014     11:59:31 AM CARE MANAGEMENT NOTE 02/20/2014  Patient:  AMYRAH, PINKHASOV   Account Number:  [id number]  Date Initiated:  02/17/2014  Documentation initiated by:  Theophilus Kinds  Subjective/Objective Assessment:   Pt admitted from [facility] with hyponatremia. Pt will return to facility at discharge.     Action/Plan:   CSW to arrange discharge to facility when medically stable.   Anticipated DC Date:  02/24/2014   Anticipated DC Plan:  SKILLED NURSING FACILITY  In-house referral  Clinical Social Worker      DC Planning Services  CM consult      Choice offered to / List presented to:             Status of service:  Completed, signed off Medicare Important Message given?   (If response is "NO", the following Medicare IM given date fields will be blank) Date Medicare IM given:   Date Additional Medicare IM given:    Discharge Disposition:  [place]  Per UR Regulation:    If discussed at Long Length of Stay Meetings, dates discussed:   02/20/2014    Comments:  02/17/14 [place], RN BSN CM

## 2014-02-17 NOTE — Progress Notes (Addendum)
PROGRESS NOTE  Debbie Larsen HWE:[id number] DOB: [birth year] DOA: 02/14/2014 PCP: Melrose Nakayama, MD  Assessment/Plan: Altered mental status  -Secondary to hyponatremia  -Improved with correction of hyponatremia  -Serum appears to have stabilized in the low 120s Hyponatremia  -Likely due to SIADH with a combination of poor solute intake and volume depletion  -FeNa 0.35%  -Na increasing even after stopping NS  -start D5W to avoid rapid sodium increase  -TSH--15.36  -Uric acid 2.1--further validates component of SIADH  -Urine Osm 145, sOsm 223  -Continue every 2 hour BMPs  Metastatic poorly differentiated carcinoma  -Metastasis to lung, brain, spinal cord, liver  -Suspect lung versus breast primary  -Patient is in denial regarding prognosis  -Most recently received Nivolumab on 02/05/14  -Consult the patient's oncologist when he returns on 02/17/2014  -Patient continues to have unrealistic expectations  -After discussion with the patient, she has decided to continue full CODE STATUS Tachycardia/dyspnea  -CT angiogram chest--negative for PE but shows increased bilateral upper lobe groundglass opacities with new ill-defined lower lobe nodular areas with considerations for infectious, inflammatory, and neoplastic etiologies-- also revealed increased size of liver lesions  -Empirically start the patient on IV antibiotics for treatment of possible infectious process  -EKG--sinus tachycardia, nonspecific ST-T wave change  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  -Continue Cortef  Sacral decubitus ulcer -Counsulted wound care -Air mattress Impaired glucose tolerance  -Secondary to chronic steroid use -01/31/2014 hemoglobin A1c 6.5 -NovoLog sliding scale  Hypothyroidism  -Continue Synthroid  -TSH 15.36  -increase synthroid 100 mcg daily Severe protein calorie malnutrition  -Nutritional supplementation  Family Communication: Brother and sister updated Disposition  Plan: Home when medically stable  Antibiotics  Vancomycin 02/16/14>>>  Cefepime 02/16/14>>>          Procedures/Studies: Dg Chest 2 View  02/14/2014   CLINICAL DATA:  Shortness of breath, metastatic breast cancer.  EXAM: CHEST  2 VIEW  COMPARISON:  DG CHEST 1V PORT dated 01/29/2014; CT CHEST W/CM dated 01/14/2014; CT CHEST W/CM dated 07/03/2013  FINDINGS: Trachea is midline. Left subclavian power port tip is in the SVC. Heart size stable. Pulmonary parenchymal lesions seen on 01/14/2014 are poorly appreciated. No pleural fluid. Surgical clips in the right axilla.  IMPRESSION: Pulmonary parenchymal lesions seen on 01/14/2014 are poorly appreciated on the current exam. No large areas of airspace consolidation. No pleural fluid.   Electronically Signed   By: Lorin Picket M.D.   On: 02/14/2014 20:29   Ct Angio Chest Pe W/cm &/or Wo Cm  02/15/2014   CLINICAL DATA:  Breast cancer. Status post lumpectomy, chemotherapy and radiation. Metastases to lung, bone and brain. Tachycardia, shortness of breath.  EXAM: CT ANGIOGRAPHY CHEST WITH CONTRAST  TECHNIQUE: Multidetector CT imaging of the chest was performed using the standard protocol during bolus administration of intravenous contrast. Multiplanar CT image reconstructions and MIPs were obtained to evaluate the vascular anatomy.  CONTRAST:  144mL OMNIPAQUE IOHEXOL 350 MG/ML SOLN  COMPARISON:  01/14/2014  FINDINGS: Left-sided Port-A-Cath is in place with the tip in the SVC. Heart is borderline in size. There is a small pericardial effusion. Aorta is normal caliber.  6 mm prevascular lymph node on image 25. Abnormal soft tissue in the right paratracheal region is difficult to measure as this appears to be rather diffuse and ill-defined. This has increased since prior study.  Right lower lobe pulmonary nodule on image 49 measures 7 mm, not significantly changed.  Nodular area in the left upper lobe in the perihilar region on image 36 measures 10 mm compared with 9  mm previously, not significantly changed. Areas of peripheral ground-glass airspace opacity noted peripherally in the upper lobes bilaterally, increased since prior study. There are numerous small somewhat ill-defined lower lobe pulmonary nodular areas, many of which are new since prior study. These are small, 3-4 mm or less in size. Marland Kitchen  Postsurgical changes in the right breast and axilla.  Diffuse sclerotic lesions throughout the thoracic spine compatible with metastases. Multiple bilateral rib lesions.  Multiple rim enhancing lesions within the liver. 2.4 cm lesion within the right hepatic lobe compared with 1.8 cm previously. 1.8 cm lesion in the left hepatic lobe on image 69 compared with 8 mm previously. The other lesions in the dome with the liver are not as well visualized on this Chest CT due to bolus timing.  Review of the MIP images confirms the above findings.  IMPRESSION: New patchy ground-glass airspace opacities within the upper lobes bilaterally peripherally. There are also numerous small ill-defined subcentimeric pulmonary nodules in the lower lobes. Previously seen nodular densities in the lungs are relatively stable. Overall process in the lungs could reflect lymphangitic spread of tumor, atypical infections or drug toxicity. Pulmonary nodularity scratch head the pulmonary nodules are concerning for metastases.  Small pericardial effusion.  Ill-defined abnormal soft tissue in the mediastinum, predominantly in the paratracheal region, difficult to measure due to its indistinct appearance.  Slight enlargement of at least 2 hepatic metastases.  Stable diffuse bony metastases.   Electronically Signed   By: Rolm Baptise M.D.   On: 02/15/2014 20:09   Mr Jeri Cos FI Contrast  01/20/2014   CLINICAL DATA:  Breast cancer.  Evaluate for metastatic disease.  EXAM: MRI HEAD WITHOUT AND WITH CONTRAST  TECHNIQUE: Multiplanar, multiecho pulse sequences of the brain and surrounding structures were obtained without  and with intravenous contrast.  CONTRAST:  MultiHance 9 mL.  COMPARISON:  MR HEAD WO/W CM dated 12/18/2013; MR C SPINE WO/W CM dated 02/21/2013  FINDINGS: No acute stroke or hemorrhage. No hydrocephalus or extra-axial fluid.  Intracranial metastatic disease is re-demonstrated, as was observed on the previous MR. Subcentimeter lesions are seen in the right cerebellar hemisphere, inferior vermis, right temporal lobe, left temporal lobe, and multiple lesions throughout the subcortical white matter of left greater than right cerebral hemispheres. The dominant lesion is an osseous metastasis to the greater wing of the sphenoid on the right, measures 17 x 19 mm, unchanged in size from priors. A dural based lesion is also seen over the right frontal convexity, similar to priors. No midline shift. No impending herniation. Moderate vasogenic edema is associated with the right temporal lesion, increased from priors, and could predispose the patient to seizures. Other lesions also are roughly similar, including a spherical 7 mm lesion deep to the insula. There is slight increased edema surrounding the right cerebellar lesion compared with priors, but there is slightly less edema surrounding the left parietal subcortical lesion. A few more lesions are visible than were previously noted on the 12/18/2013 study, but there was considerable motion on that exam.  Marrow heterogeneity in the clivus could suggest additional tumor involvement. Low T1 signal intensity bone marrow in the upper cervical region unchanged from priors.  IMPRESSION: Suspected slight progression of metastatic disease, with a few more lesions visible than were observed on 12/18/2013, as well as increased vasogenic edema surrounding the right middle cranial fossa osseous lesion,  and right cerebellar lesion. No impending herniation or midline shift.  No lesions involving the parasagittal posterior frontal cortex which might cause bilateral leg weakness.    Electronically Signed   By: Rolla Flatten M.D.   On: 01/20/2014 20:09   Mr Thoracic Spine W Contrast  01/21/2014   CLINICAL DATA:  63 year old female with stage IV breast cancer. New onset lower extremity weakness and unable to move the lower extremities. Initial encounter.  EXAM: MRI THORACIC SPINE WITH CONTRAST  TECHNIQUE: Multiplanar and multiecho pulse sequences of the thoracic spine were obtained with intravenous contrast.  CONTRAST:  55mL MULTIHANCE GADOBENATE DIMEGLUMINE 529 MG/ML IV SOLN  COMPARISON:  Lumbar MRI 01/20/2014.  FINDINGS: Diffusely abnormal bone marrow signal. Subsequently, cervical vertebral delineation is difficult on the sagittal scout view. The numbering system here agrees with that on the recent lumbar exam.  Diffuse thoracic spine and widespread posterior rib metastases. Still, no thoracic epidural or extraosseous tumor identified.  Intra-axial partially exophytic solidly enhancing mass of the thoracic spinal cord centered at the T8 level measures 7 x 9 x 17 mm (AP by transverse by CC) and is associated with widespread thoracic spinal cord edema. Only a portion of the conus medullaris, and the ventral lower cervical cord is spared. Mild cord expansion suspected below the level of the tumor.  There is also a much smaller (2 mm) enhancing cord metastasis at the T2-T3 level just to the right of midline (series 10, image 8).  No definite cauda equina tumor. No abnormal dural thickening or enhancement.  Otherwise, bilateral lung nodules are suggested, and central right liver mass may have increased (series 7, image 39). See Chest abdomen pelvis CT 01/14/2014.  IMPRESSION: 1. 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 Associated spinal cord edema is widespread. 2. Diffuse osseous metastatic disease. Study discussed by telephone with Dr. [name] on 01/21/2014 at 15:29 .   Electronically Signed   By: Lars Pinks M.D.   On: 01/21/2014 15:31   Mr Lumbar  Spine W Wo Contrast  01/20/2014   CLINICAL DATA:  New onset of lower extremity weakness. Breast cancer.  EXAM: MRI LUMBAR SPINE WITHOUT AND WITH CONTRAST  TECHNIQUE: Multiplanar and multiecho pulse sequences of the lumbar spine were obtained without and with intravenous contrast.  CONTRAST:  60mL MULTIHANCE GADOBENATE DIMEGLUMINE 529 MG/ML IV SOLN  COMPARISON:  CT ABD/PELVIS W CM dated 01/14/2014; MR HEAD WO/W CM dated 01/20/2014; NM PET IMAGE RESTAG (PS) SKULL BASE TO THIGH dated 10/08/2013  FINDINGS: The scan extends from midportion T11 through the sacrum.  Widespread marrow heterogeneity reflecting osseous metastatic disease and post treatment effect. No areas of pathologic compression deformity are evident. There is no visible epidural tumor. The alignment is anatomic.  No pelvic masses.  Bladder decompressed with Foley catheter.  No disc protrusion or spinal stenosis. Post infusion imaging demonstrates no abnormal intraspinal enhancement of the nerve roots or distal conus.  On sagittal T2 and STIR imaging, the conus appears abnormal, displaying intramedullary T2 bright signal and slight cord enlargement. An intramedullary metastasis above the field-of-view, at T10 or above, is suspected, with associated distal cord edema. There is no definite abnormal enhancement of the visualized thoracic cord or conus on post infusion imaging.  IMPRESSION: Suspected intramedullary metastasis above the field-of-view of lumbar spine MRI. The distal thoracic cord and conus appear to display T2 bright signal concerning for edema. Thoracic spine MRI without and with contrast recommended for further evaluation.  No evidence for spinal stenosis, dropped metastases to the cauda equina, or intraspinal mass lesion in the lumbar or sacral segments.  Widespread osseous disease without pathologic compression fracture or malalignment.   Electronically Signed   By: Rolla Flatten M.D.   On: 01/20/2014 19:19   Dg Chest Port 1 View  01/29/2014    CLINICAL DATA:  FEVER FEVER  EXAM: PORTABLE CHEST - 1 VIEW  COMPARISON:  MR T SPINE W/CM dated 01/21/2014; CT CHEST W/CM dated 01/14/2014; DG CHEST 1V PORT dated 09/19/2012  FINDINGS: The cardiac silhouette is enlarged. There are no focal infiltrates, effusions, nor edema. Stable prominence of the interstitial markings is appreciated. A left sided porta catheter is demonstrated via a subclavian approach with tip projecting in the region of the superior vena cava . No acute osseous abnormalities.  IMPRESSION: No evidence of acute cardiopulmonary disease. Chronic interstitial changes.   Electronically Signed   By: Margaree Mackintosh M.D.   On: 01/29/2014 19:50         Subjective: Patient denies any fevers, chills, chest discomfort, shortness breath, nausea, vomiting, diarrhea. No abdominal pain. Tolerating diet. Denies any headache, dizziness, visual disturbance  Objective: Filed Vitals:   02/17/14 1340 02/17/14 1400 02/17/14 1500 02/17/14 1600  BP:    126/81  Pulse:      Temp:      TempSrc:      Resp:  26 23 26   Height:      Weight:      SpO2: 100%       Intake/Output Summary (Last 24 hours) at 02/17/14 1640 Last data filed at 02/17/14 1600  Gross per 24 hour  Intake 3137.5 ml  Output   2850 ml  Net  287.5 ml   Weight change: 0.6 kg (1 lb 5.2 oz) Exam:   General:  Pt is alert, follows commands appropriately, not in acute distress  HEENT: No icterus, No thrush, [place]/AT  Cardiovascular: RRR, S1/S2, no rubs, no gallops  Respiratory: Bilateral scattered rales. No wheezing.  Abdomen: Soft/+BS, non tender, non distended, no guarding  Extremities: trace LE edema, No lymphangitis, No petechiae, No rashes, no synovitis  Data Reviewed: Basic Metabolic Panel:  Recent Labs Lab 02/17/14 0755 02/17/14 0917 02/17/14 1142 02/17/14 1245 02/17/14 1527  NA 122* 123* 124* 123* 123*  K 3.8 3.7 3.7 3.4* 3.2*  CL 87* 88* 89* 87* 87*  CO2 23 22 21 22 23   GLUCOSE 170* 145* 222* 262* 216*    BUN 6 6 5* 5* 5*  CREATININE 0.24* 0.23* 0.23* 0.24* 0.21*  CALCIUM 8.3* 8.2* 8.1* 8.2* 8.3*   Liver Function Tests:  Recent Labs Lab 02/14/14 1946  AST 40*  ALT 49*  ALKPHOS 376*  BILITOT 1.1  PROT 6.7  ALBUMIN 2.9*   No results found for this basename: LIPASE, AMYLASE,  in the last 168 hours No results found for this basename: AMMONIA,  in the last 168 hours CBC:  Recent Labs Lab 02/14/14 1946 02/14/14 2208 02/16/14 0724 02/17/14 0755  WBC 6.8  --  7.1 6.1  NEUTROABS 5.7  --   --   --   HGB 9.6* 10.2* 8.3* 7.5*  HCT 25.7* 30.0* 23.1* 20.7*  MCV 74.3*  --  76.7* 78.4  PLT 109*  --  113* 108*   Cardiac Enzymes:  Recent Labs Lab 02/14/14 1946  TROPONINI <0.30   BNP: No components found with this basename: POCBNP,  CBG:  Recent Labs Lab 02/16/14 1120 02/16/14 1714 02/16/14 2126  02/17/14 0754 02/17/14 1139  GLUCAP 152* 168* 162* 156* 186*    Recent Results (from the past 240 hour(s))  CULTURE, BLOOD (ROUTINE X 2)     Status: None   Collection Time    02/14/14  7:46 PM      Result Value Ref Range Status   Specimen Description BLOOD PORTA CATH   Final   Special Requests BOTTLES DRAWN AEROBIC AND ANAEROBIC 6CC   Final   Culture NO GROWTH 3 DAYS   Final   Report Status PENDING   Incomplete  CULTURE, BLOOD (ROUTINE X 2)     Status: None   Collection Time    02/14/14  7:52 PM      Result Value Ref Range Status   Specimen Description BLOOD LEFT ANTECUBITAL   Final   Special Requests BOTTLES DRAWN AEROBIC AND ANAEROBIC 6CC   Final   Culture NO GROWTH 3 DAYS   Final   Report Status PENDING   Incomplete  URINE CULTURE     Status: None   Collection Time    02/14/14  8:16 PM      Result Value Ref Range Status   Specimen Description URINE, CATHETERIZED   Final   Special Requests NONE   Final   Culture  Setup Time     Final   Value: 02/15/2014 22:57     Performed at [facility]     Final   Value: 85,000 COLONIES/ML      Performed at [facility]   Culture     Final   Value: STAPHYLOCOCCUS SPECIES (COAGULASE NEGATIVE)     Note: RIFAMPIN AND GENTAMICIN SHOULD NOT BE USED AS SINGLE DRUGS FOR TREATMENT OF STAPH INFECTIONS.     Performed at [facility]   Report Status 02/17/2014 FINAL   Final   Organism ID, Bacteria STAPHYLOCOCCUS SPECIES (COAGULASE NEGATIVE)   Final     Scheduled Meds: . B-complex with vitamin C  1 tablet Oral Daily  . calcium-vitamin D  2 tablet Oral Q breakfast  . ceFEPime (MAXIPIME) IV  1 g Intravenous 3 times per day  . collagenase   Topical Daily  . feeding supplement (RESOURCE BREEZE)  1 Container Oral TID BM  . gabapentin  100 mg Oral BID  . hydrocortisone  20 mg Oral BID  . insulin aspart  0-9 Units Subcutaneous TID WC  . ipratropium-albuterol  3 mL Nebulization Q6H  . levothyroxine  100 mcg Oral QAC breakfast  . multivitamin with minerals  1 tablet Oral Daily  . potassium chloride  40 mEq Oral Once  . vancomycin  1,000 mg Intravenous Q24H   Continuous Infusions: . sodium chloride 75 mL/hr at 02/17/14 1600     Orson Eva, DO  [facility] Pager [phone]  If 7PM-7AM, please contact night-coverage [email or web address] Password [email or web address] 02/17/2014, 4:40 PM   LOS: 3 days

## 2014-02-18 ENCOUNTER — Ambulatory Visit: Payer: Medicaid Other | Admitting: Radiation Oncology

## 2014-02-18 DIAGNOSIS — D649 Anemia, unspecified: Secondary | ICD-10-CM

## 2014-02-18 LAB — BASIC METABOLIC PANEL
BUN: 7 mg/dL (ref 6–23)
BUN: 7 mg/dL (ref 6–23)
CHLORIDE: 93 meq/L — AB (ref 96–112)
CHLORIDE: 89 meq/L — AB (ref 96–112)
CO2: 23 meq/L (ref 19–32)
CO2: 24 mEq/L (ref 19–32)
CREATININE: 0.28 mg/dL — AB (ref 0.50–1.10)
CREATININE: 0.24 mg/dL — AB (ref 0.50–1.10)
Calcium: 8.1 mg/dL — ABNORMAL LOW (ref 8.4–10.5)
Calcium: 8.4 mg/dL (ref 8.4–10.5)
GFR calc Af Amer: >90 mL/min (ref >90)
GFR calc non Af Amer: >90 mL/min (ref >90)
Glucose, Bld: 124 mg/dL — ABNORMAL HIGH (ref 70–99)
Glucose, Bld: 231 mg/dL — ABNORMAL HIGH (ref 70–99)
POTASSIUM: 3 meq/L — AB (ref 3.7–5.3)
Potassium: 3.6 mEq/L — ABNORMAL LOW (ref 3.7–5.3)
Sodium: 129 mEq/L — ABNORMAL LOW (ref 137–147)
Sodium: 127 mEq/L — ABNORMAL LOW (ref 137–147)

## 2014-02-18 LAB — FOLATE

## 2014-02-18 LAB — RETICULOCYTES
RBC.: 3.78 MIL/uL — ABNORMAL LOW (ref 3.87–5.11)
RETIC COUNT ABSOLUTE: 109.6 10*3/uL (ref 19.0–186.0)
Retic Ct Pct: 2.9 % (ref 0.4–3.1)

## 2014-02-18 LAB — FERRITIN: Ferritin: 954 ng/mL — ABNORMAL HIGH (ref 10–291)

## 2014-02-18 LAB — TYPE AND SCREEN
ABO/RH(D): B POS
Antibody Screen: NEGATIVE
UNIT DIVISION: 0
Unit division: 0

## 2014-02-18 LAB — GLUCOSE, CAPILLARY
GLUCOSE-CAPILLARY: 148 mg/dL — AB (ref 70–99)
GLUCOSE-CAPILLARY: 157 mg/dL — AB (ref 70–99)
Glucose-Capillary: 124 mg/dL — ABNORMAL HIGH (ref 70–99)
Glucose-Capillary: 198 mg/dL — ABNORMAL HIGH (ref 70–99)

## 2014-02-18 LAB — CBC
HEMATOCRIT: 30.1 % — AB (ref 36.0–46.0)
Hemoglobin: 10.7 g/dL — ABNORMAL LOW (ref 12.0–15.0)
MCH: 28.3 pg (ref 26.0–34.0)
MCHC: 35.5 g/dL (ref 30.0–36.0)
MCV: 79.6 fL (ref 78.0–100.0)
Platelets: 73 10*3/uL — ABNORMAL LOW (ref 150–400)
RBC: 3.78 MIL/uL — AB (ref 3.87–5.11)
RDW: 16.1 % — ABNORMAL HIGH (ref 11.5–15.5)
WBC: 5.6 10*3/uL (ref 4.0–10.5)

## 2014-02-18 LAB — VITAMIN B12: VITAMIN B 12: 1590 pg/mL — AB (ref 211–911)

## 2014-02-18 LAB — MAGNESIUM: Magnesium: 2 mg/dL (ref 1.5–2.5)

## 2014-02-18 NOTE — Progress Notes (Signed)
PROGRESS NOTE  Debbie Larsen SJG:[id number] DOB: Nov 19, 1950 DOA: 02/14/2014 PCP: Melrose Nakayama, MD  Interim Summary 63 year old female with a history of invasive ductal carcinoma of the right breast diagnosed in 2002 status post lumpectomy and chemotherapy. She was evaluated for a suspicious mass behind the right breast in September 2013. Biopsy of right Lymph nodes showed high-grade invasive ductal carcinoma. A PET scan in November 2013 revealed recurrence of adenocarcinoma with bilateral mediastinal and left lung nodules. In March 2015, the patient was admitted to the ED with progressive lower extremity weakness. Subsequent workup revealed brain metastasis as well as metastasis to her lower thoracic and lumbar spine. The patient is presently paraplegic. The patient has been advised to consider hospice care, but wishes to continue aggressive therapy. She was made with acute encephalopathy secondary to hyponatremia with a sodium of 103. 3 management of her fluids and oral intake, the patient's sodium has gradually improved. The patient rescinded her DO NOT RESUSCITATE status and is presently full code. Oncology is currently following. Plans are noted for repeat immunotherapy on 02/19/2014. CT angiogram of the chest was negative for pulmonary embolus but revealed progressive metastatic disease, but infectious process cannot be ruled out. The patient was started on empiric antibiotics.  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 combination of poor solute intake, volume depletion and hypothyroidism (Nivolumab induced) -FeNa 0.35%  -Na increasing even after stopping NS  -Had to start D5W to avoid rapid sodium increase 4/18-4/19 -TSH--15.36    -Uric acid 2.1--further validates component of SIADH  -Urine Osm 145, sOsm 223  -BMP in am Metastatic poorly differentiated carcinoma  -Metastasis to lung, brain, spinal cord, liver  -Suspect lung versus breast primary  -Patient is in denial regarding prognosis  -Most recently received Nivolumab on 02/05/14  -Oncology is following-->plans for Nivolumab 02/19/14 -Patient continues to have unrealistic expectations  -After discussion with the patient, she has decided to continue [place]  -CT angiogram chest--negative for PE but shows increased bilateral upper lobe groundglass opacities with new ill-defined lower lobe nodular areas with considerations for infectious, inflammatory, and neoplastic etiologies-- also revealed increased size of liver lesions  -Empirically start the patient on IV antibiotics for treatment of possible infectious process  -EKG--sinus tachycardia, nonspecific ST-T wave change -Discontinue vancomycin -Continue cefepime 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  -Continue Cortef  Sacral decubitus ulcer  -Counsulted wound care  -Air mattress  Impaired glucose tolerance  -Secondary to chronic steroid use  -01/31/2014 hemoglobin A1c 6.5  -NovoLog sliding scale  Hypothyroidism  -Continue Synthroid  -TSH 15.36  -increase synthroid 100 mcg daily  Severe protein calorie malnutrition  -Nutritional supplementation  Anemia of Chronic Disease -transfused 2 units PRBC 02/17/14 Bacteriuria -No significant pyuria -Would not treat -Discontinue vancomycin Family Communication: Brother and sister updated  Disposition Plan: [place] Antibiotics  Vancomycin 02/16/14>>>02/18/14  Cefepime 02/16/14>>>      Procedures/Studies: Dg Chest 2 View  02/14/2014   CLINICAL DATA:  Shortness of breath, metastatic breast cancer.  EXAM: CHEST  2 VIEW  COMPARISON:  DG CHEST 1V PORT dated 01/29/2014; CT CHEST W/CM dated 01/14/2014; CT CHEST W/CM dated  07/03/2013  FINDINGS: Trachea is midline. Left subclavian power port tip is in  the SVC. Heart size stable. Pulmonary parenchymal lesions seen on 01/14/2014 are poorly appreciated. No pleural fluid. Surgical clips in the right axilla.  IMPRESSION: Pulmonary parenchymal lesions seen on 01/14/2014 are poorly appreciated on the current exam. No large areas of airspace consolidation. No pleural fluid.   Electronically Signed   By: Lorin Picket M.D.   On: 02/14/2014 20:29   Ct Angio Chest Pe W/cm &/or Wo Cm  02/15/2014   CLINICAL DATA:  Breast cancer. Status post lumpectomy, chemotherapy and radiation. Metastases to lung, bone and brain. Tachycardia, shortness of breath.  EXAM: CT ANGIOGRAPHY CHEST WITH CONTRAST  TECHNIQUE: Multidetector CT imaging of the chest was performed using the standard protocol during bolus administration of intravenous contrast. Multiplanar CT image reconstructions and MIPs were obtained to evaluate the vascular anatomy.  CONTRAST:  119mL OMNIPAQUE IOHEXOL 350 MG/ML SOLN  COMPARISON:  01/14/2014  FINDINGS: Left-sided Port-A-Cath is in place with the tip in the SVC. Heart is borderline in size. There is a small pericardial effusion. Aorta is normal caliber.  6 mm prevascular lymph node on image 25. Abnormal soft tissue in the right paratracheal region is difficult to measure as this appears to be rather diffuse and ill-defined. This has increased since prior study.  Right lower lobe pulmonary nodule on image 49 measures 7 mm, not significantly changed. Nodular area in the left upper lobe in the perihilar region on image 36 measures 10 mm compared with 9 mm previously, not significantly changed. Areas of peripheral ground-glass airspace opacity noted peripherally in the upper lobes bilaterally, increased since prior study. There are numerous small somewhat ill-defined lower lobe pulmonary nodular areas, many of which are new since prior study. These are small, 3-4 mm or less in size. Marland Kitchen   Postsurgical changes in the right breast and axilla.  Diffuse sclerotic lesions throughout the thoracic spine compatible with metastases. Multiple bilateral rib lesions.  Multiple rim enhancing lesions within the liver. 2.4 cm lesion within the right hepatic lobe compared with 1.8 cm previously. 1.8 cm lesion in the left hepatic lobe on image 69 compared with 8 mm previously. The other lesions in the dome with the liver are not as well visualized on this Chest CT due to bolus timing.  Review of the MIP images confirms the above findings.  IMPRESSION: New patchy ground-glass airspace opacities within the upper lobes bilaterally peripherally. There are also numerous small ill-defined subcentimeric pulmonary nodules in the lower lobes. Previously seen nodular densities in the lungs are relatively stable. Overall process in the lungs could reflect lymphangitic spread of tumor, atypical infections or drug toxicity. Pulmonary nodularity scratch head the pulmonary nodules are concerning for metastases.  Small pericardial effusion.  Ill-defined abnormal soft tissue in the mediastinum, predominantly in the paratracheal region, difficult to measure due to its indistinct appearance.  Slight enlargement of at least 2 hepatic metastases.  Stable diffuse bony metastases.   Electronically Signed   By: Rolm Baptise M.D.   On: 02/15/2014 20:09   Mr Jeri Cos PO Contrast  01/20/2014   CLINICAL DATA:  Breast cancer.  Evaluate for metastatic disease.  EXAM: MRI HEAD WITHOUT AND WITH CONTRAST  TECHNIQUE: Multiplanar, multiecho pulse sequences of the brain and surrounding structures were obtained without and with intravenous contrast.  CONTRAST:  MultiHance 9 mL.  COMPARISON:  MR HEAD WO/W CM dated 12/18/2013; MR C SPINE WO/W CM dated 02/21/2013  FINDINGS: No acute stroke or hemorrhage. No hydrocephalus or extra-axial fluid.  Intracranial metastatic disease is  re-demonstrated, as was observed on the previous MR. Subcentimeter lesions are  seen in the right cerebellar hemisphere, inferior vermis, right temporal lobe, left temporal lobe, and multiple lesions throughout the subcortical white matter of left greater than right cerebral hemispheres. The dominant lesion is an osseous metastasis to the greater wing of the sphenoid on the right, measures 17 x 19 mm, unchanged in size from priors. A dural based lesion is also seen over the right frontal convexity, similar to priors. No midline shift. No impending herniation. Moderate vasogenic edema is associated with the right temporal lesion, increased from priors, and could predispose the patient to seizures. Other lesions also are roughly similar, including a spherical 7 mm lesion deep to the insula. There is slight increased edema surrounding the right cerebellar lesion compared with priors, but there is slightly less edema surrounding the left parietal subcortical lesion. A few more lesions are visible than were previously noted on the 12/18/2013 study, but there was considerable motion on that exam.  Marrow heterogeneity in the clivus could suggest additional tumor involvement. Low T1 signal intensity bone marrow in the upper cervical region unchanged from priors.  IMPRESSION: Suspected slight progression of metastatic disease, with a few more lesions visible than were observed on 12/18/2013, as well as increased vasogenic edema surrounding the right middle cranial fossa osseous lesion, and right cerebellar lesion. No impending herniation or midline shift.  No lesions involving the parasagittal posterior frontal cortex which might cause bilateral leg weakness.   Electronically Signed   By: Rolla Flatten M.D.   On: 01/20/2014 20:09   Mr Thoracic Spine W Contrast  01/21/2014   CLINICAL DATA:  63 year old female with stage IV breast cancer. New onset lower extremity weakness and unable to move the lower extremities. Initial encounter.  EXAM: MRI THORACIC SPINE WITH CONTRAST  TECHNIQUE: Multiplanar and  multiecho pulse sequences of the thoracic spine were obtained with intravenous contrast.  CONTRAST:  88mL MULTIHANCE GADOBENATE DIMEGLUMINE 529 MG/ML IV SOLN  COMPARISON:  Lumbar MRI 01/20/2014.  FINDINGS: Diffusely abnormal bone marrow signal. Subsequently, cervical vertebral delineation is difficult on the sagittal scout view. The numbering system here agrees with that on the recent lumbar exam.  Diffuse thoracic spine and widespread posterior rib metastases. Still, no thoracic epidural or extraosseous tumor identified.  Intra-axial partially exophytic solidly enhancing mass of the thoracic spinal cord centered at the T8 level measures 7 x 9 x 17 mm (AP by transverse by CC) and is associated with widespread thoracic spinal cord edema. Only a portion of the conus medullaris, and the ventral lower cervical cord is spared. Mild cord expansion suspected below the level of the tumor.  There is also a much smaller (2 mm) enhancing cord metastasis at the T2-T3 level just to the right of midline (series 10, image 8).  No definite cauda equina tumor. No abnormal dural thickening or enhancement.  Otherwise, bilateral lung nodules are suggested, and central right liver mass may have increased (series 7, image 39). See Chest abdomen pelvis CT 01/14/2014.  IMPRESSION: 1. 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 Associated spinal cord edema is widespread. 2. Diffuse osseous metastatic disease. Study discussed by telephone with Dr. [name] on 01/21/2014 at 15:29 .   Electronically Signed   By: Lars Pinks M.D.   On: 01/21/2014 15:31   Mr Lumbar Spine W Wo Contrast  01/20/2014   CLINICAL DATA:  New onset of lower extremity weakness. Breast cancer.  EXAM: MRI LUMBAR SPINE WITHOUT AND WITH CONTRAST  TECHNIQUE: Multiplanar and multiecho pulse sequences of the lumbar spine were obtained without and with intravenous contrast.  CONTRAST:  90mL MULTIHANCE GADOBENATE DIMEGLUMINE 529 MG/ML IV  SOLN  COMPARISON:  CT ABD/PELVIS W CM dated 01/14/2014; MR HEAD WO/W CM dated 01/20/2014; NM PET IMAGE RESTAG (PS) SKULL BASE TO THIGH dated 10/08/2013  FINDINGS: The scan extends from midportion T11 through the sacrum.  Widespread marrow heterogeneity reflecting osseous metastatic disease and post treatment effect. No areas of pathologic compression deformity are evident. There is no visible epidural tumor. The alignment is anatomic.  No pelvic masses.  Bladder decompressed with Foley catheter.  No disc protrusion or spinal stenosis. Post infusion imaging demonstrates no abnormal intraspinal enhancement of the nerve roots or distal conus.  On sagittal T2 and STIR imaging, the conus appears abnormal, displaying intramedullary T2 bright signal and slight cord enlargement. An intramedullary metastasis above the field-of-view, at T10 or above, is suspected, with associated distal cord edema. There is no definite abnormal enhancement of the visualized thoracic cord or conus on post infusion imaging.  IMPRESSION: Suspected intramedullary metastasis above the field-of-view of lumbar spine MRI. The distal thoracic cord and conus appear to display T2 bright signal concerning for edema. Thoracic spine MRI without and with contrast recommended for further evaluation.  No evidence for spinal stenosis, dropped metastases to the cauda equina, or intraspinal mass lesion in the lumbar or sacral segments.  Widespread osseous disease without pathologic compression fracture or malalignment.   Electronically Signed   By: Rolla Flatten M.D.   On: 01/20/2014 19:19   Dg Chest Port 1 View  01/29/2014   CLINICAL DATA:  FEVER FEVER  EXAM: PORTABLE CHEST - 1 VIEW  COMPARISON:  MR T SPINE W/CM dated 01/21/2014; CT CHEST W/CM dated 01/14/2014; DG CHEST 1V PORT dated 09/19/2012  FINDINGS: The cardiac silhouette is enlarged. There are no focal infiltrates, effusions, nor edema. Stable prominence of the interstitial markings is appreciated. A left  sided porta catheter is demonstrated via a subclavian approach with tip projecting in the region of the superior vena cava . No acute osseous abnormalities.  IMPRESSION: No evidence of acute cardiopulmonary disease. Chronic interstitial changes.   Electronically Signed   By: Margaree Mackintosh M.D.   On: 01/29/2014 19:50         Subjective: Patient denies fevers, chills, headache, chest pain, dyspnea, nausea, vomiting, diarrhea, abdominal pain, dysuria, hematuria   Objective: Filed Vitals:   02/18/14 0855 02/18/14 1000 02/18/14 1130 02/18/14 1515  BP:  126/69    Pulse: 95     Temp:   98.1 F (36.7 C)   TempSrc:   Oral   Resp: 22 24    Height:      Weight:      SpO2: 99%   99%    Intake/Output Summary (Last 24 hours) at 02/18/14 1555 Last data filed at 02/18/14 1200  Gross per 24 hour  Intake 1832.08 ml  Output   4550 ml  Net -2717.92 ml   Weight change: 0.4 kg (14.1 oz) Exam:   General:  Pt is alert, follows commands appropriately, not in acute distress  HEENT: No icterus, No thrush,  [place]/AT  Cardiovascular: RRR, S1/S2, no rubs, no gallops  Respiratory: Scattered bibasilar rales. No wheezing.  Abdomen: Soft/+BS, non tender, non distended, no guarding  Extremities: trace LE edema, No lymphangitis, No petechiae, No rashes, no synovitis  Data Reviewed: Basic Metabolic Panel:  Recent  Labs Lab 02/17/14 1142 02/17/14 1245 02/17/14 1527 02/17/14 1704 02/18/14 0736  NA 124* 123* 123* 124* 129*  K 3.7 3.4* 3.2* 3.2* 3.6*  CL 89* 87* 87* 87* 93*  CO2 21 22 23 24 23   GLUCOSE 222* 262* 216* 175* 124*  BUN 5* 5* 5* 4* 7  CREATININE 0.23* 0.24* 0.21* 0.24* 0.28*  CALCIUM 8.1* 8.2* 8.3* 8.3* 8.1*  MG  --   --   --   --  2.0   Liver Function Tests:  Recent Labs Lab 02/14/14 1946  AST 40*  ALT 49*  ALKPHOS 376*  BILITOT 1.1  PROT 6.7  ALBUMIN 2.9*   No results found for this basename: LIPASE, AMYLASE,  in the last 168 hours No results found for this  basename: AMMONIA,  in the last 168 hours CBC:  Recent Labs Lab 02/14/14 1946 02/14/14 2208 02/16/14 0724 02/17/14 0755 02/18/14 0736  WBC 6.8  --  7.1 6.1 5.6  NEUTROABS 5.7  --   --   --   --   HGB 9.6* 10.2* 8.3* 7.5* 10.7*  HCT 25.7* 30.0* 23.1* 20.7* 30.1*  MCV 74.3*  --  76.7* 78.4 79.6  PLT 109*  --  113* 108* 73*   Cardiac Enzymes:  Recent Labs Lab 02/14/14 1946  TROPONINI <0.30   BNP: No components found with this basename: POCBNP,  CBG:  Recent Labs Lab 02/17/14 1139 02/17/14 1645 02/17/14 2053 02/18/14 0729 02/18/14 1119  GLUCAP 186* 163* 138* 124* 148*    Recent Results (from the past 240 hour(s))  CULTURE, BLOOD (ROUTINE X 2)     Status: None   Collection Time    02/14/14  7:46 PM      Result Value Ref Range Status   Specimen Description BLOOD PORTA CATH DRAWN BY RN   Final   Special Requests BOTTLES DRAWN AEROBIC AND ANAEROBIC 6CC   Final   Culture NO GROWTH 4 DAYS   Final   Report Status PENDING   Incomplete  CULTURE, BLOOD (ROUTINE X 2)     Status: None   Collection Time    02/14/14  7:52 PM      Result Value Ref Range Status   Specimen Description BLOOD LEFT ANTECUBITAL   Final   Special Requests BOTTLES DRAWN AEROBIC AND ANAEROBIC 6CC   Final   Culture NO GROWTH 4 DAYS   Final   Report Status PENDING   Incomplete  URINE CULTURE     Status: None   Collection Time    02/14/14  8:16 PM      Result Value Ref Range Status   Specimen Description URINE, CATHETERIZED   Final   Special Requests NONE   Final   Culture  Setup Time     Final   Value: 02/15/2014 22:57     Performed at [facility]     Final   Value: 85,000 COLONIES/ML     Performed at [facility]   Culture     Final   Value: STAPHYLOCOCCUS SPECIES (COAGULASE NEGATIVE)     Note: RIFAMPIN AND GENTAMICIN SHOULD NOT BE USED AS SINGLE DRUGS FOR TREATMENT OF STAPH INFECTIONS.     Performed at [facility]   Report Status 02/17/2014 FINAL    Final   Organism ID, Bacteria STAPHYLOCOCCUS SPECIES (COAGULASE NEGATIVE)   Final     Scheduled Meds: . sodium chloride  250 mL Intravenous Once  . acetaminophen  650  mg Oral Once  . B-complex with vitamin C  1 tablet Oral Daily  . calcium-vitamin D  2 tablet Oral Q breakfast  . ceFEPime (MAXIPIME) IV  1 g Intravenous 3 times per day  . collagenase   Topical Daily  . diphenhydrAMINE  25 mg Oral Once  . feeding supplement (RESOURCE BREEZE)  1 Container Oral TID BM  . gabapentin  100 mg Oral BID  . hydrocortisone  20 mg Oral BID  . insulin aspart  0-9 Units Subcutaneous TID WC  . ipratropium-albuterol  3 mL Nebulization Q6H  . levothyroxine  100 mcg Oral QAC breakfast  . multivitamin with minerals  1 tablet Oral Daily  . vancomycin  1,000 mg Intravenous Q24H   Continuous Infusions: . sodium chloride 20 mL/hr at 02/18/14 1200     Orson Eva, DO  [facility] Pager [phone]  If 7PM-7AM, please contact night-coverage [email or web address] Password [facility] 02/18/2014, 3:55 PM   LOS: 4 days

## 2014-02-18 NOTE — Progress Notes (Signed)
Supervisor brought the patient a vegetarian meal including a salad and fruit cup.  Patient stated when I gave her the tray that she had been asking me since 5:30 for something to eat and that she wanted to know why it took so long.  I explained to the patient that I clocked in at 7 PM and that she must have talked to someone else and that as soon as she asked me for something to eat I began working on it.  Patient stated that she was sure it was me she talked to at  5:30.  Again I explained to the patient that I was not here at 5:30.  Patient then stated well what are you going to do about it.  I told the patient that the nurse she had spoken to had already left for the day and that I would let the supervisor know.  Will continue to monitor the situation.

## 2014-02-18 NOTE — Progress Notes (Signed)
Patient c/o not having a dinner tray.  I apologized to the patient stating that we had TV dinners here on the floor and that I would be happy to heat one up for her.  Patient stated that she is a vegetarian and that she could not eat the TV dinners we have to offer.  At that time I told the patient that I would talk to the supervisor and see if we could get her something from the cafeteria, but that it may take a little while because the cafeteria is closed.  Patient then stated that her family had brought her something and that it was in the refrigerator in the ICU.  Looked in the ICU and there was no food for the patient.  Told the patient there was nothing in the ICU refrigerator that the supervisor was working on getting her something to eat.   Will continue to monitor.

## 2014-02-18 NOTE — Clinical Documentation Improvement (Signed)
  Per MRI 3/23:"Moderate vasogenic edema is associated with the right temporal lesion, increased from priors, and could predispose the patient to seizures". Patient with AMS. If appropriate please document in Notes and DC summary. Thank you.  Possible Clinical Conditions? - vasogenic edema - cerebral edema  - Other Condition   Thank You, Ezekiel Ina ,RN Clinical Documentation Specialist:  [phone]  [facility] [employer]

## 2014-02-18 NOTE — Progress Notes (Signed)
Subjective: "Why are all of the doctors scaring me"  I spent time this morning, as I have in the past, explaining how bad her malignancy is.  Despite this, she wishes to remain a full code knowing that she has an incurable disease.  Objective: Vital signs in last 24 hours: Temp:  [97.8 F (36.6 C)-99.7 F (37.6 C)] 97.8 F (36.6 C) (04/21 0400) Resp:  [16-29] 16 (04/21 0400) BP: (120-150)/(68-83) 134/69 mmHg (04/21 0400) SpO2:  [100 %] 100 % (04/20 2004) Weight:  [101 lb 3.1 oz (45.9 kg)] 101 lb 3.1 oz (45.9 kg) (04/21 0500)  Intake/Output from previous day: 04/20 0800 - 04/21 0759 In: 4309.6 [P.O.:690; I.V.:2272.5; Blood:647.1; IV Piggyback:700] Out: 4000 [Urine:4000] Intake/Output this shift: Total I/O In: 4309.6 [P.O.:690; I.V.:2272.5; Blood:647.1; IV Piggyback:700] Out: 4000 [Urine:4000]  General appearance: alert, cooperative, appears stated age, no distress and smiling  Lab Results:   Recent Labs  02/16/14 0724 02/17/14 0755  WBC 7.1 6.1  HGB 8.3* 7.5*  HCT 23.1* 20.7*  PLT 113* 108*   BMET  Recent Labs  02/17/14 1527 02/17/14 1704  NA 123* 124*  K 3.2* 3.2*  CL 87* 87*  CO2 23 24  GLUCOSE 216* 175*  BUN 5* 4*  CREATININE 0.21* 0.24*  CALCIUM 8.3* 8.3*    Studies/Results: No results found.  Medications: I have reviewed the patient's current medications.  Assessment/Plan: 1. Hyponatremia, likely iatrogenic hypothyroidism induced with a low urine Na and low osmolality consistent with the effects of hypothyroidism. Cannot call this SIADH in the setting of frank hypothyroidism. On levothyroxine. Addison's disease is less likely given K+ is WNL. Fluid restrictions removed on 02/17/2014. 2. Anemia, normocytic, hyperchromic. S/P 2 units of PRBCs on 4/20.  Anemia panel ordered to rule vitamin deficiencies. 3. Thrombocytopenia, stable.  4. Metastatic poorly differentiated carcinoma, suspect lung primary (versus breast) with bone, mediastinal, lymphangitic  spread, intramedullary thoracic spinal cord and brain metastases. Day 14 of cycle 1 of Nivolumab therapy (given on 02/05/2014). Due for next infusion on 02/19/2014.  5. Realistic expectations, but not making appropriate goal of care decisions, based upon her poor prognosis. She wishes to continue treatment as planned.   6. FULL CODE 7. If stable and appropriate, recommend transfer to med/surg floor today.  If able to be transferred, will proceed with Nivolumab therapy tomorrow.   Patient and plan discussed with Dr. Farrel Gobble and he is in agreement with the aforementioned.      LOS: 4 days    [facility] 02/18/2014

## 2014-02-18 NOTE — Progress Notes (Signed)
PT TRANSFERRED TO ROOM 337. PORT A CATH PATENT. O2 AT 2L/MIN IA [place].  VSS. NO ACUTE DISTRESS.FOLEY CATH PATENT. REPORT CALLED TO CHRISTY RN ON 300.

## 2014-02-18 NOTE — Clinical Social Work Psychosocial (Signed)
Clinical Social Work Department BRIEF PSYCHOSOCIAL ASSESSMENT 02/18/2014  [name], Debbie Larsen     Account Number:  [id number]     Admit date:  02/14/2014  Clinical Social Worker:  Edwyna Shell, [place]  Date/Time:  02/17/2014 05:00 PM  Referred by:  CSW  Date Referred:  02/17/2014 Referred for  SNF Placement   Other Referral:   Interview type:  Patient Other interview type:    PSYCHOSOCIAL DATA Living Status:  FACILITY Admitted from facility:  [facility] Level of care:  [place] Primary support name:  Sharmon Leyden Primary support relationship to patient:  NEIGHBOR Degree of support available:   Limited support, family out of town.    CURRENT CONCERNS Current Concerns  Post-Acute Placement   Other Concerns:    SOCIAL WORK ASSESSMENT / PLAN CSW met w patient in room, patient alert and oriented x4. Patient is well known to CSW, has been at [facility] in prior admission.  Cancer patient placed at [facility] from [place], limited family support as siblings live out of town, has been divorced approx 20 years.    Receives divorced spouse benefits from [place] in amount of $756/month, $7 over limit for outpatient Medicaid according to patient.  She is quite frustrated and believes that full Medicaid benefits might afford her greater options in her cancer care.  However, she is very appreciative of the care she has received at [facility] and feels she is getting stronger.  She states that [facility] SNF has been a good place for her, but wants to get her PT rehab at [facility] because she likes the therapists better.    CSW will continue to work w patient and try to address her psychosocial needs as possible.  Plan is to return to [facility] at discharge and continue treatment at [facility].  Brother and wife were visiting patient from [place] today.   Assessment/plan status:  Psychosocial Support/Ongoing Assessment of Needs Other  assessment/ plan:   Information/referral to community resources:   Return to [place]: Patient requested lemon drops for her throat, patient advocate informed.  Also complained of cold, RN advised.        Edwyna Shell, LCSW Clinical Social Worker [phone])

## 2014-02-19 ENCOUNTER — Ambulatory Visit (HOSPITAL_COMMUNITY): Payer: Self-pay

## 2014-02-19 ENCOUNTER — Encounter (HOSPITAL_COMMUNITY): Payer: Self-pay | Admitting: Hematology and Oncology

## 2014-02-19 ENCOUNTER — Inpatient Hospital Stay (HOSPITAL_COMMUNITY): Payer: Self-pay

## 2014-02-19 LAB — CBC WITH DIFFERENTIAL/PLATELET
BASOS PCT: 0 % (ref 0–1)
Basophils Absolute: 0 10*3/uL (ref 0.0–0.1)
EOS PCT: 1 % (ref 0–5)
Eosinophils Absolute: 0 10*3/uL (ref 0.0–0.7)
HEMATOCRIT: 30.3 % — AB (ref 36.0–46.0)
Hemoglobin: 10.6 g/dL — ABNORMAL LOW (ref 12.0–15.0)
Lymphocytes Relative: 15 % (ref 12–46)
Lymphs Abs: 1 10*3/uL (ref 0.7–4.0)
MCH: 27.9 pg (ref 26.0–34.0)
MCHC: 35 g/dL (ref 30.0–36.0)
MCV: 79.7 fL (ref 78.0–100.0)
MONO ABS: 0.6 10*3/uL (ref 0.1–1.0)
Monocytes Relative: 9 % (ref 3–12)
Neutro Abs: 4.7 10*3/uL (ref 1.7–7.7)
Neutrophils Relative %: 75 % (ref 43–77)
Platelets: 67 10*3/uL — ABNORMAL LOW (ref 150–400)
RBC: 3.8 MIL/uL — ABNORMAL LOW (ref 3.87–5.11)
RDW: 16.3 % — AB (ref 11.5–15.5)
WBC: 6.2 10*3/uL (ref 4.0–10.5)

## 2014-02-19 LAB — COMPREHENSIVE METABOLIC PANEL
ALT: 43 U/L — ABNORMAL HIGH (ref 0–35)
AST: 40 U/L — AB (ref 0–37)
Albumin: 2.5 g/dL — ABNORMAL LOW (ref 3.5–5.2)
Alkaline Phosphatase: 343 U/L — ABNORMAL HIGH (ref 39–117)
BUN: 7 mg/dL (ref 6–23)
CALCIUM: 8.5 mg/dL (ref 8.4–10.5)
CO2: 25 mEq/L (ref 19–32)
CREATININE: 0.23 mg/dL — AB (ref 0.50–1.10)
Chloride: 91 mEq/L — ABNORMAL LOW (ref 96–112)
GFR calc Af Amer: >90 mL/min (ref >90)
GFR calc non Af Amer: >90 mL/min (ref >90)
Glucose, Bld: 156 mg/dL — ABNORMAL HIGH (ref 70–99)
Potassium: 3.1 mEq/L — ABNORMAL LOW (ref 3.7–5.3)
Sodium: 129 mEq/L — ABNORMAL LOW (ref 137–147)
TOTAL PROTEIN: 5.9 g/dL — AB (ref 6.0–8.3)
Total Bilirubin: 0.5 mg/dL (ref 0.3–1.2)

## 2014-02-19 LAB — GLUCOSE, CAPILLARY
GLUCOSE-CAPILLARY: 114 mg/dL — AB (ref 70–99)
GLUCOSE-CAPILLARY: 95 mg/dL (ref 70–99)
GLUCOSE-CAPILLARY: 113 mg/dL — AB (ref 70–99)

## 2014-02-19 LAB — CULTURE, BLOOD (ROUTINE X 2)
Culture: NO GROWTH
Culture: NO GROWTH

## 2014-02-19 LAB — IRON AND TIBC
Iron: 150 ug/dL — ABNORMAL HIGH (ref 42–135)
Saturation Ratios: 55 % (ref 20–55)
TIBC: 274 ug/dL (ref 250–470)
UIBC: 124 ug/dL — ABNORMAL LOW (ref 125–400)

## 2014-02-19 MED ORDER — SODIUM CHLORIDE 0.9 % IV SOLN
3.0000 mg/kg | Freq: Once | INTRAVENOUS | Status: AC
  Administered 2014-02-19: 140 mg via INTRAVENOUS
  Filled 2014-02-19: qty 4

## 2014-02-19 MED ORDER — DEXTROSE 5 % IV SOLN
1.0000 g | Freq: Three times a day (TID) | INTRAVENOUS | Status: DC
  Filled 2014-02-19 (×3): qty 1

## 2014-02-19 NOTE — Progress Notes (Signed)
Tolerating infusion well.

## 2014-02-19 NOTE — Progress Notes (Signed)
PROGRESS NOTE  Debbie Larsen:[id number] DOB: 1951-06-10 DOA: 02/14/2014 PCP: Melrose Nakayama, MD  Interim Summary 63 year old female with a history of invasive ductal carcinoma of the right breast diagnosed in 2002 status post lumpectomy and chemotherapy. She was evaluated for a suspicious mass behind the right breast in September 2013. Biopsy of right Lymph nodes showed high-grade invasive ductal carcinoma. A PET scan in November 2013 revealed recurrence of adenocarcinoma with bilateral mediastinal and left lung nodules. In March 2015, the patient was admitted to the ED with progressive lower extremity weakness. Subsequent workup revealed brain metastasis as well as metastasis to her lower thoracic and lumbar spine. The patient is presently paraplegic. The patient has been advised to consider hospice care, but wishes to continue aggressive therapy. She was made with acute encephalopathy secondary to hyponatremia with a sodium of 103. 3 management of her fluids and oral intake, the patient's sodium has gradually improved. The patient rescinded her DO NOT RESUSCITATE status and is presently full code. Oncology is currently following. Plans are noted for repeat immunotherapy on 02/19/2014. CT angiogram of the chest was negative for pulmonary embolus but revealed progressive metastatic disease, but infectious process cannot be ruled out. The patient was started on empiric antibiotics.  She remains on Ns @75cc /hr  Assessment/Plan: Acute metabolic encephalopathy -Secondary to hyponatremia  -Resolved with correction of hyponatremia  -Serum appears to have stabilized in the upper 120s   Hyponatremia  -gradually improving (initially 103) -Multifactorial due to?? SIADH with a combination of poor solute intake, volume depletion and hypothyroidism (Nivolumab induced) -FeNa 0.35%  -Na increasing even after stopping NS  -Had to start D5W to avoid rapid sodium increase  4/18-4/19 -TSH--15.36  -Urine Osm 145, sOsm 223  -BMP in am  Metastatic poorly differentiated carcinoma  -Metastasis to lung, brain, spinal cord, liver  -Suspect lung versus breast primary  -Patient is in denial regarding prognosis  -Most recently received Nivolumab on 02/05/14  -Oncology is following-->plans for Nivolumab 02/19/14. Infusion is every 2 weeks. -Patient continues to have unrealistic expectations  -After discussion with the patient, she has decided to continue [place] oncology input and recommendations.  Tachycardia/dyspnea  -CT angiogram chest--negative for PE but shows increased bilateral upper lobe groundglass opacities with new ill-defined lower lobe nodular areas with considerations for infectious, inflammatory, and neoplastic etiologies-- also revealed increased size of liver lesions  -Empirically start the patient on IV antibiotics for treatment of possible infectious process  -EKG--sinus tachycardia, nonspecific ST-T wave change -Discontinue vancomycin -Continue cefepime D#3 -4/22: no clinical signs of infection, will DC all antibiotics at this time.  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  Antibiotics  Vancomycin 02/16/14>>>02/18/14  Cefepime 02/16/14>>>02/19/14    Procedures/Studies: Dg Chest 2 View  02/14/2014   CLINICAL  DATA:  Shortness of breath, metastatic breast cancer.  EXAM: CHEST  2 VIEW  COMPARISON:  DG CHEST 1V PORT dated 01/29/2014; CT CHEST W/CM dated 01/14/2014; CT CHEST W/CM dated 07/03/2013  FINDINGS: Trachea is midline. Left subclavian power port tip is in the SVC. Heart size stable. Pulmonary parenchymal lesions seen on 01/14/2014 are poorly appreciated. No pleural fluid. Surgical clips in the right axilla.  IMPRESSION: Pulmonary parenchymal lesions seen on 01/14/2014 are poorly appreciated on the current exam. No large areas of airspace consolidation. No pleural fluid.   Electronically Signed   By: Lorin Picket M.D.   On: 02/14/2014 20:29   Ct Angio Chest Pe W/cm &/or Wo Cm  02/15/2014   CLINICAL DATA:  Breast cancer. Status post lumpectomy, chemotherapy and radiation. Metastases to lung, bone and brain. Tachycardia, shortness of breath.  EXAM: CT ANGIOGRAPHY CHEST WITH CONTRAST  TECHNIQUE: Multidetector CT imaging of the chest was performed using the standard protocol during bolus administration of intravenous contrast. Multiplanar CT image reconstructions and MIPs were obtained to evaluate the vascular anatomy.  CONTRAST:  136mL OMNIPAQUE IOHEXOL 350 MG/ML SOLN  COMPARISON:  01/14/2014  FINDINGS: Left-sided Port-A-Cath is in place with the tip in the SVC. Heart is borderline in size. There is a small pericardial effusion. Aorta is normal caliber.  6 mm prevascular lymph node on image 25. Abnormal soft tissue in the right paratracheal region is difficult to measure as this appears to be rather diffuse and ill-defined. This has increased since prior study.  Right lower lobe pulmonary nodule on image 49 measures 7 mm, not significantly changed. Nodular area in the left upper lobe in the perihilar region on image 36 measures 10 mm compared with 9 mm previously, not significantly changed. Areas of peripheral ground-glass airspace opacity noted peripherally in the upper lobes bilaterally, increased since prior study.  There are numerous small somewhat ill-defined lower lobe pulmonary nodular areas, many of which are new since prior study. These are small, 3-4 mm or less in size. Marland Kitchen  Postsurgical changes in the right breast and axilla.  Diffuse sclerotic lesions throughout the thoracic spine compatible with metastases. Multiple bilateral rib lesions.  Multiple rim enhancing lesions within the liver. 2.4 cm lesion within the right hepatic lobe compared with 1.8 cm previously. 1.8 cm lesion in the left hepatic lobe on image 69 compared with 8 mm previously. The other lesions in the dome with the liver are not as well visualized on this Chest CT due to bolus timing.  Review of the MIP images confirms the above findings.  IMPRESSION: New patchy ground-glass airspace opacities within the upper lobes bilaterally peripherally. There are also numerous small ill-defined subcentimeric pulmonary nodules in the lower lobes. Previously seen nodular densities in the lungs are relatively stable. Overall process in the lungs could reflect lymphangitic spread of tumor, atypical infections or drug toxicity. Pulmonary nodularity scratch head the pulmonary nodules are concerning for metastases.  Small pericardial effusion.  Ill-defined abnormal soft tissue in the mediastinum, predominantly in the paratracheal region, difficult to measure due to its indistinct appearance.  Slight enlargement of at least 2 hepatic metastases.  Stable diffuse bony metastases.   Electronically Signed   By: Rolm Baptise M.D.   On: 02/15/2014 20:09   Mr Jeri Cos ID Contrast  01/20/2014   CLINICAL DATA:  Breast cancer.  Evaluate for metastatic disease.  EXAM: MRI HEAD WITHOUT AND WITH CONTRAST  TECHNIQUE: Multiplanar, multiecho pulse sequences of the brain and surrounding structures were obtained without and with intravenous contrast.  CONTRAST:  MultiHance 9 mL.  COMPARISON:  MR HEAD WO/W CM dated 12/18/2013; MR C SPINE WO/W CM dated 02/21/2013  FINDINGS: No acute stroke or  hemorrhage. No hydrocephalus or extra-axial fluid.  Intracranial metastatic disease is re-demonstrated, as was observed on the previous MR. Subcentimeter lesions are seen in the right cerebellar hemisphere, inferior vermis, right temporal lobe, left temporal lobe, and multiple lesions throughout the subcortical white matter of left greater than right cerebral hemispheres. The dominant lesion is an osseous metastasis to the greater wing of the sphenoid on the right, measures 17 x 19 mm, unchanged in size from priors. A dural based lesion is also seen over the right frontal convexity, similar to priors. No midline shift. No impending herniation. Moderate vasogenic edema is associated with the right temporal lesion, increased from priors, and could predispose the patient to seizures. Other lesions also are roughly similar, including a spherical 7 mm lesion deep to the insula. There is slight increased edema surrounding the right cerebellar lesion compared with priors, but there is slightly less edema surrounding the left parietal subcortical lesion. A few more lesions are visible than were previously noted on the 12/18/2013 study, but there was considerable motion on that exam.  Marrow heterogeneity in the clivus could suggest additional tumor involvement. Low T1 signal intensity bone marrow in the upper cervical region unchanged from priors.  IMPRESSION: Suspected slight progression of metastatic disease, with a few more lesions visible than were observed on 12/18/2013, as well as increased vasogenic edema surrounding the right middle cranial fossa osseous lesion, and right cerebellar lesion. No impending herniation or midline shift.  No lesions involving the parasagittal posterior frontal cortex which might cause bilateral leg weakness.   Electronically Signed   By: Rolla Flatten M.D.   On: 01/20/2014 20:09   Mr Thoracic Spine W Contrast  01/21/2014   CLINICAL DATA:  63 year old female with stage IV breast cancer.  New onset lower extremity weakness and unable to move the lower extremities. Initial encounter.  EXAM: MRI THORACIC SPINE WITH CONTRAST  TECHNIQUE: Multiplanar and multiecho pulse sequences of the thoracic spine were obtained with intravenous contrast.  CONTRAST:  79mL MULTIHANCE GADOBENATE DIMEGLUMINE 529 MG/ML IV SOLN  COMPARISON:  Lumbar MRI 01/20/2014.  FINDINGS: Diffusely abnormal bone marrow signal. Subsequently, cervical vertebral delineation is difficult on the sagittal scout view. The numbering system here agrees with that on the recent lumbar exam.  Diffuse thoracic spine and widespread posterior rib metastases. Still, no thoracic epidural or extraosseous tumor identified.  Intra-axial partially exophytic solidly enhancing mass of the thoracic spinal cord centered at the T8 level measures 7 x 9 x 17 mm (AP by transverse by CC) and is associated with widespread thoracic spinal cord edema. Only a portion of the conus medullaris, and the ventral lower cervical cord is spared. Mild cord expansion suspected below the level of the tumor.  There is also a much smaller (2 mm) enhancing cord metastasis at the T2-T3 level just to the right of midline (series 10, image 8).  No definite cauda equina tumor. No abnormal dural thickening or enhancement.  Otherwise, bilateral lung nodules are suggested, and central right liver mass may have increased (series 7, image 39). See Chest abdomen pelvis CT 01/14/2014.  IMPRESSION: 1. 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 Associated spinal cord edema is widespread. 2. Diffuse osseous metastatic disease. Study discussed by telephone with Dr. [name] on 01/21/2014 at 15:29 .   Electronically Signed   By: Truman Hayward  [place] Crane M.D.   On: 01/21/2014 15:31   Mr Lumbar Spine W Wo Contrast  01/20/2014   CLINICAL DATA:  New onset of lower extremity weakness. Breast cancer.  EXAM: MRI LUMBAR SPINE WITHOUT AND WITH CONTRAST  TECHNIQUE: Multiplanar and  multiecho pulse sequences of the lumbar spine were obtained without and with intravenous contrast.  CONTRAST:  52mL MULTIHANCE GADOBENATE DIMEGLUMINE 529 MG/ML IV SOLN  COMPARISON:  CT ABD/PELVIS W CM dated 01/14/2014; MR HEAD WO/W CM dated 01/20/2014; NM PET IMAGE RESTAG (PS) SKULL BASE TO THIGH dated 10/08/2013  FINDINGS: The scan extends from midportion T11 through the sacrum.  Widespread marrow heterogeneity reflecting osseous metastatic disease and post treatment effect. No areas of pathologic compression deformity are evident. There is no visible epidural tumor. The alignment is anatomic.  No pelvic masses.  Bladder decompressed with Foley catheter.  No disc protrusion or spinal stenosis. Post infusion imaging demonstrates no abnormal intraspinal enhancement of the nerve roots or distal conus.  On sagittal T2 and STIR imaging, the conus appears abnormal, displaying intramedullary T2 bright signal and slight cord enlargement. An intramedullary metastasis above the field-of-view, at T10 or above, is suspected, with associated distal cord edema. There is no definite abnormal enhancement of the visualized thoracic cord or conus on post infusion imaging.  IMPRESSION: Suspected intramedullary metastasis above the field-of-view of lumbar spine MRI. The distal thoracic cord and conus appear to display T2 bright signal concerning for edema. Thoracic spine MRI without and with contrast recommended for further evaluation.  No evidence for spinal stenosis, dropped metastases to the cauda equina, or intraspinal mass lesion in the lumbar or sacral segments.  Widespread osseous disease without pathologic compression fracture or malalignment.   Electronically Signed   By: Rolla Flatten M.D.   On: 01/20/2014 19:19   Dg Chest Port 1 View  01/29/2014   CLINICAL DATA:  FEVER FEVER  EXAM: PORTABLE CHEST - 1 VIEW  COMPARISON:  MR T SPINE W/CM dated 01/21/2014; CT CHEST W/CM dated 01/14/2014; DG CHEST 1V PORT dated 09/19/2012  FINDINGS:  The cardiac silhouette is enlarged. There are no focal infiltrates, effusions, nor edema. Stable prominence of the interstitial markings is appreciated. A left sided porta catheter is demonstrated via a subclavian approach with tip projecting in the region of the superior vena cava . No acute osseous abnormalities.  IMPRESSION: No evidence of acute cardiopulmonary disease. Chronic interstitial changes.   Electronically Signed   By: Margaree Mackintosh M.D.   On: 01/29/2014 19:50         Subjective: Patient denies fevers, chills, headache, chest pain, dyspnea, nausea, vomiting, diarrhea, abdominal pain, dysuria, hematuria. Has a lot of anxiety over returning to SNF.   Objective: Filed Vitals:   02/18/14 2049 02/19/14 0526 02/19/14 0717 02/19/14 1421  BP: 122/76 162/84    Pulse: 106 94    Temp: 98 F (36.7 C) 99 F (37.2 C)    TempSrc: Oral Oral    Resp: 24 22    Height:      Weight:  46.085 kg (101 lb 9.6 oz)    SpO2: 100% 100% 98% 98%    Intake/Output Summary (Last 24 hours) at 02/19/14 1548 Last data filed at 02/19/14 0900  Gross per 24 hour  Intake    120 ml  Output   1900 ml  Net  -1780 ml   Weight change: 0.185 kg (6.5 oz) Exam:   General:  Pt is alert, follows commands appropriately, not in acute distress  HEENT: No icterus, No thrush,  [place]/AT  Cardiovascular: RRR, S1/S2, no rubs, no gallops  Respiratory: Scattered bibasilar rales. No wheezing.  Abdomen: Soft/+BS, non tender, non distended, no guarding  Extremities: trace LE edema, No lymphangitis, No petechiae, No rashes, no synovitis  Data Reviewed: Basic Metabolic Panel:  Recent Labs Lab 02/17/14 1527 02/17/14 1704 02/18/14 0736 02/18/14 1555 02/19/14 0537  NA 123* 124* 129* 127* 129*  K 3.2* 3.2* 3.6* 3.0* 3.1*  CL 87* 87* 93* 89* 91*  CO2 23 24 23 24 25   GLUCOSE 216* 175* 124* 231* 156*  BUN 5* 4* 7 7 7   CREATININE 0.21* 0.24* 0.28* 0.24* 0.23*  CALCIUM 8.3* 8.3* 8.1* 8.4 8.5  MG  --   --  2.0   --   --    Liver Function Tests:  Recent Labs Lab 02/14/14 1946 02/19/14 0537  AST 40* 40*  ALT 49* 43*  ALKPHOS 376* 343*  BILITOT 1.1 0.5  PROT 6.7 5.9*  ALBUMIN 2.9* 2.5*   No results found for this basename: LIPASE, AMYLASE,  in the last 168 hours No results found for this basename: AMMONIA,  in the last 168 hours CBC:  Recent Labs Lab 02/14/14 1946 02/14/14 2208 02/16/14 0724 02/17/14 0755 02/18/14 0736 02/19/14 0537  WBC 6.8  --  7.1 6.1 5.6 6.2  NEUTROABS 5.7  --   --   --   --  4.7  HGB 9.6* 10.2* 8.3* 7.5* 10.7* 10.6*  HCT 25.7* 30.0* 23.1* 20.7* 30.1* 30.3*  MCV 74.3*  --  76.7* 78.4 79.6 79.7  PLT 109*  --  113* 108* 73* 67*   Cardiac Enzymes:  Recent Labs Lab 02/14/14 1946  TROPONINI <0.30   BNP: No components found with this basename: POCBNP,  CBG:  Recent Labs Lab 02/18/14 1119 02/18/14 1628 02/18/14 2054 02/19/14 0744 02/19/14 1137  GLUCAP 148* 198* 157* 114* 95    Recent Results (from the past 240 hour(s))  CULTURE, BLOOD (ROUTINE X 2)     Status: None   Collection Time    02/14/14  7:46 PM      Result Value Ref Range Status   Specimen Description BLOOD PORTA CATH DRAWN BY RN   Final   Special Requests BOTTLES DRAWN AEROBIC AND ANAEROBIC 6CC   Final   Culture NO GROWTH 5 DAYS   Final   Report Status 02/19/2014 FINAL   Final  CULTURE, BLOOD (ROUTINE X 2)     Status: None   Collection Time    02/14/14  7:52 PM      Result Value Ref Range Status   Specimen Description BLOOD LEFT ANTECUBITAL   Final   Special Requests BOTTLES DRAWN AEROBIC AND ANAEROBIC 6CC   Final   Culture NO GROWTH 5 DAYS   Final   Report Status 02/19/2014 FINAL   Final  URINE CULTURE     Status: None   Collection Time    02/14/14  8:16 PM      Result Value Ref Range Status   Specimen Description URINE, CATHETERIZED   Final   Special Requests NONE   Final   Culture  Setup Time     Final   Value: 02/15/2014 22:57     Performed at [facility] Count     Final   Value: 85,000 COLONIES/ML     Performed at [facility]   Culture     Final   Value: STAPHYLOCOCCUS SPECIES (COAGULASE  NEGATIVE)     Note: RIFAMPIN AND GENTAMICIN SHOULD NOT BE USED AS SINGLE DRUGS FOR TREATMENT OF STAPH INFECTIONS.     Performed at [facility]   Report Status 02/17/2014 FINAL   Final   Organism ID, Bacteria STAPHYLOCOCCUS SPECIES (COAGULASE NEGATIVE)   Final     Scheduled Meds: . sodium chloride  250 mL Intravenous Once  . acetaminophen  650 mg Oral Once  . B-complex with vitamin C  1 tablet Oral Daily  . calcium-vitamin D  2 tablet Oral Q breakfast  . ceFEPime (MAXIPIME) IV  1 g Intravenous Q8H  . collagenase   Topical Daily  . diphenhydrAMINE  25 mg Oral Once  . feeding supplement (RESOURCE BREEZE)  1 Container Oral TID BM  . gabapentin  100 mg Oral BID  . hydrocortisone  20 mg Oral BID  . insulin aspart  0-9 Units Subcutaneous TID WC  . ipratropium-albuterol  3 mL Nebulization Q6H  . levothyroxine  100 mcg Oral QAC breakfast  . multivitamin with minerals  1 tablet Oral Daily   Continuous Infusions: . sodium chloride 75 mL/hr at 02/19/14 0317     Time Spent: 35 minutes.  Erline Hau, MD  [facility] Pager [phone]  If 7PM-7AM, please contact night-coverage [email or web address] Password [facility] 02/19/2014, 3:48 PM   LOS: 5 days

## 2014-02-19 NOTE — Progress Notes (Signed)
Nivolumab infusion started as ordered. Blood return checked and present. Med infusing over 1 hr and with .22 micron filter. No complaints voiced.

## 2014-02-19 NOTE — Progress Notes (Signed)
Subjective: Patient seen in bed.  She complained about her IV beeping last night with no response from nurse.  This kept her up last night.  I personally reviewed and went over laboratory results with the patient.  The results are noted within this dictation.   Objective: Vital signs in last 24 hours: Temp:  [98 F (36.7 C)-99 F (37.2 C)] 99 F (37.2 C) (04/22 0526) Pulse Rate:  [94-106] 94 (04/22 0526) Resp:  [15-27] 22 (04/22 0526) BP: (122-162)/(69-84) 162/84 mmHg (04/22 0526) SpO2:  [98 %-100 %] 98 % (04/22 0717) Weight:  [101 lb 9.6 oz (46.085 kg)] 101 lb 9.6 oz (46.085 kg) (04/22 0526)  Intake/Output from previous day: 04/21 0800 - 04/22 0759 In: 100 [I.V.:100] Out: 2450 [Urine:2450] Intake/Output this shift: Total I/O In: 100 [I.V.:100] Out: 2450 [Urine:2450]  General appearance: alert, appears stated age and no distress  Lab Results:   Recent Labs  02/18/14 0736 02/19/14 0537  WBC 5.6 6.2  HGB 10.7* 10.6*  HCT 30.1* 30.3*  PLT 73* 67*   BMET  Recent Labs  02/18/14 1555 02/19/14 0537  NA 127* 129*  K 3.0* 3.1*  CL 89* 91*  CO2 24 25  GLUCOSE 231* 156*  BUN 7 7  CREATININE 0.24* 0.23*  CALCIUM 8.4 8.5    Studies/Results: No results found.  Medications: I have reviewed the patient's current medications.  Assessment/Plan: 1. 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 Today is day 1 of cycle 2 as Nivolumab will be administered today. Patient desires continued therapy. 2. Hyponatremia, likely iatrogenic hypothyroidism induced with a low urine Na and low osmolality consistent with the effects of hypothyroidism. Cannot call this SIADH in the setting of frank hypothyroidism. On levothyroxine. Addison's disease is less likely given K+ is WNL. Fluid restrictions removed on 02/17/2014. 3. Anemia, normocytic,  hyperchromic. S/P 2 units of PRBCs on 4/20. Anemia panel ordered to rule vitamin deficiencies.  Will order Epo level today. 4. Thrombocytopenia, not chemotherapy induced as Nivolumab is not cytotoxic.  5. Poor goal of care decisions by patient despite knowledge of poor prognosis. 6. Full Code 7. CT angio of chest demonstrates stability of pulmonary metastatic disease with slight enlargement of hepatic metastases which may be a reflection of response of Nivolumab. 8. Consider stopping empiric antibiotics. 9. Today we will administer Nivolumab for cycle 2.  Patient and plan discussed with Dr. Farrel Gobble and he is in agreement with the aforementioned.     LOS: 5 days    Debbie Larsen 02/19/2014

## 2014-02-19 NOTE — Progress Notes (Signed)
Pt requested to see the [facility].  Pt requested to not be disturbed while in with [facility].  Pt has a lead currently off.  Notified Central Telemetry to honor pt request to not be disturbed.  Will correct leads after visit.

## 2014-02-19 NOTE — Progress Notes (Signed)
nivolumab completed and tolerated without incidence.

## 2014-02-19 NOTE — Progress Notes (Signed)
Asked Pharmacy Tech about pt Corifef and Bcomplex medications that were due this morning.  Stated they did not have medications during delivery and did not see it upstairs.  Told Tech this medication was due at 0800 and 1000.  Still has not been delivered to administer.

## 2014-02-20 ENCOUNTER — Inpatient Hospital Stay
Admission: RE | Admit: 2014-02-20 | Discharge: 2014-03-09 | Disposition: A | Discharge status: Other Acute Inpatient Hospital | Payer: Medicaid Other | Source: Ambulatory Visit | Attending: Internal Medicine | Admitting: Internal Medicine

## 2014-02-20 DIAGNOSIS — D696 Thrombocytopenia, unspecified: Secondary | ICD-10-CM | POA: Diagnosis not present

## 2014-02-20 DIAGNOSIS — E871 Hypo-osmolality and hyponatremia: Secondary | ICD-10-CM | POA: Diagnosis not present

## 2014-02-20 DIAGNOSIS — C801 Malignant (primary) neoplasm, unspecified: Secondary | ICD-10-CM | POA: Diagnosis not present

## 2014-02-20 DIAGNOSIS — C7800 Secondary malignant neoplasm of unspecified lung: Secondary | ICD-10-CM

## 2014-02-20 DIAGNOSIS — D649 Anemia, unspecified: Secondary | ICD-10-CM | POA: Diagnosis not present

## 2014-02-20 LAB — BASIC METABOLIC PANEL
BUN: 7 mg/dL (ref 6–23)
CALCIUM: 8.2 mg/dL — AB (ref 8.4–10.5)
CO2: 26 meq/L (ref 19–32)
CREATININE: 0.24 mg/dL — AB (ref 0.50–1.10)
Chloride: 92 mEq/L — ABNORMAL LOW (ref 96–112)
GFR calc Af Amer: >90 mL/min (ref >90)
GFR calc non Af Amer: >90 mL/min (ref >90)
Glucose, Bld: 115 mg/dL — ABNORMAL HIGH (ref 70–99)
Potassium: 3.3 mEq/L — ABNORMAL LOW (ref 3.7–5.3)
Sodium: 130 mEq/L — ABNORMAL LOW (ref 137–147)

## 2014-02-20 LAB — CBC
HCT: 30 % — ABNORMAL LOW (ref 36.0–46.0)
Hemoglobin: 10.5 g/dL — ABNORMAL LOW (ref 12.0–15.0)
MCH: 28.2 pg (ref 26.0–34.0)
MCHC: 35 g/dL (ref 30.0–36.0)
MCV: 80.6 fL (ref 78.0–100.0)
PLATELETS: 56 10*3/uL — AB (ref 150–400)
RBC: 3.72 MIL/uL — ABNORMAL LOW (ref 3.87–5.11)
RDW: 16.4 % — AB (ref 11.5–15.5)
WBC: 5 10*3/uL (ref 4.0–10.5)

## 2014-02-20 LAB — GLUCOSE, CAPILLARY
GLUCOSE-CAPILLARY: 77 mg/dL (ref 70–99)
Glucose-Capillary: 107 mg/dL — ABNORMAL HIGH (ref 70–99)

## 2014-02-20 MED ORDER — LEVOTHYROXINE SODIUM 100 MCG PO TABS: 100.0000 ug | ORAL_TABLET | Freq: Every day | ORAL | Status: DC

## 2014-02-20 MED ORDER — DIAZEPAM 5 MG/ML PO CONC: 5.0000 mg | Freq: Four times a day (QID) | ORAL | Status: DC | PRN

## 2014-02-20 MED ORDER — HYDROCODONE-ACETAMINOPHEN 5-325 MG PO TABS: 1.0000 | ORAL_TABLET | ORAL | Status: DC | PRN

## 2014-02-20 NOTE — Discharge Summary (Signed)
Physician Discharge Summary  Debbie Larsen OEV:[id number] DOB: 05/18/1951 DOA: 02/14/2014  PCP: Melrose Nakayama, MD  Admit date: 02/14/2014 Discharge date: 02/20/2014  Time spent: 45 minutes  Recommendations for Outpatient Follow-up:  -Will be discharged to SNF today in stable condition.  -Will follow up with oncology as scheduled.  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 No fevers. Pt has disease in lung And is always sob. No n/v/d. Not eating well. On labs her na is 38. Pt is mentating very well, actually surprised at how clear her mental status is right now with this degree of hyponatremia. In the last 2 weeks her na level was normal. She is able to answer all questions and is oriented x 3. She has paraplegia from a spinal lesion, which is suppose to be getting radiation but hasnt started yet. Her last chemo immuno infusion was 2 weeks ago. She is DNR. Hospitalist admission was requested.   Hospital Course:   Acute metabolic encephalopathy  -Secondary to hyponatremia  -Resolved with correction of hyponatremia  -Serum appears to have stabilized in the upper 120s , low 130s.  Hyponatremia  -gradually improving (initially 103) ; up to 130 on DC. -Multifactorial due to?? SIADH with a combination of poor solute intake, volume depletion and  hypothyroidism (Nivolumab induced)  -FeNa 0.35%  -Na increasing even after stopping NS  -TSH--15.36  -Urine Osm 145, sOsm 223   Metastatic poorly differentiated carcinoma  -Metastasis to lung, brain, spinal cord, liver  -Suspect lung versus breast primary  -Patient is in denial regarding prognosis  -Most recently received Nivolumab on 02/19/14  -Oncology is following-Infusion is every 2 weeks.  -Patient continues to have unrealistic expectations  -After discussion with the patient, she has decided to continue [facility] oncology input and recommendations.   Tachycardia/dyspnea  -CT angiogram chest--negative for PE but shows increased bilateral upper lobe groundglass opacities with new ill-defined lower lobe nodular areas with considerations for infectious, inflammatory, and neoplastic etiologies-- also revealed increased size of liver lesions  -Was empirically start the patient on IV antibiotics for treatment of possible infectious process; which were later discontinued due to lack of clinical signs of infection. -EKG--sinus tachycardia, nonspecific ST-T wave change   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  -Continue Cortef   Sacral decubitus ulcer  -Counsulted wound care  -Air mattress   Impaired glucose tolerance  -Secondary to chronic steroid use  -01/31/2014 hemoglobin A1c 6.5  -NovoLog sliding scale   Hypothyroidism  -Continue Synthroid  -TSH 15.36  -increase synthroid 100 mcg daily   Severe protein calorie malnutrition  -Nutritional supplementation   Anemia of Chronic Disease  -transfused 2 units PRBC 02/17/14   Bacteriuria  -No significant pyuria  -Would not treat.    Procedures:  None   Consultations:  oncology  Discharge Instructions  Discharge Orders   Future Appointments Provider Department Dept Phone   03/05/2014 9:45 AM [place] [phone]   03/05/2014 10:00 AM [facility] Provider  [place] [phone]   03/19/2014 9:45 AM [place] [phone]   03/19/2014 10:00 AM [facility] Provider [place] [phone]   Future Orders Complete By Expires   Complete patient signature process for consent form  02/17/2014 02/18/2015   Type and screen  02/17/2014 02/17/2015   Discontinue IV  As directed    Increase activity slowly  As directed    Practitioner attestation of consent  As directed 02/18/2015   Questions:     Procedure:  Blood Product(s)       Medication List    STOP taking these medications       methylPREDNISolone acetate 40 MG/ML injection  Commonly known as:  DEPO-MEDROL      TAKE these medications       acetaminophen 325 MG tablet  Commonly known as:  TYLENOL  Take 2 tablets (650 mg total) by mouth every 6 (six) hours as needed for moderate pain, fever or headache.     alum & mag hydroxide-simeth 200-200-20 MG/5ML suspension  Commonly known as:  MAALOX/MYLANTA  Take 15 mLs by mouth every 4 (four) hours as needed for indigestion or heartburn.     B-complex with vitamin C tablet  Take 1 tablet by mouth daily.     calcium-vitamin D 500-200 MG-UNIT per tablet  Commonly known as:  OSCAL WITH D  Take 2 tablets by mouth daily with breakfast.     diazepam 5 MG/ML solution  Commonly known as:  DIAZEPAM INTENSOL  Take 1 mL (5 mg total) by mouth every 6 (six) hours as needed for anxiety (or slepp).     feeding supplement (RESOURCE BREEZE) Liqd  Take 1 Container by mouth 3 (three) times daily between meals.     gabapentin 100 MG capsule  Commonly known as:  NEURONTIN  Take 1 capsule (100 mg total) by mouth 2 (two) times daily.     HYDROcodone-acetaminophen 5-325 MG per tablet  Commonly known as:  NORCO/VICODIN  Take 1 tablet by mouth every 4 (four) hours as needed for moderate pain.     hydrocortisone 10 MG tablet  Commonly known as:  CORTEF  Take 2 tablets (20 mg total) by mouth 2  (two) times daily.     levothyroxine 100 MCG tablet  Commonly known as:  SYNTHROID, LEVOTHROID  Take 1 tablet (100 mcg total) by mouth daily before breakfast.     lidocaine-prilocaine cream  Commonly known as:  EMLA  Apply topically as needed. Apply to port 1-2 hours before procedure     magnesium citrate Soln  Take 296 mLs (1 Bottle total) by mouth daily as needed for severe constipation.     multivitamin with minerals Tabs tablet  Take 1 tablet by mouth daily.     prochlorperazine 10 MG tablet  Commonly known as:  COMPAZINE  Take 1 tablet (10 mg total) by mouth every 6 (six) hours as needed for nausea or vomiting.     senna-docusate 8.6-50 MG per tablet  Commonly known as:  Senokot-S  Take 2 tablets by mouth at bedtime as needed for mild constipation.     sodium phosphate 7-19 GM/118ML Enem  Place 1 enema rectally daily as needed for severe  constipation.       No Known Allergies     Follow-up Information   Follow up with [facility] On 03/05/2014. (For cycle 3 of Nivolumab and MD appointment)    Contact information:   [address] [place] [address]        The results of significant diagnostics from this hospitalization (including imaging, microbiology, ancillary and laboratory) are listed below for reference.    Significant Diagnostic Studies: Dg Chest 2 View  02/14/2014   CLINICAL DATA:  Shortness of breath, metastatic breast cancer.  EXAM: CHEST  2 VIEW  COMPARISON:  DG CHEST 1V PORT dated 01/29/2014; CT CHEST W/CM dated 01/14/2014; CT CHEST W/CM dated 07/03/2013  FINDINGS: Trachea is midline. Left subclavian power port tip is in the SVC. Heart size stable. Pulmonary parenchymal lesions seen on 01/14/2014 are poorly appreciated. No pleural fluid. Surgical clips in the right axilla.  IMPRESSION: Pulmonary parenchymal lesions seen on 01/14/2014 are poorly appreciated on the current exam. No large areas of airspace consolidation. No pleural fluid.    Electronically Signed   By: Lorin Picket M.D.   On: 02/14/2014 20:29   Ct Angio Chest Pe W/cm &/or Wo Cm  02/15/2014   CLINICAL DATA:  Breast cancer. Status post lumpectomy, chemotherapy and radiation. Metastases to lung, bone and brain. Tachycardia, shortness of breath.  EXAM: CT ANGIOGRAPHY CHEST WITH CONTRAST  TECHNIQUE: Multidetector CT imaging of the chest was performed using the standard protocol during bolus administration of intravenous contrast. Multiplanar CT image reconstructions and MIPs were obtained to evaluate the vascular anatomy.  CONTRAST:  17mL OMNIPAQUE IOHEXOL 350 MG/ML SOLN  COMPARISON:  01/14/2014  FINDINGS: Left-sided Port-A-Cath is in place with the tip in the SVC. Heart is borderline in size. There is a small pericardial effusion. Aorta is normal caliber.  6 mm prevascular lymph node on image 25. Abnormal soft tissue in the right paratracheal region is difficult to measure as this appears to be rather diffuse and ill-defined. This has increased since prior study.  Right lower lobe pulmonary nodule on image 49 measures 7 mm, not significantly changed. Nodular area in the left upper lobe in the perihilar region on image 36 measures 10 mm compared with 9 mm previously, not significantly changed. Areas of peripheral ground-glass airspace opacity noted peripherally in the upper lobes bilaterally, increased since prior study. There are numerous small somewhat ill-defined lower lobe pulmonary nodular areas, many of which are new since prior study. These are small, 3-4 mm or less in size. Marland Kitchen  Postsurgical changes in the right breast and axilla.  Diffuse sclerotic lesions throughout the thoracic spine compatible with metastases. Multiple bilateral rib lesions.  Multiple rim enhancing lesions within the liver. 2.4 cm lesion within the right hepatic lobe compared with 1.8 cm previously. 1.8 cm lesion in the left hepatic lobe on image 69 compared with 8 mm previously. The other lesions in the  dome with the liver are not as well visualized on this Chest CT due to bolus timing.  Review of the MIP images confirms the above findings.  IMPRESSION: New patchy ground-glass airspace opacities within the upper lobes bilaterally peripherally. There are also numerous small ill-defined subcentimeric pulmonary nodules in the lower lobes. Previously seen nodular densities in the lungs are relatively stable. Overall process in the lungs could reflect lymphangitic spread of tumor, atypical infections or drug toxicity. Pulmonary nodularity scratch head the pulmonary nodules are concerning for metastases.  Small pericardial effusion.  Ill-defined  abnormal soft tissue in the mediastinum, predominantly in the paratracheal region, difficult to measure due to its indistinct appearance.  Slight enlargement of at least 2 hepatic metastases.  Stable diffuse bony metastases.   Electronically Signed   By: Rolm Baptise M.D.   On: 02/15/2014 20:09   Mr Thoracic Spine W Contrast  01/21/2014   CLINICAL DATA:  63 year old female with stage IV breast cancer. New onset lower extremity weakness and unable to move the lower extremities. Initial encounter.  EXAM: MRI THORACIC SPINE WITH CONTRAST  TECHNIQUE: Multiplanar and multiecho pulse sequences of the thoracic spine were obtained with intravenous contrast.  CONTRAST:  50mL MULTIHANCE GADOBENATE DIMEGLUMINE 529 MG/ML IV SOLN  COMPARISON:  Lumbar MRI 01/20/2014.  FINDINGS: Diffusely abnormal bone marrow signal. Subsequently, cervical vertebral delineation is difficult on the sagittal scout view. The numbering system here agrees with that on the recent lumbar exam.  Diffuse thoracic spine and widespread posterior rib metastases. Still, no thoracic epidural or extraosseous tumor identified.  Intra-axial partially exophytic solidly enhancing mass of the thoracic spinal cord centered at the T8 level measures 7 x 9 x 17 mm (AP by transverse by CC) and is associated with widespread thoracic  spinal cord edema. Only a portion of the conus medullaris, and the ventral lower cervical cord is spared. Mild cord expansion suspected below the level of the tumor.  There is also a much smaller (2 mm) enhancing cord metastasis at the T2-T3 level just to the right of midline (series 10, image 8).  No definite cauda equina tumor. No abnormal dural thickening or enhancement.  Otherwise, bilateral lung nodules are suggested, and central right liver mass may have increased (series 7, image 39). See Chest abdomen pelvis CT 01/14/2014.  IMPRESSION: 1. 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 Associated spinal cord edema is widespread. 2. Diffuse osseous metastatic disease. Study discussed by telephone with Dr. [name] on 01/21/2014 at 15:29 .   Electronically Signed   By: Lars Pinks M.D.   On: 01/21/2014 15:31   Dg Chest Port 1 View  01/29/2014   CLINICAL DATA:  FEVER FEVER  EXAM: PORTABLE CHEST - 1 VIEW  COMPARISON:  MR T SPINE W/CM dated 01/21/2014; CT CHEST W/CM dated 01/14/2014; DG CHEST 1V PORT dated 09/19/2012  FINDINGS: The cardiac silhouette is enlarged. There are no focal infiltrates, effusions, nor edema. Stable prominence of the interstitial markings is appreciated. A left sided porta catheter is demonstrated via a subclavian approach with tip projecting in the region of the superior vena cava . No acute osseous abnormalities.  IMPRESSION: No evidence of acute cardiopulmonary disease. Chronic interstitial changes.   Electronically Signed   By: Margaree Mackintosh M.D.   On: 01/29/2014 19:50    Microbiology: Recent Results (from the past 240 hour(s))  CULTURE, BLOOD (ROUTINE X 2)     Status: None   Collection Time    02/14/14  7:46 PM      Result Value Ref Range Status   Specimen Description BLOOD PORTA CATH DRAWN BY RN   Final   Special Requests BOTTLES DRAWN AEROBIC AND ANAEROBIC 6CC   Final   Culture NO GROWTH 5 DAYS   Final   Report Status 02/19/2014 FINAL    Final  CULTURE, BLOOD (ROUTINE X 2)     Status: None   Collection Time    02/14/14  7:52 PM      Result Value Ref Range Status  Specimen Description BLOOD LEFT ANTECUBITAL   Final   Special Requests BOTTLES DRAWN AEROBIC AND ANAEROBIC 6CC   Final   Culture NO GROWTH 5 DAYS   Final   Report Status 02/19/2014 FINAL   Final  URINE CULTURE     Status: None   Collection Time    02/14/14  8:16 PM      Result Value Ref Range Status   Specimen Description URINE, CATHETERIZED   Final   Special Requests NONE   Final   Culture  Setup Time     Final   Value: 02/15/2014 22:57     Performed at [facility]     Final   Value: 85,000 COLONIES/ML     Performed at [facility]   Culture     Final   Value: STAPHYLOCOCCUS SPECIES (COAGULASE NEGATIVE)     Note: RIFAMPIN AND GENTAMICIN SHOULD NOT BE USED AS SINGLE DRUGS FOR TREATMENT OF STAPH INFECTIONS.     Performed at [facility]   Report Status 02/17/2014 FINAL   Final   Organism ID, Bacteria STAPHYLOCOCCUS SPECIES (COAGULASE NEGATIVE)   Final     Labs: Basic Metabolic Panel:  Recent Labs Lab 02/17/14 1704 02/18/14 0736 02/18/14 1555 02/19/14 0537 02/20/14 0614  NA 124* 129* 127* 129* 130*  K 3.2* 3.6* 3.0* 3.1* 3.3*  CL 87* 93* 89* 91* 92*  CO2 24 23 24 25 26   GLUCOSE 175* 124* 231* 156* 115*  BUN 4* 7 7 7 7   CREATININE 0.24* 0.28* 0.24* 0.23* 0.24*  CALCIUM 8.3* 8.1* 8.4 8.5 8.2*  MG  --  2.0  --   --   --    Liver Function Tests:  Recent Labs Lab 02/14/14 1946 02/19/14 0537  AST 40* 40*  ALT 49* 43*  ALKPHOS 376* 343*  BILITOT 1.1 0.5  PROT 6.7 5.9*  ALBUMIN 2.9* 2.5*   No results found for this basename: LIPASE, AMYLASE,  in the last 168 hours No results found for this basename: AMMONIA,  in the last 168 hours CBC:  Recent Labs Lab 02/14/14 1946  02/16/14 0724 02/17/14 0755 02/18/14 0736 02/19/14 0537 02/20/14 0614  WBC 6.8  --  7.1 6.1 5.6 6.2 5.0  NEUTROABS 5.7   --   --   --   --  4.7  --   HGB 9.6*  < > 8.3* 7.5* 10.7* 10.6* 10.5*  HCT 25.7*  < > 23.1* 20.7* 30.1* 30.3* 30.0*  MCV 74.3*  --  76.7* 78.4 79.6 79.7 80.6  PLT 109*  --  113* 108* 73* 67* 56*  < > = values in this interval not displayed. Cardiac Enzymes:  Recent Labs Lab 02/14/14 1946  TROPONINI <0.30   BNP: BNP (last 3 results)  Recent Labs  02/14/14 1946  PROBNP 263.1*   CBG:  Recent Labs Lab 02/19/14 0744 02/19/14 1137 02/19/14 1640 02/20/14 0801 02/20/14 1135  GLUCAP 114* 95 113* 107* 77       Signed:  Erline Hau  [facility] Pager: [phone] 02/20/2014, 11:55 AM

## 2014-02-20 NOTE — Progress Notes (Signed)
Subjective: Patient seen in bed.  She will be discharged back to the [facility] facility.   She has a few requests: 1. Visiting nurse- unfortunately, the [facility] cannot provide that service.  She reports that the Chaplain sees her regularly and I will ask for updates from the [facility] when I see her in passing.  2. Foley Catheter change- she notes that it has been 2 1/2 weeks since its been changed.  3. Physical therapy for Upper extremities.    Objective: Vital signs in last 24 hours: Temp:  [97.7 F (36.5 C)-98.2 F (36.8 C)] 97.7 F (36.5 C) (04/23 0629) Pulse Rate:  [89-102] 89 (04/23 0629) Resp:  [21-22] 21 (04/23 0629) BP: (138-147)/(74-85) 147/85 mmHg (04/23 0629) SpO2:  [97 %-100 %] 99 % (04/23 0717) Weight:  [99 lb 10.4 oz (45.2 kg)] 99 lb 10.4 oz (45.2 kg) (04/23 0500)  Intake/Output from previous day: 04/22 0800 - 04/23 0759 In: 480 [P.O.:480] Out: 3075 [Urine:3075] Intake/Output this shift:    General appearance: alert, cooperative and no distress  Lab Results:   Recent Labs  02/19/14 0537 02/20/14 0614  WBC 6.2 5.0  HGB 10.6* 10.5*  HCT 30.3* 30.0*  PLT 67* 56*   BMET  Recent Labs  02/19/14 0537 02/20/14 0614  NA 129* 130*  K 3.1* 3.3*  CL 91* 92*  CO2 25 26  GLUCOSE 156* 115*  BUN 7 7  CREATININE 0.23* 0.24*  CALCIUM 8.5 8.2*    Studies/Results: No results found.  Medications: I have reviewed the patient's current medications.  Assessment/Plan: 1. 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 Day 1 of cycle 2 as Nivolumab  2. Hyponatremia, likely iatrogenic hypothyroidism induced with a low urine Na and low osmolality consistent with the effects of hypothyroidism. Cannot call this SIADH in the setting of frank hypothyroidism. On levothyroxine. Addison's disease is less likely given K+  is WNL. Fluid restrictions removed on 02/17/2014. 3. Anemia, normocytic, hyperchromic. S/P 2 units of PRBCs on 4/20. Anemia panel ordered to rule vitamin deficiencies and is negative. 4. Thrombocytopenia, not chemotherapy induced as Nivolumab is not cytotoxic.  5. Poor goal of care decisions by patient despite knowledge of poor prognosis. 6. Full Code 7. CT angio of chest demonstrates stability of pulmonary metastatic disease with slight enlargement of hepatic metastases which may be a reflection of response of Nivolumab. 8. Will order labs for M-Thurs at the [facility]. 9.  Will order a foley change at the [facility] 10. Will defer physical therapy to her attending physician. 11. Follow-up as scheduled at the [facility].   Patient and plan discussed with Dr. Farrel Gobble and he is in agreement with the aforementioned.     LOS: 6 days    [facility] 02/20/2014

## 2014-02-20 NOTE — Clinical Social Work Note (Signed)
Patient stable for dc back to SNF bed at [facility] today per MD. Patient agreeable to this plan- wil coordinate with [facility] SNF and RN.  Eduard Clos, MSW, [place]

## 2014-02-21 ENCOUNTER — Other Ambulatory Visit: Payer: Self-pay | Admitting: *Deleted

## 2014-02-21 ENCOUNTER — Non-Acute Institutional Stay (SKILLED_NURSING_FACILITY): Payer: Medicaid Other | Admitting: Internal Medicine

## 2014-02-21 DIAGNOSIS — F411 Generalized anxiety disorder: Secondary | ICD-10-CM

## 2014-02-21 DIAGNOSIS — C7931 Secondary malignant neoplasm of brain: Secondary | ICD-10-CM

## 2014-02-21 DIAGNOSIS — E876 Hypokalemia: Secondary | ICD-10-CM

## 2014-02-21 DIAGNOSIS — C787 Secondary malignant neoplasm of liver and intrahepatic bile duct: Secondary | ICD-10-CM

## 2014-02-21 DIAGNOSIS — R0989 Other specified symptoms and signs involving the circulatory and respiratory systems: Secondary | ICD-10-CM

## 2014-02-21 DIAGNOSIS — C7949 Secondary malignant neoplasm of other parts of nervous system: Secondary | ICD-10-CM

## 2014-02-21 DIAGNOSIS — D649 Anemia, unspecified: Secondary | ICD-10-CM

## 2014-02-21 DIAGNOSIS — C3490 Malignant neoplasm of unspecified part of unspecified bronchus or lung: Secondary | ICD-10-CM

## 2014-02-21 DIAGNOSIS — G8220 Paraplegia, unspecified: Secondary | ICD-10-CM

## 2014-02-21 DIAGNOSIS — Z853 Personal history of malignant neoplasm of breast: Secondary | ICD-10-CM

## 2014-02-21 DIAGNOSIS — E039 Hypothyroidism, unspecified: Secondary | ICD-10-CM

## 2014-02-21 DIAGNOSIS — E871 Hypo-osmolality and hyponatremia: Secondary | ICD-10-CM

## 2014-02-21 MED ORDER — HYDROCODONE-ACETAMINOPHEN 5-325 MG PO TABS: 1.0000 | ORAL_TABLET | ORAL | Status: AC | PRN

## 2014-02-21 MED ORDER — DIAZEPAM 5 MG/ML PO CONC: ORAL | Status: AC

## 2014-02-21 NOTE — Progress Notes (Signed)
Patient ID: Debbie Larsen, female   DOB: [birth year], 63 y.o.   MRN: [id number]   This is an acute visit.  Level of care skilled.  Facility [facility].  Chief complaint acute visit status post hospitalization for severe hyponatremia.  History of present illness.  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  In the facility she did appear to have some respiratory distress labored breathing and was sent to the ER were actually she was diagnosed with severe hyponatremia. The sodium level was 102  .  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  In regards to her metastatic cancer there has been metastasis to lung and brain spinal cord in labor suspect lung versus breast primary  Her hospital discharge patient continues to be in denial regarding her prognosis-she did receive Nivolumab on April 22 and she is followed by oncology.  Regards her respiratory issues a CT angiogram was negative for PE but did show increased bilateral upper lobe groundglass opacities and new ill-defined lower lobe nodular areas with considerations for infectious inflammatory or neoplastic etiology-liver lesions also had been increasing in size.  She was started empirically on IV antibiotics for treatment of possible infectious process that was later discontinued secondary to normal signs  of clinical infection.  Patient also was on a log sliding scale secondary to suspected impaired glucose tolerance because of her chronic steroid use-hemoglobin A1c was 6.5 on April 3.  In regards to hypothyroidism she was continued on her Synthroid it was increased in the hospital 200 mcg a day.  She also required any transfusion of 2 units packed red blood cells secondary to anemia her hemoglobin today is 10.9.  Previous surgeries include bilateral lumpectomies.  Previous medical history.  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  Hypothyroidism.  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  History of sacral decubitus ulcer.  History of severe hyponatremia as noted above.  Anemia of chronic disease.  Severe protein calorie malnutrition.      B.  Family medical social history has been reviewed per admission note on 02/05/2014.  I note her closest relative is her brother in [place] who is quite supportive and apparently visits often.  Apparently she has no family locally but she does have a neighbor who is actually with her today  Medications.  Tylenol 650 mg every 6 hours when necessary.  Aluminum magnesium hydroxide simethicone 200-200-20 mg-5 mL.  B complex vitamin C daily.  Calcium with vitamin D 500-200 mg unit 2 tabs daily.  Diazepam 5 mg-milliliters solution 1 mL every 6 hours when necessary anxiety or sleep.  Resource feeding supplement 3 times a day.  Neurontin 100 mg twice a day.  Vicodin 5-325 mg every 4 hours when necessary.  Hydrocortisone 10 mg take 2 tablets to equal 20 mg twice a day.  Synthroid 100 mcg daily.  Emla cream applied when necessary to her port  one to 2 hours before procedures.  Then using citrate daily as needed for severe constipation.  Multivitamin daily.  Compazine 10 mg every 6 hours when necessary nausea or vomiting.  Senna 8.6-5 mg tab 2 tablets each bedtime when necessary  constipation.  Sodium phosphatase 7-19 g-118 mL when necessary severe constipation --.  Studies.  02/15/2014.  CT Angio of the chest-showed new patchy groundglass airspace opacities within the upper lobes bilaterally-also numerous small ill-defined pulmonary nodules in the lower lobes.  Previous E. nodular densities in lungs relatively stable.  Small pericardial effusion slight enlargement of at least 2 hepatic metastasis-stable diffuse bony metastasis.  01/29/2014.  Chest x-ray showed no evidence of acute cardiopulmonary disease chronic interstitial changes.  01/21/2014-MR of thoracic spine-showed positive for metastatic disease at the thoracic spinal cord at T8 and also T2-T3-associated spinal cord edema widespread-diffuse osseous metastatic disease  Review of systems per  In general she is not complaining of any fever or chills says she feels relatively well.  Skin does not complaining of any rash or itching does have history of sacral issues as noted above this is followed by wound care.  Head ears eyes nose mouth and throat-does not complaining of visual changes or sore throat.  Respiratory does not complaining of shortness of breath although she says she has some chest congestion and would like a nebulizer treatment.  Cardiac-no complaints of chest pain or palpitations or edema.  GI he is not complaining of any nausea vomiting diarrhea or constipation today or abdominal pain.  GU has an indwelling Foley catheter does not complaining of dysuria.  Muscle skeletal at this point her discomfort appears to be controlled is not complaining of pain at bedside today.  Neurologic does have lower extremity paralysis secondary to spinal cord compression-does not complaining of dizziness or headache or numbness she is on Neurontin I suspect for some neuropathy.  Psych-does have a history of anxiety.  Physical exam  Physical exam.  Temperature 98.1 pulse 90 respirations 20 blood  pressure 115/72  In general this is a very frail middle-aged female in no distress lying comfortably in bed.  Her skin is warm and dry.  Pupils appear reactive to light sclera and conjunctiva are clear visual acuity appears grossly intact.  Oropharynx clear mucous membranes moist.  Chest does have somewhat diffuse coarse breath sounds on inspiration and expiration but there is no labored breathing.  Heart is regular rate and rhythm at 90 without murmur gallop or rub she does not have lower extremity edema.  Abdomen is somewhat protuberant which is her baseline it is soft does not appear to be tender there are positive bowel sounds.  GU-he does have a Foley catheter draining a significant amount of amber-colored urine.  Muscle skeletal has general frailty as well as lower extremity paralysis again as noted above she does not have any edema  -Neurologic as stated above her cranial nerves appear grossly intact speech is clear.  Psych-she is alert and oriented x3 pleasant and appropriate does not appear overtly anxious today.  Labs.  02/21/2014.  WBC 6.7 hemoglobin 10.9 platelets 63.  Sodium 131 potassium 3.3 BUN 8 creatinine 0.28.  Glucose 114.  02/19/2014.  AST 40 ALT 43 alkaline phosphatase 343 bilirubin 0.5 albumin of 2.5.  I do note on April 20 her hemoglobin was 7.5 again she did receive a transfusion hemoglobin now appears relatively stable at 10.9.  CBGs in hospital ran 77-100s.  Assessment and plan.  #1-severe hypo-natremia-apparently her confusion was  fairly minimal-her hyponatremia was resolved.  Serum sodium now appears stable will have to recheck this on Monday, April 27.  #2-history of metastatic poorly differentiated carcinoma again there is numerous metastasis as noted above she is followed [facility] he apparently she is receiving Nivolumab infusion every 2 weeks this is followed by oncology- her prognosis continues to be quite poor She continues on  hydrocortisone  #3-history of dyspnea on admission to hospital and tachycardia-she was ruled out for pulmonary embolism again CT scan did show suspected and crease in her lung metastasis-she did receive antibiotics for possibility of infection although clinically she was stable and the antibiotics were discontinued.  #4-hypothyroidism her TSH was elevated in hospital her Synthroid has been increased.  #5-severe protein calorie malnutrition with an albumin of 2.5 she is receiving supplements appears to have a relatively good appetite she had fruits on her plate today I noted she was also asking for chocolate milk and vegetable soup.  #6-anemia most likely chronic disease she did receive a transfusion hemoglobin appears stable but will have to keep a close eye on this will recheck this Monday, April 27 as well.  #7-apparently some history of bacteremia in the hospital without significant pyuria suggestion not to treat this since it is asymptomatic. 8- history of constipation she is on numerous agents apparently this has been stable since her return but will have to be monitored.  #9-anxiety-this is an issue at times she is on diazepam when necessary we'll continue to monitor this appears stable today.  #10-history of chest congestion-will give her aDuoneb  now and also order Xopenex every 6 hours when necessary--apparently Xopenex is not in the facility currently- I assured patient she can ask for this--at this point she does not want an x-ray would like to stay in the facility and not go out for an x-ray which is understandable she appears to be certainly stable will have to be monitored  #11-hypokalemia this appears to be somewhat mild and persistent-Will start her on potassium 20 mEq a day and recheck this on Monday as well.  [id number] note greater than 35 minutes spent assessing patient-addressing her concerns at bedside-and coordinating and formulating a plan of care for numerous  diagnoses-of note greater than 50% of time spent coordinating plan of care          .

## 2014-02-21 NOTE — Progress Notes (Signed)
UR chart review completed.  

## 2014-02-21 NOTE — Telephone Encounter (Signed)
[facility] 

## 2014-02-22 ENCOUNTER — Encounter: Payer: Self-pay | Admitting: Internal Medicine

## 2014-02-24 ENCOUNTER — Non-Acute Institutional Stay (SKILLED_NURSING_FACILITY): Payer: Medicaid Other | Admitting: Internal Medicine

## 2014-02-24 DIAGNOSIS — C7949 Secondary malignant neoplasm of other parts of nervous system: Secondary | ICD-10-CM

## 2014-02-24 DIAGNOSIS — C7931 Secondary malignant neoplasm of brain: Secondary | ICD-10-CM

## 2014-02-24 DIAGNOSIS — C3490 Malignant neoplasm of unspecified part of unspecified bronchus or lung: Secondary | ICD-10-CM

## 2014-02-24 DIAGNOSIS — E871 Hypo-osmolality and hyponatremia: Secondary | ICD-10-CM

## 2014-02-25 NOTE — Progress Notes (Signed)
Patient ID: Debbie Larsen, female   DOB: 1950/12/31, 63 y.o.   MRN: [id number]                  HISTORY & PHYSICAL  DATE:  02/24/2014    FACILITY: [facility]    LEVEL OF CARE:   SNF   CHIEF COMPLAINT:  Readmission to the facility after being admitted to [facility] with extreme hyponatremia.    HISTORY OF PRESENT ILLNESS:  This is a 63 year-old woman whom I admitted to the facility earlier this month.   She has a past medical history of right breast adenocarcinoma in 2002.  She received chemotherapy and radiation.    Nevertheless, she was admitted to the hospital in March with progressive leg weakness.  Work-up showed widespread metastatic adenocarcinoma.  She had brain and thoracic cord metastases, confirmed by MRI.  She was seen by Radiation Oncology and it was felt that there was no room for radiation.  She has known brain metastasis, pulmonary metastasis, liver metastasis, a soft tissue mass in the pelvis, as well as thoracic cord metastasis.  This has left her paraplegic.    When she came into the facility the first time, she has 1500 cc of urine in her bladder.  She was catheterized.    Lab work on 02/04/2014 showed a sodium of 137, a BUN of 16, and a creatinine of 1.45.    She was admitted to hospital with increasing confusion and was noted to have a sodium of 103.  The exact cause of this was not totally clear, ?SIADH with a combination of dehydration, poor sodium intake, and hypothyroidism although her TSH was only 15.36.  Her fractional excretion of sodium was 0.35%.   Apparently, this went up with normal saline and continued to rise.   She has been receiving nivolumab.  I do not know much about this drug and its  side effects.  In any case, her urine osmolality was 145 versus the serum osmolality of 223, which is hardly reminiscent of SIADH.  Nevertheless, her sodium came up to 130.  This was rechecked on 02/21/2014 since her arrival in the facility and her sodium was  131, her potassium 3.3, white count 6.7, hemoglobin 10.9, and a platelet count of 63,000.  REVIEW OF SYSTEMS:   CHEST/RESPIRATORY:  The patient is not complaining of shortness of breath.   CARDIAC:   No chest pain.   GI:  No diarrhea.   GU:  She still has her Foley catheter in.   SKIN:  She has developed a new unstageable pressure ulcer on her coccyx.    PHYSICAL EXAMINATION:   GENERAL APPEARANCE:  Increasingly frail-looking woman, but not in any distress.   She is alert and conversational.   CHEST/RESPIRATORY:  Upper airway sounds.  However, air entry is fairly good.   CARDIOVASCULAR:  CARDIAC:  She appears to be euvolemic.  Heart sounds are normal.  No gallops.   GASTROINTESTINAL:  LIVER/SPLEEN/KIDNEYS:  No liver, no spleen palpable.   ABDOMEN:  Her abdomen is nontender.   NEUROLOGICAL:   I see no major change in her neurologic status.   SENSATION/STRENGTH:  She has no movement in either leg, although she claims to have increasing sensation bilaterally.    ASSESSMENT/PLAN:  Profound hyponatremia of fairly rapid onset.  I am not completely certain of the source.  The work-up really did not support SIADH.  Her volume contraction certainly might have been the issue.  Nevertheless,  we have rechecked her sodium once and I will continue to follow this.    Hypothyroidism.  Felt to be secondary to her chemotherapy.    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    Urinary retention.  I am going to continue her Foley catheter.  I am not planning to attempt to remove this.     Unstageable wound over her coccyx.  This is covered with a thickened eschar.  I am not going to debride this.  We will continue Santyl-based dressings in a largely preventative role.    Dyspnea.  A CT scan of the chest was negative for PE, but showed bilateral upper lobe ground-glass opacities with a new ill-defined lower lobe nodularity consistent with neoplastic and/or infectious  etiologies.  It was also noted that she had increased size of her liver lesions.    Severe protein calorie malnutrition.    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    This lady is increasingly frail.  In the hospital, she changed her code status to Full Code.  She will continue with her chemotherapy.  I will continue to do palliative care on her in spite of this, including palliative wound care.    CPT CODE: 47829

## 2014-02-26 ENCOUNTER — Telehealth (HOSPITAL_COMMUNITY): Payer: Self-pay | Admitting: Oncology

## 2014-02-26 NOTE — Telephone Encounter (Signed)
Received a fax from [facility] on 02/25/2014 at 1538 hours.  "Labs that were requested, patient refused to have labs drawn."  I ordered labs for her while at the [facility] per her request when she was in the hospital last.  As stated above, she refused the labs.  [facility] 02/26/2014

## 2014-02-27 ENCOUNTER — Encounter (HOSPITAL_BASED_OUTPATIENT_CLINIC_OR_DEPARTMENT_OTHER): Payer: Medicaid Other

## 2014-02-27 DIAGNOSIS — C7952 Secondary malignant neoplasm of bone marrow: Secondary | ICD-10-CM

## 2014-02-27 DIAGNOSIS — Z95828 Presence of other vascular implants and grafts: Secondary | ICD-10-CM

## 2014-02-27 DIAGNOSIS — C801 Malignant (primary) neoplasm, unspecified: Secondary | ICD-10-CM

## 2014-02-27 DIAGNOSIS — Z452 Encounter for adjustment and management of vascular access device: Secondary | ICD-10-CM

## 2014-02-27 DIAGNOSIS — C3490 Malignant neoplasm of unspecified part of unspecified bronchus or lung: Secondary | ICD-10-CM

## 2014-02-27 DIAGNOSIS — C7951 Secondary malignant neoplasm of bone: Secondary | ICD-10-CM

## 2014-02-27 LAB — COMPREHENSIVE METABOLIC PANEL
ALBUMIN: 2.7 g/dL — AB (ref 3.5–5.2)
ALT: 77 U/L — ABNORMAL HIGH (ref 0–35)
AST: 63 U/L — ABNORMAL HIGH (ref 0–37)
Alkaline Phosphatase: 394 U/L — ABNORMAL HIGH (ref 39–117)
BUN: 9 mg/dL (ref 6–23)
CHLORIDE: 88 meq/L — AB (ref 96–112)
CO2: 26 mEq/L (ref 19–32)
CREATININE: 0.24 mg/dL — AB (ref 0.50–1.10)
Calcium: 8.7 mg/dL (ref 8.4–10.5)
GFR calc Af Amer: >90 mL/min (ref >90)
GFR calc non Af Amer: >90 mL/min (ref >90)
Glucose, Bld: 149 mg/dL — ABNORMAL HIGH (ref 70–99)
Potassium: 4.1 mEq/L (ref 3.7–5.3)
Sodium: 126 mEq/L — ABNORMAL LOW (ref 137–147)
Total Bilirubin: 0.4 mg/dL (ref 0.3–1.2)
Total Protein: 6.5 g/dL (ref 6.0–8.3)

## 2014-02-27 LAB — CBC WITH DIFFERENTIAL/PLATELET
BASOS ABS: 0 10*3/uL (ref 0.0–0.1)
Basophils Relative: 0 % (ref 0–1)
EOS ABS: 0 10*3/uL (ref 0.0–0.7)
Eosinophils Relative: 0 % (ref 0–5)
HCT: 29.1 % — ABNORMAL LOW (ref 36.0–46.0)
Hemoglobin: 10 g/dL — ABNORMAL LOW (ref 12.0–15.0)
Lymphocytes Relative: 13 % (ref 12–46)
Lymphs Abs: 0.9 10*3/uL (ref 0.7–4.0)
MCH: 27.9 pg (ref 26.0–34.0)
MCHC: 34.4 g/dL (ref 30.0–36.0)
MCV: 81.3 fL (ref 78.0–100.0)
MONO ABS: 0.5 10*3/uL (ref 0.1–1.0)
Monocytes Relative: 7 % (ref 3–12)
NEUTROS ABS: 5.5 10*3/uL (ref 1.7–7.7)
Neutrophils Relative %: 80 % — ABNORMAL HIGH (ref 43–77)
Platelets: 88 10*3/uL — ABNORMAL LOW (ref 150–400)
RBC: 3.58 MIL/uL — ABNORMAL LOW (ref 3.87–5.11)
RDW: 16.3 % — AB (ref 11.5–15.5)
WBC: 6.8 10*3/uL (ref 4.0–10.5)

## 2014-02-27 LAB — TSH: TSH: 12.44 u[IU]/mL — AB (ref 0.350–4.500)

## 2014-02-27 MED ORDER — HEPARIN SOD (PORK) LOCK FLUSH 100 UNIT/ML IV SOLN
INTRAVENOUS | Status: AC
  Filled 2014-02-27: qty 5

## 2014-02-27 MED ORDER — HEPARIN SOD (PORK) LOCK FLUSH 100 UNIT/ML IV SOLN
500.0000 [IU] | Freq: Once | INTRAVENOUS | Status: AC
  Administered 2014-02-27: 500 [IU] via INTRAVENOUS

## 2014-02-27 MED ORDER — SODIUM CHLORIDE 0.9 % IJ SOLN
10.0000 mL | INTRAMUSCULAR | Status: DC | PRN
  Administered 2014-02-27: 10 mL via INTRAVENOUS

## 2014-02-27 NOTE — Progress Notes (Signed)
Port already accessed on patient arrival to clinic. Obtained lab specimen as ordered. Flushed port per protocol. Patient and nursing home staff report they are not using her port for anything and they do not need it accessed. D/C port needle. Site WDL. Patient spoke with Dr.Formanek prior to leaving but was not convinced he was her doctor.

## 2014-03-05 ENCOUNTER — Encounter (HOSPITAL_COMMUNITY): Payer: Self-pay

## 2014-03-05 ENCOUNTER — Telehealth (HOSPITAL_COMMUNITY): Payer: Self-pay | Admitting: Hematology and Oncology

## 2014-03-05 ENCOUNTER — Encounter (HOSPITAL_COMMUNITY): Payer: Medicaid Other | Attending: Hematology and Oncology

## 2014-03-05 ENCOUNTER — Encounter (HOSPITAL_BASED_OUTPATIENT_CLINIC_OR_DEPARTMENT_OTHER): Payer: Medicaid Other

## 2014-03-05 VITALS — BP 110/68 | HR 75 | Temp 97.8°F | Resp 18

## 2014-03-05 DIAGNOSIS — C7931 Secondary malignant neoplasm of brain: Secondary | ICD-10-CM

## 2014-03-05 DIAGNOSIS — C787 Secondary malignant neoplasm of liver and intrahepatic bile duct: Secondary | ICD-10-CM

## 2014-03-05 DIAGNOSIS — C3490 Malignant neoplasm of unspecified part of unspecified bronchus or lung: Secondary | ICD-10-CM | POA: Insufficient documentation

## 2014-03-05 DIAGNOSIS — E039 Hypothyroidism, unspecified: Secondary | ICD-10-CM

## 2014-03-05 DIAGNOSIS — C801 Malignant (primary) neoplasm, unspecified: Secondary | ICD-10-CM

## 2014-03-05 DIAGNOSIS — E038 Other specified hypothyroidism: Secondary | ICD-10-CM

## 2014-03-05 DIAGNOSIS — E871 Hypo-osmolality and hyponatremia: Secondary | ICD-10-CM

## 2014-03-05 DIAGNOSIS — C7949 Secondary malignant neoplasm of other parts of nervous system: Secondary | ICD-10-CM

## 2014-03-05 LAB — COMPREHENSIVE METABOLIC PANEL
ALK PHOS: 366 U/L — AB (ref 39–117)
ALT: 58 U/L — AB (ref 0–35)
AST: 45 U/L — AB (ref 0–37)
Albumin: 2.6 g/dL — ABNORMAL LOW (ref 3.5–5.2)
BUN: 7 mg/dL (ref 6–23)
CALCIUM: 8.2 mg/dL — AB (ref 8.4–10.5)
CO2: 23 meq/L (ref 19–32)
Chloride: 91 mEq/L — ABNORMAL LOW (ref 96–112)
Creatinine, Ser: 0.25 mg/dL — ABNORMAL LOW (ref 0.50–1.10)
GFR calc Af Amer: >90 mL/min (ref >90)
GFR calc non Af Amer: >90 mL/min (ref >90)
GLUCOSE: 125 mg/dL — AB (ref 70–99)
POTASSIUM: 4.2 meq/L (ref 3.7–5.3)
Sodium: 128 mEq/L — ABNORMAL LOW (ref 137–147)
TOTAL PROTEIN: 6.5 g/dL (ref 6.0–8.3)
Total Bilirubin: 0.5 mg/dL (ref 0.3–1.2)

## 2014-03-05 LAB — CBC WITH DIFFERENTIAL/PLATELET
BASOS ABS: 0 10*3/uL (ref 0.0–0.1)
Basophils Relative: 0 % (ref 0–1)
Eosinophils Absolute: 0 10*3/uL (ref 0.0–0.7)
Eosinophils Relative: 0 % (ref 0–5)
HCT: 27.5 % — ABNORMAL LOW (ref 36.0–46.0)
HEMOGLOBIN: 9.6 g/dL — AB (ref 12.0–15.0)
LYMPHS PCT: 13 % (ref 12–46)
Lymphs Abs: 1.1 10*3/uL (ref 0.7–4.0)
MCH: 28.4 pg (ref 26.0–34.0)
MCHC: 34.9 g/dL (ref 30.0–36.0)
MCV: 81.4 fL (ref 78.0–100.0)
MONO ABS: 0.6 10*3/uL (ref 0.1–1.0)
Monocytes Relative: 8 % (ref 3–12)
NEUTROS ABS: 6.4 10*3/uL (ref 1.7–7.7)
Neutrophils Relative %: 79 % — ABNORMAL HIGH (ref 43–77)
Platelets: 105 10*3/uL — ABNORMAL LOW (ref 150–400)
RBC: 3.38 MIL/uL — ABNORMAL LOW (ref 3.87–5.11)
RDW: 16.8 % — AB (ref 11.5–15.5)
SMEAR REVIEW: DECREASED
WBC MORPHOLOGY: INCREASED
WBC: 8.1 10*3/uL (ref 4.0–10.5)

## 2014-03-05 LAB — TSH: TSH: 6.69 u[IU]/mL — ABNORMAL HIGH (ref 0.350–4.500)

## 2014-03-05 MED ORDER — SODIUM CHLORIDE 0.9 % IV SOLN: 3.0000 mg/kg | Freq: Once | INTRAVENOUS | Status: DC

## 2014-03-05 MED ORDER — SODIUM CHLORIDE 0.9 % IV SOLN: Freq: Once | INTRAVENOUS | Status: DC

## 2014-03-05 MED ORDER — SODIUM CHLORIDE 0.9 % IJ SOLN: 10.0000 mL | INTRAMUSCULAR | Status: DC | PRN

## 2014-03-05 MED ORDER — HEPARIN SOD (PORK) LOCK FLUSH 100 UNIT/ML IV SOLN: 500.0000 [IU] | Freq: Once | INTRAVENOUS | Status: DC | PRN

## 2014-03-05 MED ORDER — SODIUM CHLORIDE 0.9 % IJ SOLN
10.0000 mL | INTRAMUSCULAR | Status: DC | PRN
  Administered 2014-03-05: 10 mL via INTRAVENOUS

## 2014-03-05 MED ORDER — HEPARIN SOD (PORK) LOCK FLUSH 100 UNIT/ML IV SOLN
500.0000 [IU] | Freq: Once | INTRAVENOUS | Status: AC
  Administered 2014-03-05: 500 [IU] via INTRAVENOUS

## 2014-03-05 NOTE — Telephone Encounter (Signed)
Faxed rad onc ref to [facility]

## 2014-03-05 NOTE — Progress Notes (Signed)
Accessed port for lab work. Flushed port per protocol. Left port accessed for tomorrows chemo infusion. Chemo infusion not available from pharmacy today (medication ordered yesterday but did not come in [facility] shipment today) and pt had to be rescheduled for tomorrow.

## 2014-03-05 NOTE — Patient Instructions (Signed)
[facility] Discharge Instructions  RECOMMENDATIONS MADE BY THE CONSULTANT AND ANY TEST RESULTS WILL BE SENT TO YOUR REFERRING PHYSICIAN.  We will see you on Thursday 03/06/14 for your treatment.   Thank you for choosing [facility] to provide your oncology and hematology care.  To afford each patient quality time with our providers, please arrive at least 15 minutes before your scheduled appointment time.  With your help, our goal is to use those 15 minutes to complete the necessary work-up to ensure our physicians have the information they need to help with your evaluation and healthcare recommendations.    Effective January 1st, 2014, we ask that you re-schedule your appointment with our physicians should you arrive 10 or more minutes late for your appointment.  We strive to give you quality time with our providers, and arriving late affects you and other patients whose appointments are after yours.    Again, thank you for choosing [facility].  Our hope is that these requests will decrease the amount of time that you wait before being seen by our physicians.       _____________________________________________________________  Should you have questions after your visit to [facility], please contact our office at [phone] between the hours of 8:30 a.m. and 5:00 p.m.  Voicemails left after 4:30 p.m. will not be returned until the following business day.  For prescription refill requests, have your pharmacy contact our office with your prescription refill request.

## 2014-03-05 NOTE — Progress Notes (Signed)
[facility]  OFFICE PROGRESS NOTE  [place], MD [place] [address]  DIAGNOSIS: Lung cancer  Liver metastasis  Secondary malignant neoplasm of brain and spinal cord  Hypothyroidism  Hyponatremia secondary to hypothyroidism  Chief Complaint  Patient presents with  . Stage IV lung cancer    CURRENT THERAPY: Nivolumab every 2 weeks for cycle #3 today. Unfortunately NX did not drop should the drug today so she will be given tomorrow.  INTERVAL HISTORY: Debbie Larsen 63 y.o. 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 She denies any allergies you requirement. She also denies any significant headache, nausea, vomiting, or bloody diarrhea. She denies any cough, wheezing, shortness of breath, chest pain, PND, or right upper extremity lymphedema. She still remains a paralyzed with paraplegia.  MEDICAL HISTORY: Past Medical History  Diagnosis Date  . 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 Hypothyroid   . Enlarged lymph nodes   . 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  . S/P radiation therapy 12/26/01- 02/12/02     Right breast/regional lymph nodes/ 5040 cGy / 28 fractions/ bost of 1200 cGy/ 6 Fractions  . Status post chemotherapy     6 cycles of CMF completed on 12/12/2001  . Right shoulder pain     started late 2012  . Dry cough     intermittent  . SOB (shortness of breath)   . Use of letrozole (Femara) 03/14/2013  . Cholelithiasis   . Anemia   . BREAST CANCER 04/2001    s/p radiation therapy, chemotherapy and lumpectomy  . Metastasis to bone 03/13/13    PET scan results  . Metastasis to bone     breast primary  . Cancer     brain mets  . Lung cancer 10/08/2013  .  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 Stroke   . Hx of radiation therapy 12/25/13- 01/09/14    whole brain 3000 cGy in 10 fractions    INTERIM HISTORY: has BREAST CANCER, right, IDC, Stage II, recepto +; HYPOTHYROIDISM, UNSPECIFIED; Prediabetes; POSTMENOPAUSAL STATUS; Shoulder pain, right; Stroke; Vertigo following CVA (cerebrovascular accident); Vitamin D insufficiency; Osteopenia; Generalized anxiety disorder; Anemia; Metastasis to bone; Fever presenting with conditions classified elsewhere; Fever; Carcinoma of unknown origin; Lung cancer; History of breast cancer; Cancer; Secondary malignant neoplasm of brain and spinal cord; severe hyponatremia; Weakness; Elevated liver enzymes; Liver metastasis; Brain metastases; Bone metastasis; Protein-calorie malnutrition, severe; 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 ; DNR (do not resuscitate); Sepsis; UTI (lower urinary tract infection); Fever, unspecified; and Altered mental status on her problem list.   According to Dr. Jana Hakim (Hem/Onc): "The patient underwent a right breast core biopsy 03/29/2001 for an invasive ductal carcinoma associated with necrosis ([id number]). 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 The tumor was estrogen receptor 95% positive, progesterone receptor 96% positive, with an MIB-1 of 11%, and no HER-2 amplification by the HercepTest. She completed 6 cycles of CMF chemotherapy on 12/12/2001. There were minor delays, but no dose reductions. She received radiation to the right breast and regional  lymph nodes to a total of 5040 cGy with a right breast boost of 12 Gray, completed 02/12/2002. More recently, "Debbie Larsen" develop some right shoulder pain sometime in late 2012. She had followup mammograms of until 2011. There was no mammogram performed in 2012. She was  seen by physical therapy in orthopedic surgery for evaluation of her right shoulder pain. She had a mammogram on 07/24/2012 which showed a suspicious mass behind eczema of the right breast. Physical exam the time did show multiple involved right axillary lymph nodes. 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 HER-2 nonamplified the ratio by CISH being 1.43. Proliferative index was 50%. MRI scan of both breasts performed on 08/31/2012 showed multiple involved lymph nodes in the right axilla, no obvious masses in either breast. Unfortunately staging PET scan 09/12/2012 showed in addition to her right axillary recurrence, bilateral mediastinal and left lung nodules consistent with stage IV disease. Debbie Larsen was admitted thorugh the ED with progressive lower extremity weakness 01/13/2014. 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 Pulmonary Oncology and Radiation Oncology also have been consulted regarding further management-- please refer to Dr. Charlton Amor and Mohamed's notes"  ALLERGIES:  has No Known Allergies.  MEDICATIONS: has a current medication list which includes the following prescription(s): acetaminophen, alum & mag hydroxide-simeth, b-complex with vitamin c, calcium-vitamin d, diazepam, feeding supplement (resource breeze), gabapentin, hydrocodone-acetaminophen, hydrocortisone, levothyroxine, lidocaine-prilocaine, magnesium citrate, multivitamin with minerals, prochlorperazine, senna-docusate, and sodium phosphate, and the following Facility-Administered Medications: heparin lock flush, sodium chloride, and sodium chloride.  SURGICAL HISTORY:  Past Surgical History  Procedure Laterality Date  . Tonsillectomy    . Breast lumpectomy  04/2001    excision of L breast fibroadenoma by Dr.   . Breast lumpectomy  05/16/2001    Right axillary  sentinel lymph node biopsy.  . Portacath placement  09/19/2012    Procedure: INSERTION PORT-A-CATH;  Surgeon: Haywood Lasso, MD;  Location: [facility] OR;  Service: General;  Laterality: N/A;  . Lipoma excision Right 05/15/2013    Procedure: RIGHT AXILLARY LYMPH NODE DISSECTION;  Surgeon: Haywood Lasso, MD;  Location: [facility] ORS;  Service: General;  Laterality: Right;    FAMILY HISTORY: family history includes Diabetes in her mother; Hypertension in her father and sister; Hypothyroidism in her brother. There is no history of Cancer.  SOCIAL HISTORY:  reports that she has never smoked. She has never used smokeless tobacco. She reports that she does not drink alcohol or use illicit drugs.  REVIEW OF SYSTEMS:  Other than that discussed above is noncontributory.  PHYSICAL EXAMINATION: ECOG PERFORMANCE STATUS: 4 - Bedbound  Blood pressure 110/68, pulse 75, temperature 97.8 F (36.6 C), temperature source Oral, resp. rate 18, SpO2 99.00%.  GENERAL:alert, no distress and comfortable SKIN: skin color, texture, turgor are normal, no rashes or significant lesions EYES: PERLA; Conjunctiva are pink and non-injected, sclera clear SINUSES: No redness or tenderness over maxillary or ethmoid sinuses OROPHARYNX:no exudate, no erythema on lips, buccal mucosa, or tongue. NECK: supple, thyroid normal size, non-tender, without nodularity. No masses CHEST: Status post right breast surgery with no masses. Right axillary induration. LYMPH:  no palpable lymphadenopathy in the cervical, axillary or inguinal LUNGS: clear to auscultation and percussion with normal breathing effort HEART: regular rate & rhythm and no murmurs. ABDOMEN:abdomen soft, non-tender and normal bowel sounds MUSCULOSKELETAL:no cyanosis of digits and no clubbing. Range of motion  normal no lymphedema. NEURO: alert & oriented x 3 with fluent speech, no focal motor/sensory deficits. Lower 70 paraplegia.   LABORATORY DATA: Infusion on  03/05/2014  Component Date Value Ref Range Status  . WBC 03/05/2014 8.1  4.0 - 10.5 K/uL Final  . RBC 03/05/2014 3.38* 3.87 - 5.11 MIL/uL Final  . Hemoglobin 03/05/2014 9.6* 12.0 - 15.0 g/dL Final  . HCT 03/05/2014 27.5* 36.0 - 46.0 % Final  . MCV 03/05/2014 81.4  78.0 - 100.0 fL Final  . MCH 03/05/2014 28.4  26.0 - 34.0 pg Final  . MCHC 03/05/2014 34.9  30.0 - 36.0 g/dL Final  . RDW 03/05/2014 16.8* 11.5 - 15.5 % Final  . Platelets 03/05/2014 105* 150 - 400 K/uL Final   Comment: SPECIMEN CHECKED FOR CLOTS                          PLATELET COUNT CONFIRMED BY SMEAR  . Neutrophils Relative % 03/05/2014 79* 43 - 77 % Final  . Neutro Abs 03/05/2014 6.4  1.7 - 7.7 K/uL Final  . Lymphocytes Relative 03/05/2014 13  12 - 46 % Final  . Lymphs Abs 03/05/2014 1.1  0.7 - 4.0 K/uL Final  . Monocytes Relative 03/05/2014 8  3 - 12 % Final  . Monocytes Absolute 03/05/2014 0.6  0.1 - 1.0 K/uL Final  . Eosinophils Relative 03/05/2014 0  0 - 5 % Final  . Eosinophils Absolute 03/05/2014 0.0  0.0 - 0.7 K/uL Final  . Basophils Relative 03/05/2014 0  0 - 1 % Final  . Basophils Absolute 03/05/2014 0.0  0.0 - 0.1 K/uL Final  . WBC Morphology 03/05/2014 INCREASED BANDS (>20% BANDS)   Final  . RBC Morphology 03/05/2014 ROULEAUX   Final  . Smear Review 03/05/2014 PLATELETS APPEAR DECREASED   Corrected  . Sodium 03/05/2014 128* 137 - 147 mEq/L Final  . Potassium 03/05/2014 4.2  3.7 - 5.3 mEq/L Final  . Chloride 03/05/2014 91* 96 - 112 mEq/L Final  . CO2 03/05/2014 23  19 - 32 mEq/L Final  . Glucose, Bld 03/05/2014 125* 70 - 99 mg/dL Final  . BUN 03/05/2014 7  6 - 23 mg/dL Final  . Creatinine, Ser 03/05/2014 0.25* 0.50 - 1.10 mg/dL Final  . Calcium 03/05/2014 8.2* 8.4 - 10.5 mg/dL Final  . Total Protein 03/05/2014 6.5  6.0 - 8.3 g/dL Final  . Albumin 03/05/2014 2.6* 3.5 - 5.2 g/dL Final  . AST 03/05/2014 45* 0 - 37 U/L Final  . ALT 03/05/2014 58* 0 - 35 U/L Final  . Alkaline Phosphatase 03/05/2014 366* 39 -  117 U/L Final  . Total Bilirubin 03/05/2014 0.5  0.3 - 1.2 mg/dL Final  . GFR calc non Af Amer 03/05/2014 >90  >90 mL/min Final  . GFR calc Af Amer 03/05/2014 >90  >90 mL/min Final   Comment: (NOTE)                          The eGFR has been calculated using the CKD EPI equation.                          This calculation has not been validated in all clinical situations.                          eGFR's persistently <90 mL/min signify possible Chronic Kidney  Disease.  Marland Kitchen TSH 03/05/2014 6.690* 0.350 - 4.500 uIU/mL Final   Comment: Please note change in reference range.                          Performed at [facility]  Infusion on 02/27/2014  Component Date Value Ref Range Status  . WBC 02/27/2014 6.8  4.0 - 10.5 K/uL Final  . RBC 02/27/2014 3.58* 3.87 - 5.11 MIL/uL Final  . Hemoglobin 02/27/2014 10.0* 12.0 - 15.0 g/dL Final  . HCT 02/27/2014 29.1* 36.0 - 46.0 % Final  . MCV 02/27/2014 81.3  78.0 - 100.0 fL Final  . MCH 02/27/2014 27.9  26.0 - 34.0 pg Final  . MCHC 02/27/2014 34.4  30.0 - 36.0 g/dL Final  . RDW 02/27/2014 16.3* 11.5 - 15.5 % Final  . Platelets 02/27/2014 88* 150 - 400 K/uL Final   Comment: SPECIMEN CHECKED FOR CLOTS                          CONSISTENT WITH PREVIOUS RESULT  . Neutrophils Relative % 02/27/2014 80* 43 - 77 % Final  . Neutro Abs 02/27/2014 5.5  1.7 - 7.7 K/uL Final  . Lymphocytes Relative 02/27/2014 13  12 - 46 % Final  . Lymphs Abs 02/27/2014 0.9  0.7 - 4.0 K/uL Final  . Monocytes Relative 02/27/2014 7  3 - 12 % Final  . Monocytes Absolute 02/27/2014 0.5  0.1 - 1.0 K/uL Final  . Eosinophils Relative 02/27/2014 0  0 - 5 % Final  . Eosinophils Absolute 02/27/2014 0.0  0.0 - 0.7 K/uL Final  . Basophils Relative 02/27/2014 0  0 - 1 % Final  . Basophils Absolute 02/27/2014 0.0  0.0 - 0.1 K/uL Final  . Sodium 02/27/2014 126* 137 - 147 mEq/L Final  . Potassium 02/27/2014 4.1  3.7 - 5.3 mEq/L Final  . Chloride 02/27/2014 88* 96  - 112 mEq/L Final  . CO2 02/27/2014 26  19 - 32 mEq/L Final  . Glucose, Bld 02/27/2014 149* 70 - 99 mg/dL Final  . BUN 02/27/2014 9  6 - 23 mg/dL Final  . Creatinine, Ser 02/27/2014 0.24* 0.50 - 1.10 mg/dL Final  . Calcium 02/27/2014 8.7  8.4 - 10.5 mg/dL Final  . Total Protein 02/27/2014 6.5  6.0 - 8.3 g/dL Final  . Albumin 02/27/2014 2.7* 3.5 - 5.2 g/dL Final  . AST 02/27/2014 63* 0 - 37 U/L Final  . ALT 02/27/2014 77* 0 - 35 U/L Final  . Alkaline Phosphatase 02/27/2014 394* 39 - 117 U/L Final  . Total Bilirubin 02/27/2014 0.4  0.3 - 1.2 mg/dL Final  . GFR calc non Af Amer 02/27/2014 >90  >90 mL/min Final  . GFR calc Af Amer 02/27/2014 >90  >90 mL/min Final   Comment: (NOTE)                          The eGFR has been calculated using the CKD EPI equation.                          This calculation has not been validated in all clinical situations.                          eGFR's persistently <90 mL/min signify possible Chronic Kidney  Disease.  Marland Kitchen TSH 02/27/2014 12.440* 0.350 - 4.500 uIU/mL Final   Comment: Please note change in reference range.                          Performed at [facility]  Admission on 02/14/2014, Discharged on 02/20/2014  No results displayed because visit has over 200 results.    Infusion on 02/05/2014  Component Date Value Ref Range Status  . WBC 02/05/2014 13.2* 4.0 - 10.5 K/uL Final  . RBC 02/05/2014 3.64* 3.87 - 5.11 MIL/uL Final  . Hemoglobin 02/05/2014 9.9* 12.0 - 15.0 g/dL Final  . HCT 02/05/2014 29.8* 36.0 - 46.0 % Final  . MCV 02/05/2014 81.9  78.0 - 100.0 fL Final  . MCH 02/05/2014 27.2  26.0 - 34.0 pg Final  . MCHC 02/05/2014 33.2  30.0 - 36.0 g/dL Final  . RDW 02/05/2014 16.0* 11.5 - 15.5 % Final  . Platelets 02/05/2014 134* 150 - 400 K/uL Final  . Neutrophils Relative % 02/05/2014 84* 43 - 77 % Final  . Lymphocytes Relative 02/05/2014 11* 12 - 46 % Final  . Monocytes Relative 02/05/2014 5  3 - 12 % Final  .  Eosinophils Relative 02/05/2014 0  0 - 5 % Final  . Basophils Relative 02/05/2014 0  0 - 1 % Final  . Neutro Abs 02/05/2014 11.0* 1.7 - 7.7 K/uL Final  . Lymphs Abs 02/05/2014 1.5  0.7 - 4.0 K/uL Final  . Monocytes Absolute 02/05/2014 0.7  0.1 - 1.0 K/uL Final  . Eosinophils Absolute 02/05/2014 0.0  0.0 - 0.7 K/uL Final  . Basophils Absolute 02/05/2014 0.0  0.0 - 0.1 K/uL Final  . RBC Morphology 02/05/2014 POLYCHROMASIA PRESENT   Final  . WBC Morphology 02/05/2014 ATYPICAL LYMPHOCYTES   Final   MILD LEFT SHIFT (1-5% METAS, OCC MYELO, OCC BANDS)  . Sodium 02/05/2014 135* 137 - 147 mEq/L Final  . Potassium 02/05/2014 4.0  3.7 - 5.3 mEq/L Final  . Chloride 02/05/2014 93* 96 - 112 mEq/L Final  . CO2 02/05/2014 29  19 - 32 mEq/L Final  . Glucose, Bld 02/05/2014 144* 70 - 99 mg/dL Final  . BUN 02/05/2014 12  6 - 23 mg/dL Final  . Creatinine, Ser 02/05/2014 0.45* 0.50 - 1.10 mg/dL Final  . Calcium 02/05/2014 8.2* 8.4 - 10.5 mg/dL Final  . Total Protein 02/05/2014 6.4  6.0 - 8.3 g/dL Final  . Albumin 02/05/2014 2.6* 3.5 - 5.2 g/dL Final  . AST 02/05/2014 61* 0 - 37 U/L Final  . ALT 02/05/2014 96* 0 - 35 U/L Final  . Alkaline Phosphatase 02/05/2014 260* 39 - 117 U/L Final  . Total Bilirubin 02/05/2014 0.6  0.3 - 1.2 mg/dL Final  . GFR calc non Af Amer 02/05/2014 >90  >90 mL/min Final  . GFR calc Af Amer 02/05/2014 >90  >90 mL/min Final   Comment: (NOTE)                          The eGFR has been calculated using the CKD EPI equation.                          This calculation has not been validated in all clinical situations.                          eGFR's persistently <90  mL/min signify possible Chronic Kidney                          Disease.  Marland Kitchen TSH 02/05/2014 8.530* 0.350 - 4.500 uIU/mL Final   Comment: Please note change in reference range.                          Performed at [place]: Poorly differentiated carcinoma.  Urinalysis    Component Value  Date/Time   COLORURINE YELLOW 02/14/2014 2016   APPEARANCEUR HAZY* 02/14/2014 2016   LABSPEC <1.005* 02/14/2014 2016   LABSPEC 1.005 08/13/2013 1530   PHURINE 5.5 02/14/2014 2016   GLUCOSEU 100* 02/14/2014 2016   GLUCOSEU Negative 08/13/2013 1530   HGBUR TRACE* 02/14/2014 2016   HGBUR negative 04/09/2010 1512   [place] NEGATIVE 02/14/2014 2016   KETONESUR NEGATIVE 02/14/2014 2016   PROTEINUR NEGATIVE 02/14/2014 2016   UROBILINOGEN 0.2 02/14/2014 2016   UROBILINOGEN 0.2 08/13/2013 1530   NITRITE NEGATIVE 02/14/2014 2016   LEUKOCYTESUR SMALL* 02/14/2014 2016    RADIOGRAPHIC STUDIES: Dg Chest 2 View  02/14/2014   CLINICAL DATA:  Shortness of breath, metastatic breast cancer.  EXAM: CHEST  2 VIEW  COMPARISON:  DG CHEST 1V PORT dated 01/29/2014; CT CHEST W/CM dated 01/14/2014; CT CHEST W/CM dated 07/03/2013  FINDINGS: Trachea is midline. Left subclavian power port tip is in the SVC. Heart size stable. Pulmonary parenchymal lesions seen on 01/14/2014 are poorly appreciated. No pleural fluid. Surgical clips in the right axilla.  IMPRESSION: Pulmonary parenchymal lesions seen on 01/14/2014 are poorly appreciated on the current exam. No large areas of airspace consolidation. No pleural fluid.   Electronically Signed   By: Lorin Picket M.D.   On: 02/14/2014 20:29   Ct Angio Chest Pe W/cm &/or Wo Cm  02/15/2014   CLINICAL DATA:  Breast cancer. Status post lumpectomy, chemotherapy and radiation. Metastases to lung, bone and brain. Tachycardia, shortness of breath.  EXAM: CT ANGIOGRAPHY CHEST WITH CONTRAST  TECHNIQUE: Multidetector CT imaging of the chest was performed using the standard protocol during bolus administration of intravenous contrast. Multiplanar CT image reconstructions and MIPs were obtained to evaluate the vascular anatomy.  CONTRAST:  195m OMNIPAQUE IOHEXOL 350 MG/ML SOLN  COMPARISON:  01/14/2014  FINDINGS: Left-sided Port-A-Cath is in place with the tip in the SVC. Heart is borderline in size.  There is a small pericardial effusion. Aorta is normal caliber.  6 mm prevascular lymph node on image 25. Abnormal soft tissue in the right paratracheal region is difficult to measure as this appears to be rather diffuse and ill-defined. This has increased since prior study.  Right lower lobe pulmonary nodule on image 49 measures 7 mm, not significantly changed. Nodular area in the left upper lobe in the perihilar region on image 36 measures 10 mm compared with 9 mm previously, not significantly changed. Areas of peripheral ground-glass airspace opacity noted peripherally in the upper lobes bilaterally, increased since prior study. There are numerous small somewhat ill-defined lower lobe pulmonary nodular areas, many of which are new since prior study. These are small, 3-4 mm or less in size. .Marland Kitchen Postsurgical changes in the right breast and axilla.  Diffuse sclerotic lesions throughout the thoracic spine compatible with metastases. Multiple bilateral rib lesions.  Multiple rim enhancing lesions within the liver. 2.4 cm lesion within the right hepatic lobe compared with 1.8 cm previously. 1.8 cm  lesion in the left hepatic lobe on image 69 compared with 8 mm previously. The other lesions in the dome with the liver are not as well visualized on this Chest CT due to bolus timing.  Review of the MIP images confirms the above findings.  IMPRESSION: New patchy ground-glass airspace opacities within the upper lobes bilaterally peripherally. There are also numerous small ill-defined subcentimeric pulmonary nodules in the lower lobes. Previously seen nodular densities in the lungs are relatively stable. Overall process in the lungs could reflect lymphangitic spread of tumor, atypical infections or drug toxicity. Pulmonary nodularity scratch head the pulmonary nodules are concerning for metastases.  Small pericardial effusion.  Ill-defined abnormal soft tissue in the mediastinum, predominantly in the paratracheal region,  difficult to measure due to its indistinct appearance.  Slight enlargement of at least 2 hepatic metastases.  Stable diffuse bony metastases.   Electronically Signed   By: Rolm Baptise M.D.   On: 02/15/2014 20:09    ASSESSMENT:  #1. Poorly differentiated carcinoma with metastases to lungs, bone, and spinal cord with paraplegia for cycle #3 of Nivolumab which will be held until tomorrow when the drug gets drop-shipped. #2. Hyponatremia secondary to hypothyroidism caused by Nivolumab. #3. Hypothyroidism secondary to Nivolumab. #4. Patient is currently a full code per her request.   PLAN:  #1. CBC, chem profile, and TSH today. #2. Nivolumab intravenously tomorrow when it is available. #3. Followup in 2 weeks for continuation of treatment. Synthroid dose will be altered based upon today's TSH result.   All questions were answered. The patient knows to call the clinic with any problems, questions or concerns. We can certainly see the patient much sooner if necessary.   I spent 25 minutes counseling the patient face to face. The total time spent in the appointment was 30 minutes.    Farrel Gobble, MD 03/06/2014 6:33 AM  DISCLAIMER:  This note was dictated with voice recognition software.  Similar sounding words can inadvertently be transcribed inaccurately and may not be corrected upon review.

## 2014-03-06 ENCOUNTER — Other Ambulatory Visit (HOSPITAL_COMMUNITY): Payer: Self-pay | Admitting: Hematology and Oncology

## 2014-03-06 ENCOUNTER — Telehealth (HOSPITAL_COMMUNITY): Payer: Self-pay

## 2014-03-06 ENCOUNTER — Encounter (HOSPITAL_BASED_OUTPATIENT_CLINIC_OR_DEPARTMENT_OTHER): Payer: Medicaid Other

## 2014-03-06 VITALS — BP 105/69 | HR 99 | Temp 99.4°F | Resp 18

## 2014-03-06 DIAGNOSIS — C3490 Malignant neoplasm of unspecified part of unspecified bronchus or lung: Secondary | ICD-10-CM

## 2014-03-06 DIAGNOSIS — C7931 Secondary malignant neoplasm of brain: Secondary | ICD-10-CM

## 2014-03-06 DIAGNOSIS — C787 Secondary malignant neoplasm of liver and intrahepatic bile duct: Secondary | ICD-10-CM

## 2014-03-06 DIAGNOSIS — Z5112 Encounter for antineoplastic immunotherapy: Secondary | ICD-10-CM

## 2014-03-06 DIAGNOSIS — C7949 Secondary malignant neoplasm of other parts of nervous system: Secondary | ICD-10-CM

## 2014-03-06 MED ORDER — SODIUM CHLORIDE 0.9 % IJ SOLN
10.0000 mL | INTRAMUSCULAR | Status: DC | PRN
  Administered 2014-03-06: 10 mL via INTRAVENOUS

## 2014-03-06 MED ORDER — HEPARIN SOD (PORK) LOCK FLUSH 100 UNIT/ML IV SOLN
500.0000 [IU] | Freq: Once | INTRAVENOUS | Status: AC
  Administered 2014-03-06: 500 [IU] via INTRAVENOUS
  Filled 2014-03-06: qty 5

## 2014-03-06 MED ORDER — SODIUM CHLORIDE 0.9 % IV SOLN
INTRAVENOUS | Status: DC
  Administered 2014-03-06: 11:00:00 via INTRAVENOUS

## 2014-03-06 MED ORDER — SODIUM CHLORIDE 0.9 % IV SOLN
3.0000 mg/kg | Freq: Once | INTRAVENOUS | Status: AC
  Administered 2014-03-06: 140 mg via INTRAVENOUS
  Filled 2014-03-06: qty 10

## 2014-03-06 NOTE — Patient Instructions (Addendum)
[facility] Discharge Instructions for Patients Receiving Chemotherapy  Today you received the following chemotherapy agents opdivo  Zometa next week   If you develop nausea and vomiting that is not controlled by your nausea medication, call the clinic. If it is after clinic hours your family physician or the after hours number for the clinic or go to the Emergency Department.   BELOW ARE SYMPTOMS THAT SHOULD BE REPORTED IMMEDIATELY:  *FEVER GREATER THAN 101.0 F  *CHILLS WITH OR WITHOUT FEVER  NAUSEA AND VOMITING THAT IS NOT CONTROLLED WITH YOUR NAUSEA MEDICATION  *UNUSUAL SHORTNESS OF BREATH  *UNUSUAL BRUISING OR BLEEDING  TENDERNESS IN MOUTH AND THROAT WITH OR WITHOUT PRESENCE OF ULCERS  *URINARY PROBLEMS  *BOWEL PROBLEMS  UNUSUAL RASH Items with * indicate a potential emergency and should be followed up as soon as possible.

## 2014-03-06 NOTE — Telephone Encounter (Signed)
Orders to increase levothyroxine to 200 mcg daily called and faxed to [facility] @ [facility].

## 2014-03-07 ENCOUNTER — Telehealth: Payer: Self-pay | Admitting: Dietician

## 2014-03-09 ENCOUNTER — Encounter (HOSPITAL_COMMUNITY): Payer: Self-pay | Admitting: Emergency Medicine

## 2014-03-09 ENCOUNTER — Inpatient Hospital Stay (HOSPITAL_COMMUNITY)
Admission: EM | Admit: 2014-03-09 | Discharge: 2014-03-31 | DRG: 640 | Disposition: E | Payer: Medicaid Other | Attending: Internal Medicine | Admitting: Internal Medicine

## 2014-03-09 ENCOUNTER — Emergency Department (HOSPITAL_COMMUNITY): Payer: Medicaid Other

## 2014-03-09 DIAGNOSIS — D649 Anemia, unspecified: Secondary | ICD-10-CM | POA: Diagnosis present

## 2014-03-09 DIAGNOSIS — E876 Hypokalemia: Secondary | ICD-10-CM | POA: Diagnosis present

## 2014-03-09 DIAGNOSIS — Z681 Body mass index (BMI) 19 or less, adult: Secondary | ICD-10-CM

## 2014-03-09 DIAGNOSIS — Z66 Do not resuscitate: Secondary | ICD-10-CM | POA: Diagnosis present

## 2014-03-09 DIAGNOSIS — G8220 Paraplegia, unspecified: Secondary | ICD-10-CM | POA: Diagnosis present

## 2014-03-09 DIAGNOSIS — R1319 Other dysphagia: Secondary | ICD-10-CM | POA: Diagnosis present

## 2014-03-09 DIAGNOSIS — R531 Weakness: Secondary | ICD-10-CM | POA: Diagnosis present

## 2014-03-09 DIAGNOSIS — R627 Adult failure to thrive: Secondary | ICD-10-CM | POA: Diagnosis present

## 2014-03-09 DIAGNOSIS — C7951 Secondary malignant neoplasm of bone: Secondary | ICD-10-CM | POA: Diagnosis present

## 2014-03-09 DIAGNOSIS — Z833 Family history of diabetes mellitus: Secondary | ICD-10-CM

## 2014-03-09 DIAGNOSIS — E039 Hypothyroidism, unspecified: Secondary | ICD-10-CM | POA: Diagnosis present

## 2014-03-09 DIAGNOSIS — C7952 Secondary malignant neoplasm of bone marrow: Secondary | ICD-10-CM

## 2014-03-09 DIAGNOSIS — C50919 Malignant neoplasm of unspecified site of unspecified female breast: Secondary | ICD-10-CM | POA: Diagnosis present

## 2014-03-09 DIAGNOSIS — G9341 Metabolic encephalopathy: Secondary | ICD-10-CM | POA: Diagnosis present

## 2014-03-09 DIAGNOSIS — Z79899 Other long term (current) drug therapy: Secondary | ICD-10-CM

## 2014-03-09 DIAGNOSIS — C7931 Secondary malignant neoplasm of brain: Secondary | ICD-10-CM | POA: Diagnosis present

## 2014-03-09 DIAGNOSIS — D6959 Other secondary thrombocytopenia: Secondary | ICD-10-CM | POA: Diagnosis present

## 2014-03-09 DIAGNOSIS — I498 Other specified cardiac arrhythmias: Secondary | ICD-10-CM | POA: Diagnosis present

## 2014-03-09 DIAGNOSIS — R1310 Dysphagia, unspecified: Secondary | ICD-10-CM | POA: Diagnosis present

## 2014-03-09 DIAGNOSIS — Z8673 Personal history of transient ischemic attack (TIA), and cerebral infarction without residual deficits: Secondary | ICD-10-CM

## 2014-03-09 DIAGNOSIS — C7800 Secondary malignant neoplasm of unspecified lung: Secondary | ICD-10-CM | POA: Diagnosis present

## 2014-03-09 DIAGNOSIS — E41 Nutritional marasmus: Secondary | ICD-10-CM | POA: Diagnosis present

## 2014-03-09 DIAGNOSIS — R4182 Altered mental status, unspecified: Secondary | ICD-10-CM

## 2014-03-09 DIAGNOSIS — E871 Hypo-osmolality and hyponatremia: Principal | ICD-10-CM

## 2014-03-09 DIAGNOSIS — C7949 Secondary malignant neoplasm of other parts of nervous system: Secondary | ICD-10-CM

## 2014-03-09 DIAGNOSIS — Z9221 Personal history of antineoplastic chemotherapy: Secondary | ICD-10-CM

## 2014-03-09 DIAGNOSIS — Z8249 Family history of ischemic heart disease and other diseases of the circulatory system: Secondary | ICD-10-CM

## 2014-03-09 DIAGNOSIS — Z923 Personal history of irradiation: Secondary | ICD-10-CM

## 2014-03-09 LAB — CBC WITH DIFFERENTIAL/PLATELET
BASOS PCT: 0 % (ref 0–1)
Basophils Absolute: 0 10*3/uL (ref 0.0–0.1)
EOS PCT: 0 % (ref 0–5)
Eosinophils Absolute: 0 10*3/uL (ref 0.0–0.7)
HCT: 26.1 % — ABNORMAL LOW (ref 36.0–46.0)
HEMOGLOBIN: 9.1 g/dL — AB (ref 12.0–15.0)
Lymphocytes Relative: 14 % (ref 12–46)
Lymphs Abs: 1.1 10*3/uL (ref 0.7–4.0)
MCH: 27.5 pg (ref 26.0–34.0)
MCHC: 34.9 g/dL (ref 30.0–36.0)
MCV: 78.9 fL (ref 78.0–100.0)
Monocytes Absolute: 0.5 10*3/uL (ref 0.1–1.0)
Monocytes Relative: 6 % (ref 3–12)
NEUTROS PCT: 80 % — AB (ref 43–77)
Neutro Abs: 6.6 10*3/uL (ref 1.7–7.7)
Platelets: 86 10*3/uL — ABNORMAL LOW (ref 150–400)
RBC: 3.31 MIL/uL — AB (ref 3.87–5.11)
RDW: 16.2 % — ABNORMAL HIGH (ref 11.5–15.5)
Smear Review: DECREASED
WBC: 8.2 10*3/uL (ref 4.0–10.5)

## 2014-03-09 LAB — MRSA PCR SCREENING: MRSA BY PCR: POSITIVE — AB

## 2014-03-09 LAB — COMPREHENSIVE METABOLIC PANEL
ALBUMIN: 2.7 g/dL — AB (ref 3.5–5.2)
ALT: 75 U/L — ABNORMAL HIGH (ref 0–35)
AST: 59 U/L — AB (ref 0–37)
Alkaline Phosphatase: 398 U/L — ABNORMAL HIGH (ref 39–117)
BILIRUBIN TOTAL: 0.7 mg/dL (ref 0.3–1.2)
BUN: 6 mg/dL (ref 6–23)
CHLORIDE: 78 meq/L — AB (ref 96–112)
CO2: 27 mEq/L (ref 19–32)
Calcium: 8.6 mg/dL (ref 8.4–10.5)
Glucose, Bld: 216 mg/dL — ABNORMAL HIGH (ref 70–99)
Potassium: 3.3 mEq/L — ABNORMAL LOW (ref 3.7–5.3)
Sodium: 116 mEq/L — CL (ref 137–147)
TOTAL PROTEIN: 6.7 g/dL (ref 6.0–8.3)

## 2014-03-09 LAB — BASIC METABOLIC PANEL
BUN: 4 mg/dL — AB (ref 6–23)
BUN: 6 mg/dL (ref 6–23)
CALCIUM: 8.1 mg/dL — AB (ref 8.4–10.5)
CO2: 20 mEq/L (ref 19–32)
CO2: 31 mEq/L (ref 19–32)
Calcium: 4.8 mg/dL — CL (ref 8.4–10.5)
Chloride: 101 mEq/L (ref 96–112)
Chloride: 81 mEq/L — ABNORMAL LOW (ref 96–112)
Creatinine, Ser: <0.2 mg/dL — ABNORMAL LOW (ref 0.50–1.10)
GLUCOSE: 128 mg/dL — AB (ref 70–99)
Glucose, Bld: 210 mg/dL — ABNORMAL HIGH (ref 70–99)
Potassium: 2.2 mEq/L — CL (ref 3.7–5.3)
Potassium: 2.9 mEq/L — CL (ref 3.7–5.3)
Sodium: 130 mEq/L — ABNORMAL LOW (ref 137–147)
Sodium: 119 mEq/L — CL (ref 137–147)

## 2014-03-09 LAB — PRO B NATRIURETIC PEPTIDE: Pro B Natriuretic peptide (BNP): 451.8 pg/mL — ABNORMAL HIGH (ref 0–125)

## 2014-03-09 LAB — MAGNESIUM: Magnesium: 1.9 mg/dL (ref 1.5–2.5)

## 2014-03-09 MED ORDER — HYDROMORPHONE HCL PF 1 MG/ML IJ SOLN: 1.0000 mg | INTRAMUSCULAR | Status: DC | PRN

## 2014-03-09 MED ORDER — ALUM & MAG HYDROXIDE-SIMETH 200-200-20 MG/5ML PO SUSP: 30.0000 mL | Freq: Four times a day (QID) | ORAL | Status: DC | PRN

## 2014-03-09 MED ORDER — FLUCONAZOLE IN SODIUM CHLORIDE 200-0.9 MG/100ML-% IV SOLN
INTRAVENOUS | Status: AC
  Filled 2014-03-09: qty 100

## 2014-03-09 MED ORDER — ENOXAPARIN SODIUM 40 MG/0.4ML ~~LOC~~ SOLN: 40.0000 mg | SUBCUTANEOUS | Status: DC

## 2014-03-09 MED ORDER — LORAZEPAM 2 MG/ML IJ SOLN
1.0000 mg | Freq: Four times a day (QID) | INTRAMUSCULAR | Status: DC | PRN
  Administered 2014-03-09: 1 mg via INTRAVENOUS
  Filled 2014-03-09: qty 1

## 2014-03-09 MED ORDER — ALBUTEROL SULFATE (2.5 MG/3ML) 0.083% IN NEBU
2.5000 mg | INHALATION_SOLUTION | Freq: Four times a day (QID) | RESPIRATORY_TRACT | Status: DC
  Administered 2014-03-09: 2.5 mg via RESPIRATORY_TRACT
  Filled 2014-03-09: qty 3

## 2014-03-09 MED ORDER — FLEET ENEMA 7-19 GM/118ML RE ENEM: 1.0000 | ENEMA | Freq: Every day | RECTAL | Status: DC | PRN

## 2014-03-09 MED ORDER — ACETAMINOPHEN 325 MG PO TABS: 650.0000 mg | ORAL_TABLET | Freq: Four times a day (QID) | ORAL | Status: DC | PRN

## 2014-03-09 MED ORDER — ONDANSETRON HCL 4 MG/2ML IJ SOLN: 4.0000 mg | Freq: Four times a day (QID) | INTRAMUSCULAR | Status: DC | PRN

## 2014-03-09 MED ORDER — MAGIC MOUTHWASH W/LIDOCAINE
5.0000 mL | Freq: Four times a day (QID) | ORAL | Status: DC
  Filled 2014-03-09 (×4): qty 5

## 2014-03-09 MED ORDER — ALBUTEROL SULFATE (2.5 MG/3ML) 0.083% IN NEBU: 2.5000 mg | INHALATION_SOLUTION | RESPIRATORY_TRACT | Status: DC | PRN

## 2014-03-09 MED ORDER — FLUCONAZOLE 100MG IVPB
100.0000 mg | INTRAVENOUS | Status: DC
  Administered 2014-03-09: 100 mg via INTRAVENOUS
  Filled 2014-03-09: qty 50

## 2014-03-09 MED ORDER — ACETAMINOPHEN 650 MG RE SUPP: 650.0000 mg | Freq: Four times a day (QID) | RECTAL | Status: DC | PRN

## 2014-03-09 MED ORDER — ONDANSETRON HCL 4 MG PO TABS: 4.0000 mg | ORAL_TABLET | Freq: Four times a day (QID) | ORAL | Status: DC | PRN

## 2014-03-09 MED ORDER — DIAZEPAM 5 MG/ML PO CONC: 5.0000 mg | Freq: Four times a day (QID) | ORAL | Status: DC | PRN

## 2014-03-09 MED ORDER — SENNOSIDES-DOCUSATE SODIUM 8.6-50 MG PO TABS: 2.0000 | ORAL_TABLET | Freq: Every evening | ORAL | Status: DC | PRN

## 2014-03-09 MED ORDER — PROCHLORPERAZINE MALEATE 5 MG PO TABS: 10.0000 mg | ORAL_TABLET | Freq: Four times a day (QID) | ORAL | Status: DC | PRN

## 2014-03-09 MED ORDER — MAGNESIUM CITRATE PO SOLN: 1.0000 | Freq: Every day | ORAL | Status: DC | PRN

## 2014-03-09 MED ORDER — HYDROCODONE-ACETAMINOPHEN 5-325 MG PO TABS: 1.0000 | ORAL_TABLET | ORAL | Status: DC | PRN

## 2014-03-09 MED ORDER — BOOST / RESOURCE BREEZE PO LIQD: 1.0000 | Freq: Three times a day (TID) | ORAL | Status: DC

## 2014-03-09 MED ORDER — GABAPENTIN 100 MG PO CAPS: 100.0000 mg | ORAL_CAPSULE | Freq: Two times a day (BID) | ORAL | Status: DC

## 2014-03-09 MED ORDER — KCL IN DEXTROSE-NACL 40-5-0.45 MEQ/L-%-% IV SOLN
INTRAVENOUS | Status: DC
  Administered 2014-03-09: 21:00:00 via INTRAVENOUS

## 2014-03-09 MED ORDER — HYDROCORTISONE NA SUCCINATE PF 100 MG IJ SOLR
50.0000 mg | Freq: Every day | INTRAMUSCULAR | Status: DC
  Administered 2014-03-09: 50 mg via INTRAVENOUS
  Filled 2014-03-09: qty 2

## 2014-03-09 MED ORDER — SODIUM CHLORIDE 0.9 % IV SOLN: INTRAVENOUS | Status: DC

## 2014-03-09 MED ORDER — LIDOCAINE VISCOUS 2 % MT SOLN
15.0000 mL | OROMUCOSAL | Status: DC
  Filled 2014-03-09 (×5): qty 15

## 2014-03-09 MED ORDER — BIOTENE DRY MOUTH MT LIQD
15.0000 mL | Freq: Two times a day (BID) | OROMUCOSAL | Status: DC
  Administered 2014-03-09: 15 mL via OROMUCOSAL

## 2014-03-09 MED ORDER — HYDROCORTISONE 20 MG PO TABS
20.0000 mg | ORAL_TABLET | Freq: Two times a day (BID) | ORAL | Status: DC
  Filled 2014-03-09 (×2): qty 1

## 2014-03-09 MED ORDER — LEVOTHYROXINE SODIUM 100 MCG PO TABS: 200.0000 ug | ORAL_TABLET | Freq: Every day | ORAL | Status: DC

## 2014-03-09 MED ORDER — B COMPLEX-C PO TABS
1.0000 | ORAL_TABLET | Freq: Every day | ORAL | Status: DC
  Filled 2014-03-09: qty 1

## 2014-03-09 MED ORDER — MAGIC MOUTHWASH: 5.0000 mL | ORAL | Status: DC

## 2014-03-09 MED ORDER — SODIUM CHLORIDE 0.9 % IV SOLN
Freq: Once | INTRAVENOUS | Status: AC
  Administered 2014-03-09: 17:00:00 via INTRAVENOUS

## 2014-03-09 MED ORDER — POTASSIUM CHLORIDE CRYS ER 20 MEQ PO TBCR: 40.0000 meq | EXTENDED_RELEASE_TABLET | ORAL | Status: AC

## 2014-03-09 MED ORDER — ADULT MULTIVITAMIN W/MINERALS CH: 1.0000 | ORAL_TABLET | Freq: Every day | ORAL | Status: DC

## 2014-03-09 NOTE — ED Provider Notes (Signed)
CSN: [id number]     Arrival date & time 03/24/2014  1531 History   First MD Initiated Contact with Patient 03/22/2014 1540     Chief Complaint  Patient presents with  . low sodium      (Consider location/radiation/quality/duration/timing/severity/associated sxs/prior Treatment) Patient is a 63 y.o. female presenting with shortness of breath. The history is provided by the patient (pt has been sob.  also her na has been low).  Shortness of Breath Severity:  Moderate Onset quality:  Gradual Timing:  Constant Progression:  Waxing and waning Chronicity:  Chronic Context: activity   Relieved by:  None tried Associated symptoms: no abdominal pain, no chest pain, no cough, no headaches and no rash     Past Medical History  Diagnosis Date  . 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 Hypothyroid   . Enlarged lymph nodes   . 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  . S/P radiation therapy 12/26/01- 02/12/02     Right breast/regional lymph nodes/ 5040 cGy / 28 fractions/ bost of 1200 cGy/ 6 Fractions  . Status post chemotherapy     6 cycles of CMF completed on 12/12/2001  . Right shoulder pain     started late 2012  . Dry cough     intermittent  . SOB (shortness of breath)   . Use of letrozole (Femara) 03/14/2013  . Cholelithiasis   . Anemia   . BREAST CANCER 04/2001    s/p radiation therapy, chemotherapy and lumpectomy  . Metastasis to bone 03/13/13    PET scan results  . Metastasis to bone     breast primary  . Cancer     brain mets  . Lung cancer 10/08/2013  . 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 Stroke   . Hx of radiation therapy 12/25/13- 01/09/14    whole brain 3000 cGy in 10 fractions   Past Surgical History  Procedure Laterality Date  . Tonsillectomy    . Breast lumpectomy  04/2001    excision of L breast fibroadenoma by Dr.   . Breast lumpectomy  05/16/2001    Right  axillary sentinel lymph node biopsy.  . Portacath placement  09/19/2012    Procedure: INSERTION PORT-A-CATH;  Surgeon: Haywood Lasso, MD;  Location: [place];  Service: General;  Laterality: N/A;  . Lipoma excision Right 05/15/2013    Procedure: RIGHT AXILLARY LYMPH NODE DISSECTION;  Surgeon: Haywood Lasso, MD;  Location: [facility];  Service: General;  Laterality: Right;   Family History  Problem Relation Age of Onset  . Diabetes Mother   . Hypertension Father   . Hypertension Sister   . Hypothyroidism Brother   . Cancer Neg Hx    History  Substance Use Topics  . Smoking status: Never Smoker   . Smokeless tobacco: Never Used  . Alcohol Use: No   OB History   Grav Para Term Preterm Abortions TAB SAB Ect Mult Living   1 1 1       1      Obstetric Comments   G1, P1 Parity 28, Menopause with chemotherapy. No HRT use     Review of Systems  Constitutional: Negative for appetite change and fatigue.  HENT: Negative for congestion, ear discharge and sinus pressure.   Eyes: Negative for discharge.  Respiratory: Positive for shortness of breath. Negative for cough.   Cardiovascular: Negative for chest  pain.  Gastrointestinal: Negative for abdominal pain and diarrhea.  Genitourinary: Negative for frequency and hematuria.  Musculoskeletal: Negative for back pain.  Skin: Negative for rash.  Neurological: Negative for seizures and headaches.  Psychiatric/Behavioral: Negative for hallucinations.      Allergies  Review of patient's allergies indicates no known allergies.  Home Medications   Prior to Admission medications   Medication Sig Start Date End Date Taking? Authorizing Provider  acetaminophen (TYLENOL) 325 MG tablet Take 2 tablets (650 mg total) by mouth every 6 (six) hours as needed for moderate pain, fever or headache. 01/28/14   Venetia Maxon Rama, MD  alum & mag hydroxide-simeth (MAALOX/MYLANTA) 200-200-20 MG/5ML suspension Take 15 mLs by mouth every 4 (four) hours as  needed for indigestion or heartburn. 01/28/14   Venetia Maxon Rama, MD  B Complex-C (B-COMPLEX WITH VITAMIN C) tablet Take 1 tablet by mouth daily.    Historical Provider, MD  calcium-vitamin D (OSCAL WITH D) 500-200 MG-UNIT per tablet Take 2 tablets by mouth daily with breakfast. 02/14/14   Baird Cancer, PA-C  diazepam (DIAZEPAM INTENSOL) 5 MG/ML solution Give 53ml by mouth every 6 hours as needed for anxiety or sleep 02/21/14   Blanchie Serve, MD  feeding supplement, RESOURCE BREEZE, (RESOURCE BREEZE) LIQD Take 1 Container by mouth 3 (three) times daily between meals. 01/28/14   Venetia Maxon Rama, MD  gabapentin (NEURONTIN) 100 MG capsule Take 1 capsule (100 mg total) by mouth 2 (two) times daily. 01/28/14   Venetia Maxon Rama, MD  HYDROcodone-acetaminophen (NORCO/VICODIN) 5-325 MG per tablet Take 1 tablet by mouth every 4 (four) hours as needed for moderate pain. 02/21/14   Blanchie Serve, MD  hydrocortisone (CORTEF) 10 MG tablet Take 2 tablets (20 mg total) by mouth 2 (two) times daily. 02/03/14   Theodis Blaze, MD  levothyroxine (SYNTHROID, LEVOTHROID) 100 MCG tablet Take 1 tablet (100 mcg total) by mouth daily before breakfast. 02/20/14   Erline Hau, MD  lidocaine-prilocaine (EMLA) cream Apply topically as needed. Apply to port 1-2 hours before procedure 09/18/12   Eston Esters, MD  magnesium citrate SOLN Take 296 mLs (1 Bottle total) by mouth daily as needed for severe constipation. 01/28/14   Venetia Maxon Rama, MD  Multiple Vitamin (MULTIVITAMIN WITH MINERALS) TABS Take 1 tablet by mouth daily.    Historical Provider, MD  prochlorperazine (COMPAZINE) 10 MG tablet Take 1 tablet (10 mg total) by mouth every 6 (six) hours as needed for nausea or vomiting. 02/03/14   Theodis Blaze, MD  senna-docusate (SENOKOT-S) 8.6-50 MG per tablet Take 2 tablets by mouth at bedtime as needed for mild constipation. 01/28/14   Venetia Maxon Rama, MD  sodium phosphate (FLEET) 7-19 GM/118ML ENEM Place 1 enema rectally  daily as needed for severe constipation. 01/28/14   Christina P Rama, MD   BP 133/91  Pulse 87  Temp(Src) 97.9 F (36.6 C) (Oral)  Resp 34  SpO2 88% Physical Exam  Constitutional: She is oriented to person, place, and time. She appears well-developed.  HENT:  Head: Normocephalic.  Eyes: Conjunctivae and EOM are normal. No scleral icterus.  Neck: Neck supple. No thyromegaly present.  Cardiovascular: Normal rate and regular rhythm.  Exam reveals no gallop and no friction rub.   No murmur heard. Pulmonary/Chest: No stridor. She has no wheezes. She has rales. She exhibits no tenderness.  Abdominal: She exhibits no distension. There is no tenderness. There is no rebound.  Musculoskeletal: Normal range of motion. She exhibits  no edema.  Lymphadenopathy:    She has no cervical adenopathy.  Neurological: She is oriented to person, place, and time. She exhibits normal muscle tone. Coordination normal.  Skin: No rash noted. No erythema.  Psychiatric: She has a normal mood and affect. Her behavior is normal.    ED Course  Procedures (including critical care time) Labs Review Labs Reviewed  CBC WITH DIFFERENTIAL - Abnormal; Notable for the following:    RBC 3.31 (*)    Hemoglobin 9.1 (*)    HCT 26.1 (*)    RDW 16.2 (*)    Platelets 86 (*)    Neutrophils Relative % 80 (*)    All other components within normal limits  COMPREHENSIVE METABOLIC PANEL - Abnormal; Notable for the following:    Sodium 116 (*)    Potassium 3.3 (*)    Chloride 78 (*)    Glucose, Bld 216 (*)    Creatinine, Ser <0.20 (*)    Albumin 2.7 (*)    AST 59 (*)    ALT 75 (*)    Alkaline Phosphatase 398 (*)    All other components within normal limits  PRO B NATRIURETIC PEPTIDE - Abnormal; Notable for the following:    Pro B Natriuretic peptide (BNP) 451.8 (*)    All other components within normal limits    Imaging Review Dg Chest Portable 1 View  03/14/2014   CLINICAL DATA:  Shortness of breath.  EXAM:  PORTABLE CHEST - 1 VIEW  COMPARISON:  02/14/2014  FINDINGS: Left subclavian Port-A-Cath is unchanged. Lungs are adequately inflated and demonstrate moderate prominence of the perihilar markings bilaterally right slightly worse than left likely interstitial edema, although cannot exclude infection. No evidence of effusion. Cardiomediastinal silhouette and remainder the exam is unchanged. Marland Kitchen  IMPRESSION: Mild prominence of the perihilar vasculature bilaterally right worse than left likely asymmetric edema, although cannot exclude infection.   Electronically Signed   By: Marin Olp M.D.   On: 03/25/2014 16:02     EKG Interpretation None     CRITICAL CARE Performed by: Maudry Diego Total critical care time: 35 Critical care time was exclusive of separately billable procedures and treating other patients. Critical care was necessary to treat or prevent imminent or life-threatening deterioration. Critical care was time spent personally by me on the following activities: development of treatment plan with patient and/or surrogate as well as nursing, discussions with consultants, evaluation of patient's response to treatment, examination of patient, obtaining history from patient or surrogate, ordering and performing treatments and interventions, ordering and review of laboratory studies, ordering and review of radiographic studies, pulse oximetry and re-evaluation of patient's condition.  MDM   Final diagnoses:  Hyponatremia        Maudry Diego, MD 03/04/2014 1747

## 2014-03-09 NOTE — ED Notes (Signed)
Pt c/o being hot after providing a cool cloth and ice pack. AC notified and bedside fan requested.

## 2014-03-09 NOTE — ED Notes (Signed)
Pt resident at [facility] - reports lab results from today report Na+ 120.  Pt c/o trouble speaking, increased sob and "feeling funny in head."

## 2014-03-09 NOTE — ED Notes (Signed)
Pt moved from Room 15 to room 14 because she did not want to be alone. Curtains open so patient can view staff.

## 2014-03-09 NOTE — ED Notes (Signed)
PT arrived with foley in place.

## 2014-03-09 NOTE — ED Notes (Signed)
CRITICAL VALUE ALERT  Critical value received: Sodium 116  Date of notification: 03/03/2014  Time of notification:  5749  Critical value read back:yes  Nurse who received alert: Di Kindle ,RN  MD notified (1st page): Dr. Roderic Palau  Time of first page:  1647  MD notified (2nd page):  Time of second page:  Responding MD:  Dr. Roderic Palau Time MD responded: [phone]

## 2014-03-09 NOTE — Discharge Summary (Addendum)
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    Altered mental status   Other dysphagia   Hypokalemia   Failure to thrive in adult   Discharge Condition: patient expired on 03/16/2014 at 22:30   [facility] Weights   03/06/2014 1855  Weight: 42.9 kg (94 lb 9.2 oz)    History of present illness:  63 year old female with metastatic poorly differentiated carcinoma which is soft the suspected from the lung versus breast primary with metastasis to the bone, mediastinum, brain, intramedullary thoracic spinal cord involvement with cord compression causing paraplegia and lymphangitic spread currently completed nivolumab ( 3/ 4 cycles ), hypothyroidism, severe malnutrition who was recently hospitalized for a CVA and hyponatremia in the setting of dehydration and severe hypothyroidism and was discharged to a nursing center .  History provided by patient's brother at bedside we informs that patient has been less communicative since yesterday and appears more frail. she also has not been able to swallow and speak properly. patient reports having dysphagia and odynophagia since yesterday. She reports feeling increasingly weak and hot. She also reports headache and chest discomfort. She also reported increased shortness of breath. Course in the ED  patient was tachypneic to 30s, tachycardic to low 100s, o2 sat was in 80s which improved on 2 L [place]. She was afebrile  Blood work done showed a hemoglobin of 9.1 and platelets of 86 .  Chemistry showed sodium of 116, potassium of 3.3, chloride of 78  and blood glucose of 216.  chest x-ray commented as "prominence of perihilar vasculature bilaterally with right greater than left, cannot rule out underlying infection."      Hospital Course:  severe hyponatremia  Admitted to stepdown. Appears likely hyperosmolar hyponatremia with dehydration and poor po intake. May have component of SIADH although this was not obvious on her recent hospitalization.  Placed her on IV fluids. Subsequent Na improved 119 and k dropped to 2.9.   Active Problems:  Dysphagia  Also be secondary to mucositis versus esophageal candidiasis  placed her on empiric IV fluconazole and Magic mouthwash with lidocaine.    Acute encephalopathy  Likely secondary to dehydration and hyponatremia. No clear  sign of infection.    Metastatic breast vs lung cancer  Patient has metastatic breast versus primary lung cancer to the brain and bone, mediastinal lymphangitic and thoracic spinal cord spread with paraplegia. She wished to undergo chemotherapy and has been on nivolumab. Had completed 3/4 cycles so far and is due for her 4th cycle in 2 weeks. Followed with Dr Barnet Glasgow.    Patient wished to be DNR and understood poor prognosis of her illness . She had severe deconditioning and malnutrition. She understood her guarded prognosis however wished to try chemotherapy for now.  Patient was found to be bradycardic to 20s and o2 desaturations on [place]. BP dropped to 75/ 45 and was tachypnic.  A venturi mask was being placed but became pulseless and  Unresponsive suddenly . Telemetry showed bradycardia only.  Patient was pronounced dead at 10:30 pm .  Patient's brother and sister  were present at bedside.     Discharge Exam: Filed Vitals:   03/16/2014 2200  BP: 75/45  Pulse: 72  Temp:   Resp: 28     Discharge Instructions You were cared for by a hospitalist during your hospital stay. If you have any questions about your discharge medications or the care you received while  you were in the hospital after you are discharged, you can call the unit and asked to speak with the hospitalist on call if the hospitalist that took care of you is not available. Once you are discharged, your primary care physician will handle any further medical issues. Please note that NO REFILLS for any discharge medications will be authorized once you are discharged, as it is imperative that you return to your primary care physician (or establish a relationship with a primary care physician if you do not have one) for your aftercare needs so that they can reassess your need for medications and monitor your lab values.   Future Appointments Provider Department Dept Phone   03/13/2014 2:15 PM [place] [phone]   03/19/2014 9:45 AM [place] [phone]   03/19/2014 10:00 AM [place] Covering Provider [place] [phone]       Medication List    ASK your doctor about these medications                                                            No Known Allergies    The results of significant diagnostics from this hospitalization (including imaging, microbiology, ancillary and laboratory) are listed below for reference.    Significant Diagnostic Studies: Dg Chest 2 View  02/14/2014   CLINICAL DATA:  Shortness of breath, metastatic breast cancer.  EXAM: CHEST  2 VIEW  COMPARISON:  DG CHEST 1V PORT dated 01/29/2014; CT CHEST W/CM dated 01/14/2014; CT CHEST W/CM dated 07/03/2013  FINDINGS: Trachea is midline. Left subclavian power port tip is in the SVC. Heart size stable. Pulmonary parenchymal lesions seen on 01/14/2014 are poorly appreciated. No pleural fluid. Surgical clips in the right axilla.  IMPRESSION: Pulmonary parenchymal lesions seen on 01/14/2014 are poorly appreciated on the current exam. No large areas of airspace consolidation. No pleural fluid.   Electronically Signed   By: Lorin Picket M.D.   On: 02/14/2014 20:29   Ct Angio Chest Pe W/cm &/or Wo Cm  02/15/2014   CLINICAL DATA:  Breast cancer. Status post lumpectomy, chemotherapy and radiation. Metastases to lung, bone and brain. Tachycardia, shortness of breath.  EXAM: CT ANGIOGRAPHY CHEST WITH CONTRAST  TECHNIQUE: Multidetector CT imaging of the chest was performed using the standard protocol during bolus administration of intravenous contrast. Multiplanar CT image reconstructions and MIPs were obtained to evaluate the vascular anatomy.  CONTRAST:  158mL OMNIPAQUE IOHEXOL 350 MG/ML SOLN  COMPARISON:  01/14/2014  FINDINGS: Left-sided Port-A-Cath is in place with the tip in the SVC. Heart is borderline in size. There is a small pericardial effusion. Aorta is normal caliber.  6 mm prevascular lymph node on image 25. Abnormal soft tissue in the right paratracheal region is difficult to measure as this appears to be rather diffuse and ill-defined. This has increased since prior study.  Right lower lobe pulmonary nodule  on image 49 measures 7 mm, not significantly changed. Nodular area in the left upper lobe in the perihilar region on image 36 measures 10 mm compared with 9 mm previously, not significantly changed. Areas of peripheral ground-glass airspace opacity noted peripherally in the upper lobes bilaterally, increased since prior study. There are numerous small somewhat ill-defined lower lobe pulmonary nodular areas, many of which are new since prior study. These are small, 3-4 mm or less in size. Marland Kitchen  Postsurgical changes in the right breast and axilla.  Diffuse sclerotic lesions throughout the thoracic spine compatible with metastases. Multiple bilateral rib lesions.  Multiple rim enhancing lesions within the liver. 2.4 cm lesion within the right hepatic lobe compared with 1.8 cm previously. 1.8 cm lesion in the left hepatic lobe on image 69 compared with 8 mm previously. The other lesions in the dome with the liver are not as well  visualized on this Chest CT due to bolus timing.  Review of the MIP images confirms the above findings.  IMPRESSION: New patchy ground-glass airspace opacities within the upper lobes bilaterally peripherally. There are also numerous small ill-defined subcentimeric pulmonary nodules in the lower lobes. Previously seen nodular densities in the lungs are relatively stable. Overall process in the lungs could reflect lymphangitic spread of tumor, atypical infections or drug toxicity. Pulmonary nodularity scratch head the pulmonary nodules are concerning for metastases.  Small pericardial effusion.  Ill-defined abnormal soft tissue in the mediastinum, predominantly in the paratracheal region, difficult to measure due to its indistinct appearance.  Slight enlargement of at least 2 hepatic metastases.  Stable diffuse bony metastases.   Electronically Signed   By: Rolm Baptise M.D.   On: 02/15/2014 20:09   Dg Chest Portable 1 View  03/16/2014   CLINICAL DATA:  Shortness of breath.  EXAM: PORTABLE CHEST - 1 VIEW  COMPARISON:  02/14/2014  FINDINGS: Left subclavian Port-A-Cath is unchanged. Lungs are adequately inflated and demonstrate moderate prominence of the perihilar markings bilaterally right slightly worse than left likely interstitial edema, although cannot exclude infection. No evidence of effusion. Cardiomediastinal silhouette and remainder the exam is unchanged. Marland Kitchen  IMPRESSION: Mild prominence of the perihilar vasculature bilaterally right worse than left likely asymmetric edema, although cannot exclude infection.   Electronically Signed   By: Marin Olp M.D.   On: 03/01/2014 16:02    Microbiology: No results found for this or any previous visit (from the past 240 hour(s)).   Labs: Basic Metabolic Panel:  Recent Labs Lab 03/05/14 0945 03/25/2014 1547 03/03/2014 2000 03/23/2014 2211  NA 128* 116* 130* 119*  K 4.2 3.3* 2.2* 2.9*  CL 91* 78* 101 81*  CO2 23 27 20 31   GLUCOSE 125* 216* 128* 210*  BUN 7  6 4* 6  CREATININE 0.25* <0.20* <0.20* <0.20*  CALCIUM 8.2* 8.6 4.8* 8.1*  MG  --  1.9  --   --    Liver Function Tests:  Recent Labs Lab 03/05/14 0945 03/18/2014 1547  AST 45* 59*  ALT 58* 75*  ALKPHOS 366* 398*  BILITOT 0.5 0.7  PROT 6.5 6.7  ALBUMIN 2.6* 2.7*   No results found for this basename: LIPASE, AMYLASE,  in the last 168 hours No results found for this basename: AMMONIA,  in the last 168 hours CBC:  Recent Labs Lab 03/05/14 0945 02/28/2014 1547  WBC 8.1 8.2  NEUTROABS 6.4 6.6  HGB 9.6* 9.1*  HCT 27.5* 26.1*  MCV 81.4 78.9  PLT 105* 86*   Cardiac  Enzymes: No results found for this basename: CKTOTAL, CKMB, CKMBINDEX, TROPONINI,  in the last 168 hours BNP: BNP (last 3 results)  Recent Labs  02/14/14 1946 03/24/2014 1547  PROBNP 263.1* 451.8*   CBG: No results found for this basename: GLUCAP,  in the last 168 hours     Signed:  Vernadine Coombs  [facility] 03/01/2014, 10:52 PM

## 2014-03-09 NOTE — Progress Notes (Signed)
CRITICAL VALUE ALERT  Critical value received:  K 2.2 Ca 4.8  Date of notification:  03/08/2014  Time of notification:  2140  Critical value read back: yes  Nurse who received alert:  Candiss Norse RN  MD notified (1st page):  Dhungel, N  Time of first page:  2150  MD notified (2nd page):  Time of second page:  Responding MD:  Dhungel, N.  Time MD responded:  2153  Will redraw blood

## 2014-03-09 NOTE — ED Notes (Signed)
Hospitalist at bedside to see pt.

## 2014-03-09 NOTE — H&P (Addendum)
[facility] History and Physical  BEUNA BOLDING DTO:[id number] DOB: April 16, 1951 DOA: 03/06/2014  Referring physician: Dr. 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 History provided by patient's brother at bedside we informs that patient has been less communicative since yesterday and appears more frail.  she also has not been able to swallow and speak properly.  patient reports having dysphagia and odynophagia since yesterday. She reports feeling increasingly weak and hot. She also reports headache and chest discomfort. She also reports increased shortness of breath.  Patient denies , fever, chills, nausea , vomiting,  palpitations,  abdominal pain, bowel or urinary symptoms.   Course in the ED  patient was tachypneic to 30s, tachycardic to low 100s, o2 sat was in 80s which improved on 2 L . She was afebrile  Blood work done showed a hemoglobin of 9.1 and platelets of 86 . Chemistry showed sodium of 116, potassium of 3.3, chloride of 78 and blood glucose of 216.   chest x-ray commented  as "prominence of perihilar vasculature bilaterally with right greater than left, cannot rule out underlying infection."  Review of Systems:  Constitutional: Denies fever, chills, diaphoresis, appetite change and fatigue.  HEENT:  difficulty swallowing, Denies photophobia, eye pain, redness,  hearing loss, ear pain, congestion, sore throat, rhinorrhea, sneezing, mouth sores,  neck pain, neck stiffness and tinnitus. Respiratory:  SOB, DOE, cough, chest tightness,  wheezing.   Cardiovascular:  chest pain, denies palpitations and leg swelling.  Gastrointestinal: Denies nausea, vomiting, abdominal pain, diarrhea, constipation, blood in stool and abdominal distention.  Genitourinary: Denies dysuria, urgency, frequency, hematuria, flank pain and difficulty urinating.  Endocrine: reports feeling hot and sweaty.  Musculoskeletal: Denies myalgias, back pain, joint swelling, arthralgias and gait problem.  Skin: Denies pallor, rash and wound.  Neurological: weakness, headaches, Denies dizziness, seizures, syncope,  light-headedness, numbness and headaches.   Psychiatric/Behavioral: confusion,    Past Medical History  Diagnosis Date  . 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 Hypothyroid   . Enlarged lymph nodes   . 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  . S/P radiation therapy 12/26/01- 02/12/02     Right breast/regional lymph nodes/ 5040 cGy / 28 fractions/ bost of 1200 cGy/ 6 Fractions  . Status post chemotherapy     6 cycles of CMF completed on 12/12/2001  . Right shoulder pain     started late 2012  . Dry cough     intermittent  . SOB (shortness of breath)   . Use of letrozole (Femara) 03/14/2013  . Cholelithiasis   . Anemia   . BREAST CANCER 04/2001    s/p radiation therapy, chemotherapy and lumpectomy  . Metastasis to bone 03/13/13    PET scan results  . Metastasis to bone     breast primary  . Cancer     brain mets  . Lung cancer 10/08/2013  .  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 Stroke   . Hx of radiation therapy 12/25/13- 01/09/14    whole brain 3000 cGy in 10 fractions   Past Surgical History  Procedure Laterality Date  . Tonsillectomy    . Breast lumpectomy   04/2001    excision of L breast fibroadenoma by Dr.   . Breast lumpectomy  05/16/2001    Right axillary sentinel lymph node biopsy.  . Portacath placement  09/19/2012    Procedure: INSERTION PORT-A-CATH;  Surgeon: Haywood Lasso, MD;  Location: [facility];  Service: General;  Laterality: N/A;  . Lipoma excision Right 05/15/2013    Procedure: RIGHT AXILLARY LYMPH NODE DISSECTION;  Surgeon: Haywood Lasso, MD;  Location: [facility];  Service: General;  Laterality: Right;   Social History:  reports that she has never smoked. She has never used smokeless tobacco. She reports that she does not drink alcohol or use illicit drugs.  No Known Allergies  Family History  Problem Relation Age of Onset  . Diabetes Mother   . Hypertension Father   . Hypertension Sister   . Hypothyroidism Brother   . Cancer Neg Hx     Prior to Admission medications   Medication Sig Start Date End Date Taking? Authorizing Provider  levothyroxine (SYNTHROID, LEVOTHROID) 200 MCG tablet Take 200 mcg by mouth daily.   Yes Historical Provider, MD  potassium chloride 20 MEQ/15ML (10%) solution Take 20 mEq by mouth daily.   Yes Historical Provider, MD  acetaminophen (TYLENOL) 325 MG tablet Take 2 tablets (650 mg total) by mouth every 6 (six) hours as needed for moderate pain, fever or headache. 01/28/14   Venetia Maxon Rama, MD  alum & mag hydroxide-simeth (MAALOX/MYLANTA) 200-200-20 MG/5ML suspension Take 15 mLs by mouth every 4 (four) hours as needed for indigestion or heartburn. 01/28/14   Venetia Maxon Rama, MD  B Complex-C (B-COMPLEX WITH VITAMIN C) tablet Take 1 tablet by mouth daily.    Historical Provider, MD  calcium-vitamin D (OSCAL WITH D) 500-200 MG-UNIT per tablet Take 2 tablets by mouth daily with breakfast. 02/14/14   Baird Cancer, PA-C  diazepam (DIAZEPAM INTENSOL) 5 MG/ML solution Give 73ml by mouth every 6 hours as needed for anxiety or sleep 02/21/14   Blanchie Serve, MD  feeding supplement, RESOURCE BREEZE,  (RESOURCE BREEZE) LIQD Take 1 Container by mouth 3 (three) times daily between meals. 01/28/14   Venetia Maxon Rama, MD  gabapentin (NEURONTIN) 100 MG capsule Take 1 capsule (100 mg total) by mouth 2 (two) times daily. 01/28/14   Venetia Maxon Rama, MD  HYDROcodone-acetaminophen (NORCO/VICODIN) 5-325 MG per tablet Take 1 tablet by mouth every 4 (four) hours as needed for moderate pain. 02/21/14   Blanchie Serve, MD  hydrocortisone (CORTEF) 10 MG tablet Take 2 tablets (20 mg total) by mouth 2 (two) times daily. 02/03/14   Theodis Blaze, MD  lidocaine-prilocaine (EMLA) cream Apply topically as needed. Apply to port 1-2 hours before procedure 09/18/12   Eston Esters, MD  magnesium citrate SOLN Take 296 mLs (1 Bottle total) by mouth daily as needed for severe constipation. 01/28/14   Venetia Maxon Rama, MD  Multiple Vitamin (MULTIVITAMIN WITH MINERALS) TABS Take 1 tablet by mouth daily.    Historical Provider, MD  prochlorperazine (COMPAZINE) 10 MG tablet Take 1 tablet (10 mg total) by mouth every 6 (six) hours as needed for nausea or vomiting. 02/03/14   Theodis Blaze, MD  senna-docusate (SENOKOT-S) 8.6-50  MG per tablet Take 2 tablets by mouth at bedtime as needed for mild constipation. 01/28/14   Venetia Maxon Rama, MD  sodium phosphate (FLEET) 7-19 GM/118ML ENEM Place 1 enema rectally daily as needed for severe constipation. 01/28/14   Venetia Maxon Rama, MD     Physical Exam:  Filed Vitals:   03/25/2014 1706 03/07/2014 1730 03/02/2014 1752 03/14/2014 1753  BP: 156/90 133/91 133/91 133/91  Pulse: 86 87 84 103  Temp:      TempSrc:      Resp: 33 34 32 33  SpO2: 88%  93% 93%    Constitutional: Vital signs reviewed. Patient is an elderly cachectic, frail female lying in bed appears in distress HEENT: Pallor present,, no icterus, dry oral mucosa with coated posterior pharyngeal wall. No thrush, , no cervical lymphadenopathy Cardiovascular: S1 and S2 tachycardic, no MRG Chest:  coarse breath sounds bilaterally, scattered  rhonchi Abdominal: Soft. Non-tender, non-distended, bowel sounds are normal, Foley in place Ext: warm, no edema Neurological: Alert and awake, impaired speech with hoarse voice, frail  Labs on Admission:  Basic Metabolic Panel:  Recent Labs Lab 03/05/14 0945 03/06/2014 1547  NA 128* 116*  K 4.2 3.3*  CL 91* 78*  CO2 23 27  GLUCOSE 125* 216*  BUN 7 6  CREATININE 0.25* <0.20*  CALCIUM 8.2* 8.6   Liver Function Tests:  Recent Labs Lab 03/05/14 0945 03/20/2014 1547  AST 45* 59*  ALT 58* 75*  ALKPHOS 366* 398*  BILITOT 0.5 0.7  PROT 6.5 6.7  ALBUMIN 2.6* 2.7*   No results found for this basename: LIPASE, AMYLASE,  in the last 168 hours No results found for this basename: AMMONIA,  in the last 168 hours CBC:  Recent Labs Lab 03/05/14 0945 03/22/2014 1547  WBC 8.1 8.2  NEUTROABS 6.4 6.6  HGB 9.6* 9.1*  HCT 27.5* 26.1*  MCV 81.4 78.9  PLT 105* 86*   Cardiac Enzymes: No results found for this basename: CKTOTAL, CKMB, CKMBINDEX, TROPONINI,  in the last 168 hours BNP: No components found with this basename: POCBNP,  CBG: No results found for this basename: GLUCAP,  in the last 168 hours  Radiological Exams on Admission: Dg Chest Portable 1 View  03/24/2014   CLINICAL DATA:  Shortness of breath.  EXAM: PORTABLE CHEST - 1 VIEW  COMPARISON:  02/14/2014  FINDINGS: Left subclavian Port-A-Cath is unchanged. Lungs are adequately inflated and demonstrate moderate prominence of the perihilar markings bilaterally right slightly worse than left likely interstitial edema, although cannot exclude infection. No evidence of effusion. Cardiomediastinal silhouette and remainder the exam is unchanged. Marland Kitchen  IMPRESSION: Mild prominence of the perihilar vasculature bilaterally right worse than left likely asymmetric edema, although cannot exclude infection.   Electronically Signed   By: Marin Olp M.D.   On: 03/07/2014 16:02     Assessment/Plan  Principal Problem:   severe  hyponatremia Admit to stepdown. Appears likely  hyperosmolar hyponatremia with dehydration and poor po intake. May have component of SIADH although this did not local obvious on her recent hospitalization. She also had markedly elevated TSH which could be contributing to it. -I will place her on D5 half normal saline and monitor her sodium every 6 hours. Check serum osmolality, urine osmolality, and urine sodium. Her recent TSH was 6.69.  Active Problems:  Dysphagia Also be secondary to mucositis versus esophageal candidiasis Will place her on empiric IV fluconazole and Magic mouthwash with lidocaine. -made NPO with sips of water with medications. Will  start clear liquids and once tolerated.   Acute encephalopathy Likely secondary to dehydration and hyponatremia. No clear sign of infection. We'll monitor with improvement in sodium. Maintain seizure precautions.  Metastatic breast vs lung cancer Patient has metastatic breast versus primary lung cancer to the brain and bone, mediastinal lymphangitic and thoracic spinal cord spread with paraplegia. She wished to undergo chemotherapy and has been on nivolumab. Has completed 3/4 cycles so far and is due for  her 4th cycle in 2 weeks. Follows with Dr Barnet Glasgow.  -She wishes to undergo chemotherapy at this time and see if it improves her symptoms.    HYPOTHYROIDISM, UNSPECIFIED Continue TSH at current dose. Level has been reduced to 6.69 recently.     Anemia and thrombocytopenia Likely secondary to malignancy. Hemoglobin and platelets at baseline. Monitor.    Weakness Secondary to metastatic malignancy, malnutrition and failure to thrive.      Hypokalemia Replenish with IV fluids. Check magnesium level.        Diet: N.p.o. except for meds for now. Start clears if tolerated.  DVT prophylaxis: SCD   Code Status: DNR, family would not like any aggressive measures. On my detailed discussion with them and the patient they agree that  she has poor prognosis and would like to keep her comfortable. Patient is unable to communicate well at this time but she understands her poor prognosis. She has wished to continue chemotherapy for now and is due for her fourth cycle within 2 weeks and wants to wait and see if there is any improvement. She agrees on options for hospice if symptoms do not improve over time.  Family Communication: discussed with brother and sister-in-law at bedside  Disposition Plan: Admit to stepdown. Return to skilled nursing facility once stable.  Breken Nazari [facility] Pager [phone]  Total time spent on admission :70 minutes  If 7PM-7AM, please contact night-coverage [email or web address] Password [facility] 03/26/2014, 6:47 PM

## 2014-03-10 ENCOUNTER — Other Ambulatory Visit (HOSPITAL_COMMUNITY): Payer: Self-pay | Admitting: Oncology

## 2014-03-10 LAB — OSMOLALITY: Osmolality: 248 mOsm/kg — ABNORMAL LOW (ref 275–300)

## 2014-03-13 ENCOUNTER — Ambulatory Visit (HOSPITAL_COMMUNITY): Payer: Self-pay

## 2014-03-13 NOTE — Progress Notes (Signed)
This encounter was created in error - please disregard.

## 2014-03-19 ENCOUNTER — Inpatient Hospital Stay (HOSPITAL_COMMUNITY): Payer: Self-pay

## 2014-03-19 ENCOUNTER — Ambulatory Visit (HOSPITAL_COMMUNITY): Payer: Self-pay

## 2014-03-20 NOTE — Care Management Utilization Note (Signed)
UR completed 

## 2014-03-31 NOTE — Progress Notes (Signed)
Patient left floor via stretcher with [facility], family accompanied with patient and staff.

## 2014-03-31 NOTE — Progress Notes (Signed)
Patient transported by [facility] via [facility] and family per spiritual request.  Patient's belonging taken home by her Brother and Sister from the [facility].  The family was able to get in-touch with Tia Masker our house chaplin prior to leaving.

## 2014-03-31 NOTE — Progress Notes (Signed)
Patient was having difficulty swallowing at the beginning of the shift; MD change PO medications to IV due to her current status of NPO.  The patient was give biotene to swab her mouth with and moisture gel swabbed to lips.  Later the patient's respiratory rate increased to the high 40's and was later give anti-anxiety medication to relax her RR and breathing.  The patient did relax and was asleep with eyes closed shortly after.  The patient the went bradycardic and then within 2-3 minutes of idioventricular rhythm.  The patient was pronounced dead at 2229/01/12 by D. Brewer/T. Therapist, sports.  Family at bedside.  Spiritual arrangements met per family instructions.  Dr. Clementeen Graham informed and met with the family. House supervisor and bed control informed of the status.

## 2014-03-31 DEATH — deceased

## 2014-09-01 ENCOUNTER — Encounter (HOSPITAL_COMMUNITY): Payer: Self-pay | Admitting: Emergency Medicine

## 2014-10-17 IMAGING — CT CT ANGIO CHEST
2 of 6 series · 6 of 36 positions shown · IV contrast (Omnipaque 300)
Comparison: 01/14/2014

CLINICAL DATA: Breast cancer. Status post lumpectomy, chemotherapy
and radiation. Metastases to lung, bone and brain. Tachycardia,
shortness of breath.

EXAM:
CT ANGIOGRAPHY CHEST WITH CONTRAST
TECHNIQUE: Multidetector CT imaging of the chest was performed using the
standard protocol during bolus administration of intravenous
contrast. Multiplanar CT image reconstructions and MIPs were
obtained to evaluate the vascular anatomy.
CONTRAST:  100mL OMNIPAQUE IOHEXOL 350 MG/ML SOLN

[Series 6: pe 3.0 b40f · axial · 0.52mm/px · z∈[-204,-42]mm · 5 of 82 slices shown]
[im 14/82  lung]
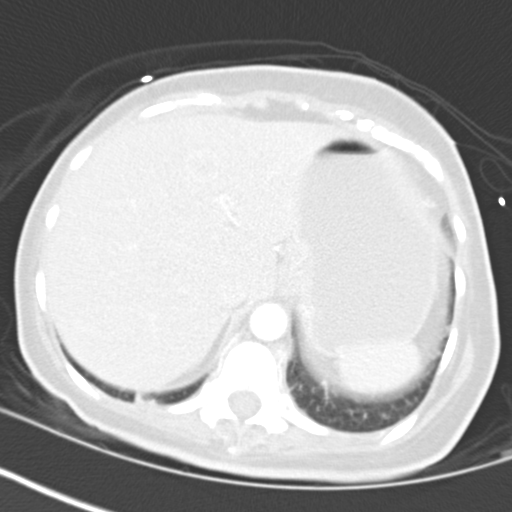
[im 28/82  mediastinal]
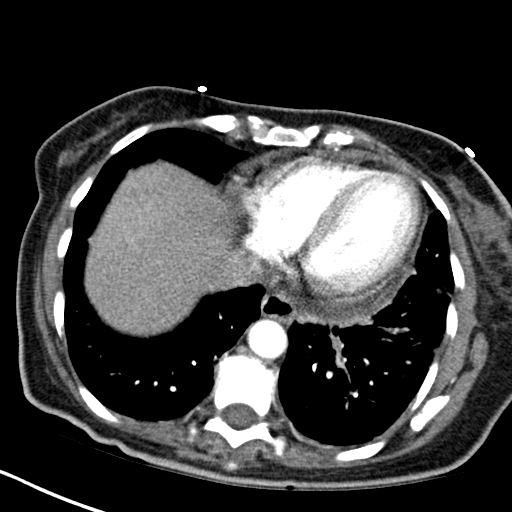
[im 41/82  lung]
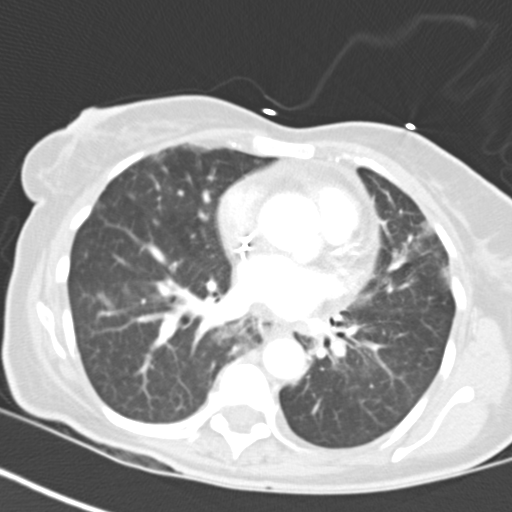
[im 55/82  mediastinal]
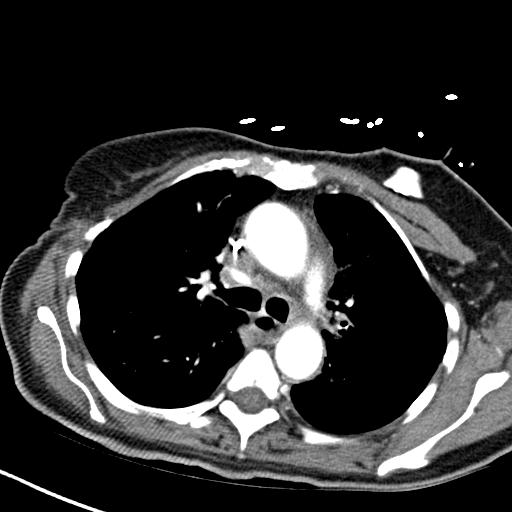
[im 68/82  lung]
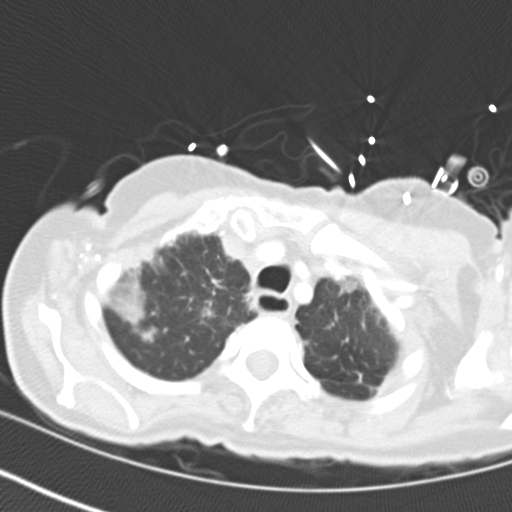

[Series 8: mpr coronal pe 3mm · coronal · 0.45mm/px · 1 of 63 slices shown]
[im 32/63  mediastinal]
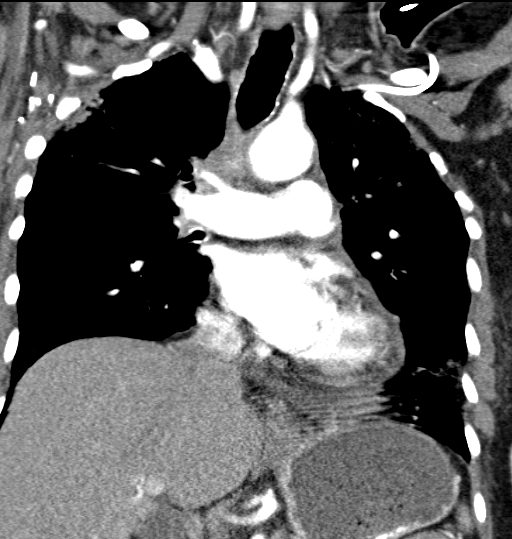

[6 of 36 positions shown; findings below may reference images not displayed]

FINDINGS: Left-sided Port-A-Cath is in place with the tip in the SVC. Heart is
borderline in size. There is a small pericardial effusion. Aorta is
normal caliber.

6 mm prevascular lymph node on image 25. Abnormal soft tissue in the
right paratracheal region is difficult to measure as this appears to
be rather diffuse and ill-defined. This has increased since prior
study.

Right lower lobe pulmonary nodule on image 49 measures 7 mm, not
significantly changed. Nodular area in the left upper lobe in the
perihilar region on image 36 measures 10 mm compared with 9 mm
previously, not significantly changed. Areas of peripheral
ground-glass airspace opacity noted peripherally in the upper lobes
bilaterally, increased since prior study. There are numerous small
somewhat ill-defined lower lobe pulmonary nodular areas, many of
which are new since prior study. These are small, 3-4 mm or less in
size. .

Postsurgical changes in the right breast and axilla.

Diffuse sclerotic lesions throughout the thoracic spine compatible
with metastases. Multiple bilateral rib lesions.

Multiple rim enhancing lesions within the liver. 2.4 cm lesion
within the right hepatic lobe compared with 1.8 cm previously.
cm lesion in the left hepatic lobe on image 69 compared with 8 mm
previously. The other lesions in the dome with the liver are not as
well visualized on this Chest CT due to bolus timing.

Review of the MIP images confirms the above findings.
IMPRESSION: New patchy ground-glass airspace opacities within the upper lobes
bilaterally peripherally. There are also numerous small ill-defined
subcentimeric pulmonary nodules in the lower lobes. Previously seen
nodular densities in the lungs are relatively stable. Overall
process in the lungs could reflect lymphangitic spread of tumor,
atypical infections or drug toxicity. Pulmonary nodularity scratch
head the pulmonary nodules are concerning for metastases.

Small pericardial effusion.

Ill-defined abnormal soft tissue in the mediastinum, predominantly
in the paratracheal region, difficult to measure due to its
indistinct appearance.

Slight enlargement of at least 2 hepatic metastases.

Stable diffuse bony metastases.

## 2014-11-08 IMAGING — CR DG CHEST 1V PORT
1 series · 1 of 1 positions shown · non-contrast
Comparison: 02/14/2014

CLINICAL DATA: Shortness of breath.

EXAM:
PORTABLE CHEST - 1 VIEW

[portable]
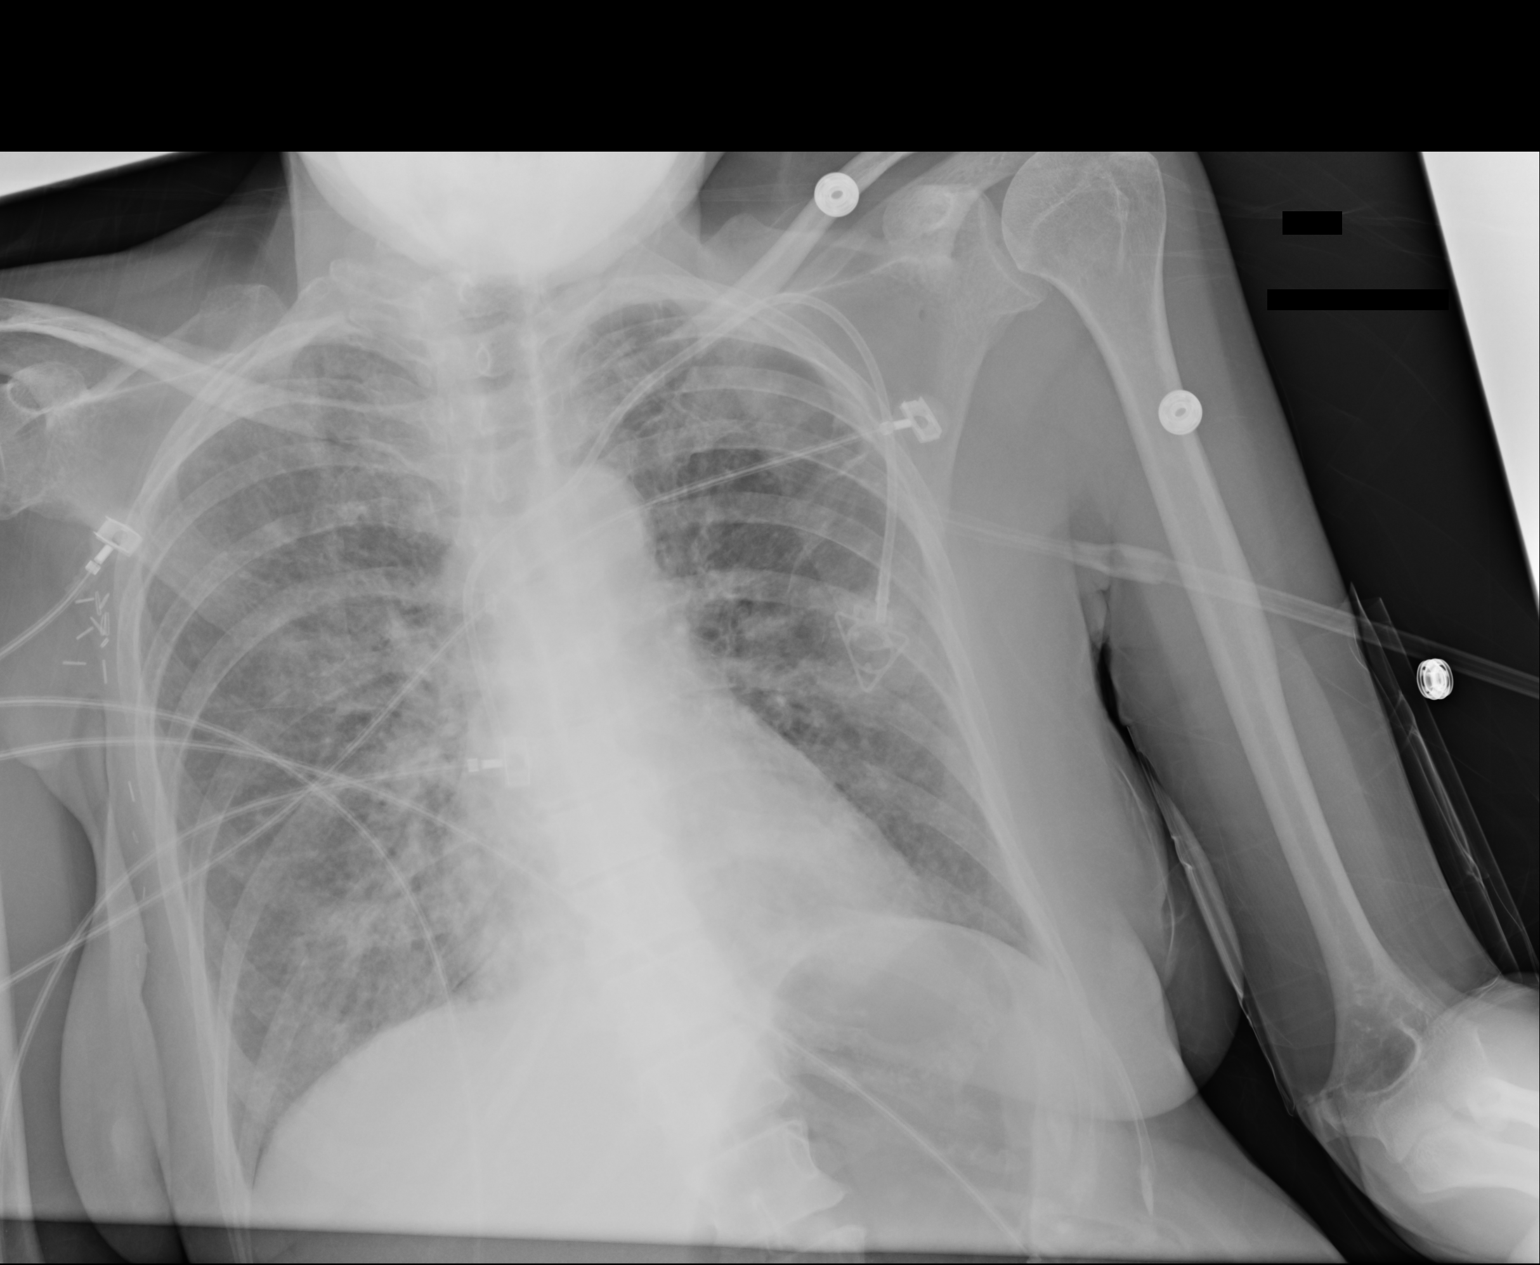

[1 of 1 positions shown; findings below may reference images not displayed]

FINDINGS: Left subclavian Port-A-Cath is unchanged. Lungs are adequately
inflated and demonstrate moderate prominence of the perihilar
markings bilaterally right slightly worse than left likely
interstitial edema, although cannot exclude infection. No evidence
of effusion. Cardiomediastinal silhouette and remainder the exam is
unchanged. .
IMPRESSION: Mild prominence of the perihilar vasculature bilaterally right worse
than left likely asymmetric edema, although cannot exclude
infection.
